# Patient Record
Sex: Female | Born: 1940
Health system: Southern US, Community
[De-identification: ages and names within clinical notes are randomized; demographics above are authoritative.]

## PROBLEM LIST (undated history)

## (undated) ENCOUNTER — Emergency Department (HOSPITAL_COMMUNITY): Admission: EM | Payer: Medicare Other | Source: Home / Self Care

## (undated) DIAGNOSIS — M549 Dorsalgia, unspecified: Secondary | ICD-10-CM

## (undated) DIAGNOSIS — R002 Palpitations: Secondary | ICD-10-CM

## (undated) DIAGNOSIS — R42 Dizziness and giddiness: Secondary | ICD-10-CM

## (undated) DIAGNOSIS — E785 Hyperlipidemia, unspecified: Secondary | ICD-10-CM

## (undated) DIAGNOSIS — D649 Anemia, unspecified: Secondary | ICD-10-CM

## (undated) DIAGNOSIS — E119 Type 2 diabetes mellitus without complications: Secondary | ICD-10-CM

## (undated) DIAGNOSIS — M1990 Unspecified osteoarthritis, unspecified site: Secondary | ICD-10-CM

## (undated) DIAGNOSIS — Z9289 Personal history of other medical treatment: Secondary | ICD-10-CM

## (undated) DIAGNOSIS — I10 Essential (primary) hypertension: Secondary | ICD-10-CM

## (undated) DIAGNOSIS — C9110 Chronic lymphocytic leukemia of B-cell type not having achieved remission: Secondary | ICD-10-CM

## (undated) DIAGNOSIS — Z9889 Other specified postprocedural states: Secondary | ICD-10-CM

## (undated) DIAGNOSIS — Z5189 Encounter for other specified aftercare: Secondary | ICD-10-CM

## (undated) HISTORY — DX: Encounter for other specified aftercare: Z51.89

## (undated) HISTORY — DX: Other specified postprocedural states: Z98.890

## (undated) HISTORY — PX: ABDOMINAL HYSTERECTOMY: SHX81

## (undated) HISTORY — DX: Personal history of other medical treatment: Z92.89

## (undated) HISTORY — DX: Hyperlipidemia, unspecified: E78.5

## (undated) HISTORY — DX: Anemia, unspecified: D64.9

## (undated) HISTORY — DX: Palpitations: R00.2

## (undated) HISTORY — DX: Chronic lymphocytic leukemia of B-cell type not having achieved remission: C91.10

## (undated) HISTORY — PX: COLON SURGERY: SHX602

## (undated) HISTORY — DX: Dizziness and giddiness: R42

---

## 1996-11-11 ENCOUNTER — Encounter (INDEPENDENT_AMBULATORY_CARE_PROVIDER_SITE_OTHER): Payer: Self-pay | Admitting: *Deleted

## 1996-11-11 LAB — CONVERTED CEMR LAB

## 1997-07-21 ENCOUNTER — Encounter: Admission: RE | Admit: 1997-07-21 | Discharge: 1997-07-21 | Payer: Self-pay | Admitting: Sports Medicine

## 1997-08-03 ENCOUNTER — Encounter: Admission: RE | Admit: 1997-08-03 | Discharge: 1997-08-03 | Payer: Self-pay | Admitting: Family Medicine

## 1997-09-30 ENCOUNTER — Encounter: Admission: RE | Admit: 1997-09-30 | Discharge: 1997-09-30 | Payer: Self-pay | Admitting: Family Medicine

## 1997-10-31 ENCOUNTER — Encounter: Admission: RE | Admit: 1997-10-31 | Discharge: 1997-10-31 | Payer: Self-pay | Admitting: Family Medicine

## 1997-11-29 ENCOUNTER — Encounter: Admission: RE | Admit: 1997-11-29 | Discharge: 1997-11-29 | Payer: Self-pay | Admitting: Sports Medicine

## 1997-12-09 ENCOUNTER — Encounter: Admission: RE | Admit: 1997-12-09 | Discharge: 1997-12-09 | Payer: Self-pay | Admitting: Family Medicine

## 1997-12-19 ENCOUNTER — Encounter: Admission: RE | Admit: 1997-12-19 | Discharge: 1997-12-19 | Payer: Self-pay | Admitting: Family Medicine

## 1998-01-12 ENCOUNTER — Encounter: Admission: RE | Admit: 1998-01-12 | Discharge: 1998-01-12 | Payer: Self-pay | Admitting: Family Medicine

## 1998-01-23 ENCOUNTER — Encounter: Admission: RE | Admit: 1998-01-23 | Discharge: 1998-01-23 | Payer: Self-pay | Admitting: Sports Medicine

## 1998-03-21 ENCOUNTER — Encounter: Admission: RE | Admit: 1998-03-21 | Discharge: 1998-03-21 | Payer: Self-pay | Admitting: Sports Medicine

## 1998-03-24 ENCOUNTER — Encounter: Admission: RE | Admit: 1998-03-24 | Discharge: 1998-03-24 | Payer: Self-pay | Admitting: Family Medicine

## 1998-04-11 ENCOUNTER — Ambulatory Visit (HOSPITAL_COMMUNITY): Admission: RE | Admit: 1998-04-11 | Discharge: 1998-04-11 | Payer: Self-pay | Admitting: Sports Medicine

## 1998-04-11 ENCOUNTER — Encounter: Admission: RE | Admit: 1998-04-11 | Discharge: 1998-04-11 | Payer: Self-pay | Admitting: Sports Medicine

## 1998-07-07 ENCOUNTER — Encounter: Admission: RE | Admit: 1998-07-07 | Discharge: 1998-07-07 | Payer: Self-pay | Admitting: Family Medicine

## 1998-07-25 ENCOUNTER — Encounter: Admission: RE | Admit: 1998-07-25 | Discharge: 1998-07-25 | Payer: Self-pay | Admitting: Family Medicine

## 1998-07-27 ENCOUNTER — Encounter: Admission: RE | Admit: 1998-07-27 | Discharge: 1998-07-27 | Payer: Self-pay | Admitting: Family Medicine

## 1998-08-10 ENCOUNTER — Encounter: Admission: RE | Admit: 1998-08-10 | Discharge: 1998-08-10 | Payer: Self-pay | Admitting: Family Medicine

## 1998-11-02 ENCOUNTER — Encounter: Admission: RE | Admit: 1998-11-02 | Discharge: 1998-11-02 | Payer: Self-pay | Admitting: Family Medicine

## 1999-01-09 ENCOUNTER — Encounter: Payer: Self-pay | Admitting: Emergency Medicine

## 1999-01-09 ENCOUNTER — Emergency Department (HOSPITAL_COMMUNITY): Admission: EM | Admit: 1999-01-09 | Discharge: 1999-01-09 | Payer: Self-pay | Admitting: Emergency Medicine

## 1999-01-12 ENCOUNTER — Emergency Department (HOSPITAL_COMMUNITY): Admission: EM | Admit: 1999-01-12 | Discharge: 1999-01-12 | Payer: Self-pay | Admitting: Emergency Medicine

## 1999-01-12 ENCOUNTER — Encounter: Payer: Self-pay | Admitting: Emergency Medicine

## 1999-02-28 ENCOUNTER — Encounter: Payer: Self-pay | Admitting: Emergency Medicine

## 1999-02-28 ENCOUNTER — Emergency Department (HOSPITAL_COMMUNITY): Admission: EM | Admit: 1999-02-28 | Discharge: 1999-02-28 | Payer: Self-pay | Admitting: Emergency Medicine

## 1999-03-02 ENCOUNTER — Encounter: Admission: RE | Admit: 1999-03-02 | Discharge: 1999-03-02 | Payer: Self-pay | Admitting: Family Medicine

## 1999-03-07 ENCOUNTER — Encounter: Admission: RE | Admit: 1999-03-07 | Discharge: 1999-03-07 | Payer: Self-pay | Admitting: Family Medicine

## 1999-03-09 ENCOUNTER — Encounter (INDEPENDENT_AMBULATORY_CARE_PROVIDER_SITE_OTHER): Payer: Self-pay

## 1999-03-09 ENCOUNTER — Ambulatory Visit (HOSPITAL_COMMUNITY): Admission: RE | Admit: 1999-03-09 | Discharge: 1999-03-09 | Payer: Self-pay

## 1999-03-15 ENCOUNTER — Encounter: Admission: RE | Admit: 1999-03-15 | Discharge: 1999-03-15 | Payer: Self-pay | Admitting: Family Medicine

## 1999-04-03 ENCOUNTER — Encounter: Admission: RE | Admit: 1999-04-03 | Discharge: 1999-04-03 | Payer: Self-pay | Admitting: Sports Medicine

## 1999-04-06 ENCOUNTER — Encounter: Admission: RE | Admit: 1999-04-06 | Discharge: 1999-04-06 | Payer: Self-pay | Admitting: Family Medicine

## 1999-04-16 ENCOUNTER — Encounter: Admission: RE | Admit: 1999-04-16 | Discharge: 1999-07-15 | Payer: Self-pay

## 1999-04-23 ENCOUNTER — Encounter: Admission: RE | Admit: 1999-04-23 | Discharge: 1999-04-23 | Payer: Self-pay | Admitting: Family Medicine

## 1999-04-25 ENCOUNTER — Encounter: Admission: RE | Admit: 1999-04-25 | Discharge: 1999-04-25 | Payer: Self-pay | Admitting: Family Medicine

## 1999-04-27 ENCOUNTER — Encounter: Admission: RE | Admit: 1999-04-27 | Discharge: 1999-04-27 | Payer: Self-pay | Admitting: Family Medicine

## 1999-05-08 ENCOUNTER — Encounter: Admission: RE | Admit: 1999-05-08 | Discharge: 1999-05-08 | Payer: Self-pay | Admitting: Sports Medicine

## 1999-05-10 ENCOUNTER — Encounter: Admission: RE | Admit: 1999-05-10 | Discharge: 1999-05-10 | Payer: Self-pay | Admitting: Family Medicine

## 1999-05-29 ENCOUNTER — Encounter: Payer: Self-pay | Admitting: Sports Medicine

## 1999-05-29 ENCOUNTER — Encounter: Admission: RE | Admit: 1999-05-29 | Discharge: 1999-05-29 | Payer: Self-pay

## 1999-06-06 ENCOUNTER — Encounter: Admission: RE | Admit: 1999-06-06 | Discharge: 1999-06-06 | Payer: Self-pay | Admitting: Family Medicine

## 1999-06-07 ENCOUNTER — Encounter: Admission: RE | Admit: 1999-06-07 | Discharge: 1999-06-07 | Payer: Self-pay

## 1999-06-07 ENCOUNTER — Encounter: Payer: Self-pay | Admitting: Sports Medicine

## 1999-06-21 ENCOUNTER — Encounter: Admission: RE | Admit: 1999-06-21 | Discharge: 1999-06-21 | Payer: Self-pay | Admitting: Family Medicine

## 1999-07-11 ENCOUNTER — Ambulatory Visit (HOSPITAL_COMMUNITY): Admission: RE | Admit: 1999-07-11 | Discharge: 1999-07-11 | Payer: Self-pay | Admitting: Family Medicine

## 1999-07-30 ENCOUNTER — Inpatient Hospital Stay (HOSPITAL_COMMUNITY): Admission: EM | Admit: 1999-07-30 | Discharge: 1999-08-01 | Payer: Self-pay | Admitting: Emergency Medicine

## 1999-07-30 ENCOUNTER — Encounter: Payer: Self-pay | Admitting: Emergency Medicine

## 1999-07-31 ENCOUNTER — Encounter: Payer: Self-pay | Admitting: Family Medicine

## 1999-08-10 ENCOUNTER — Encounter: Admission: RE | Admit: 1999-08-10 | Discharge: 1999-08-10 | Payer: Self-pay | Admitting: Family Medicine

## 1999-09-21 ENCOUNTER — Encounter: Admission: RE | Admit: 1999-09-21 | Discharge: 1999-09-21 | Payer: Self-pay | Admitting: Family Medicine

## 1999-10-11 ENCOUNTER — Emergency Department (HOSPITAL_COMMUNITY): Admission: EM | Admit: 1999-10-11 | Discharge: 1999-10-11 | Payer: Self-pay | Admitting: Emergency Medicine

## 1999-10-15 ENCOUNTER — Encounter: Admission: RE | Admit: 1999-10-15 | Discharge: 1999-10-15 | Payer: Self-pay | Admitting: Family Medicine

## 1999-11-08 ENCOUNTER — Encounter: Admission: RE | Admit: 1999-11-08 | Discharge: 1999-11-08 | Payer: Self-pay | Admitting: Family Medicine

## 1999-12-19 ENCOUNTER — Emergency Department (HOSPITAL_COMMUNITY): Admission: EM | Admit: 1999-12-19 | Discharge: 1999-12-19 | Payer: Self-pay | Admitting: Emergency Medicine

## 2000-01-10 ENCOUNTER — Encounter: Admission: RE | Admit: 2000-01-10 | Discharge: 2000-01-10 | Payer: Self-pay | Admitting: Family Medicine

## 2000-02-14 ENCOUNTER — Encounter: Admission: RE | Admit: 2000-02-14 | Discharge: 2000-02-14 | Payer: Self-pay | Admitting: Family Medicine

## 2000-03-01 ENCOUNTER — Emergency Department (HOSPITAL_COMMUNITY): Admission: EM | Admit: 2000-03-01 | Discharge: 2000-03-01 | Payer: Self-pay | Admitting: Emergency Medicine

## 2000-03-01 ENCOUNTER — Encounter: Payer: Self-pay | Admitting: Emergency Medicine

## 2000-03-11 ENCOUNTER — Encounter: Admission: RE | Admit: 2000-03-11 | Discharge: 2000-03-11 | Payer: Self-pay | Admitting: Sports Medicine

## 2000-04-22 ENCOUNTER — Encounter: Admission: RE | Admit: 2000-04-22 | Discharge: 2000-04-22 | Payer: Self-pay | Admitting: Family Medicine

## 2000-04-24 ENCOUNTER — Encounter: Admission: RE | Admit: 2000-04-24 | Discharge: 2000-04-24 | Payer: Self-pay | Admitting: Family Medicine

## 2000-06-26 ENCOUNTER — Encounter: Admission: RE | Admit: 2000-06-26 | Discharge: 2000-06-26 | Payer: Self-pay | Admitting: Family Medicine

## 2000-08-25 ENCOUNTER — Encounter: Admission: RE | Admit: 2000-08-25 | Discharge: 2000-08-25 | Payer: Self-pay | Admitting: Family Medicine

## 2000-09-03 ENCOUNTER — Encounter: Admission: RE | Admit: 2000-09-03 | Discharge: 2000-09-03 | Payer: Self-pay | Admitting: Family Medicine

## 2000-09-08 ENCOUNTER — Encounter: Admission: RE | Admit: 2000-09-08 | Discharge: 2000-09-08 | Payer: Self-pay | Admitting: Family Medicine

## 2000-11-28 ENCOUNTER — Emergency Department (HOSPITAL_COMMUNITY): Admission: EM | Admit: 2000-11-28 | Discharge: 2000-11-29 | Payer: Self-pay | Admitting: Emergency Medicine

## 2000-12-02 ENCOUNTER — Encounter: Admission: RE | Admit: 2000-12-02 | Discharge: 2000-12-02 | Payer: Self-pay | Admitting: Sports Medicine

## 2000-12-26 ENCOUNTER — Encounter: Admission: RE | Admit: 2000-12-26 | Discharge: 2000-12-26 | Payer: Self-pay | Admitting: Family Medicine

## 2001-02-13 ENCOUNTER — Encounter: Admission: RE | Admit: 2001-02-13 | Discharge: 2001-02-13 | Payer: Self-pay | Admitting: Family Medicine

## 2001-04-05 ENCOUNTER — Emergency Department (HOSPITAL_COMMUNITY): Admission: EM | Admit: 2001-04-05 | Discharge: 2001-04-05 | Payer: Self-pay

## 2001-04-21 ENCOUNTER — Encounter: Admission: RE | Admit: 2001-04-21 | Discharge: 2001-04-21 | Payer: Self-pay | Admitting: Family Medicine

## 2001-04-27 ENCOUNTER — Encounter: Admission: RE | Admit: 2001-04-27 | Discharge: 2001-04-27 | Payer: Self-pay | Admitting: Sports Medicine

## 2001-05-31 ENCOUNTER — Emergency Department (HOSPITAL_COMMUNITY): Admission: EM | Admit: 2001-05-31 | Discharge: 2001-06-01 | Payer: Self-pay

## 2001-06-08 ENCOUNTER — Encounter: Admission: RE | Admit: 2001-06-08 | Discharge: 2001-06-08 | Payer: Self-pay | Admitting: Family Medicine

## 2001-06-15 ENCOUNTER — Encounter: Payer: Self-pay | Admitting: Sports Medicine

## 2001-06-15 ENCOUNTER — Encounter: Admission: RE | Admit: 2001-06-15 | Discharge: 2001-06-15 | Payer: Self-pay | Admitting: Sports Medicine

## 2001-08-11 ENCOUNTER — Encounter: Admission: RE | Admit: 2001-08-11 | Discharge: 2001-08-11 | Payer: Self-pay | Admitting: Family Medicine

## 2001-09-18 ENCOUNTER — Encounter: Admission: RE | Admit: 2001-09-18 | Discharge: 2001-09-18 | Payer: Self-pay | Admitting: Family Medicine

## 2001-12-02 ENCOUNTER — Encounter: Admission: RE | Admit: 2001-12-02 | Discharge: 2001-12-02 | Payer: Self-pay | Admitting: Family Medicine

## 2001-12-11 ENCOUNTER — Encounter: Admission: RE | Admit: 2001-12-11 | Discharge: 2001-12-11 | Payer: Self-pay | Admitting: Family Medicine

## 2001-12-25 ENCOUNTER — Encounter: Admission: RE | Admit: 2001-12-25 | Discharge: 2001-12-25 | Payer: Self-pay | Admitting: Family Medicine

## 2002-02-18 ENCOUNTER — Encounter: Admission: RE | Admit: 2002-02-18 | Discharge: 2002-02-18 | Payer: Self-pay | Admitting: Family Medicine

## 2002-03-05 ENCOUNTER — Encounter: Admission: RE | Admit: 2002-03-05 | Discharge: 2002-03-05 | Payer: Self-pay | Admitting: Family Medicine

## 2002-03-10 ENCOUNTER — Encounter: Admission: RE | Admit: 2002-03-10 | Discharge: 2002-03-10 | Payer: Self-pay | Admitting: Family Medicine

## 2002-05-04 ENCOUNTER — Encounter: Admission: RE | Admit: 2002-05-04 | Discharge: 2002-05-04 | Payer: Self-pay | Admitting: Sports Medicine

## 2002-05-21 ENCOUNTER — Emergency Department (HOSPITAL_COMMUNITY): Admission: EM | Admit: 2002-05-21 | Discharge: 2002-05-21 | Payer: Self-pay | Admitting: Emergency Medicine

## 2002-05-21 ENCOUNTER — Encounter: Payer: Self-pay | Admitting: Emergency Medicine

## 2002-06-04 ENCOUNTER — Encounter: Admission: RE | Admit: 2002-06-04 | Discharge: 2002-06-04 | Payer: Self-pay | Admitting: Family Medicine

## 2002-06-06 ENCOUNTER — Emergency Department (HOSPITAL_COMMUNITY): Admission: EM | Admit: 2002-06-06 | Discharge: 2002-06-06 | Payer: Self-pay

## 2002-07-16 ENCOUNTER — Encounter: Admission: RE | Admit: 2002-07-16 | Discharge: 2002-07-16 | Payer: Self-pay | Admitting: Family Medicine

## 2002-08-05 ENCOUNTER — Encounter: Admission: RE | Admit: 2002-08-05 | Discharge: 2002-08-05 | Payer: Self-pay | Admitting: Sports Medicine

## 2002-09-10 ENCOUNTER — Encounter: Payer: Self-pay | Admitting: Family Medicine

## 2002-09-10 ENCOUNTER — Encounter: Admission: RE | Admit: 2002-09-10 | Discharge: 2002-09-10 | Payer: Self-pay | Admitting: Family Medicine

## 2002-09-10 ENCOUNTER — Ambulatory Visit (HOSPITAL_COMMUNITY): Admission: RE | Admit: 2002-09-10 | Discharge: 2002-09-10 | Payer: Self-pay | Admitting: Family Medicine

## 2002-09-30 ENCOUNTER — Encounter: Admission: RE | Admit: 2002-09-30 | Discharge: 2002-09-30 | Payer: Self-pay | Admitting: Family Medicine

## 2002-10-20 ENCOUNTER — Encounter: Admission: RE | Admit: 2002-10-20 | Discharge: 2002-10-20 | Payer: Self-pay | Admitting: Sports Medicine

## 2003-01-19 ENCOUNTER — Encounter: Admission: RE | Admit: 2003-01-19 | Discharge: 2003-01-19 | Payer: Self-pay | Admitting: Family Medicine

## 2003-01-19 ENCOUNTER — Encounter: Admission: RE | Admit: 2003-01-19 | Discharge: 2003-01-19 | Payer: Self-pay | Admitting: Sports Medicine

## 2003-02-02 ENCOUNTER — Encounter: Admission: RE | Admit: 2003-02-02 | Discharge: 2003-02-02 | Payer: Self-pay | Admitting: Family Medicine

## 2003-02-04 ENCOUNTER — Emergency Department (HOSPITAL_COMMUNITY): Admission: EM | Admit: 2003-02-04 | Discharge: 2003-02-04 | Payer: Self-pay | Admitting: Emergency Medicine

## 2003-02-24 ENCOUNTER — Encounter: Admission: RE | Admit: 2003-02-24 | Discharge: 2003-02-24 | Payer: Self-pay | Admitting: Family Medicine

## 2003-02-28 ENCOUNTER — Encounter: Admission: RE | Admit: 2003-02-28 | Discharge: 2003-02-28 | Payer: Self-pay | Admitting: Family Medicine

## 2003-03-09 ENCOUNTER — Encounter: Admission: RE | Admit: 2003-03-09 | Discharge: 2003-03-09 | Payer: Self-pay | Admitting: Sports Medicine

## 2003-03-25 ENCOUNTER — Ambulatory Visit (HOSPITAL_COMMUNITY): Admission: RE | Admit: 2003-03-25 | Discharge: 2003-03-25 | Payer: Self-pay | Admitting: Family Medicine

## 2003-03-25 ENCOUNTER — Encounter: Admission: RE | Admit: 2003-03-25 | Discharge: 2003-03-25 | Payer: Self-pay | Admitting: Family Medicine

## 2003-04-01 ENCOUNTER — Encounter: Admission: RE | Admit: 2003-04-01 | Discharge: 2003-04-01 | Payer: Self-pay | Admitting: Family Medicine

## 2003-05-10 ENCOUNTER — Encounter: Admission: RE | Admit: 2003-05-10 | Discharge: 2003-05-10 | Payer: Self-pay | Admitting: Sports Medicine

## 2003-05-12 ENCOUNTER — Encounter: Admission: RE | Admit: 2003-05-12 | Discharge: 2003-05-12 | Payer: Self-pay | Admitting: Family Medicine

## 2003-05-20 ENCOUNTER — Encounter: Admission: RE | Admit: 2003-05-20 | Discharge: 2003-05-20 | Payer: Self-pay | Admitting: Sports Medicine

## 2003-05-24 ENCOUNTER — Encounter: Admission: RE | Admit: 2003-05-24 | Discharge: 2003-05-24 | Payer: Self-pay | Admitting: Family Medicine

## 2003-06-04 ENCOUNTER — Emergency Department (HOSPITAL_COMMUNITY): Admission: EM | Admit: 2003-06-04 | Discharge: 2003-06-04 | Payer: Self-pay | Admitting: Emergency Medicine

## 2003-07-12 ENCOUNTER — Encounter: Admission: RE | Admit: 2003-07-12 | Discharge: 2003-07-12 | Payer: Self-pay | Admitting: Sports Medicine

## 2003-08-23 ENCOUNTER — Emergency Department (HOSPITAL_COMMUNITY): Admission: EM | Admit: 2003-08-23 | Discharge: 2003-08-23 | Payer: Self-pay | Admitting: Emergency Medicine

## 2003-10-17 ENCOUNTER — Ambulatory Visit: Payer: Self-pay | Admitting: Family Medicine

## 2003-10-19 ENCOUNTER — Encounter: Admission: RE | Admit: 2003-10-19 | Discharge: 2003-10-19 | Payer: Self-pay | Admitting: Sports Medicine

## 2003-11-18 ENCOUNTER — Ambulatory Visit: Payer: Self-pay | Admitting: Family Medicine

## 2003-11-29 ENCOUNTER — Ambulatory Visit: Payer: Self-pay | Admitting: Sports Medicine

## 2003-12-19 ENCOUNTER — Ambulatory Visit: Payer: Self-pay | Admitting: Family Medicine

## 2004-01-03 ENCOUNTER — Emergency Department (HOSPITAL_COMMUNITY): Admission: EM | Admit: 2004-01-03 | Discharge: 2004-01-03 | Payer: Self-pay | Admitting: Emergency Medicine

## 2004-01-06 ENCOUNTER — Ambulatory Visit: Payer: Self-pay | Admitting: Family Medicine

## 2004-01-17 ENCOUNTER — Ambulatory Visit: Payer: Self-pay | Admitting: Family Medicine

## 2004-04-16 ENCOUNTER — Ambulatory Visit: Payer: Self-pay | Admitting: Sports Medicine

## 2004-04-23 ENCOUNTER — Ambulatory Visit: Payer: Self-pay | Admitting: Sports Medicine

## 2004-05-25 ENCOUNTER — Ambulatory Visit: Payer: Self-pay | Admitting: Family Medicine

## 2004-06-29 ENCOUNTER — Ambulatory Visit: Payer: Self-pay | Admitting: Family Medicine

## 2004-08-24 ENCOUNTER — Ambulatory Visit: Payer: Self-pay | Admitting: Family Medicine

## 2004-09-24 ENCOUNTER — Ambulatory Visit: Payer: Self-pay | Admitting: Sports Medicine

## 2004-11-02 ENCOUNTER — Ambulatory Visit: Payer: Self-pay | Admitting: Family Medicine

## 2005-01-18 ENCOUNTER — Encounter: Admission: RE | Admit: 2005-01-18 | Discharge: 2005-01-18 | Payer: Self-pay | Admitting: Sports Medicine

## 2005-02-07 ENCOUNTER — Emergency Department (HOSPITAL_COMMUNITY): Admission: EM | Admit: 2005-02-07 | Discharge: 2005-02-08 | Payer: Self-pay | Admitting: Emergency Medicine

## 2005-02-22 ENCOUNTER — Ambulatory Visit: Payer: Self-pay | Admitting: Family Medicine

## 2005-03-01 ENCOUNTER — Ambulatory Visit: Payer: Self-pay | Admitting: Family Medicine

## 2005-03-08 ENCOUNTER — Ambulatory Visit: Payer: Self-pay | Admitting: Family Medicine

## 2005-04-11 ENCOUNTER — Ambulatory Visit: Payer: Self-pay | Admitting: Family Medicine

## 2005-05-21 ENCOUNTER — Ambulatory Visit: Payer: Self-pay | Admitting: Sports Medicine

## 2005-06-20 ENCOUNTER — Ambulatory Visit: Payer: Self-pay | Admitting: Family Medicine

## 2005-08-23 ENCOUNTER — Ambulatory Visit: Payer: Self-pay | Admitting: Family Medicine

## 2005-10-15 ENCOUNTER — Ambulatory Visit: Payer: Self-pay | Admitting: Family Medicine

## 2005-11-01 ENCOUNTER — Ambulatory Visit: Payer: Self-pay | Admitting: Family Medicine

## 2005-11-04 ENCOUNTER — Emergency Department (HOSPITAL_COMMUNITY): Admission: EM | Admit: 2005-11-04 | Discharge: 2005-11-04 | Payer: Self-pay | Admitting: Emergency Medicine

## 2005-11-18 ENCOUNTER — Ambulatory Visit: Payer: Self-pay | Admitting: Family Medicine

## 2005-12-16 ENCOUNTER — Ambulatory Visit: Payer: Self-pay | Admitting: Sports Medicine

## 2006-02-17 ENCOUNTER — Encounter: Payer: Self-pay | Admitting: Family Medicine

## 2006-02-17 ENCOUNTER — Ambulatory Visit: Payer: Self-pay | Admitting: Family Medicine

## 2006-02-17 LAB — CONVERTED CEMR LAB
ALT: 13 units/L (ref 0–35)
CO2: 28 meq/L (ref 19–32)
Calcium: 9.9 mg/dL (ref 8.4–10.5)
Chloride: 103 meq/L (ref 96–112)
Cholesterol: 157 mg/dL (ref 0–200)
Creatinine, Ser: 0.85 mg/dL (ref 0.40–1.20)
Glucose, Bld: 98 mg/dL (ref 70–99)
Total Bilirubin: 0.5 mg/dL (ref 0.3–1.2)
Total CHOL/HDL Ratio: 3.4
Triglycerides: 146 mg/dL (ref <150)
VLDL: 29 mg/dL (ref 0–40)

## 2006-02-21 ENCOUNTER — Encounter: Admission: RE | Admit: 2006-02-21 | Discharge: 2006-02-21 | Payer: Self-pay | Admitting: Sports Medicine

## 2006-03-14 ENCOUNTER — Encounter: Admission: RE | Admit: 2006-03-14 | Discharge: 2006-03-14 | Payer: Self-pay | Admitting: Sports Medicine

## 2006-03-14 ENCOUNTER — Ambulatory Visit: Payer: Self-pay | Admitting: Family Medicine

## 2006-03-14 ENCOUNTER — Encounter: Payer: Self-pay | Admitting: Family Medicine

## 2006-03-14 LAB — CONVERTED CEMR LAB
Albumin: 4.1 g/dL (ref 3.5–5.2)
Alkaline Phosphatase: 64 units/L (ref 39–117)
BUN: 14 mg/dL (ref 6–23)
Calcium: 10.1 mg/dL (ref 8.4–10.5)
Chloride: 103 meq/L (ref 96–112)
Creatinine, Ser: 0.85 mg/dL (ref 0.40–1.20)
Glucose, Bld: 96 mg/dL (ref 70–99)
Potassium: 3.9 meq/L (ref 3.5–5.3)

## 2006-03-24 ENCOUNTER — Ambulatory Visit: Payer: Self-pay | Admitting: Family Medicine

## 2006-04-03 DIAGNOSIS — R002 Palpitations: Secondary | ICD-10-CM | POA: Insufficient documentation

## 2006-04-03 DIAGNOSIS — I10 Essential (primary) hypertension: Secondary | ICD-10-CM | POA: Insufficient documentation

## 2006-04-03 DIAGNOSIS — E785 Hyperlipidemia, unspecified: Secondary | ICD-10-CM

## 2006-04-03 DIAGNOSIS — E669 Obesity, unspecified: Secondary | ICD-10-CM | POA: Insufficient documentation

## 2006-04-03 DIAGNOSIS — E113299 Type 2 diabetes mellitus with mild nonproliferative diabetic retinopathy without macular edema, unspecified eye: Secondary | ICD-10-CM | POA: Insufficient documentation

## 2006-04-03 DIAGNOSIS — D509 Iron deficiency anemia, unspecified: Secondary | ICD-10-CM

## 2006-04-03 DIAGNOSIS — M1990 Unspecified osteoarthritis, unspecified site: Secondary | ICD-10-CM

## 2006-04-03 DIAGNOSIS — E1169 Type 2 diabetes mellitus with other specified complication: Secondary | ICD-10-CM | POA: Insufficient documentation

## 2006-04-03 DIAGNOSIS — R809 Proteinuria, unspecified: Secondary | ICD-10-CM

## 2006-04-03 DIAGNOSIS — I059 Rheumatic mitral valve disease, unspecified: Secondary | ICD-10-CM | POA: Insufficient documentation

## 2006-04-03 DIAGNOSIS — K5732 Diverticulitis of large intestine without perforation or abscess without bleeding: Secondary | ICD-10-CM

## 2006-04-03 DIAGNOSIS — K279 Peptic ulcer, site unspecified, unspecified as acute or chronic, without hemorrhage or perforation: Secondary | ICD-10-CM | POA: Insufficient documentation

## 2006-04-04 ENCOUNTER — Encounter (INDEPENDENT_AMBULATORY_CARE_PROVIDER_SITE_OTHER): Payer: Self-pay | Admitting: *Deleted

## 2006-04-22 ENCOUNTER — Telehealth: Payer: Self-pay | Admitting: *Deleted

## 2006-05-07 ENCOUNTER — Telehealth: Payer: Self-pay | Admitting: *Deleted

## 2006-05-22 ENCOUNTER — Ambulatory Visit: Payer: Self-pay | Admitting: Family Medicine

## 2006-05-22 LAB — CONVERTED CEMR LAB: Hgb A1c MFr Bld: 6.3 %

## 2006-06-05 ENCOUNTER — Telehealth: Payer: Self-pay | Admitting: *Deleted

## 2006-06-05 ENCOUNTER — Ambulatory Visit (HOSPITAL_COMMUNITY): Admission: RE | Admit: 2006-06-05 | Discharge: 2006-06-05 | Payer: Self-pay | Admitting: Family Medicine

## 2006-06-05 ENCOUNTER — Ambulatory Visit: Payer: Self-pay | Admitting: Sports Medicine

## 2006-06-05 ENCOUNTER — Encounter: Payer: Self-pay | Admitting: Family Medicine

## 2006-06-05 DIAGNOSIS — K219 Gastro-esophageal reflux disease without esophagitis: Secondary | ICD-10-CM | POA: Insufficient documentation

## 2006-06-12 ENCOUNTER — Encounter: Payer: Self-pay | Admitting: Family Medicine

## 2006-08-04 ENCOUNTER — Telehealth (INDEPENDENT_AMBULATORY_CARE_PROVIDER_SITE_OTHER): Payer: Self-pay | Admitting: *Deleted

## 2006-08-05 ENCOUNTER — Encounter: Payer: Self-pay | Admitting: Family Medicine

## 2006-08-05 ENCOUNTER — Ambulatory Visit: Payer: Self-pay | Admitting: Family Medicine

## 2006-08-05 LAB — CONVERTED CEMR LAB
Calcium: 10 mg/dL (ref 8.4–10.5)
Creatinine, Ser: 0.89 mg/dL (ref 0.40–1.20)
Sodium: 142 meq/L (ref 135–145)

## 2006-08-20 ENCOUNTER — Telehealth (INDEPENDENT_AMBULATORY_CARE_PROVIDER_SITE_OTHER): Payer: Self-pay | Admitting: Family Medicine

## 2006-08-24 ENCOUNTER — Emergency Department (HOSPITAL_COMMUNITY): Admission: EM | Admit: 2006-08-24 | Discharge: 2006-08-24 | Payer: Self-pay | Admitting: Emergency Medicine

## 2006-08-25 ENCOUNTER — Telehealth (INDEPENDENT_AMBULATORY_CARE_PROVIDER_SITE_OTHER): Payer: Self-pay | Admitting: *Deleted

## 2006-08-25 ENCOUNTER — Ambulatory Visit: Payer: Self-pay | Admitting: Family Medicine

## 2006-09-15 ENCOUNTER — Telehealth (INDEPENDENT_AMBULATORY_CARE_PROVIDER_SITE_OTHER): Payer: Self-pay | Admitting: Family Medicine

## 2006-09-17 ENCOUNTER — Telehealth: Payer: Self-pay | Admitting: *Deleted

## 2006-09-18 ENCOUNTER — Encounter: Payer: Self-pay | Admitting: Family Medicine

## 2006-09-18 ENCOUNTER — Ambulatory Visit: Payer: Self-pay | Admitting: Family Medicine

## 2006-09-18 LAB — CONVERTED CEMR LAB
ALT: 12 units/L (ref 0–35)
Albumin: 4.1 g/dL (ref 3.5–5.2)
CO2: 25 meq/L (ref 19–32)
Calcium: 9.1 mg/dL (ref 8.4–10.5)
Chloride: 107 meq/L (ref 96–112)
Hemoglobin: 11.1 g/dL — ABNORMAL LOW (ref 12.0–15.0)
Potassium: 4.1 meq/L (ref 3.5–5.3)
RBC: 4.6 M/uL (ref 3.87–5.11)
Sed Rate: 40 mm/hr — ABNORMAL HIGH (ref 0–22)
Sodium: 143 meq/L (ref 135–145)
TSH: 1.955 microintl units/mL (ref 0.350–5.50)
Total Bilirubin: 0.3 mg/dL (ref 0.3–1.2)
Total Protein: 7.2 g/dL (ref 6.0–8.3)
WBC: 5.5 10*3/uL (ref 4.0–10.5)

## 2006-10-27 ENCOUNTER — Ambulatory Visit: Payer: Self-pay | Admitting: Sports Medicine

## 2006-11-13 ENCOUNTER — Ambulatory Visit: Payer: Self-pay | Admitting: Family Medicine

## 2006-11-13 ENCOUNTER — Encounter: Admission: RE | Admit: 2006-11-13 | Discharge: 2006-11-13 | Payer: Self-pay | Admitting: Sports Medicine

## 2006-11-13 ENCOUNTER — Telehealth (INDEPENDENT_AMBULATORY_CARE_PROVIDER_SITE_OTHER): Payer: Self-pay | Admitting: *Deleted

## 2006-12-08 ENCOUNTER — Telehealth: Payer: Self-pay | Admitting: *Deleted

## 2007-01-09 ENCOUNTER — Ambulatory Visit: Payer: Self-pay | Admitting: Family Medicine

## 2007-01-22 ENCOUNTER — Ambulatory Visit: Payer: Self-pay | Admitting: Family Medicine

## 2007-02-20 ENCOUNTER — Telehealth (INDEPENDENT_AMBULATORY_CARE_PROVIDER_SITE_OTHER): Payer: Self-pay | Admitting: Family Medicine

## 2007-03-31 ENCOUNTER — Telehealth: Payer: Self-pay | Admitting: *Deleted

## 2007-04-01 ENCOUNTER — Encounter (INDEPENDENT_AMBULATORY_CARE_PROVIDER_SITE_OTHER): Payer: Self-pay | Admitting: *Deleted

## 2007-04-01 ENCOUNTER — Ambulatory Visit: Payer: Self-pay | Admitting: Family Medicine

## 2007-04-02 ENCOUNTER — Encounter (INDEPENDENT_AMBULATORY_CARE_PROVIDER_SITE_OTHER): Payer: Self-pay | Admitting: *Deleted

## 2007-04-02 LAB — CONVERTED CEMR LAB
ALT: 12 units/L (ref 0–35)
AST: 15 units/L (ref 0–37)
Basophils Absolute: 0 10*3/uL (ref 0.0–0.1)
Basophils Relative: 0 % (ref 0–1)
Calcium: 9.5 mg/dL (ref 8.4–10.5)
Chloride: 104 meq/L (ref 96–112)
Creatinine, Ser: 0.78 mg/dL (ref 0.40–1.20)
Hemoglobin: 10.5 g/dL — ABNORMAL LOW (ref 12.0–15.0)
MCHC: 30.8 g/dL (ref 30.0–36.0)
Monocytes Absolute: 0.6 10*3/uL (ref 0.1–1.0)
Neutro Abs: 2 10*3/uL (ref 1.7–7.7)
Platelets: 347 10*3/uL (ref 150–400)
RDW: 14.7 % (ref 11.5–15.5)
Sodium: 144 meq/L (ref 135–145)
Total Bilirubin: 0.3 mg/dL (ref 0.3–1.2)

## 2007-04-14 ENCOUNTER — Encounter: Admission: RE | Admit: 2007-04-14 | Discharge: 2007-04-14 | Payer: Self-pay | Admitting: *Deleted

## 2007-04-30 ENCOUNTER — Emergency Department (HOSPITAL_COMMUNITY): Admission: EM | Admit: 2007-04-30 | Discharge: 2007-04-30 | Payer: Self-pay | Admitting: Family Medicine

## 2007-05-14 ENCOUNTER — Ambulatory Visit: Payer: Self-pay | Admitting: Family Medicine

## 2007-05-21 ENCOUNTER — Emergency Department (HOSPITAL_COMMUNITY): Admission: EM | Admit: 2007-05-21 | Discharge: 2007-05-21 | Payer: Self-pay | Admitting: Emergency Medicine

## 2007-05-26 ENCOUNTER — Ambulatory Visit: Payer: Self-pay | Admitting: Family Medicine

## 2007-05-26 ENCOUNTER — Encounter (INDEPENDENT_AMBULATORY_CARE_PROVIDER_SITE_OTHER): Payer: Self-pay | Admitting: Family Medicine

## 2007-05-26 DIAGNOSIS — R609 Edema, unspecified: Secondary | ICD-10-CM

## 2007-05-26 LAB — CONVERTED CEMR LAB
Ferritin: 33 ng/mL (ref 10–291)
Folate: 18.4 ng/mL
RBC: 4.65 M/uL (ref 3.87–5.11)
TIBC: 323 ug/dL (ref 250–470)
UIBC: 275 ug/dL
WBC: 5.9 10*3/uL (ref 4.0–10.5)

## 2007-06-08 ENCOUNTER — Encounter (INDEPENDENT_AMBULATORY_CARE_PROVIDER_SITE_OTHER): Payer: Self-pay | Admitting: Family Medicine

## 2007-06-26 ENCOUNTER — Telehealth: Payer: Self-pay | Admitting: *Deleted

## 2007-06-30 ENCOUNTER — Encounter (INDEPENDENT_AMBULATORY_CARE_PROVIDER_SITE_OTHER): Payer: Self-pay | Admitting: Family Medicine

## 2007-06-30 ENCOUNTER — Ambulatory Visit: Payer: Self-pay | Admitting: Family Medicine

## 2007-06-30 DIAGNOSIS — J309 Allergic rhinitis, unspecified: Secondary | ICD-10-CM | POA: Insufficient documentation

## 2007-06-30 LAB — CONVERTED CEMR LAB
Hemoglobin: 10.8 g/dL — ABNORMAL LOW (ref 12.0–15.0)
Lymphocytes Relative: 54 % — ABNORMAL HIGH (ref 12–46)
Monocytes Absolute: 0.5 10*3/uL (ref 0.1–1.0)
Monocytes Relative: 8 % (ref 3–12)
Neutro Abs: 2.1 10*3/uL (ref 1.7–7.7)
RBC: 4.6 M/uL (ref 3.87–5.11)

## 2007-07-16 ENCOUNTER — Ambulatory Visit: Payer: Self-pay | Admitting: Family Medicine

## 2007-07-17 ENCOUNTER — Telehealth (INDEPENDENT_AMBULATORY_CARE_PROVIDER_SITE_OTHER): Payer: Self-pay | Admitting: Family Medicine

## 2007-07-17 ENCOUNTER — Encounter: Admission: RE | Admit: 2007-07-17 | Discharge: 2007-07-17 | Payer: Self-pay | Admitting: Family Medicine

## 2007-07-21 ENCOUNTER — Telehealth (INDEPENDENT_AMBULATORY_CARE_PROVIDER_SITE_OTHER): Payer: Self-pay | Admitting: *Deleted

## 2007-08-07 ENCOUNTER — Emergency Department (HOSPITAL_COMMUNITY): Admission: EM | Admit: 2007-08-07 | Discharge: 2007-08-07 | Payer: Self-pay | Admitting: Emergency Medicine

## 2007-08-17 ENCOUNTER — Telehealth: Payer: Self-pay | Admitting: *Deleted

## 2007-08-18 ENCOUNTER — Encounter: Payer: Self-pay | Admitting: *Deleted

## 2007-09-03 ENCOUNTER — Ambulatory Visit: Payer: Self-pay | Admitting: Family Medicine

## 2007-10-13 ENCOUNTER — Telehealth: Payer: Self-pay | Admitting: *Deleted

## 2007-10-14 ENCOUNTER — Ambulatory Visit: Payer: Self-pay | Admitting: Family Medicine

## 2007-10-14 ENCOUNTER — Encounter: Payer: Self-pay | Admitting: Family Medicine

## 2007-10-14 DIAGNOSIS — R5381 Other malaise: Secondary | ICD-10-CM

## 2007-10-14 DIAGNOSIS — R5383 Other fatigue: Secondary | ICD-10-CM

## 2007-10-14 LAB — CONVERTED CEMR LAB
Albumin: 4.2 g/dL (ref 3.5–5.2)
Alkaline Phosphatase: 64 units/L (ref 39–117)
BUN: 16 mg/dL (ref 6–23)
CO2: 26 meq/L (ref 19–32)
Calcium: 10 mg/dL (ref 8.4–10.5)
Eosinophils Absolute: 0.2 10*3/uL (ref 0.0–0.7)
Glucose, Bld: 123 mg/dL — ABNORMAL HIGH (ref 70–99)
HCT: 36.1 % (ref 36.0–46.0)
Lymphs Abs: 3.4 10*3/uL (ref 0.7–4.0)
MCV: 77.8 fL — ABNORMAL LOW (ref 78.0–100.0)
Monocytes Relative: 9 % (ref 3–12)
Neutrophils Relative %: 33 % — ABNORMAL LOW (ref 43–77)
Potassium: 4 meq/L (ref 3.5–5.3)
RBC: 4.64 M/uL (ref 3.87–5.11)
TSH: 2.2 microintl units/mL (ref 0.350–4.50)
WBC: 6.3 10*3/uL (ref 4.0–10.5)

## 2007-11-16 ENCOUNTER — Telehealth (INDEPENDENT_AMBULATORY_CARE_PROVIDER_SITE_OTHER): Payer: Self-pay | Admitting: *Deleted

## 2007-12-09 ENCOUNTER — Ambulatory Visit: Payer: Self-pay | Admitting: Family Medicine

## 2007-12-25 ENCOUNTER — Ambulatory Visit: Payer: Self-pay | Admitting: Family Medicine

## 2007-12-30 ENCOUNTER — Telehealth: Payer: Self-pay | Admitting: *Deleted

## 2008-01-24 ENCOUNTER — Emergency Department (HOSPITAL_COMMUNITY): Admission: EM | Admit: 2008-01-24 | Discharge: 2008-01-24 | Payer: Self-pay | Admitting: Emergency Medicine

## 2008-01-24 ENCOUNTER — Encounter (INDEPENDENT_AMBULATORY_CARE_PROVIDER_SITE_OTHER): Payer: Self-pay | Admitting: Family Medicine

## 2008-02-11 ENCOUNTER — Ambulatory Visit: Payer: Self-pay | Admitting: Family Medicine

## 2008-02-11 ENCOUNTER — Telehealth: Payer: Self-pay | Admitting: *Deleted

## 2008-03-08 ENCOUNTER — Telehealth: Payer: Self-pay | Admitting: *Deleted

## 2008-05-11 ENCOUNTER — Ambulatory Visit: Payer: Self-pay | Admitting: Family Medicine

## 2008-05-11 ENCOUNTER — Encounter (INDEPENDENT_AMBULATORY_CARE_PROVIDER_SITE_OTHER): Payer: Self-pay | Admitting: Family Medicine

## 2008-05-11 LAB — CONVERTED CEMR LAB
Cholesterol: 149 mg/dL (ref 0–200)
Triglycerides: 116 mg/dL (ref <150)
VLDL: 23 mg/dL (ref 0–40)

## 2008-05-12 ENCOUNTER — Encounter (INDEPENDENT_AMBULATORY_CARE_PROVIDER_SITE_OTHER): Payer: Self-pay | Admitting: Family Medicine

## 2008-05-18 ENCOUNTER — Telehealth (INDEPENDENT_AMBULATORY_CARE_PROVIDER_SITE_OTHER): Payer: Self-pay | Admitting: Family Medicine

## 2008-05-19 ENCOUNTER — Ambulatory Visit: Payer: Self-pay | Admitting: Family Medicine

## 2008-05-19 ENCOUNTER — Encounter (INDEPENDENT_AMBULATORY_CARE_PROVIDER_SITE_OTHER): Payer: Self-pay | Admitting: Family Medicine

## 2008-05-19 LAB — CONVERTED CEMR LAB
AST: 22 units/L (ref 0–37)
Albumin: 3.8 g/dL (ref 3.5–5.2)
Alkaline Phosphatase: 63 units/L (ref 39–117)
BUN: 15 mg/dL (ref 6–23)
Basophils Absolute: 0 10*3/uL (ref 0.0–0.1)
Basophils Relative: 1 % (ref 0–1)
Blood in Urine, dipstick: NEGATIVE
Glucose, Bld: 121 mg/dL — ABNORMAL HIGH (ref 70–99)
Ketones, urine, test strip: NEGATIVE
Lymphocytes Relative: 52 % — ABNORMAL HIGH (ref 12–46)
MCHC: 32.6 g/dL (ref 30.0–36.0)
Neutro Abs: 1.9 10*3/uL (ref 1.7–7.7)
Nitrite: NEGATIVE
Platelets: 318 10*3/uL (ref 150–400)
Potassium: 3.4 meq/L — ABNORMAL LOW (ref 3.5–5.3)
Protein, U semiquant: 30
RDW: 14.3 % (ref 11.5–15.5)
Sodium: 144 meq/L (ref 135–145)
Total Bilirubin: 0.4 mg/dL (ref 0.3–1.2)
Total Protein: 7.4 g/dL (ref 6.0–8.3)
Urobilinogen, UA: 0.2

## 2008-06-20 ENCOUNTER — Telehealth (INDEPENDENT_AMBULATORY_CARE_PROVIDER_SITE_OTHER): Payer: Self-pay | Admitting: Family Medicine

## 2008-06-21 ENCOUNTER — Ambulatory Visit (HOSPITAL_COMMUNITY): Admission: RE | Admit: 2008-06-21 | Discharge: 2008-06-21 | Payer: Self-pay | Admitting: Family Medicine

## 2008-06-21 ENCOUNTER — Telehealth (INDEPENDENT_AMBULATORY_CARE_PROVIDER_SITE_OTHER): Payer: Self-pay | Admitting: Family Medicine

## 2008-06-21 ENCOUNTER — Ambulatory Visit: Payer: Self-pay | Admitting: Family Medicine

## 2008-07-12 ENCOUNTER — Encounter (INDEPENDENT_AMBULATORY_CARE_PROVIDER_SITE_OTHER): Payer: Self-pay | Admitting: Family Medicine

## 2008-08-03 ENCOUNTER — Telehealth: Payer: Self-pay | Admitting: Family Medicine

## 2008-08-04 ENCOUNTER — Ambulatory Visit: Payer: Self-pay | Admitting: Family Medicine

## 2008-08-14 ENCOUNTER — Emergency Department (HOSPITAL_COMMUNITY): Admission: EM | Admit: 2008-08-14 | Discharge: 2008-08-14 | Payer: Self-pay | Admitting: Family Medicine

## 2008-10-21 ENCOUNTER — Ambulatory Visit: Payer: Self-pay | Admitting: Family Medicine

## 2008-11-02 ENCOUNTER — Telehealth: Payer: Self-pay | Admitting: Family Medicine

## 2008-11-10 ENCOUNTER — Encounter: Payer: Self-pay | Admitting: Family Medicine

## 2008-11-29 ENCOUNTER — Ambulatory Visit: Payer: Self-pay | Admitting: Family Medicine

## 2008-11-29 ENCOUNTER — Encounter: Payer: Self-pay | Admitting: Family Medicine

## 2008-11-29 DIAGNOSIS — R1310 Dysphagia, unspecified: Secondary | ICD-10-CM

## 2008-11-29 LAB — CONVERTED CEMR LAB
Hgb A1c MFr Bld: 6.5 %
TSH: 2.843 microintl units/mL (ref 0.350–4.500)

## 2008-11-30 ENCOUNTER — Telehealth: Payer: Self-pay | Admitting: Family Medicine

## 2008-12-08 ENCOUNTER — Ambulatory Visit (HOSPITAL_COMMUNITY): Admission: RE | Admit: 2008-12-08 | Discharge: 2008-12-08 | Payer: Self-pay | Admitting: Family Medicine

## 2009-01-22 ENCOUNTER — Emergency Department (HOSPITAL_COMMUNITY): Admission: EM | Admit: 2009-01-22 | Discharge: 2009-01-22 | Payer: Self-pay | Admitting: Family Medicine

## 2009-02-24 ENCOUNTER — Encounter: Payer: Self-pay | Admitting: Family Medicine

## 2009-02-24 ENCOUNTER — Ambulatory Visit: Payer: Self-pay | Admitting: Family Medicine

## 2009-02-24 DIAGNOSIS — R1011 Right upper quadrant pain: Secondary | ICD-10-CM | POA: Insufficient documentation

## 2009-02-24 LAB — CONVERTED CEMR LAB
AST: 20 units/L (ref 0–37)
Albumin: 4.4 g/dL (ref 3.5–5.2)
BUN: 11 mg/dL (ref 6–23)
CO2: 29 meq/L (ref 19–32)
Calcium: 9.3 mg/dL (ref 8.4–10.5)
Chloride: 102 meq/L (ref 96–112)
Creatinine, Ser: 0.79 mg/dL (ref 0.40–1.20)
Glucose, Bld: 104 mg/dL — ABNORMAL HIGH (ref 70–99)
HCT: 36.2 % (ref 36.0–46.0)
Hemoglobin: 11.3 g/dL — ABNORMAL LOW (ref 12.0–15.0)
Lipase: 34 units/L (ref 0–75)
Potassium: 3.7 meq/L (ref 3.5–5.3)
RBC: 4.58 M/uL (ref 3.87–5.11)
RDW: 15.2 % (ref 11.5–15.5)

## 2009-02-27 ENCOUNTER — Telehealth: Payer: Self-pay | Admitting: Family Medicine

## 2009-03-20 ENCOUNTER — Telehealth: Payer: Self-pay | Admitting: *Deleted

## 2009-04-19 ENCOUNTER — Emergency Department (HOSPITAL_COMMUNITY): Admission: EM | Admit: 2009-04-19 | Discharge: 2009-04-19 | Payer: Self-pay | Admitting: Emergency Medicine

## 2009-05-01 ENCOUNTER — Telehealth: Payer: Self-pay | Admitting: *Deleted

## 2009-05-03 ENCOUNTER — Encounter: Payer: Self-pay | Admitting: Family Medicine

## 2009-05-03 ENCOUNTER — Ambulatory Visit: Payer: Self-pay | Admitting: Family Medicine

## 2009-05-03 DIAGNOSIS — R51 Headache: Secondary | ICD-10-CM | POA: Insufficient documentation

## 2009-05-03 DIAGNOSIS — K130 Diseases of lips: Secondary | ICD-10-CM | POA: Insufficient documentation

## 2009-05-03 DIAGNOSIS — R519 Headache, unspecified: Secondary | ICD-10-CM | POA: Insufficient documentation

## 2009-05-03 LAB — CONVERTED CEMR LAB
Direct LDL: 98 mg/dL
Hgb A1c MFr Bld: 6.5 %

## 2009-05-13 ENCOUNTER — Telehealth: Payer: Self-pay | Admitting: Sports Medicine

## 2009-06-21 ENCOUNTER — Ambulatory Visit: Payer: Self-pay | Admitting: Family Medicine

## 2009-06-21 DIAGNOSIS — N951 Menopausal and female climacteric states: Secondary | ICD-10-CM | POA: Insufficient documentation

## 2009-06-21 DIAGNOSIS — R61 Generalized hyperhidrosis: Secondary | ICD-10-CM | POA: Insufficient documentation

## 2009-06-21 DIAGNOSIS — R05 Cough: Secondary | ICD-10-CM | POA: Insufficient documentation

## 2009-06-21 DIAGNOSIS — R52 Pain, unspecified: Secondary | ICD-10-CM | POA: Insufficient documentation

## 2009-06-21 DIAGNOSIS — R059 Cough, unspecified: Secondary | ICD-10-CM | POA: Insufficient documentation

## 2009-06-22 ENCOUNTER — Telehealth: Payer: Self-pay | Admitting: Family Medicine

## 2009-07-20 ENCOUNTER — Ambulatory Visit: Payer: Self-pay | Admitting: Family Medicine

## 2009-07-20 ENCOUNTER — Ambulatory Visit (HOSPITAL_COMMUNITY): Admission: RE | Admit: 2009-07-20 | Discharge: 2009-07-20 | Payer: Self-pay | Admitting: Family Medicine

## 2009-07-20 DIAGNOSIS — M549 Dorsalgia, unspecified: Secondary | ICD-10-CM | POA: Insufficient documentation

## 2009-07-26 ENCOUNTER — Encounter: Payer: Self-pay | Admitting: Family Medicine

## 2009-08-25 ENCOUNTER — Ambulatory Visit: Payer: Self-pay | Admitting: Family Medicine

## 2009-08-25 LAB — CONVERTED CEMR LAB
Blood in Urine, dipstick: NEGATIVE
Glucose, Urine, Semiquant: NEGATIVE
Nitrite: NEGATIVE
Specific Gravity, Urine: 1.02
WBC Urine, dipstick: NEGATIVE
pH: 6

## 2009-09-29 ENCOUNTER — Telehealth: Payer: Self-pay | Admitting: *Deleted

## 2009-10-20 ENCOUNTER — Ambulatory Visit: Payer: Self-pay | Admitting: Family Medicine

## 2009-11-02 ENCOUNTER — Telehealth: Payer: Self-pay | Admitting: *Deleted

## 2009-11-06 ENCOUNTER — Telehealth: Payer: Self-pay | Admitting: Family Medicine

## 2009-11-21 ENCOUNTER — Ambulatory Visit: Payer: Self-pay | Admitting: Family Medicine

## 2009-11-21 ENCOUNTER — Encounter: Payer: Self-pay | Admitting: Family Medicine

## 2009-11-21 LAB — CONVERTED CEMR LAB
Hemoglobin: 11.5 g/dL — ABNORMAL LOW (ref 12.0–15.0)
MCV: 79.3 fL (ref 78.0–100.0)
RBC: 4.69 M/uL (ref 3.87–5.11)
TSH: 3.486 microintl units/mL (ref 0.350–4.500)
WBC: 9 10*3/uL (ref 4.0–10.5)

## 2009-11-24 ENCOUNTER — Encounter: Payer: Self-pay | Admitting: Family Medicine

## 2009-12-14 ENCOUNTER — Ambulatory Visit: Payer: Self-pay | Admitting: Family Medicine

## 2009-12-25 ENCOUNTER — Encounter: Payer: Self-pay | Admitting: Family Medicine

## 2010-01-01 ENCOUNTER — Ambulatory Visit: Payer: Self-pay | Admitting: Family Medicine

## 2010-01-09 ENCOUNTER — Telehealth: Payer: Self-pay | Admitting: *Deleted

## 2010-01-22 ENCOUNTER — Ambulatory Visit: Payer: Self-pay | Admitting: Family Medicine

## 2010-02-13 ENCOUNTER — Ambulatory Visit
Admission: RE | Admit: 2010-02-13 | Discharge: 2010-02-13 | Payer: Self-pay | Source: Home / Self Care | Attending: Family Medicine | Admitting: Family Medicine

## 2010-02-13 DIAGNOSIS — R42 Dizziness and giddiness: Secondary | ICD-10-CM | POA: Insufficient documentation

## 2010-02-14 ENCOUNTER — Telehealth: Payer: Self-pay | Admitting: Family Medicine

## 2010-02-25 ENCOUNTER — Encounter: Payer: Self-pay | Admitting: Sports Medicine

## 2010-02-25 ENCOUNTER — Encounter: Payer: Self-pay | Admitting: Family Medicine

## 2010-03-06 NOTE — Progress Notes (Signed)
Summary: Emergency Line Call  Phone Note Call from Patient Call back at Home Phone [phone]   Caller: Patient Summary of Call: HA for almost one day, temporal, no trauma, seen in ED recently for this same complaints CT and other workup negative and sent home, seen at [facility] for this complaint, Rx;ed NSAIDS.  Pt has not taken any meds yet for this.  No N/V, no visual changes, no numbness/weakness.  Advised ibuprofen 600mg  q4-6h, hydration, make SDA appt Monday morning as this was not life threatening.  She will do her best to get through the weekend.  Will call [facility] Monday morning for SDA. Initial call taken by: Rodney Langton MD,  May 13, 2009 8:53 PM

## 2010-03-06 NOTE — Miscellaneous (Signed)
Summary: Orders Update  Clinical Lists Changes  Problems: Added new problem of LONG-TERM (CURRENT) USE OF OTHER MEDICATIONS (ICD-V58.69) Orders: Added new Test order of B12-[facility] [phone]) - Signed Added new Test order of CBC-[facility] (14782) - Signed

## 2010-03-06 NOTE — Progress Notes (Signed)
Summary: refill  Phone Note Refill Request Call back at Home Phone [phone] Message from:  Patient  Refills Requested: Medication #1:  GLUCOPHAGE 500 MG TABS Take 1 tablet by mouth twice a day [facility] [address]  pt has been out since last thursday  Initial call taken by: De Nurse,  March 20, 2009 8:55 AM  Follow-up for Phone Call        pt notified that it has been refilled Follow-up by: Golden Circle RN,  March 20, 2009 9:15 AM    Prescriptions: GLUCOPHAGE 500 MG TABS (METFORMIN HCL) Take 1 tablet by mouth twice a day  #62 x 6   Entered by:   Golden Circle RN   Authorized by:   Magnus Ivan MD   Signed by:   Golden Circle RN on 03/20/2009   Method used:   Electronically to        [facility] [phone]* (retail)       [address]       [place], [place]  [address]       Ph: [phone]       Fax: [phone]   RxID:   [id number]

## 2010-03-06 NOTE — Progress Notes (Signed)
Summary: phone call to patient about cpe and x ray.  Phone Note Outgoing Call   Call placed by: Magnus Ivan MD,  Jun 22, 2009 9:25 AM Call placed to: Patient Summary of Call: Called patient to talk to her about getting a complete physical exam (as that was indicated for reason for coming to appt; however, patient had other problems as reported in clinical encounter).  There was no response upon calling and patient did not have an answering machine.  Will call later or will mail letter.  Follow-up for Phone Call        Also attempted to call about chest xray that Md would like for her to have done.  She may come pick up order and go to office of her choice ([place] [facility] or [facility].)  No answer and no machine. Placed order and map to [facility] imaging up front Follow-up by: Jone Baseman CMA,  Jun 22, 2009 11:12 AM  Additional Follow-up for Phone Call Additional follow up Details #1::        Called patient.  She did want a physical exam as noted by the nurses on initial intake information.  During phone call patient confirmed that she did want a complete physical exam.  Will do one at next visit.  Also, informed patient of x ray information at the front desk of the [facility].  She said that she will come by on Monday.  If there is any problem please page me at [phone]. Additional Follow-up by: Magnus Ivan MD,  Jun 23, 2009 3:42 PM

## 2010-03-06 NOTE — Assessment & Plan Note (Signed)
Summary: ?pnuemonia   Vital Signs:  Patient profile:   70 year old female Weight:      199.7 pounds O2 Sat:      98 % on Room air Temp:     97.4 degrees F oral Pulse rate:   59 / minute Pulse rhythm:   regular BP sitting:   119 / 72  (left arm) Cuff size:   large  Vitals Entered By: Loralee Pacas CMA (August 25, 2009 1:46 PM)  O2 Flow:  Room air Comments pt states that she is hurting in her back off and on for a while has had x rays but doesn't understand why she is still hurting like this. no problems breathing   Primary Care Provider:  Magnus Ivan MD   History of Present Illness: Right sided lower chest upper back pain.  Similar pain to being seen one month ago.  Worried about pneumonia.  No fever, minimal cough.  No SOB.  No urinary symptoms. no abd pain.  No systemic symptoms  She moved 2 weeks ago and for the past month has been packing and unpacking boxes.  Current Medications (verified): 1)  Glucophage 500 Mg Tabs (Metformin Hcl) .... Take 1 Tablet By Mouth Twice A Day 2)  Hyzaar 100-25 Mg  Tabs (Losartan Potassium-Hctz) .Marland Kitchen.. 1 Tab By Mouth Daily 3)  Metoprolol Tartrate 50 Mg  Tabs (Metoprolol Tartrate) .Marland Kitchen.. 1 Tab By Mouth Bid 4)  Aspirin Buffered 325 Mg  Tabs (Aspirin Buff(Mgcarb-Alaminoac)) .Marland Kitchen.. 1 Tab By Mouth Daily 5)  Zocor 40 Mg  Tabs (Simvastatin) .Marland Kitchen.. 1 Tab By Mouth At Bedtime 6)  Accucheck Test Strips .... Use To Test Blood Sugar Twice Daily 7)  Nexium 40 Mg Cpdr (Esomeprazole Magnesium) .... Take 1 Tab Each Morning Failed Prilosec.  H/o Pud (533.90) 8)  Citalopram Hydrobromide 20 Mg Tabs (Citalopram Hydrobromide) .... Take 1 By Mouth Daily To Help With Hot Flashes. 9)  Cyclobenzaprine Hcl 5 Mg Tabs (Cyclobenzaprine Hcl) .Marland Kitchen.. 1-2 By Mouth As Needed Up To Three Times A Day For Muscle Pain 10)  Ibuprofen 400 Mg Tabs (Ibuprofen) .... One By Mouth Three Times A Day As Needed Back Pain.  Take With Food.  Allergies (verified): 1)  ! Penicillin 2)  ! Codeine 3)   ! Sulfa 4)  Amoxicillin (Amoxicillin) 5)  Codeine Phosphate (Codeine Phosphate) 6)  Sulfamethoxazole (Sulfamethoxazole)  Past History:  Past medical, surgical, family and social histories (including risk factors) reviewed, and no changes noted (except as noted below).  Past Medical History: Reviewed history from 10/23/2008 and no changes required. allergic rhinitis, htn h/o temporal arteritis at age 77 and pt recently had a fall on 08/14/2008 (updated on 10/21/2008)  Past Surgical History: Reviewed history from 08/04/2008 and no changes required. not reviewed on this appointment  Family History: Reviewed history from 10/21/2008 and no changes required. 2 living brothers 1? with htn 5 living sisters: 2 with diabetes, all with htn F deceased @ 66, MI M deceased @ 69, unknown cancer htn, diabetes children: 2 sons with diabetes and heart problems, 2 sons with htn, and daughter with htn and borderline  3 sisters died with pancreatic cancer (updated 10/21/2008)  Social History: Reviewed history from 08/04/2008 and no changes required. No tob, EtOH, drugs.  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  5 children, lives alone.   Pt exercises by walking every other day.  Physical Exam  General:  Well-developed,well-nourished,in no  acute distress; alert,appropriate and cooperative throughout examination Chest Wall:  Pain to palpation of Rt lower ribs posteriorly.  No skin rash. Lungs:  Normal respiratory effort, chest expands symmetrically. Lungs are clear to auscultation, no crackles or wheezes. Heart:  Normal rate and regular rhythm. S1 and S2 normal without gallop, murmur, click, rub or other extra sounds. Abdomen:  Bowel sounds positive,abdomen soft and non-tender without masses, organomegaly or hernias noted.   Impression & Recommendations:  Problem # 1:  BACK PAIN (ICD-724.5) Musculoskeletal.  Ibuprofen.  UA clear so  not renal Her updated medication list for this problem includes:    Aspirin Buffered 325 Mg Tabs (Aspirin buff(mgcarb-alaminoac)) .Marland Kitchen... 1 tab by mouth daily    Cyclobenzaprine Hcl 5 Mg Tabs (Cyclobenzaprine hcl) .Marland Kitchen... 1-2 by mouth as needed up to three times a day for muscle pain    Ibuprofen 400 Mg Tabs (Ibuprofen) ..... One by mouth three times a day as needed back pain.  take with food.  Orders: Urinalysis-[facility] (00000) [facility]- Est Level  3 (51884)  Complete Medication List: 1)  Glucophage 500 Mg Tabs (Metformin hcl) .... Take 1 tablet by mouth twice a day 2)  Hyzaar 100-25 Mg Tabs (Losartan potassium-hctz) .Marland Kitchen.. 1 tab by mouth daily 3)  Metoprolol Tartrate 50 Mg Tabs (Metoprolol tartrate) .Marland Kitchen.. 1 tab by mouth bid 4)  Aspirin Buffered 325 Mg Tabs (Aspirin buff(mgcarb-alaminoac)) .Marland Kitchen.. 1 tab by mouth daily 5)  Zocor 40 Mg Tabs (Simvastatin) .Marland Kitchen.. 1 tab by mouth at bedtime 6)  Accucheck Test Strips  .... Use to test blood sugar twice daily 7)  Nexium 40 Mg Cpdr (Esomeprazole magnesium) .... Take 1 tab each morning failed prilosec.  h/o pud (533.90) 8)  Citalopram Hydrobromide 20 Mg Tabs (Citalopram hydrobromide) .... Take 1 by mouth daily to help with hot flashes. 9)  Cyclobenzaprine Hcl 5 Mg Tabs (Cyclobenzaprine hcl) .Marland Kitchen.. 1-2 by mouth as needed up to three times a day for muscle pain 10)  Ibuprofen 400 Mg Tabs (Ibuprofen) .... One by mouth three times a day as needed back pain.  take with food. Prescriptions: IBUPROFEN 400 MG TABS (IBUPROFEN) one by mouth three times a day as needed back pain.  Take with food.  #100 x 1   Entered and Authorized by:   Doralee Albino MD   Signed by:   Doralee Albino MD on 08/25/2009   Method used:   Electronically to        [facility] (retail)       [address]       [place], [place]  [id number]       Ph: [phone]       Fax: [phone]   RxID:   [id number]   Laboratory Results   Urine Tests  Date/Time Received: August 25, 2009 2:14 PM  Date/Time Reported: August 25, 2009 2:22 PM   Routine Urinalysis   Color: yellow Appearance: Clear Glucose: negative   (Normal Range: Negative) Bilirubin: negative   (Normal Range: Negative) Ketone: negative   (Normal Range: Negative) Spec. Gravity: 1.020   (Normal Range: 1.003-1.035) Blood: negative   (Normal Range: Negative) pH: 6.0   (Normal Range: 5.0-8.0) Protein: negative   (Normal Range: Negative) Urobilinogen: 0.2   (Normal Range: 0-1) Nitrite: negative   (Normal Range: Negative) Leukocyte Esterace: negative   (Normal Range: Negative)    Comments: ...........test performed by...........Marland KitchenTerese Door, CMA

## 2010-03-06 NOTE — Progress Notes (Signed)
Summary: Triage call  Phone Note Call from Patient   Caller: Patient Call For: [phone] Summary of Call: Patient with cold symptoms. "Just not feeling well".  Wanted something called in but was informed she would have to be seen.  Want nurse to call back with an afternoon appt Initial call taken by: Abundio Miu,  January 09, 2010 9:23 AM  Follow-up for Phone Call        Pt afebrile, just has a head cold and feels bad.  Told her there was not much we could for a cold.  Recommended that she try OTCs, stay hydrated and call us back in a couple of days if she is not feeling any better.  She wanted to know specifically what to take.  I recommended that she talk with the pharmacist, describe symptoms to them and they could give best recommendations.  Pt agreeable. Follow-up by: Dennison Nancy RN,  January 09, 2010 9:51 AM

## 2010-03-06 NOTE — Progress Notes (Signed)
Summary: refill  Phone Note Refill Request Call back at Home Phone [phone] Message from:  Patient  Refills Requested: Medication #1:  ACCUCHECK TEST STRIPS use to test blood sugar twice daily Initial call taken by: De Nurse,  May 01, 2009 9:44 AM  Follow-up for Phone Call        I called them in. 1 month with 11 refills Follow-up by: Golden Circle RN,  May 01, 2009 9:52 AM

## 2010-03-06 NOTE — Assessment & Plan Note (Signed)
Summary: cpe,df   Vital Signs:  Patient profile:   70 year old female Height:      60.5 inches Weight:      201.5 pounds BMI:     38.84 Temp:     98.0 degrees F oral Pulse rate:   71 / minute BP sitting:   112 / 75  (left arm) Cuff size:   large  Vitals Entered By: Garen Grams LPN (Jun 21, 2009 2:33 PM) CC: cough/hot flashes Is Patient Diabetic? Yes Did you bring your meter with you today? No Pain Assessment Patient in pain? no        Primary Care Provider:  Magnus Ivan MD  CC:  cough/hot flashes.  History of Present Illness: cough: patient with 2 week history of productive cough (white & green mucus) that is accompanied by rhinorrhea, chest and back pain after coughing.  pt reports that hot tea and honey somewhat relieve symptoms.  nothing makes symptoms worse.  patient denies ear pain, sore throat, and shortness of breath. low grade fever at home (highest 99 as per patient)  hot flashes: chronic problem for patient.  patient reports a history of hot flashes that were treated unsucessfully with an OTC preparation.  patient had partial historectomy about 10 years ago.  patient also awakes out of sleep with these hot flashes  Habits & Providers  Alcohol-Tobacco-Diet     Tobacco Status: never  Allergies: 1)  ! Penicillin 2)  ! Codeine 3)  ! Sulfa 4)  Amoxicillin (Amoxicillin) 5)  Codeine Phosphate (Codeine Phosphate) 6)  Sulfamethoxazole (Sulfamethoxazole)  Review of Systems       as per hpi.  Physical Exam  General:  NAD, obese vital signs noted and wnl Nose:  nostrils without erythema and edema Mouth:  no erythema, no post nasal drip Lungs:  normal respiratory effort, no crackles, and no wheezes or ronchi Msk:  tenderness to palpation of right back.  no chest tenderness to palpation.    Impression & Recommendations:  Problem # 1:  COUGH (ICD-786.2) Assessment New  Cough is secondary to a viral process.  Likely not bacterial as patient has no  fever on presentation and low grade fever at home is not  extremely high.  Told patient to continue symptomatic treatment her cold.  Also told patient that back and chest pain will likely resolve upon resolution of cough because hpi seems to support that the pain is caused by patient's coughing.  Orders: [facility]- Est  Level 4 (04540)  Problem # 2:  HOT FLASHES (ICD-627.2)  This is a chronic issue for patient as per records and hpi.  Could be attributed to continuing menopause symptoms but as per patient's age, these symptoms should have resolved.  Wanted to rule out that hot flashes are not night sweats indicating a more serious condition.  Will do a chest x ray to ensure that patient does not have tuberculosis or some other type of some other type of cancerous process causing symptom.  Will treat patient for postmenopausal symptoms with a low dose of zoloft.  Will follow up with patient in a month and will follow up on chest x ray.  Orders: [facility]- Est  Level 4 (99214)  Complete Medication List: 1)  Glucophage 500 Mg Tabs (Metformin hcl) .... Take 1 tablet by mouth twice a day 2)  Hyzaar 100-25 Mg Tabs (Losartan potassium-hctz) .Marland Kitchen.. 1 tab by mouth daily 3)  Metoprolol Tartrate 50 Mg Tabs (Metoprolol tartrate) .Marland Kitchen.. 1 tab by  mouth bid 4)  Aspirin Buffered 325 Mg Tabs (Aspirin buff(mgcarb-alaminoac)) .Marland Kitchen.. 1 tab by mouth daily 5)  Zocor 40 Mg Tabs (Simvastatin) .Marland Kitchen.. 1 tab by mouth at bedtime 6)  Accucheck Test Strips  .... Use to test blood sugar twice daily 7)  Nexium 40 Mg Cpdr (Esomeprazole magnesium) .... Take 1 tab each morning failed prilosec.  h/o pud (533.90) 8)  Nasonex 50 Mcg/act Susp (Mometasone furoate) .... 2 sprays per nostrial every day 9)  Loratadine 10 Mg Tabs (Loratadine) .Marland Kitchen.. 1 tab by mouth daily 10)  Tramadol-acetaminophen 37.5-325 Mg Tabs (Tramadol-acetaminophen) .... One tab q 4-6 hours as needed for pain 11)  Sertraline Hcl 25 Mg Tabs (Sertraline hcl) .... Take one tablet once a  day  Other Orders: CXR- 2view (CXR)  Patient Instructions: 1)  Ms. Steyer, I'm sorry that you are having chest pain and cold symptoms and hot flashes. 2)  We are going to start you on some SERTRALINE to try and help those hot flashes. 3)  Also, because of you awaking with hot flashes, we want to make sure that you don't have an infection called TUBERCULOSIS.  We will get an X Ray for that. 4)  I will call you for your results and come to see me in a month to see if your hot flashes have improved. 5)  If you are feeling worse, please call the [facility] at [phone] or report to your local ED. 6)  Thank you and be blessed! Prescriptions: SERTRALINE HCL 25 MG TABS (SERTRALINE HCL) take one tablet once a day  #30 x 0   Entered and Authorized by:   Magnus Ivan MD   Signed by:   Magnus Ivan MD on 06/21/2009   Method used:   Electronically to        [facility] (retail)       [address]       [place], [place]  [id number]       Ph: [phone]       Fax: [phone]   RxID:   [id number]

## 2010-03-06 NOTE — Miscellaneous (Signed)
Summary: orders for 6/20  Clinical Lists Changes  Orders: Added new Test order of [facility]- New Level 3 (04540) - Signed

## 2010-03-06 NOTE — Assessment & Plan Note (Signed)
Summary: F/U  Amber [name]   Vital Signs:  Patient profile:   70 year old female Height:      60.5 inches Weight:      200.7 pounds BMI:     38.69 Temp:     98.4 degrees F oral Pulse rate:   71 / minute BP sitting:   142 / 83  (left arm) Cuff size:   regular  Vitals Entered By: Jimmy Footman, CMA (January 01, 2010 8:55 AM)  Nutrition Counseling: Patient's BMI is greater than 25 and therefore counseled on weight management options.  Primary Provider:  Edd Arbour   History of Present Illness: 1. fatigue f/u for fatigue. Patient states that she only sleeps 5 hours a night and is awake for 19 hours daily with only a 30 minute nap during the day. She has a normal tsh/cbc. Says despite the fatigue she is happy and feels healthy.   2. obesity/nutritional counseling patient has a BMI of 38. she says this is a result of sedentary lifestyle and eating a lot of sweets. she eats cookies/cakes daily. We filled out a questionaire to see how addicted she is to sugar and she scored in the addiction range. she was advised on healthy eating habits, how to restrict sugar in her diet. Motivational dialogue that amounted to her having a strong positive will to reduce her sugar intake.  3. HTN controlled today on current medications. will continue. 143/83 - slightly high for a DM patient, but she does not want to adjust medications at this time.  4. palpitations has history of fluttering of the heart, she says since she has been taking metoprolol this has not been an issue.  5. Discuss labs from previous visit her CBC was normal, we were concerned about a history of iron-def anemia being a cause of fatigue, this is not the cause.   Spent 25 minutes in room with patient for nutrition/obesity counseling including an addiction to sugar questionaire.    Allergies: 1)  ! Penicillin 2)  ! Codeine 3)  ! Sulfa 4)  Amoxicillin (Amoxicillin) 5)  Codeine Phosphate (Codeine Phosphate) 6)  Sulfamethoxazole  (Sulfamethoxazole)  Review of Systems       see HPI  Physical Exam  General:  Well-developed,well-nourished,in no acute distress; alert,appropriate and cooperative throughout examination Neck:  No deformities, masses, or tenderness noted. Lungs:  Normal respiratory effort, chest expands symmetrically. Lungs are clear to auscultation, no crackles or wheezes. Heart:  Normal rate and regular rhythm. S1 and S2 normal without gallop, murmur, click, rub or other extra sounds.   Impression & Recommendations:  Problem # 1:  OBESITY, NOS (ICD-278.00) see HPI  Orders: [facility]- Est  Level 4 (09811)  Problem # 2:  FATIGUE (ICD-780.79) Assessment: Improved see hpi  Problem # 3:  PALPITATIONS (ICD-785.1) Assessment: Improved see hpi  Orders: [facility]- Est  Level 4 (91478)  Problem # 4:  HYPERTENSION, BENIGN SYSTEMIC (ICD-401.1) see hpi  Her updated medication list for this problem includes:    Hyzaar 100-25 Mg Tabs (Losartan potassium-hctz) .Marland Kitchen... 1 tab by mouth daily  Orders: [facility]- Est  Level 4 (99214)  Complete Medication List: 1)  Glucophage 500 Mg Tabs (Metformin hcl) .... Take 1 tablet by mouth twice a day 2)  Hyzaar 100-25 Mg Tabs (Losartan potassium-hctz) .Marland Kitchen.. 1 tab by mouth daily 3)  Metoprolol Tartrate 100 Mg Tabs (metoprolol Tartrate)  .Marland Kitchen.. 1 tab by mouth bid 4)  Aspirin Buffered 325 Mg Tabs (Aspirin buff(mgcarb-alaminoac)) .Marland Kitchen.. 1 tab by  mouth daily 5)  Zocor 40 Mg Tabs (Simvastatin) .Marland Kitchen.. 1 tab by mouth at bedtime 6)  Accucheck Test Strips  .... Use to test blood sugar twice daily 7)  Nexium 40 Mg Cpdr (Esomeprazole magnesium) .... Take 1 tab each morning failed prilosec.  h/o pud (533.90) 8)  Citalopram Hydrobromide 20 Mg Tabs (Citalopram hydrobromide) .... Take 1 by mouth daily to help with hot flashes. 9)  Cyclobenzaprine Hcl 5 Mg Tabs (Cyclobenzaprine hcl) .Marland Kitchen.. 1-2 by mouth as needed up to three times a day for muscle pain 10)  Ibuprofen 400 Mg Tabs (Ibuprofen) .... One by  mouth three times a day as needed back pain.  take with food. 11)  Ferotrinsic Caps (Fe fumarate-b12-vit c-fa-ifc) .... Take one tab by mouth daily with meals.  Patient Instructions: 1)  Please schedule a follow-up appointment as needed. 2)  Limit your Sodium (Salt). 3)  It is important that you exercise regularly at least 20 minutes 5 times a week. If you develop chest pain, have severe difficulty breathing, or feel very tired , stop exercising immediately and seek medical attention. 4)  You need to lose weight. Consider a lower calorie diet and regular exercise.    Orders Added: 1)  [facility]- Est  Level 4 [16109]

## 2010-03-06 NOTE — Assessment & Plan Note (Signed)
Summary: FLU SHOT/KH  Nurse Visit   Vital Signs:  Patient profile:   70 year old female Temp:     98.4 degrees F  Vitals Entered By: Theresia Lo RN (December 14, 2009 12:12 PM)  Allergies: 1)  ! Penicillin 2)  ! Codeine 3)  ! Sulfa 4)  Amoxicillin (Amoxicillin) 5)  Codeine Phosphate (Codeine Phosphate) 6)  Sulfamethoxazole (Sulfamethoxazole)  Immunizations Administered:  Influenza Vaccine # 1:    Vaccine Type: Fluvax MCR    Site: right deltoid    Mfr: [employer]    Dose: 0.5 ml    Route: IM    Given by: Theresia Lo RN    Exp. Date: 08/04/2010    Lot #: [id number]    VIS given: 08/29/09 version given December 14, 2009.  Flu Vaccine Consent Questions:    Do you have a history of severe allergic reactions to this vaccine? no    Any prior history of allergic reactions to egg and/or gelatin? no    Do you have a sensitivity to the preservative Thimersol? no    Do you have a past history of Guillan-Barre Syndrome? no    Do you currently have an acute febrile illness? no    Have you ever had a severe reaction to latex? no    Vaccine information given and explained to patient? yes    Are you currently pregnant? no  Orders Added: 1)  Influenza Vaccine MCR [00025] 2)  Administration Flu vaccine - MCR [G0008]

## 2010-03-06 NOTE — Assessment & Plan Note (Signed)
Summary: back pain,tcb   Vital Signs:  Patient profile:   70 year old female Height:      60.5 inches Weight:      198 pounds BMI:     38.17 Temp:     97.7 degrees F oral Pulse rate:   68 / minute BP sitting:   104 / 61  (right arm) Cuff size:   large  Vitals Entered By: Tessie Fass CMA (February 24, 2009 9:57 AM)  Primary Care Provider:  Magnus Ivan MD   History of Present Illness: patient reports intermittent right side pain that radiates to RUQ for one week.  Pain is described as "soreness" with no aggravating factors and improves with tylenol.  Denies n/v/d.  No known injury, currently unemployeed, exercise with walking and cleaning house.  Denies having previous hx of back pain, stomach problems or urinary symptoms.  Brought up her thyroid gland and seems to beleive that it is too large and something should be done about it.  Her food sticks, recent swallowing study (no report from Speech [facility]) but reportedly normal by patient.  Current Medications (verified): 1)  Glucophage 500 Mg Tabs (Metformin Hcl) .... Take 1 Tablet By Mouth Twice A Day 2)  Hyzaar 100-25 Mg  Tabs (Losartan Potassium-Hctz) .Marland Kitchen.. 1 Tab By Mouth Daily 3)  Metoprolol Tartrate 50 Mg  Tabs (Metoprolol Tartrate) .Marland Kitchen.. 1 Tab By Mouth Bid 4)  Aspirin Buffered 325 Mg  Tabs (Aspirin Buff(Mgcarb-Alaminoac)) .Marland Kitchen.. 1 Tab By Mouth Daily 5)  Zocor 40 Mg  Tabs (Simvastatin) .Marland Kitchen.. 1 Tab By Mouth At Bedtime 6)  Accucheck Test Strips .... Use To Test Blood Sugar Twice Daily 7)  Nexium 40 Mg Cpdr (Esomeprazole Magnesium) .... Take 1 Tab Each Morning Failed Prilosec.  H/o Pud (533.90) 8)  Nasonex 50 Mcg/act Susp (Mometasone Furoate) .... 2 Sprays Per Nostrial Every Day 9)  Loratadine 10 Mg Tabs (Loratadine) .Marland Kitchen.. 1 Tab By Mouth Daily 10)  Tramadol-Acetaminophen 37.5-325 Mg Tabs (Tramadol-Acetaminophen) .... One Tab Q 4-6 Hours As Needed For Pain  Allergies (verified): 1)  ! Penicillin 2)  ! Codeine 3)  ! Sulfa 4)   Amoxicillin (Amoxicillin) 5)  Codeine Phosphate (Codeine Phosphate) 6)  Sulfamethoxazole (Sulfamethoxazole)  Review of Systems General:  Denies chills, fatigue, fever, malaise, and sweats. CV:  Denies fatigue and shortness of breath with exertion. Resp:  Denies chest pain with inspiration and shortness of breath. GI:  Denies change in bowel habits, constipation, diarrhea, nausea, and vomiting. GU:  Denies dysuria and urinary hesitancy. MS:  Denies joint pain, loss of strength, and low back pain.  Physical Exam  General:  Well-developed,well-nourished,in no acute distress; alert,appropriate and cooperative throughout examination Neck:  palpalble tyroid glad, did not appreciate any nodules or hypertrophy. Chest Wall:  No deformities, masses, or tenderness noted. Lungs:  Normal respiratory effort, chest expands symmetrically. Lungs are clear to auscultation, no crackles or wheezes. Heart:  Normal rate and regular rhythm. S1 and S2 normal without gallop, murmur, click, rub or other extra sounds. Abdomen:  Bowel sounds positive,abdomen soft without masses, organomegaly or hernias noted.  Tenderness noted in ruq with deep palpation Msk:  FROM of spine, ambulates without difficulty, heel to toe intact, patellar reflexes symmetrical, negative straight leg raises.   Skin:  Intact without suspicious lesions or rashes    Impression & Recommendations:  Problem # 1:  RUQ PAIN (ICD-789.01)  Pt. tender with deep palpation of RUQ, reviewed previous abdomen US done 4/09 which showed small hepatic cyst,  will obtain lab studies to evaluate liver and pacreatic enzymes. Pt. prescribed ultram for pain relief and was instructed to return to clinic for increased pain and worsening symptoms.  Consider repeat of RUQ [name], otherwise will follow up with Dr. Mayford Knife in two weeks.  Orders: Comp Met-[facility] [phone]) Lipase-[facility] [phone]) CBC-[facility] (52841) [facility]- Est Level  3 (32440)  Problem # 2:  DYSPHAGIA  UNSPECIFIED (ICD-787.20) Normal swallowing study, normal thyroid function, reasurance to patient.  Complete Medication List: 1)  Glucophage 500 Mg Tabs (Metformin hcl) .... Take 1 tablet by mouth twice a day 2)  Hyzaar 100-25 Mg Tabs (Losartan potassium-hctz) .Marland Kitchen.. 1 tab by mouth daily 3)  Metoprolol Tartrate 50 Mg Tabs (Metoprolol tartrate) .Marland Kitchen.. 1 tab by mouth bid 4)  Aspirin Buffered 325 Mg Tabs (Aspirin buff(mgcarb-alaminoac)) .Marland Kitchen.. 1 tab by mouth daily 5)  Zocor 40 Mg Tabs (Simvastatin) .Marland Kitchen.. 1 tab by mouth at bedtime 6)  Accucheck Test Strips  .... Use to test blood sugar twice daily 7)  Nexium 40 Mg Cpdr (Esomeprazole magnesium) .... Take 1 tab each morning failed prilosec.  h/o pud (533.90) 8)  Nasonex 50 Mcg/act Susp (Mometasone furoate) .... 2 sprays per nostrial every day 9)  Loratadine 10 Mg Tabs (Loratadine) .Marland Kitchen.. 1 tab by mouth daily 10)  Tramadol-acetaminophen 37.5-325 Mg Tabs (Tramadol-acetaminophen) .... One tab q 4-6 hours as needed for pain  Patient Instructions: 1)  If pain increases or interferes with sleep call and we will schedule US of the abdomen 2)  Use pain medcine as needed 3)  Return in 2 weeks with Dr. Mayford Knife to follow up

## 2010-03-06 NOTE — Progress Notes (Signed)
Summary: lab results  Phone Note Call from Patient Call back at Home Phone [phone]   Caller: Patient Summary of Call: wants to know lab results Initial call taken by: De Nurse,  February 27, 2009 4:41 PM  Follow-up for Phone Call        will forward to Luretha [name]. Follow-up by: Theresia Lo RN,  February 27, 2009 5:18 PM  Additional Follow-up for Phone Call Additional follow up Details #1::        called, no ans. labs are normal, will try again, tried 2 more times, no answer Additional Follow-up by: Luretha [name] NP,  February 28, 2009 9:18 AM

## 2010-03-06 NOTE — Progress Notes (Signed)
Summary: refill prob  Phone Note Refill Request Call back at Home Phone [phone] Message from:  Patient  Refills Requested: Medication #1:  ZOCOR 40 MG  TABS 1 tab by mouth at bedtime  Medication #2:  HYZAAR 100-25 MG  TABS 1 tab by mouth daily pt is now out   Initial call taken by: De Nurse,  September 29, 2009 2:18 PM  Follow-up for Phone Call        Called patient and informed her of the rx's electronically faxed to pharmacy Follow-up by: Jimmy Footman, CMA,  September 29, 2009 4:34 PM    Prescriptions: ZOCOR 40 MG  TABS (SIMVASTATIN) 1 tab by mouth at bedtime  #31 Tablet x 4   Entered and Authorized by:   Denny Levy MD   Signed by:   Denny Levy MD on 09/29/2009   Method used:   Electronically to        [facility] (retail)       [address]       [place], [place]  [id number]       Ph: [phone]       Fax: [phone]   RxID:   [id number] HYZAAR 100-25 MG  TABS (LOSARTAN POTASSIUM-HCTZ) 1 tab by mouth daily  #30 x 3   Entered and Authorized by:   Denny Levy MD   Signed by:   Denny Levy MD on 09/29/2009   Method used:   Electronically to        [facility] (retail)       [address]       [place], [place]  [id number]       Ph: [phone]       Fax: [phone]   RxID:   [id number]   DEAR WHITE TEAM I [facility] to [facility]--I assume that is where she wanted them Thanks!  Denny Levy MD  September 29, 2009 4:25 PM

## 2010-03-06 NOTE — Progress Notes (Signed)
Summary: triage  Phone Note Call from Patient Call back at Home Phone [phone]   Caller: Patient Summary of Call: pt states that she feels very weak and likes she going to pass out. Initial call taken by: De Nurse,  November 06, 2009 4:19 PM  Follow-up for Phone Call         thinks she may be coming down with something. states her legs ache. R ear aches. feels "cold & hot". says she does not eat breakfast. had soup for lunch. told her she may feel bad because she needs to eat more. she agreed to suck on a peppermint while she heats up more soup. will drink more tea as well. dtr will be there soon & go get her ibu. told her to take with food. told her this may greatly reduce her aches from her arthritis.  she declined an appt for her ear. states she jhas an appt on the 18th. asked her to call back & we can get her seen sooner.  Follow-up by: Golden Circle RN,  November 06, 2009 4:26 PM    Prescriptions: IBUPROFEN 400 MG TABS (IBUPROFEN) one by mouth three times a day as needed back pain.  Take with food.  #100 x 1   Entered by:   Golden Circle RN   Authorized by:   Sarah Swaziland MD   Signed by:   Golden Circle RN on 11/06/2009   Method used:   Electronically to        [facility] (retail)       [address]       [place], [place]  [id number]       Ph: [phone]       Fax: [phone]   RxID:   [id number]

## 2010-03-06 NOTE — Assessment & Plan Note (Signed)
Summary: f/u eo   Vital Signs:  Patient profile:   70 year old female Height:      60.5 inches Weight:      199.6 pounds BMI:     38.48 Temp:     98.7 degrees F oral Pulse rate:   64 / minute BP sitting:   142 / 79  (left arm) Cuff size:   regular  Vitals Entered By: Garen Grams LPN (November 21, 2009 1:51 PM) CC: f/u bp Is Patient Diabetic? No Pain Assessment Patient in pain? no        Primary Provider:  Edd Arbour  CC:  f/u bp.  History of Present Illness: Patient Here with c/o fatigue and nasal congestion.  She says for the past three weeks she has not had the energy to do anything. She has a history of Iron def. anemia and has not been taken Iron supplements. She does not have any symptoms or signs to suggest a prolonged viral infection. her stuffiness in her nose is minimal and she has no other stigmata to suggest a viral syndrome. She does not meet any criteria for depression other than occasional poor sleeping. She eats well and is happy, has interest in movies and doing things.   Preventive Screening-Counseling & Management  Alcohol-Tobacco     Smoking Status: never     Year Quit: 1981     Passive Smoke Exposure: yes  Problems Prior to Update: 1)  Back Pain  (ICD-724.5) 2)  Hot Flashes  (ICD-627.2) 3)  Cough  (ICD-786.2) 4)  Night Sweats  (ICD-780.8) 5)  Generalized Pain  (ICD-780.96) 6)  Diseases of Lips  (ICD-528.5) 7)  Headache  (ICD-784.0) 8)  Ruq Pain  (ICD-789.01) 9)  Problems With Swallowing and Mastication  (ICD-V41.6) 10)  Allergic Rhinitis  (ICD-477.9) 11)  Edema  (ICD-782.3) 12)  Gerd  (ICD-530.81) 13)  Proteinuria  (ICD-791.0) 14)  Peptic Ulcer Dis., Unspec. w/o Obstruction  (ICD-533.90) 15)  Palpitations  (ICD-785.1) 16)  Osteoarthritis, Multi Sites  (ICD-715.98) 17)  Obesity, Nos  (ICD-278.00) 18)  Mitral Valve Prolapse  (ICD-424.0) 19)  Hypertension, Benign Systemic  (ICD-401.1) 20)  Hyperlipidemia  (ICD-272.4) 21)  Dysphagia   (ICD-787.2) 22)  Diverticulitis of Colon, Nos  (ICD-562.11) 23)  Diabetes Mellitus II, Uncomplicated  (ICD-250.00) 24)  Anemia, Iron Deficiency, Unspec.  (ICD-280.9)  Current Problems (verified): 1)  Fatigue  (ICD-780.79) 2)  Back Pain  (ICD-724.5) 3)  Hot Flashes  (ICD-627.2) 4)  Cough  (ICD-786.2) 5)  Night Sweats  (ICD-780.8) 6)  Generalized Pain  (ICD-780.96) 7)  Diseases of Lips  (ICD-528.5) 8)  Headache  (ICD-784.0) 9)  Ruq Pain  (ICD-789.01) 10)  Problems With Swallowing and Mastication  (ICD-V41.6) 11)  Allergic Rhinitis  (ICD-477.9) 12)  Edema  (ICD-782.3) 13)  Gerd  (ICD-530.81) 14)  Proteinuria  (ICD-791.0) 15)  Peptic Ulcer Dis., Unspec. w/o Obstruction  (ICD-533.90) 16)  Palpitations  (ICD-785.1) 17)  Osteoarthritis, Multi Sites  (ICD-715.98) 18)  Obesity, Nos  (ICD-278.00) 19)  Mitral Valve Prolapse  (ICD-424.0) 20)  Hypertension, Benign Systemic  (ICD-401.1) 21)  Hyperlipidemia  (ICD-272.4) 22)  Dysphagia  (ICD-787.2) 23)  Diverticulitis of Colon, Nos  (ICD-562.11) 24)  Diabetes Mellitus II, Uncomplicated  (ICD-250.00) 25)  Anemia, Iron Deficiency, Unspec.  (ICD-280.9)  Medications Prior to Update: 1)  Glucophage 500 Mg Tabs (Metformin Hcl) .... Take 1 Tablet By Mouth Twice A Day 2)  Hyzaar 100-25 Mg  Tabs (Losartan Potassium-Hctz) .Marland Kitchen.. 1 Tab By  Mouth Daily 3)  Metoprolol Tartrate 100 Mg  Tabs (Metoprolol Tartrate) .Marland Kitchen.. 1 Tab By Mouth Bid 4)  Aspirin Buffered 325 Mg  Tabs (Aspirin Buff(Mgcarb-Alaminoac)) .Marland Kitchen.. 1 Tab By Mouth Daily 5)  Zocor 40 Mg  Tabs (Simvastatin) .Marland Kitchen.. 1 Tab By Mouth At Bedtime 6)  Accucheck Test Strips .... Use To Test Blood Sugar Twice Daily 7)  Nexium 40 Mg Cpdr (Esomeprazole Magnesium) .... Take 1 Tab Each Morning Failed Prilosec.  H/o Pud (533.90) 8)  Citalopram Hydrobromide 20 Mg Tabs (Citalopram Hydrobromide) .... Take 1 By Mouth Daily To Help With Hot Flashes. 9)  Cyclobenzaprine Hcl 5 Mg Tabs (Cyclobenzaprine Hcl) .Marland Kitchen.. 1-2 By Mouth As  Needed Up To Three Times A Day For Muscle Pain 10)  Ibuprofen 400 Mg Tabs (Ibuprofen) .... One By Mouth Three Times A Day As Needed Back Pain.  Take With Food.  Current Medications (verified): 1)  Glucophage 500 Mg Tabs (Metformin Hcl) .... Take 1 Tablet By Mouth Twice A Day 2)  Hyzaar 100-25 Mg  Tabs (Losartan Potassium-Hctz) .Marland Kitchen.. 1 Tab By Mouth Daily 3)  Metoprolol Tartrate 100 Mg  Tabs (Metoprolol Tartrate) .Marland Kitchen.. 1 Tab By Mouth Bid 4)  Aspirin Buffered 325 Mg  Tabs (Aspirin Buff(Mgcarb-Alaminoac)) .Marland Kitchen.. 1 Tab By Mouth Daily 5)  Zocor 40 Mg  Tabs (Simvastatin) .Marland Kitchen.. 1 Tab By Mouth At Bedtime 6)  Accucheck Test Strips .... Use To Test Blood Sugar Twice Daily 7)  Nexium 40 Mg Cpdr (Esomeprazole Magnesium) .... Take 1 Tab Each Morning Failed Prilosec.  H/o Pud (533.90) 8)  Citalopram Hydrobromide 20 Mg Tabs (Citalopram Hydrobromide) .... Take 1 By Mouth Daily To Help With Hot Flashes. 9)  Cyclobenzaprine Hcl 5 Mg Tabs (Cyclobenzaprine Hcl) .Marland Kitchen.. 1-2 By Mouth As Needed Up To Three Times A Day For Muscle Pain 10)  Ibuprofen 400 Mg Tabs (Ibuprofen) .... One By Mouth Three Times A Day As Needed Back Pain.  Take With Food. 11)  Ferotrinsic  Caps (Fe Fumarate-B12-Vit C-Fa-Ifc) .... Take One Tab By Mouth Daily With Meals.  Allergies (verified): 1)  ! Penicillin 2)  ! Codeine 3)  ! Sulfa 4)  Amoxicillin (Amoxicillin) 5)  Codeine Phosphate (Codeine Phosphate) 6)  Sulfamethoxazole (Sulfamethoxazole)  Family History: Reviewed history from 10/21/2008 and no changes required. 2 living brothers 1? with htn 5 living sisters: 2 with diabetes, all with htn F deceased @ 62, MI M deceased @ 47, unknown cancer htn, diabetes children: 2 sons with diabetes and heart problems, 2 sons with htn, and daughter with htn and borderline  3 sisters died with pancreatic cancer (updated 10/21/2008)  Social History: Reviewed history from 08/04/2008 and no changes required. No tob, EtOH, drugs.  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  5 children, lives alone.   Pt exercises by walking every other day.  Review of Systems  The patient denies fever, weight gain, chest pain, syncope, prolonged cough, headaches, abdominal pain, melena, hematuria, muscle weakness, difficulty walking, depression, and abnormal bleeding.         c/o fatigue and nasal congestion.  Physical Exam  General:  alert, well-developed, and overweight-appearing.   Head:  normocephalic and atraumatic.   Eyes:  vision grossly intact.   Ears:  no external deformities.   Nose:  no external deformity.   Mouth:  good dentition.   Neck:  supple.   Chest Wall:  no deformities.   Lungs:  normal respiratory effort and  normal breath sounds.   Heart:  normal rate and regular rhythm.   Abdomen:  soft and non-tender.   Extremities:  No clubbing, cyanosis, edema, or deformity noted with normal full range of motion of all joints.   Neurologic:  alert & oriented X3, cranial nerves II-XII intact, strength normal in all extremities, and gait normal.     Impression & Recommendations:  Problem # 1:  FATIGUE (ICD-780.79) Assessment Deteriorated Patient has a history of Iron def. anemia with a low normal MCV of 79.  I perscribed her a Iron/Vitamin C combo tab to be taken with meals. we will follow up her labs for thyroid/anemia.  Orders: CBC-[facility] (16109) TSH-[facility] ([id number]) [facility]- Est Level  3 (91478)  Problem # 2:  ALLERGIC RHINITIS (ICD-477.9) Assessment: Deteriorated patient advised about simple over the counter strategies to deal with nasal congestion.  This is not viral, it appears allergirc.  Problem # 3:  ANEMIA, IRON DEFICIENCY, UNSPEC. (ICD-280.9) Assessment: Comment Only  Her updated medication list for this problem includes:    Ferotrinsic Caps (Fe fumarate-b12-vit c-fa-ifc) .Marland Kitchen... Take one tab by mouth daily with meals.  Orders: CBC-[facility] (29562) TSH-[facility]  ([id number]) [facility]- Est Level  3 (96295)  Problem # 4:  HYPERTENSION, BENIGN SYSTEMIC (ICD-401.1) Assessment: Improved  Her updated medication list for this problem includes:    Hyzaar 100-25 Mg Tabs (Losartan potassium-hctz) .Marland Kitchen... 1 tab by mouth daily  Orders: [facility]- Est Level  3 (28413)  Complete Medication List: 1)  Glucophage 500 Mg Tabs (Metformin hcl) .... Take 1 tablet by mouth twice a day 2)  Hyzaar 100-25 Mg Tabs (Losartan potassium-hctz) .Marland Kitchen.. 1 tab by mouth daily 3)  Metoprolol Tartrate 100 Mg Tabs (metoprolol Tartrate)  .Marland Kitchen.. 1 tab by mouth bid 4)  Aspirin Buffered 325 Mg Tabs (Aspirin buff(mgcarb-alaminoac)) .Marland Kitchen.. 1 tab by mouth daily 5)  Zocor 40 Mg Tabs (Simvastatin) .Marland Kitchen.. 1 tab by mouth at bedtime 6)  Accucheck Test Strips  .... Use to test blood sugar twice daily 7)  Nexium 40 Mg Cpdr (Esomeprazole magnesium) .... Take 1 tab each morning failed prilosec.  h/o pud (533.90) 8)  Citalopram Hydrobromide 20 Mg Tabs (Citalopram hydrobromide) .... Take 1 by mouth daily to help with hot flashes. 9)  Cyclobenzaprine Hcl 5 Mg Tabs (Cyclobenzaprine hcl) .Marland Kitchen.. 1-2 by mouth as needed up to three times a day for muscle pain 10)  Ibuprofen 400 Mg Tabs (Ibuprofen) .... One by mouth three times a day as needed back pain.  take with food. 11)  Ferotrinsic Caps (Fe fumarate-b12-vit c-fa-ifc) .... Take one tab by mouth daily with meals.  Patient Instructions: 1)  It was great seeing you. 2)  I hope you feel better soon. 3)  I will send you a letter with the results of your blood tests. 4)  Start taking the Iron and vitamin C now to help with your anemia. 5)  Come back and see me in one month. Prescriptions: FEROTRINSIC  CAPS (FE FUMARATE-B12-VIT C-FA-IFC) take one tab by mouth daily with meals.  #60 x 3   Entered and Authorized by:   Edd Arbour   Signed by:   Edd Arbour on 11/21/2009   Method used:   Faxed to ...       [facility] (retail)       [address]       [place], [place]  [id number]       Ph: [phone]  Fax: [phone]   RxID:   [id number] CITALOPRAM HYDROBROMIDE 20 MG TABS (CITALOPRAM HYDROBROMIDE) Take 1 by mouth daily to help with hot flashes.  #90 x 3   Entered and Authorized by:   Edd Arbour   Signed by:   Edd Arbour on 11/21/2009   Method used:   Faxed to ...       [facility] (retail)       [address]       [place], [place]  [id number]       Ph: [phone]       Fax: [phone]   RxID:   [id number] NEXIUM 40 MG CPDR (ESOMEPRAZOLE MAGNESIUM) Take 1 tab each morning failed prilosec.  h/o PUD (533.90)  #30 x 3   Entered and Authorized by:   Edd Arbour   Signed by:   Edd Arbour on 11/21/2009   Method used:   Faxed to ...       [facility] (retail)       [address]       [place], [place]  [id number]       Ph: [phone]       Fax: [phone]   RxID:   [id number] ACCUCHECK TEST STRIPS use to test blood sugar twice daily  #60 x 3   Entered and Authorized by:   Edd Arbour   Signed by:   Edd Arbour on 11/21/2009   Method used:   Faxed to ...       [facility] (retail)       [address]       [place], [place]  [id number]       Ph: [phone]       Fax: [phone]   RxID:   [id number] ZOCOR 40 MG  TABS (SIMVASTATIN) 1 tab by mouth at bedtime  #31 Tablet x 3   Entered and Authorized by:   Edd Arbour   Signed by:   Edd Arbour on 11/21/2009   Method used:   Faxed to ...       [facility] (retail)       [address]       [place], [place]  [address]       Ph: [phone]       Fax: [phone]   RxID:   [id number] ASPIRIN BUFFERED 325 MG  TABS (ASPIRIN BUFF(MGCARB-ALAMINOAC)) 1 tab by mouth daily  #30 x 3   Entered and Authorized by:   Edd Arbour   Signed by:   Edd Arbour on 11/21/2009   Method used:   Faxed to ...       [facility] ([facility]ail)       [address]       [place], [place]  [id number]       Ph: [phone]       Fax: [phone]   RxID:   [id number] METOPROLOL TARTRATE 100 MG  TABS (METOPROLOL TARTRATE) 1 tab by mouth bid  #90 x 3   Entered and Authorized by:   Edd Arbour   Signed by:   Edd Arbour on 11/21/2009   Method used:   Faxed to ...       [facility] (retail)       [address]       [place], [place]  [address]       Ph: [phone]  Fax: [phone]   RxID:   [id number] HYZAAR 100-25 MG  TABS (LOSARTAN POTASSIUM-HCTZ) 1 tab by mouth daily  #30 x 3   Entered and Authorized by:   Edd Arbour   Signed by:   Edd Arbour on 11/21/2009   Method used:   Faxed to ...       [facility] (retail)       [address]       [place], [place]  [id number]       Ph: [phone]       Fax: [phone]   RxID:   [id number] GLUCOPHAGE 500 MG TABS (METFORMIN HCL) Take 1 tablet by mouth twice a day  #180 x 3   Entered and Authorized by:   Edd Arbour   Signed by:   Edd Arbour on 11/21/2009   Method used:   Faxed to ...       [facility] (retail)       [address]       [place], [place]  [id number]       Ph: [phone]       Fax: [phone]   RxID:   [id number]    Orders Added: 1)  CBC-[facility] [85027] 2)  TSH-[facility] [[id number]] 3)  [facility]- Est Level  3 [61607]

## 2010-03-06 NOTE — Progress Notes (Signed)
Summary: Rx Ques  Phone Note Call from Patient Call back at [facility] Phone [phone]   Caller: Patient Summary of Call: Pt taking Losartan HCTZ 125 and just recently when taking it, it has caused her to be dizzy and nauseated. Initial call taken by: Clydell Hakim,  November 02, 2009 3:23 PM  Follow-up for Phone Call        fowarded to PCP Follow-up by: Jimmy Footman, CMA,  November 02, 2009 4:10 PM  Additional Follow-up for Phone Call Additional follow up Details #1::        that medication shouldnt have that side effect. dizziness maybe, but not nausea. Please have her see me in the office to adjust her meds. Additional Follow-up by: Edd Arbour,  November 02, 2009 4:17 PM    Additional Follow-up for Phone Call Additional follow up Details #2::    Attempted to call patient. Phone rang no answer and no machine to LM on Follow-up by: Jimmy Footman, CMA,  November 03, 2009 12:21 PM  Additional Follow-up for Phone Call Additional follow up Details #3:: Details for Additional Follow-up Action Taken: spoke with pt and she has appt on 10.18. she will discuss meds with pcp then Additional Follow-up by: Loralee Pacas CMA,  November 06, 2009 10:55 AM

## 2010-03-06 NOTE — Assessment & Plan Note (Signed)
Summary: back pain,tcb   Vital Signs:  Patient profile:   70 year old female Height:      60.5 inches Weight:      200.0 pounds BMI:     38.56 Temp:     98.0 degrees F oral Pulse rate:   75 / minute BP sitting:   144 / 77  (left arm) Cuff size:   large  Vitals Entered By: Gladstone Pih (July 20, 2009 9:41 AM) CC: C/O middle back pain X 2 weeks Is Patient Diabetic? Yes Did you bring your meter with you today? No Pain Assessment Patient in pain? yes     Location: back Intensity: 6 Type: aching Onset of pain  X 2 weeks Comments stated she started with a cold and cough 2 weeks ago and that is about the time the middle back pain started   Primary Care Provider:  Magnus Ivan MD  CC:  C/O middle back pain X 2 weeks.  History of Present Illness: 70 yo female with pain in back for 2 weeks.  Worse when lifting shoulder.  Coughing does not make it worse.  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  Taking Tylenol without relief.  Takes q 4 hours 2 tabs.  Had stomach ulcers in past, so avoided Aleve.  Cough is better.  No chest pain.  No trouble breathing. NO problems taking BP meds.   Still with problems with  sweats.  Uses air conditioner.  Has them 3-4 times a day and also through the night.  Never got the chest X-ray.  Feels like sweats have gotten worse.  Had hysterectomy for heavy bleeding, but still with ovaries.  sertraline did not help.  No weight loss.    Habits & Providers  Alcohol-Tobacco-Diet     Tobacco Status: never  Current Medications (verified): 1)  Glucophage 500 Mg Tabs (Metformin Hcl) .... Take 1 Tablet By Mouth Twice A Day 2)  Hyzaar 100-25 Mg  Tabs (Losartan Potassium-Hctz) .Marland Kitchen.. 1 Tab By Mouth Daily 3)  Metoprolol Tartrate 50 Mg  Tabs (Metoprolol Tartrate) .Marland Kitchen.. 1 Tab By Mouth Bid 4)  Aspirin Buffered 325 Mg  Tabs (Aspirin Buff(Mgcarb-Alaminoac)) .Marland Kitchen.. 1 Tab By Mouth Daily 5)  Zocor 40 Mg  Tabs (Simvastatin) .Marland Kitchen.. 1 Tab By Mouth At Bedtime 6)  Accucheck  Test Strips .... Use To Test Blood Sugar Twice Daily 7)  Nexium 40 Mg Cpdr (Esomeprazole Magnesium) .... Take 1 Tab Each Morning Failed Prilosec.  H/o Pud (533.90) 8)  Sertraline Hcl 25 Mg Tabs (Sertraline Hcl) .... Take One Tablet Once A Day  Allergies: 1)  ! Penicillin 2)  ! Codeine 3)  ! Sulfa 4)  Amoxicillin (Amoxicillin) 5)  Codeine Phosphate (Codeine Phosphate) 6)  Sulfamethoxazole (Sulfamethoxazole)   Impression & Recommendations:  Problem # 1:  BACK PAIN (ICD-724.5) Given pt's age, will check plain films.  Likely MS strain, especially given tight paraspinous muscles on exam.  Trial of cyclobenzaprine with precautions.  If no improvment, pt can call us and I will be happy to refer her to PT. The following medications were removed from the medication list:    Tramadol-acetaminophen 37.5-325 Mg Tabs (Tramadol-acetaminophen) ..... One tab q 4-6 hours as needed for pain Her updated medication list for this problem includes:    Aspirin Buffered 325 Mg Tabs (Aspirin buff(mgcarb-alaminoac)) .Marland Kitchen... 1 tab by mouth daily    Cyclobenzaprine Hcl 5 Mg Tabs (Cyclobenzaprine hcl) .Marland Kitchen... 1-2 by mouth as needed up to three times a day for muscle  pain  Orders: Radiology other (Radiology Other)  Problem # 2:  HOT FLASHES (ICD-627.2) Pt with increasing hot flushes.  Trial of paroxetine with d/c of sertraline.  F/u as needed  Complete Medication List: 1)  Glucophage 500 Mg Tabs (Metformin hcl) .... Take 1 tablet by mouth twice a day 2)  Hyzaar 100-25 Mg Tabs (Losartan potassium-hctz) .Marland Kitchen.. 1 tab by mouth daily 3)  Metoprolol Tartrate 50 Mg Tabs (Metoprolol tartrate) .Marland Kitchen.. 1 tab by mouth bid 4)  Aspirin Buffered 325 Mg Tabs (Aspirin buff(mgcarb-alaminoac)) .Marland Kitchen.. 1 tab by mouth daily 5)  Zocor 40 Mg Tabs (Simvastatin) .Marland Kitchen.. 1 tab by mouth at bedtime 6)  Accucheck Test Strips  .... Use to test blood sugar twice daily 7)  Nexium 40 Mg Cpdr (Esomeprazole magnesium) .... Take 1 tab each morning failed  prilosec.  h/o pud (533.90) 8)  Citalopram Hydrobromide 20 Mg Tabs (Citalopram hydrobromide) .... Take 1 by mouth daily to help with hot flashes. 9)  Cyclobenzaprine Hcl 5 Mg Tabs (Cyclobenzaprine hcl) .Marland Kitchen.. 1-2 by mouth as needed up to three times a day for muscle pain  Other Orders: CXR- 2view (CXR)  Patient Instructions: 1)  We will start the cyclobenzaprine to help with the tight muscles in your back.  If you don't feel better in 1-2 weeks, call our clinic and ask [name] to schedule a physical therapy referral. 2)  Stop the sertraline (Zoloft) and start the citalopram.  It may take several weeks before you feel better. 3)  Please go by the hospital today and get the x-rays. Prescriptions: CYCLOBENZAPRINE HCL 5 MG TABS (CYCLOBENZAPRINE HCL) 1-2 by mouth as needed up to three times a day for muscle pain  #90 x 0   Entered and Authorized by:   Sarah Swaziland MD   Signed by:   Sarah Swaziland MD on 07/20/2009   Method used:   Electronically to        [facility] (retail)       [address]       [place], [place]  [id number]       Ph: [phone]       Fax: [phone]   RxID:   [id number] CITALOPRAM HYDROBROMIDE 20 MG TABS (CITALOPRAM HYDROBROMIDE) Take 1 by mouth daily to help with hot flashes.  #90 x 0   Entered and Authorized by:   Sarah Swaziland MD   Signed by:   Sarah Swaziland MD on 07/20/2009   Method used:   Electronically to        [facility] (retail)       [address]       [place], [place]  [id number]       Ph: [phone]       Fax: [phone]   RxID:   [id number]    Prevention & Chronic Care Immunizations   Influenza vaccine: Fluvax 3+  (11/29/2008)   Influenza vaccine due: 01/09/2008    Tetanus booster: 04/04/2001: Done.   Tetanus booster due: 04/05/2011    Pneumococcal vaccine: Pneumovax (Medicare)  (01/22/2007)   Pneumococcal vaccine due: None    H. zoster vaccine: Not documented  Colorectal  Screening   Hemoccult: Done.  (08/04/2001)   Hemoccult due: Not Indicated    Colonoscopy: Not documented   Colonoscopy due: 12/24/2012  Other Screening   Pap smear: Done.  (11/11/1996)   Pap smear due: Not Indicated    Mammogram: Done.  (03/14/2006)   Mammogram due: 03/15/2007  DXA bone density scan: Done.  (02/11/2006)   DXA scan due: None    Smoking status: never  (07/20/2009)  Diabetes Mellitus   HgbA1C: 6.5  (05/03/2009)   Hemoglobin A1C due: 08/25/2007    Eye exam: Not documented    Foot exam: Not documented   High risk foot: Not documented   Foot care education: Not documented    Urine microalbumin/creatinine ratio: Not documented  Lipids   Total Cholesterol: 149  (05/11/2008)   LDL: 77  (05/11/2008)   LDL Direct: 98  (05/03/2009)   HDL: 49  (05/11/2008)   Triglycerides: 116  (05/11/2008)    SGOT (AST): 20  (02/24/2009)   SGPT (ALT): 14  (02/24/2009)   Alkaline phosphatase: 65  (02/24/2009)   Total bilirubin: 0.4  (02/24/2009)  Hypertension   Last Blood Pressure: 144 / 77  (07/20/2009)   Serum creatinine: 0.79  (02/24/2009)   Serum potassium 3.7  (02/24/2009)  Self-Management Support :   Personal Goals (by the next clinic visit) :     Personal A1C goal: 7  (05/03/2009)     Personal blood pressure goal: 140/90  (05/03/2009)     Personal LDL goal: 100  (05/03/2009)    Diabetes self-management support: Not documented    Diabetes self-management support not done because: Good outcomes  (05/03/2009)    Hypertension self-management support: Not documented    Hypertension self-management support not done because: Good outcomes  (05/03/2009)    Lipid self-management support: Not documented     Lipid self-management support not done because: Good outcomes  (05/03/2009)  Appended Document: back pain,tcb     Allergies: 1)  ! Penicillin 2)  ! Codeine 3)  ! Sulfa 4)  Amoxicillin (Amoxicillin) 5)  Codeine Phosphate (Codeine Phosphate) 6)   Sulfamethoxazole (Sulfamethoxazole)  Physical Exam  General:  Obese,in no acute distress; alert,appropriate and cooperative throughout examination Lungs:  Normal respiratory effort, chest expands symmetrically. Lungs are clear to auscultation, no crackles or wheezes. Heart:  Normal rate and regular rhythm. S1 and S2 normal without gallop, murmur, click, rub or other extra sounds. Msk:  No spinal TTP or erythema or warmth.  +paraspinous spasms, R>L. Able to ambulate without diffuculty.   Complete Medication List: 1)  Glucophage 500 Mg Tabs (Metformin hcl) .... Take 1 tablet by mouth twice a day 2)  Hyzaar 100-25 Mg Tabs (Losartan potassium-hctz) .Marland Kitchen.. 1 tab by mouth daily 3)  Metoprolol Tartrate 50 Mg Tabs (Metoprolol tartrate) .Marland Kitchen.. 1 tab by mouth bid 4)  Aspirin Buffered 325 Mg Tabs (Aspirin buff(mgcarb-alaminoac)) .Marland Kitchen.. 1 tab by mouth daily 5)  Zocor 40 Mg Tabs (Simvastatin) .Marland Kitchen.. 1 tab by mouth at bedtime 6)  Accucheck Test Strips  .... Use to test blood sugar twice daily 7)  Nexium 40 Mg Cpdr (Esomeprazole magnesium) .... Take 1 tab each morning failed prilosec.  h/o pud (533.90) 8)  Citalopram Hydrobromide 20 Mg Tabs (Citalopram hydrobromide) .... Take 1 by mouth daily to help with hot flashes. 9)  Cyclobenzaprine Hcl 5 Mg Tabs (Cyclobenzaprine hcl) .Marland Kitchen.. 1-2 by mouth as needed up to three times a day for muscle pain

## 2010-03-06 NOTE — Assessment & Plan Note (Signed)
Summary: f/up,tcb   Vital Signs:  Patient profile:   70 year old female Height:      60.5 inches Weight:      198.3 pounds BMI:     38.23 Temp:     97.8 degrees F oral Pulse rate:   76 / minute BP sitting:   145 / 85  (left arm) Cuff size:   regular  Vitals Entered By: Garen Grams LPN (October 20, 2009 2:31 PM) CC: f/u bp, dm Is Patient Diabetic? Yes Did you bring your meter with you today? No Pain Assessment Patient in pain? no        Primary Provider:  Edd Arbour  CC:  f/u bp and dm.  History of Present Illness: Pt. is a 70 y/o aaf with DM2, obesity, htn. She comes in to discuss her blood pressure, and get a HgA1c. Her A1c is 6.5.  Her Blood pressure taken again was 145/80. She has no other complaints.  Preventive Screening-Counseling & Management  Alcohol-Tobacco     Smoking Status: never     Year Quit: 1981     Passive Smoke Exposure: yes  Problems Prior to Update: 1)  Back Pain  (ICD-724.5) 2)  Hot Flashes  (ICD-627.2) 3)  Cough  (ICD-786.2) 4)  Night Sweats  (ICD-780.8) 5)  Generalized Pain  (ICD-780.96) 6)  Diseases of Lips  (ICD-528.5) 7)  Headache  (ICD-784.0) 8)  Dysphagia Unspecified  (ICD-787.20) 9)  Ruq Pain  (ICD-789.01) 10)  Problems With Swallowing and Mastication  (ICD-V41.6) 11)  Allergic Rhinitis  (ICD-477.9) 12)  Edema  (ICD-782.3) 13)  Gerd  (ICD-530.81) 14)  Proteinuria  (ICD-791.0) 15)  Peptic Ulcer Dis., Unspec. w/o Obstruction  (ICD-533.90) 16)  Palpitations  (ICD-785.1) 17)  Osteoarthritis, Multi Sites  (ICD-715.98) 18)  Obesity, Nos  (ICD-278.00) 19)  Mitral Valve Prolapse  (ICD-424.0) 20)  Hypertension, Benign Systemic  (ICD-401.1) 21)  Hyperlipidemia  (ICD-272.4) 22)  Dysphagia  (ICD-787.2) 23)  Diverticulitis of Colon, Nos  (ICD-562.11) 24)  Diabetes Mellitus II, Uncomplicated  (ICD-250.00) 25)  Anemia, Iron Deficiency, Unspec.  (ICD-280.9)  Medications Prior to Update: 1)  Glucophage 500 Mg Tabs (Metformin Hcl)  .... Take 1 Tablet By Mouth Twice A Day 2)  Hyzaar 100-25 Mg  Tabs (Losartan Potassium-Hctz) .Marland Kitchen.. 1 Tab By Mouth Daily 3)  Metoprolol Tartrate 50 Mg  Tabs (Metoprolol Tartrate) .Marland Kitchen.. 1 Tab By Mouth Bid 4)  Aspirin Buffered 325 Mg  Tabs (Aspirin Buff(Mgcarb-Alaminoac)) .Marland Kitchen.. 1 Tab By Mouth Daily 5)  Zocor 40 Mg  Tabs (Simvastatin) .Marland Kitchen.. 1 Tab By Mouth At Bedtime 6)  Accucheck Test Strips .... Use To Test Blood Sugar Twice Daily 7)  Nexium 40 Mg Cpdr (Esomeprazole Magnesium) .... Take 1 Tab Each Morning Failed Prilosec.  H/o Pud (533.90) 8)  Citalopram Hydrobromide 20 Mg Tabs (Citalopram Hydrobromide) .... Take 1 By Mouth Daily To Help With Hot Flashes. 9)  Cyclobenzaprine Hcl 5 Mg Tabs (Cyclobenzaprine Hcl) .Marland Kitchen.. 1-2 By Mouth As Needed Up To Three Times A Day For Muscle Pain 10)  Ibuprofen 400 Mg Tabs (Ibuprofen) .... One By Mouth Three Times A Day As Needed Back Pain.  Take With Food.  Allergies: 1)  ! Penicillin 2)  ! Codeine 3)  ! Sulfa 4)  Amoxicillin (Amoxicillin) 5)  Codeine Phosphate (Codeine Phosphate) 6)  Sulfamethoxazole (Sulfamethoxazole)  Past History:  Past Medical History: Last updated: 10/23/2008 allergic rhinitis, htn h/o temporal arteritis at age 64 and pt recently had a fall on 08/14/2008 (updated  on 10/21/2008)  Past Surgical History: Last updated: 08/04/2008 not reviewed on this appointment  Family History: Last updated: 10/21/2008 2 living brothers 1? with htn 5 living sisters: 2 with diabetes, all with htn F deceased @ 8, MI M deceased @ 76, unknown cancer htn, diabetes children: 2 sons with diabetes and heart problems, 2 sons with htn, and daughter with htn and borderline  3 sisters died with pancreatic cancer (updated 10/21/2008)  Social History: Last updated: 08/04/2008 No tob, EtOH, drugs.  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  5 children, lives alone.   Pt exercises by walking every  other day.  Risk Factors: Caffeine Use: 1 (05/14/2007) Exercise: yes (05/14/2007)  Risk Factors: Smoking Status: never (10/20/2009) Passive Smoke Exposure: yes (10/20/2009)  Physical Exam  General:  Well-developed,well-nourished,in no acute distress; alert,appropriate and cooperative throughout examination Head:  Normocephalic and atraumatic without obvious abnormalities. No apparent alopecia or balding. Eyes:  No corneal or conjunctival inflammation noted. EOMI. Perrla. Funduscopic exam benign, without hemorrhages, exudates or papilledema. Vision grossly normal. Lungs:  Normal respiratory effort, chest expands symmetrically. Lungs are clear to auscultation, no crackles or wheezes. Heart:  Normal rate and regular rhythm. S1 and S2 normal without gallop, murmur, click, rub or other extra sounds. Abdomen:  Bowel sounds positive,abdomen soft and non-tender without masses, organomegaly or hernias noted. Msk:  No deformity or scoliosis noted of thoracic or lumbar spine.   Pulses:  R and L carotid,radial,femoral,dorsalis pedis and posterior tibial pulses are full and equal bilaterally Extremities:  No clubbing, cyanosis, edema, or deformity noted with normal full range of motion of all joints.   Neurologic:  No cranial nerve deficits noted. Station and gait are normal. Plantar reflexes are down-going bilaterally. DTRs are symmetrical throughout. Sensory, motor and coordinative functions appear intact.   Impression & Recommendations:  Problem # 1:  DIABETES MELLITUS II, UNCOMPLICATED (ICD-250.00) Assessment Unchanged Her HgA1c is 6.5. we will continue her current medications. she was advised to lose weight in order to improve her type 2 DM   Her updated medication list for this problem includes:    Glucophage 500 Mg Tabs (Metformin hcl) .Marland Kitchen... Take 1 tablet by mouth twice a day    Hyzaar 100-25 Mg Tabs (Losartan potassium-hctz) .Marland Kitchen... 1 tab by mouth daily    Aspirin Buffered 325 Mg Tabs  (Aspirin buff(mgcarb-alaminoac)) .Marland Kitchen... 1 tab by mouth daily  Orders: A1C-[facility] (25956) [facility]- Est Level  3 (38756)  Problem # 2:  HYPERTENSION, BENIGN SYSTEMIC (ICD-401.1) Assessment: Deteriorated her blood pressure was 145/80 which is too high for a patient with DM. I increased her Metoprolol dose from 50 to 100. I advised her about the possible side effects of increasing Beta Blocker dosage.  Her updated medication list for this problem includes:    Hyzaar 100-25 Mg Tabs (Losartan potassium-hctz) .Marland Kitchen... 1 tab by mouth daily Metoprolol 100mg  two times a day  Orders: [facility]- Est Level  3 (43329)  Her updated medication list for this problem includes:    Hyzaar 100-25 Mg Tabs (Losartan potassium-hctz) .Marland Kitchen... 1 tab by mouth daily  Problem # 3:  OBESITY, NOS (ICD-278.00) Assessment: Unchanged Advised to eat less, and exercise more. Orders: [facility]- Est Level  3 (51884)  Complete Medication List: 1)  Glucophage 500 Mg Tabs (Metformin hcl) .... Take 1 tablet by mouth twice a day 2)  Hyzaar 100-25 Mg Tabs (Losartan potassium-hctz) .Marland Kitchen.. 1 tab by mouth daily 3)  Metoprolol Tartrate 100 Mg Tabs (metoprolol Tartrate)  .Marland Kitchen.. 1 tab by mouth bid 4)  Aspirin Buffered 325 Mg Tabs (Aspirin buff(mgcarb-alaminoac)) .Marland Kitchen.. 1 tab by mouth daily 5)  Zocor 40 Mg Tabs (Simvastatin) .Marland Kitchen.. 1 tab by mouth at bedtime 6)  Accucheck Test Strips  .... Use to test blood sugar twice daily 7)  Nexium 40 Mg Cpdr (Esomeprazole magnesium) .... Take 1 tab each morning failed prilosec.  h/o pud (533.90) 8)  Citalopram Hydrobromide 20 Mg Tabs (Citalopram hydrobromide) .... Take 1 by mouth daily to help with hot flashes. 9)  Cyclobenzaprine Hcl 5 Mg Tabs (Cyclobenzaprine hcl) .Marland Kitchen.. 1-2 by mouth as needed up to three times a day for muscle pain 10)  Ibuprofen 400 Mg Tabs (Ibuprofen) .... One by mouth three times a day as needed back pain.  take with food.  Patient Instructions: 1)  Thank you for coming. It was really great meeting you  for the first time. 2)  Please refill your medications. 3)  I increased your Metoprolol to 100 mg from 50 mg because your blood pressure was still high. 4)  Please schedule a follow-up appointment in 1 month. 5)  It is important that you exercise regularly at least 20 minutes 5 times a week. If you develop chest pain, have severe difficulty breathing, or feel very tired , stop exercising immediately and seek medical attention. 6)  You need to lose weight. Consider a lower calorie diet and regular exercise.  Prescriptions: IBUPROFEN 400 MG TABS (IBUPROFEN) one by mouth three times a day as needed back pain.  Take with food.  #100 x 1   Entered and Authorized by:   Edd Arbour   Signed by:   Edd Arbour on 10/20/2009   Method used:   Faxed to ...       [facility] (retail)       [address]       [place], [place]  [id number]       Ph: [phone]       Fax: [phone]   RxID:   [id number] CYCLOBENZAPRINE HCL 5 MG TABS (CYCLOBENZAPRINE HCL) 1-2 by mouth as needed up to three times a day for muscle pain  #90 x 0   Entered and Authorized by:   Edd Arbour   Signed by:   Edd Arbour on 10/20/2009   Method used:   Faxed to ...       [facility] (retail)       [address]       [place], [place]  [id number]       Ph: [phone]       Fax: [phone]   RxID:   [id number] NEXIUM 40 MG CPDR (ESOMEPRAZOLE MAGNESIUM) Take 1 tab each morning failed prilosec.  h/o PUD (533.90)  #30 x 11   Entered and Authorized by:   Edd Arbour   Signed by:   Edd Arbour on 10/20/2009   Method used:   Faxed to ...       [facility] (retail)       [address]       [place], [place]  [address]       Ph: [phone]       Fax: [phone]   RxID:   [id number] CITALOPRAM HYDROBROMIDE 20 MG TABS (CITALOPRAM HYDROBROMIDE) Take 1 by mouth daily to help with hot flashes.  #90 x 0   Entered and  Authorized  by:   Edd Arbour   Signed by:   Edd Arbour on 10/20/2009   Method used:   Faxed to ...       [facility] (retail)       [address]       [place], [place]  [id number]       Ph: [phone]       Fax: [phone]   RxID:   [id number] ACCUCHECK TEST STRIPS use to test blood sugar twice daily  #60 x 11   Entered and Authorized by:   Edd Arbour   Signed by:   Edd Arbour on 10/20/2009   Method used:   Faxed to ...       [facility] (retail)       [address]       [place], [place]  [id number]       Ph: [phone]       Fax: [phone]   RxID:   [id number] ZOCOR 40 MG  TABS (SIMVASTATIN) 1 tab by mouth at bedtime  #31 Tablet x 4   Entered and Authorized by:   Edd Arbour   Signed by:   Edd Arbour on 10/20/2009   Method used:   Faxed to ...       [facility] (retail)       [address]       [place], [place]  [id number]       Ph: [phone]       Fax: [phone]   RxID:   [id number] ASPIRIN BUFFERED 325 MG  TABS (ASPIRIN BUFF(MGCARB-ALAMINOAC)) 1 tab by mouth daily  #0 x 0   Entered and Authorized by:   Edd Arbour   Signed by:   Edd Arbour on 10/20/2009   Method used:   Faxed to ...       [facility] (retail)       [address]       [place], [place]  [address]       Ph: [phone]       Fax: [phone]   RxID:   [id number] GLUCOPHAGE 500 MG TABS (METFORMIN HCL) Take 1 tablet by mouth twice a day  #180 x 0   Entered and Authorized by:   Edd Arbour   Signed by:   Edd Arbour on 10/20/2009   Method used:   Faxed to ...       [facility] (retail)       [address]       [place], [place]  [address]       Ph: [phone]       Fax: [phone]   RxID:   [id number] HYZAAR 100-25 MG  TABS (LOSARTAN POTASSIUM-HCTZ) 1 tab by mouth daily  #30 x 3   Entered and  Authorized by:   Edd Arbour   Signed by:   Edd Arbour on 10/20/2009   Method used:   Faxed to ...       [facility] (retail)       [address]       [place], [place]  [address]       Ph: [phone]       Fax: [phone]   RxID:   [id number] METOPROLOL TARTRATE 100 MG  TABS (METOPROLOL TARTRATE) 1 tab by mouth bid  #90 x 0   Entered and Authorized by:  Edd Arbour   Signed by:   Edd Arbour on 10/20/2009   Method used:   Faxed to ...       [facility] (retail)       [address]       [place], [place]  [id number]       Ph: [phone]       Fax: [phone]   RxID:   [id number]   Laboratory Results   Blood Tests   Date/Time Received: October 20, 2009 2:28 PM  Date/Time Reported: October 20, 2009 3:06 PM   HGBA1C: 6.5%   (Normal Range: Non-Diabetic - 3-6%   Control Diabetic - 6-8%)  Comments: ...............test performed by......Marland KitchenBonnie A. Swaziland, MLS (ASCP)cm

## 2010-03-06 NOTE — Letter (Signed)
Summary: Generic Letter  [facility]  [address]   [place], [place] [address]   Phone: [phone]  Fax: [phone]    11/24/2009 MRN: [id number]  [address] [place], [place]  [address]  Dear Ms. Orland Jarred,  I reviewed your labs that we did last wednesday. Your thyroid is functioning perfectly and that is not the cause of your fatigue.  We also checked your blood count to see if anemia was the problem. Your blood count if on the low end of normal, but not enough to be the cause of your fatigue.  If you are still concerned about your fatigue, we can meet again in my office and try and figure out what else may be the cause.  Sincerely,   Edd Arbour M.D. [facility]

## 2010-03-06 NOTE — Assessment & Plan Note (Signed)
Summary: fever blisters,df   Vital Signs:  Patient profile:   70 year old female Weight:      199.1 pounds Temp:     97.8 degrees F oral Pulse rate:   67 / minute Pulse rhythm:   regular BP sitting:   138 / 83  (left arm) Cuff size:   large  Vitals Entered By: Loralee Pacas CMA (May 03, 2009 2:01 PM)  Primary Care Provider:  Magnus Ivan MD  CC:  headacye and fever blister.  History of Present Illness: 70 y/o female here c/o  headache- R temporal. no associated diplopia, jaw claudication. h/o similar headache on L side. underwent temporal artery bx which was normal. has not tried any medications. no slurred speech, blurred vision, numbness/tingling or other neuro symptoms. no myalgias or arthralgias.   blister- upper lip. 3-4 days. tried vaseline and alcohol. no drainage, pain. no difficulty eating.   DM- due for A1C. no symptoms of hypo or hyperglycemia. on glucophage.   Current Medications (verified): 1)  Glucophage 500 Mg Tabs (Metformin Hcl) .... Take 1 Tablet By Mouth Twice A Day 2)  Hyzaar 100-25 Mg  Tabs (Losartan Potassium-Hctz) .Marland Kitchen.. 1 Tab By Mouth Daily 3)  Metoprolol Tartrate 50 Mg  Tabs (Metoprolol Tartrate) .Marland Kitchen.. 1 Tab By Mouth Bid 4)  Aspirin Buffered 325 Mg  Tabs (Aspirin Buff(Mgcarb-Alaminoac)) .Marland Kitchen.. 1 Tab By Mouth Daily 5)  Zocor 40 Mg  Tabs (Simvastatin) .Marland Kitchen.. 1 Tab By Mouth At Bedtime 6)  Accucheck Test Strips .... Use To Test Blood Sugar Twice Daily 7)  Nexium 40 Mg Cpdr (Esomeprazole Magnesium) .... Take 1 Tab Each Morning Failed Prilosec.  H/o Pud (533.90) 8)  Nasonex 50 Mcg/act Susp (Mometasone Furoate) .... 2 Sprays Per Nostrial Every Day 9)  Loratadine 10 Mg Tabs (Loratadine) .Marland Kitchen.. 1 Tab By Mouth Daily 10)  Tramadol-Acetaminophen 37.5-325 Mg Tabs (Tramadol-Acetaminophen) .... One Tab Q 4-6 Hours As Needed For Pain  Allergies (verified): 1)  ! Penicillin 2)  ! Codeine 3)  ! Sulfa 4)  Amoxicillin (Amoxicillin) 5)  Codeine Phosphate (Codeine  Phosphate) 6)  Sulfamethoxazole (Sulfamethoxazole)  Past History:  Past medical history reviewed for relevance to current acute and chronic problems. Past surgical history reviewed for relevance to current acute and chronic problems.  Past Medical History: Reviewed history from 10/23/2008 and no changes required. allergic rhinitis, htn h/o temporal arteritis at age 33 and pt recently had a fall on 08/14/2008 (updated on 10/21/2008)  Past Surgical History: Reviewed history from 08/04/2008 and no changes required. not reviewed on this appointment  Physical Exam  General:  elderly female, NAD. vitals reviewed.  Head:  +TTP over R temple. no throbbing of artery noted. no pain over TMJ area with opening and closing of jaw  Mouth:  MMM; small area of ulcer in midline of upper lip.  Lungs:  Normal respiratory effort, chest expands symmetrically. Lungs are clear to auscultation, no crackles or wheezes. Heart:  Normal rate and regular rhythm. S1 and S2 normal without gallop, murmur, click, rub or other extra sounds. Abdomen:  Bowel sounds positive,abdomen soft and non-tender without masses, organomegaly or hernias noted. Extremities:  no edema of BLE   Impression & Recommendations:  Problem # 1:  HEADACHE (ICD-784.0) Assessment New normal sed rate. less concerning for temporal arteritis given normal sed rate, no diplopia or jaw claudication in [place] American female. recommend NSAIDs/tylenol. f/u if persists/worsens.   Her updated medication list for this problem includes:    Metoprolol Tartrate 50 Mg  Tabs (Metoprolol tartrate) .Marland Kitchen... 1 tab by mouth bid    Aspirin Buffered 325 Mg Tabs (Aspirin buff(mgcarb-alaminoac)) .Marland Kitchen... 1 tab by mouth daily    Tramadol-acetaminophen 37.5-325 Mg Tabs (Tramadol-acetaminophen) ..... 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 no treatment.  Orders: [facility]- Est  Level 4 (87564)  Problem # 3:  DIABETES MELLITUS II, UNCOMPLICATED (ICD-250.00) Assessment: Unchanged A1C at goal. no changes.   Her updated medication list for this problem includes:    Glucophage 500 Mg Tabs (Metformin hcl) .Marland Kitchen... Take 1 tablet by mouth twice a day    Hyzaar 100-25 Mg Tabs (Losartan potassium-hctz) .Marland Kitchen... 1 tab by mouth daily    Aspirin Buffered 325 Mg Tabs (Aspirin buff(mgcarb-alaminoac)) .Marland Kitchen... 1 tab by mouth daily  Orders: A1C-[facility] (33295) [facility]- Est  Level 4 (18841)  Other Orders: Direct LDL-[facility] (66063-01601) Prescriptions: METOPROLOL TARTRATE 50 MG  TABS (METOPROLOL TARTRATE) 1 tab by mouth bid  #60 Tablet x 2   Entered and Authorized by:   Lequita Asal  MD   Signed by:   Lequita Asal  MD on 05/03/2009   Method used:   Electronically to        [facility] (retail)       [address]       [place], [place]  [id number]       Ph: [phone]       Fax: [phone]   RxID:   [id number] HYZAAR 100-25 MG  TABS (LOSARTAN POTASSIUM-HCTZ) 1 tab by mouth daily  #30 x 3   Entered and Authorized by:   Lequita Asal  MD   Signed by:   Lequita Asal  MD on 05/03/2009   Method used:   Electronically to        [facility] (retail)       [address]       [place], [place]  [id number]       Ph: [phone]       Fax: [phone]   RxID:   [id number]   Laboratory Results   Blood Tests   Date/Time Received: May 03, 2009 2:47 PM  Date/Time Reported: May 03, 2009 3:58 PM    HGBA1C: 6.5%   (Normal Range: Non-Diabetic - 3-6%   Control Diabetic - 6-8%) SED rate: 42 mm/hr  Comments: ...............test performed by......Marland KitchenBonnie A. [place], MLS (ASCP)cm       Prevention & Chronic Care Immunizations   Influenza vaccine: Fluvax 3+  (11/29/2008)   Influenza vaccine due: 01/09/2008    Tetanus booster: 04/04/2001: Done.    Tetanus booster due: 04/05/2011    Pneumococcal vaccine: Pneumovax (Medicare)  (01/22/2007)   Pneumococcal vaccine due: None    H. zoster vaccine: Not documented  Colorectal Screening   Hemoccult: Done.  (08/04/2001)   Hemoccult due: Not Indicated    Colonoscopy: Not documented   Colonoscopy due: 12/24/2012  Other Screening   Pap smear: Done.  (11/11/1996)   Pap smear due: Not Indicated    Mammogram: Done.  (03/14/2006)   Mammogram due: 03/15/2007    DXA bone density scan: Done.  (02/11/2006)   DXA scan due: None    Smoking status: never  (11/29/2008)  Diabetes Mellitus   HgbA1C: 6.5  (05/03/2009)   Hemoglobin A1C due: 08/25/2007    Eye exam: Not documented  Foot exam: Not documented   High risk foot: Not documented   Foot care education: Not documented    Urine microalbumin/creatinine ratio: Not documented    Diabetes flowsheet reviewed?: Yes   Progress toward A1C goal: At goal  Lipids   Total Cholesterol: 149  (05/11/2008)   LDL: 77  (05/11/2008)   LDL Direct: Not documented   HDL: 49  (05/11/2008)   Triglycerides: 116  (05/11/2008)    SGOT (AST): 20  (02/24/2009)   SGPT (ALT): 14  (02/24/2009)   Alkaline phosphatase: 65  (02/24/2009)   Total bilirubin: 0.4  (02/24/2009)    Lipid flowsheet reviewed?: Yes   Progress toward LDL goal: At goal  Hypertension   Last Blood Pressure: 138 / 83  (05/03/2009)   Serum creatinine: 0.79  (02/24/2009)   Serum potassium 3.7  (02/24/2009)    Hypertension flowsheet reviewed?: Yes   Progress toward BP goal: At goal  Self-Management Support :   Personal Goals (by the next clinic visit) :     Personal A1C goal: 7  (05/03/2009)     Personal blood pressure goal: 140/90  (05/03/2009)     Personal LDL goal: 100  (05/03/2009)    Diabetes self-management support: Not documented    Diabetes self-management support not done because: Good outcomes  (05/03/2009)    Hypertension self-management support: Not  documented    Hypertension self-management support not done because: Good outcomes  (05/03/2009)    Lipid self-management support: Not documented     Lipid self-management support not done because: Good outcomes  (05/03/2009)

## 2010-03-08 NOTE — Progress Notes (Signed)
  Phone Note Call from Patient   Summary of Call: Check blood sugar and was 135, check it again several hours later and it is 145.  Concerned it is going too high.  Reassured her this is not dangerous.  Only takes metformin, no insulin.  Advised her to call office in AM to make appointment with her PCP to discuss concerns further. Initial call taken by: Delbert Harness MD,  February 14, 2010 8:59 PM

## 2010-03-08 NOTE — Assessment & Plan Note (Signed)
Summary: cold symptoms x 1 month/eo   Vital Signs:  Patient profile:   70 year old female Height:      60.5 inches Weight:      200.8 pounds BMI:     38.71 Temp:     98.4 degrees F oral Pulse rate:   92 / minute Pulse (ortho):   62 / minute BP sitting:   150 / 85  (left arm) BP standing:   144 / 84 Cuff size:   regular  Vitals Entered By: Garen Grams LPN (February 13, 2010 10:21 AM)  Serial Vital Signs/Assessments:  Time      Position  BP       Pulse  Resp  Temp     By 11:09 AM  Lying LA  149/89   62                    Jessica Fleeger CMA 11:09 AM  Sitting   158/85   58                    Jessica Fleeger CMA 11:09 AM  Standing  144/84   62                    Jessica Fleeger CMA  CC: cough/congestion/dizziness x 1 month Is Patient Diabetic? Yes Did you bring your meter with you today? No Pain Assessment Patient in pain? no        Primary Provider:  Edd Arbour  CC:  cough/congestion/dizziness x 1 month.  History of Present Illness: pt presents with "cold" symptoms for one month that she describes as congestion along with non productive cough, rhinorrhea, and postnasal drip.  she has a history of allergic rhinitis for which she tried nasonex with good results.  she currently denies trying any allergy medication, however, she has tried mucinex and tylenol. she denies any fever, chills, nausea, vomiting.    additionally pt states for the past few days she has been having intermittent lightheadedness with position changes.  she denies any headache or vision changes.  she is currently on metoprolol and losartan/hctz.    Preventive Screening-Counseling & Management  Alcohol-Tobacco     Smoking Status: never     Year Quit: 1981     Passive Smoke Exposure: yes  Allergies: 1)  ! Penicillin 2)  ! Codeine 3)  ! Sulfa 4)  Amoxicillin (Amoxicillin) 5)  Codeine Phosphate (Codeine Phosphate) 6)  Sulfamethoxazole (Sulfamethoxazole)  Past History:  Past medical, surgical,  family and social histories (including risk factors) reviewed, and no changes noted (except as noted below).  Past Medical History: Reviewed history from 10/23/2008 and no changes required. allergic rhinitis, htn h/o temporal arteritis at age 51 and pt recently had a fall on 08/14/2008 (updated on 10/21/2008)  Past Surgical History: Reviewed history from 08/04/2008 and no changes required. not reviewed on this appointment  Family History: Reviewed history from 11/21/2009 and no changes required. 2 living brothers 1? with htn 5 living sisters: 2 with diabetes, all with htn F deceased @ 70, MI M deceased @ 80, unknown cancer htn, diabetes children: 2 sons with diabetes and heart problems, 2 sons with htn, and daughter with htn and borderline  3 sisters died with pancreatic cancer (updated 10/21/2008)  Social History: Reviewed history from 11/21/2009 and no changes required. No tob, EtOH, drugs.  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  5 children, lives alone.   Pt exercises by walking every other day.  Physical Exam  General:  Well-developed,well-nourished,in no acute distress; alert,appropriate and cooperative throughout examination Ears:  External ear exam shows no significant lesions or deformities.  Otoscopic examination reveals clear canals, tympanic membranes are intact bilaterally without bulging, retraction, inflammation or discharge. Hearing is grossly normal bilaterally. Nose:  no nasal discharge, no nasal polyps, no nasal mucosal lesions, no sinus percussion tenderness, no septum abnormalities, mucosal erythema, and mucosal edema.   Mouth:  Oral mucosa and oropharynx without lesions or exudates.  Teeth in good repair. Lungs:  Normal respiratory effort, chest expands symmetrically. Lungs are clear to auscultation, no crackles or wheezes. Heart:  Normal rate and regular rhythm. S1 and S2 normal without gallop,  murmur, click, rub or other extra sounds.   Impression & Recommendations:  Problem # 1:  ALLERGIC RHINITIS (ICD-477.9) Assessment Unchanged  Her updated medication list for this problem includes:    Nasonex 50 Mcg/act Susp (Mometasone furoate) .Marland Kitchen... 2 sprays per nostril daily for allergic rhinitis    Zyrtec Allergy 10 Mg Tabs (Cetirizine hcl) .Marland Kitchen... 1/2 tab by mouth daily for allergies  continue supportive care.   Orders: [facility]- Est Level  3 (04540)  Problem # 2:  POSTURAL LIGHTHEADEDNESS (ICD-780.4) Assessment: Unchanged  pt currently not orthostatic.  and vital signs are stable.  pt given warnings with position changes.  if symptoms continue may recommend a CBC to assess anemia as well as B12, TSH.    Orders: [facility]- Est Level  3 (98119)  Complete Medication List: 1)  Glucophage 500 Mg Tabs (Metformin hcl) .... Take 1 tablet by mouth twice a day 2)  Hyzaar 100-25 Mg Tabs (Losartan potassium-hctz) .Marland Kitchen.. 1 tab by mouth daily 3)  Metoprolol Tartrate 100 Mg Tabs (metoprolol Tartrate)  .Marland Kitchen.. 1 tab by mouth bid 4)  Aspirin Buffered 325 Mg Tabs (Aspirin buff(mgcarb-alaminoac)) .Marland Kitchen.. 1 tab by mouth daily 5)  Zocor 40 Mg Tabs (Simvastatin) .Marland Kitchen.. 1 tab by mouth at bedtime 6)  Accucheck Test Strips  .... Use to test blood sugar twice daily 7)  Nexium 40 Mg Cpdr (Esomeprazole magnesium) .... Take 1 tab each morning failed prilosec.  h/o pud (533.90) 8)  Citalopram Hydrobromide 20 Mg Tabs (Citalopram hydrobromide) .... Take 1 by mouth daily to help with hot flashes. 9)  Ibuprofen 400 Mg Tabs (Ibuprofen) .... One by mouth three times a day as needed back pain.  take with food. 10)  Ferotrinsic Caps (Fe fumarate-b12-vit c-fa-ifc) .... Take one tab by mouth daily with meals. 11)  Tramadol-acetaminophen 37.5-325 Mg Tabs (Tramadol-acetaminophen) .... One at bedtime as needed for back pain 12)  Nasonex 50 Mcg/act Susp (Mometasone furoate) .... 2 sprays per nostril daily for allergic rhinitis 13)  Zyrtec  Allergy 10 Mg Tabs (Cetirizine hcl) .... 1/2 tab by mouth daily for allergies  Other Orders: EMR Misc Charge Code [facility])  Patient Instructions: 1)  it was a pleasure to care for you today.  2)  please make a follow up appointment in 2-3 months. 3)  started an allergy medication and nasal spray.  you may initially use the nasal spray only and then use the allergy pill as needed.  the allergy pill may make you sleepy to start taking it at night.  4)  please be careful with quick position changes.   5)  continue your blood pressure medication as instructed.  Prescriptions: ZYRTEC ALLERGY 10 MG TABS (CETIRIZINE HCL) 1/2 tab by mouth daily  for allergies  #30 x 0   Entered and Authorized by:   Maryelizabeth Kaufmann MD   Signed by:   Maryelizabeth Kaufmann MD on 02/13/2010   Method used:   Electronically to        [facility] (retail)       [address]       [place], [place]  [address]       Ph: [phone]       Fax: [phone]   RxID:   [id number] NASONEX 50 MCG/ACT SUSP (MOMETASONE FUROATE) 2 sprays per nostril daily for allergic rhinitis  #1 bottle x 0   Entered and Authorized by:   Maryelizabeth Kaufmann MD   Signed by:   Maryelizabeth Kaufmann MD on 02/13/2010   Method used:   Electronically to        [facility] (retail)       [address]       [place], [place]  [id number]       Ph: [phone]       Fax: [phone]   RxID:   [id number]    Orders Added: 1)  EMR Misc Charge Code [EMRMisc] 2)  [facility]- Est Level  3 [30160]

## 2010-03-08 NOTE — Assessment & Plan Note (Signed)
Summary: body aches/orton/bmc   Vital Signs:  Patient profile:   70 year old female Height:      60.5 inches Weight:      199 pounds BMI:     38.36 Temp:     98.1 degrees F oral Pulse rate:   63 / minute BP sitting:   131 / 75  (right arm) Cuff size:   regular  Vitals Entered By: Tessie Fass CMA (January 22, 2010 1:47 PM) CC: lower back pain x 1 week   Primary Care Mozell Haber:  Edd Arbour  CC:  lower back pain x 1 week.  History of Present Illness: The right side of her back has been popping, for weeks.  Back hurts worse at night and sometimes she cannot sleep.  She walks daily to the grocery store, to the drug store.  She worked all of her life and would like to work a job now.  Cyclobenzaprine does not help, neither does the ibuprofen.    Current Medications (verified): 1)  Glucophage 500 Mg Tabs (Metformin Hcl) .... Take 1 Tablet By Mouth Twice A Day 2)  Hyzaar 100-25 Mg  Tabs (Losartan Potassium-Hctz) .Marland Kitchen.. 1 Tab By Mouth Daily 3)  Metoprolol Tartrate 100 Mg  Tabs (Metoprolol Tartrate) .Marland Kitchen.. 1 Tab By Mouth Bid 4)  Aspirin Buffered 325 Mg  Tabs (Aspirin Buff(Mgcarb-Alaminoac)) .Marland Kitchen.. 1 Tab By Mouth Daily 5)  Zocor 40 Mg  Tabs (Simvastatin) .Marland Kitchen.. 1 Tab By Mouth At Bedtime 6)  Accucheck Test Strips .... Use To Test Blood Sugar Twice Daily 7)  Nexium 40 Mg Cpdr (Esomeprazole Magnesium) .... Take 1 Tab Each Morning Failed Prilosec.  H/o Pud (533.90) 8)  Citalopram Hydrobromide 20 Mg Tabs (Citalopram Hydrobromide) .... Take 1 By Mouth Daily To Help With Hot Flashes. 9)  Ibuprofen 400 Mg Tabs (Ibuprofen) .... One By Mouth Three Times A Day As Needed Back Pain.  Take With Food. 10)  Ferotrinsic  Caps (Fe Fumarate-B12-Vit C-Fa-Ifc) .... Take One Tab By Mouth Daily With Meals. 11)  Tramadol-Acetaminophen 37.5-325 Mg Tabs (Tramadol-Acetaminophen) .... One At Bedtime As Needed For Back Pain  Allergies (verified): 1)  ! Penicillin 2)  ! Codeine 3)  ! Sulfa 4)  Amoxicillin  (Amoxicillin) 5)  Codeine Phosphate (Codeine Phosphate) 6)  Sulfamethoxazole (Sulfamethoxazole)  Review of Systems      See HPI General:  Denies chills and fever. MS:  Complains of low back pain; denies loss of strength, muscle weakness, and stiffness.  Physical Exam  General:  Elderly obese AA female Msk:  Good ROM of lower back.  Did have point tenderness in the right SI notch.  She was able to go through multiple maneuvers that included stretching back and LE stretches without pain.  5/5 LE muscle strenght   Impression & Recommendations:  Problem # 1:  BACK PAIN (ICD-724.5)  Chronic problem, gave exercises to begin and offered physical therapy.  She will call if she choses to go to therapy.  One tramadol.acetaminophen combo per day at bedtime.  Follow up with Dr. Rivka Safer as needed. The following medications were removed from the medication list:    Cyclobenzaprine Hcl 5 Mg Tabs (Cyclobenzaprine hcl) .Marland Kitchen... 1-2 by mouth as needed up to three times a day for muscle pain Her updated medication list for this problem includes:    Aspirin Buffered 325 Mg Tabs (Aspirin buff(mgcarb-alaminoac)) .Marland Kitchen... 1 tab by mouth daily    Ibuprofen 400 Mg Tabs (Ibuprofen) ..... One by mouth three times a day as  needed back pain.  take with food.    Tramadol-acetaminophen 37.5-325 Mg Tabs (Tramadol-acetaminophen) ..... One at bedtime as needed for back pain  Orders: [facility]- Est Level  3 (95621)  Complete Medication List: 1)  Glucophage 500 Mg Tabs (Metformin hcl) .... Take 1 tablet by mouth twice a day 2)  Hyzaar 100-25 Mg Tabs (Losartan potassium-hctz) .Marland Kitchen.. 1 tab by mouth daily 3)  Metoprolol Tartrate 100 Mg Tabs (metoprolol Tartrate)  .Marland Kitchen.. 1 tab by mouth bid 4)  Aspirin Buffered 325 Mg Tabs (Aspirin buff(mgcarb-alaminoac)) .Marland Kitchen.. 1 tab by mouth daily 5)  Zocor 40 Mg Tabs (Simvastatin) .Marland Kitchen.. 1 tab by mouth at bedtime 6)  Accucheck Test Strips  .... Use to test blood sugar twice daily 7)  Nexium 40 Mg Cpdr  (Esomeprazole magnesium) .... Take 1 tab each morning failed prilosec.  h/o pud (533.90) 8)  Citalopram Hydrobromide 20 Mg Tabs (Citalopram hydrobromide) .... Take 1 by mouth daily to help with hot flashes. 9)  Ibuprofen 400 Mg Tabs (Ibuprofen) .... One by mouth three times a day as needed back pain.  take with food. 10)  Ferotrinsic Caps (Fe fumarate-b12-vit c-fa-ifc) .... Take one tab by mouth daily with meals. 11)  Tramadol-acetaminophen 37.5-325 Mg Tabs (Tramadol-acetaminophen) .... One at bedtime as needed for back pain  Patient Instructions: 1)  Do the stretching exercises every day 2)  Take the tramadol/acetaminophen pill at bedtime as needed 3)  Try using ice to the area 4)  If you want to start physical therapy, just call and we will order it. Prescriptions: TRAMADOL-ACETAMINOPHEN 37.5-325 MG TABS (TRAMADOL-ACETAMINOPHEN) one at bedtime as needed for back pain  #31 x 3   Entered and Authorized by:   Luretha [name] NP   Signed by:   Luretha [name] NP on 01/22/2010   Method used:   Electronically to        [facility] (retail)       [address]       [place], [place]  [id number]       Ph: [phone]       Fax: [phone]   RxID:   [id number]    Orders Added: 1)  [facility]- Est Level  3 [87564]

## 2010-03-12 ENCOUNTER — Inpatient Hospital Stay (HOSPITAL_COMMUNITY)
Admission: RE | Admit: 2010-03-12 | Discharge: 2010-03-12 | Disposition: A | Discharge status: Home / Self Care | Payer: BC Managed Care – PPO | Source: Ambulatory Visit | Attending: Emergency Medicine | Admitting: Emergency Medicine

## 2010-03-21 ENCOUNTER — Emergency Department (HOSPITAL_COMMUNITY): Payer: MEDICARE

## 2010-03-21 ENCOUNTER — Ambulatory Visit (HOSPITAL_COMMUNITY)
Admission: EM | Admit: 2010-03-21 | Discharge: 2010-03-22 | Disposition: A | Discharge status: Home / Self Care | Payer: MEDICARE | Source: Ambulatory Visit | Attending: Interventional Cardiology | Admitting: Interventional Cardiology

## 2010-03-21 DIAGNOSIS — Z01810 Encounter for preprocedural cardiovascular examination: Secondary | ICD-10-CM | POA: Insufficient documentation

## 2010-03-21 DIAGNOSIS — E119 Type 2 diabetes mellitus without complications: Secondary | ICD-10-CM | POA: Insufficient documentation

## 2010-03-21 DIAGNOSIS — I10 Essential (primary) hypertension: Secondary | ICD-10-CM | POA: Insufficient documentation

## 2010-03-21 DIAGNOSIS — Z01818 Encounter for other preprocedural examination: Secondary | ICD-10-CM | POA: Insufficient documentation

## 2010-03-21 DIAGNOSIS — R0789 Other chest pain: Secondary | ICD-10-CM | POA: Insufficient documentation

## 2010-03-21 DIAGNOSIS — Z01812 Encounter for preprocedural laboratory examination: Secondary | ICD-10-CM | POA: Insufficient documentation

## 2010-03-21 LAB — HEPATIC FUNCTION PANEL
ALT: 19 U/L (ref 0–35)
Bilirubin, Direct: <0.1 mg/dL (ref 0.0–0.3)
Total Bilirubin: 0.4 mg/dL (ref 0.3–1.2)

## 2010-03-21 LAB — POCT CARDIAC MARKERS: Troponin i, poc: 0.13 ng/mL — ABNORMAL HIGH (ref 0.00–0.09)

## 2010-03-21 LAB — POCT I-STAT, CHEM 8
BUN: 10 mg/dL (ref 6–23)
Calcium, Ion: 1.06 mmol/L — ABNORMAL LOW (ref 1.12–1.32)
Chloride: 106 mEq/L (ref 96–112)
Creatinine, Ser: 0.9 mg/dL (ref 0.4–1.2)

## 2010-03-21 LAB — DIFFERENTIAL
Lymphs Abs: 4.9 10*3/uL — ABNORMAL HIGH (ref 0.7–4.0)
Monocytes Relative: 8 % (ref 3–12)
Neutro Abs: 2.3 10*3/uL (ref 1.7–7.7)
Neutrophils Relative %: 28 % — ABNORMAL LOW (ref 43–77)

## 2010-03-21 LAB — URINALYSIS, ROUTINE W REFLEX MICROSCOPIC
Bilirubin Urine: NEGATIVE
Hgb urine dipstick: NEGATIVE
Ketones, ur: NEGATIVE mg/dL
Protein, ur: NEGATIVE mg/dL
Urobilinogen, UA: 0.2 mg/dL (ref 0.0–1.0)

## 2010-03-21 LAB — CBC
Hemoglobin: 10.9 g/dL — ABNORMAL LOW (ref 12.0–15.0)
MCH: 25 pg — ABNORMAL LOW (ref 26.0–34.0)
MCV: 79.4 fL (ref 78.0–100.0)
RBC: 4.36 MIL/uL (ref 3.87–5.11)

## 2010-03-22 LAB — BASIC METABOLIC PANEL
BUN: 10 mg/dL (ref 6–23)
CO2: 27 mEq/L (ref 19–32)
Calcium: 8.6 mg/dL (ref 8.4–10.5)
GFR calc non Af Amer: 58 mL/min — ABNORMAL LOW (ref >60)
Glucose, Bld: 114 mg/dL — ABNORMAL HIGH (ref 70–99)
Potassium: 3.5 mEq/L (ref 3.5–5.1)

## 2010-03-22 LAB — CBC
MCH: 24.7 pg — ABNORMAL LOW (ref 26.0–34.0)
MCHC: 31.3 g/dL (ref 30.0–36.0)
RDW: 14.8 % (ref 11.5–15.5)

## 2010-03-22 LAB — CK TOTAL AND CKMB (NOT AT ARMC)
CK, MB: 0.9 ng/mL (ref 0.3–4.0)
Relative Index: INVALID (ref 0.0–2.5)
Total CK: 86 U/L (ref 7–177)

## 2010-03-22 LAB — DIFFERENTIAL
Basophils Absolute: 0 10*3/uL (ref 0.0–0.1)
Basophils Relative: 0 % (ref 0–1)
Eosinophils Relative: 4 % (ref 0–5)
Monocytes Absolute: 0.6 10*3/uL (ref 0.1–1.0)
Monocytes Relative: 7 % (ref 3–12)

## 2010-03-22 LAB — TROPONIN I: Troponin I: 0.01 ng/mL (ref 0.00–0.06)

## 2010-03-22 LAB — CARDIAC PANEL(CRET KIN+CKTOT+MB+TROPI)
CK, MB: 1 ng/mL (ref 0.3–4.0)
Relative Index: 1 (ref 0.0–2.5)

## 2010-03-22 LAB — HEPARIN LEVEL (UNFRACTIONATED)
Heparin Unfractionated: 0.11 IU/mL — ABNORMAL LOW (ref 0.30–0.70)
Heparin Unfractionated: 0.25 IU/mL — ABNORMAL LOW (ref 0.30–0.70)

## 2010-03-22 LAB — D-DIMER, QUANTITATIVE: D-Dimer, Quant: 0.23 ug/mL-FEU (ref 0.00–0.48)

## 2010-03-22 LAB — GLUCOSE, CAPILLARY: Glucose-Capillary: 129 mg/dL — ABNORMAL HIGH (ref 70–99)

## 2010-03-22 LAB — PROTIME-INR: Prothrombin Time: 13.7 seconds (ref 11.6–15.2)

## 2010-03-29 ENCOUNTER — Other Ambulatory Visit: Payer: Self-pay | Admitting: Family Medicine

## 2010-03-29 DIAGNOSIS — I10 Essential (primary) hypertension: Secondary | ICD-10-CM

## 2010-03-29 NOTE — Telephone Encounter (Signed)
Refill request

## 2010-04-02 ENCOUNTER — Telehealth: Payer: Self-pay | Admitting: Family Medicine

## 2010-04-02 NOTE — Telephone Encounter (Signed)
Patient notified that RX is at pharmacy ready to pick up.

## 2010-04-02 NOTE — Telephone Encounter (Signed)
Pt asking to speak with RN re: her medication losartanactz, is out & called into pharmacy last week.

## 2010-04-13 NOTE — Discharge Summary (Signed)
  NAMEALLAHNA, HUSBAND NO.:  [id number]  MEDICAL RECORD NO.:  [id number]           PATIENT TYPE:  O  LOCATION:  [facility]                         FACILITY:  [facility]  PHYSICIAN:  Corky Crafts, MDDATE OF BIRTH:  May 03, 1940  DATE OF ADMISSION:  03/21/2010 DATE OF DISCHARGE:  03/22/2010                              DISCHARGE SUMMARY   PRIMARY CARE PHYSICIAN:  [facility].  FINAL DIAGNOSES: 1. Noncardiac chest pain. 2. Diabetes. 3. Hypertension. 4. Unstable angina.  PROCEDURES:  Cardiac catheterization showing no significant coronary artery disease, normal left ventricular function, and no abdominal aortic aneurysm.  HOSPITAL COURSE:  The patient was admitted with symptoms worrisome for unstable angina.  She had initial point-of-care troponin which was abnormal in the emergency room.  She was started on heparin and nitroglycerin.  Her symptoms improved.  She underwent cardiaccatheterization on February 16 from the right radial approach.  She had no significant coronary artery disease.  No intervention was performed. She had normal left ventricular function and no abdominal aortic aneurysm.  There was a slight drop in her hemoglobin, this was thought to be dilutional from receiving IV fluids.  There were no overt signs of bleeding.  Her troponins in the hospital were negative.  Her D-dimer was 0.23.  Creatinine was 0.95.  After catheterization, she was deemed ready for discharge.  Her symptoms resolved at that point.  DISCHARGE MEDICATIONS: 1. Aspirin 81 mg daily. 2. Metoprolol, decreased dose to 50 mg b.i.d. 3. Citalopram 20 mg daily. 4. Flexeril p.r.n. 5. Hyzaar 100/25 one tablet p.o. daily. 6. Ibuprofen p.r.n. 7. Metformin 500 mg b.i.d. 8. Nexium 40 mg daily. 9. Zocor 40 mg daily.  FOLLOWUP APPOINTMENTS:  With Dr. Eldridge Dace on April 06, 2010.  She will also follow up with her primary care physician.  DIET:  Low-sodium, heart-healthy  diet, also diabetic diet.  ACTIVITY:  Increase activity slowly.  She will follow the post radial cath instructions.  SPECIAL INSTRUCTIONS:  We will check a CBC when she comes into the office, to make sure that her hemoglobin has not dropped further.     Corky Crafts, MD     JSV/MEDQ  D:  03/22/2010  T:  03/23/2010  Job:  [id number]  Electronically Signed by Lance Muss MD on 04/12/2010 10:00:31 AM

## 2010-04-13 NOTE — Procedures (Signed)
Amber [name], COSTE NO.:  [id number]  MEDICAL RECORD NO.:  [id number]           PATIENT TYPE:  LOCATION:                                 FACILITY:  PHYSICIAN:  Corky Crafts, MDDATE OF BIRTH:  1940-02-13  DATE OF PROCEDURE: DATE OF DISCHARGE:                           CARDIAC CATHETERIZATION   PROCEDURES PERFORMED: 1. Left heart catheterization. 2. Left ventriculogram. 3. Coronary angiogram. 4. Abdominal aortogram.  OPERATOR:  Corky Crafts, MD  INDICATION:  Unstable angina.  PROCEDURE:  After the risks and benefits of cardiac catheterization were explained to the patient, informed consent was obtained.  She was brought to the cath lab, she was prepped and draped in the usual sterile fashion.  Her right wrist was infiltrated with 1% lidocaine, a 5-French glide sheath was placed into the right radial artery using modified Seldinger technique.  Right coronary artery angiography was performed using a JR-4.0 catheter.  The catheter was advanced to the vessel ostium under fluoroscopic guidance.  Digital angiography was performed in multiple projections using hand injection of contrast.  Left coronary artery angiography was performed in a similar fashion using a JL-3.5 catheter.  A pigtail catheter was advanced to the ascending aorta and across the aortic valve under fluoroscopic guidance.  Power injection of contrast was performed in the RAO projection and imaged the left ventricle.  The catheter was pulled back under continuous hemodynamic pressure monitoring.  Catheter was then advanced to the abdominal aorta, and a power injection of contrast was performed in the AP projection. The sheath was removed using a TR band for hemostasis.  FINDINGS:  There was spasm in the right radial artery.  There is likely a high bifurcation of the ulnar artery and radial and angiogram was performed through the JR-4 catheter to visualize this.  This area  was treated with intra-arterial nitroglycerin to relieve the spasm. The right coronary artery is a nondominant vessel but widely patent. The left main is a large vessel which appeared angiographically normal. The left circumflex is a large dominant vessel.  There is a large OM-1 which had a focal area of mild atherosclerosis.  The remainder of the circumflex which goes into the PDA appeared angiographically normal. Left anterior descending is a large vessel which wraps around the apex. There are mild luminal irregularities but no significant coronary artery disease.  There are two medium-sized diagonals, both of which appear widely patent. Left ventriculogram shows normal left ventricular function with an ejection fraction of 60%.  The ascending aorta appears normal. The abdominal aortogram shows no abdominal aortic aneurysm.  There are bilateral single renal arteries, both of which are widely patent.  The aortoiliac bifurcation appears widely patent as well.  HEMODYNAMIC RESULTS:  Left ventricular pressure 124/10 with an LVEDP of 18 mmHg.  Aortic pressure 118/64 with a mean aortic pressure of 87 mmHg.  IMPRESSION: 1. No significant coronary artery disease. 2. Normal left ventricular function. 3. No abdominal aortic aneurysm or renal artery stenosis.  RECOMMENDATIONS:  Continue preventive therapy and treatment for risk factors including high blood pressure and diabetes.     Corky Crafts, MD  JSV/MEDQ  D:  03/22/2010  T:  03/22/2010  Job:  [id number]  cc:   [facility]  Electronically Signed by Lance Muss MD on 04/12/2010 10:00:42 AM

## 2010-04-24 ENCOUNTER — Ambulatory Visit: Payer: BC Managed Care – PPO | Admitting: Family Medicine

## 2010-04-25 ENCOUNTER — Other Ambulatory Visit: Payer: Self-pay | Admitting: Family Medicine

## 2010-04-25 DIAGNOSIS — I10 Essential (primary) hypertension: Secondary | ICD-10-CM

## 2010-04-27 ENCOUNTER — Ambulatory Visit (INDEPENDENT_AMBULATORY_CARE_PROVIDER_SITE_OTHER): Payer: MEDICARE | Admitting: Family Medicine

## 2010-04-27 ENCOUNTER — Encounter: Payer: Self-pay | Admitting: Family Medicine

## 2010-04-27 VITALS — BP 137/76 | HR 64 | Temp 98.7°F | Wt 204.0 lb

## 2010-04-27 DIAGNOSIS — J309 Allergic rhinitis, unspecified: Secondary | ICD-10-CM

## 2010-04-27 NOTE — Patient Instructions (Signed)
It was great to see you today!  I'm sorry you aren't feeling all that well. I want you to continue using your Nasonex.  Make sure you use it every day.  I know you don't think it is working super well, but I think that if you stop it you will quickly find out that it was doing something. I also want you to start taking either Allegra or Claritin every day.  You can also try taking benadryl at night if you are having problems sleeping. If you aren't doing better in a week or two, you can call up and we can try some eye drops.

## 2010-04-29 NOTE — Assessment & Plan Note (Signed)
The patient's symptoms are most consistent with allergic rhinitis. As the patient has not been taking any antihistamines, will recommend that she start some over the counter antihistamines, and if her symptoms are not resolved, or significantly improved, within the next week to week and a half, the patient will call back and we will consider a trial of antihistamine eyedrops as she feels that her ocular symptoms are the most bothersome.

## 2010-04-29 NOTE — Progress Notes (Signed)
Subjective: The patient is a 70 year old female with multiple medical problems who presents today with congestion, coughing, sneezing, and watery eyes that had been present for the past three months. The symptoms have been intermittent, occurring 3 to 5 days per week, and have not been accompanied by any systemic symptoms. The patient has previously been diagnosed with allergies, and was started on Zyrtec, but does not recall ever taking this medicine. She has been taking Nasonex daily, but reports that this is not completely controlling her symptoms. Has tried Mucinex, Robitussin, Motrin, and aspirin, none of which have been effective at relieving her symptoms. He does not report having ever tried any over-the-counter antihistamines for her symptoms.  Review of systems: per HPI with the following additions, no nausea, vomiting, shortness of breath, chest pain, or changes in bowel or bladder habits. Otherwise 12 point review of systems was performed and was unremarkable.  Objective:  Filed Vitals:   04/27/10 1330  BP: 137/76  Pulse: 64  Temp: 98.7 F (37.1 C)   Gen: NAD, alert, cooperative with exam, obese HEENT: MMM, EOMI, PERRL.  TM clear, some nasal drainage.  Turbinates are mildly erythematous and throat shows some coblestoning.  Pharynx is nonerythematous, no tonsilar exudates.  Neck is supple without any adenopathy. CV: RRR, no MRG Resp: CTABL, no wheezes noted

## 2010-04-30 LAB — URINE MICROSCOPIC-ADD ON

## 2010-04-30 LAB — DIFFERENTIAL
Eosinophils Absolute: 0.1 10*3/uL (ref 0.0–0.7)
Lymphocytes Relative: 35 % (ref 12–46)
Lymphs Abs: 2.3 10*3/uL (ref 0.7–4.0)
Monocytes Relative: 11 % (ref 3–12)
Neutro Abs: 3.3 10*3/uL (ref 1.7–7.7)
Neutrophils Relative %: 51 % (ref 43–77)

## 2010-04-30 LAB — COMPREHENSIVE METABOLIC PANEL
ALT: 18 U/L (ref 0–35)
BUN: 18 mg/dL (ref 6–23)
CO2: 28 mEq/L (ref 19–32)
Calcium: 9.4 mg/dL (ref 8.4–10.5)
Creatinine, Ser: 1.04 mg/dL (ref 0.4–1.2)
GFR calc non Af Amer: 53 mL/min — ABNORMAL LOW (ref >60)
Glucose, Bld: 162 mg/dL — ABNORMAL HIGH (ref 70–99)
Sodium: 140 mEq/L (ref 135–145)
Total Protein: 7.6 g/dL (ref 6.0–8.3)

## 2010-04-30 LAB — URINALYSIS, ROUTINE W REFLEX MICROSCOPIC
Glucose, UA: NEGATIVE mg/dL
Hgb urine dipstick: NEGATIVE
Protein, ur: NEGATIVE mg/dL
Specific Gravity, Urine: 1.016 (ref 1.005–1.030)
pH: 5.5 (ref 5.0–8.0)

## 2010-04-30 LAB — CBC
Hemoglobin: 11.1 g/dL — ABNORMAL LOW (ref 12.0–15.0)
MCHC: 33.1 g/dL (ref 30.0–36.0)
MCV: 78.4 fL (ref 78.0–100.0)
RBC: 4.27 MIL/uL (ref 3.87–5.11)
RDW: 14.6 % (ref 11.5–15.5)

## 2010-06-12 ENCOUNTER — Encounter: Payer: Self-pay | Admitting: Family Medicine

## 2010-06-12 ENCOUNTER — Ambulatory Visit (INDEPENDENT_AMBULATORY_CARE_PROVIDER_SITE_OTHER): Payer: MEDICARE | Admitting: Family Medicine

## 2010-06-12 VITALS — BP 123/69 | HR 59 | Temp 98.0°F | Ht 60.0 in | Wt 199.4 lb

## 2010-06-12 DIAGNOSIS — N898 Other specified noninflammatory disorders of vagina: Secondary | ICD-10-CM

## 2010-06-12 DIAGNOSIS — N939 Abnormal uterine and vaginal bleeding, unspecified: Secondary | ICD-10-CM

## 2010-06-12 LAB — POCT URINALYSIS DIPSTICK
Glucose, UA: NEGATIVE
Nitrite, UA: NEGATIVE
Urobilinogen, UA: 0.2

## 2010-06-12 MED ORDER — ESTROGENS, CONJUGATED 0.625 MG/GM VA CREA: 0.5000 g | TOPICAL_CREAM | Freq: Every day | VAGINAL | Status: AC

## 2010-06-12 MED ORDER — ASPIRIN EC 81 MG PO TBEC: 81.0000 mg | DELAYED_RELEASE_TABLET | Freq: Every day | ORAL | Status: AC

## 2010-06-12 NOTE — Progress Notes (Signed)
  Subjective:    Patient ID: Amber [name], female    DOB: 1940/07/24, 70 y.o.   MRN: [id number]  Vaginal Bleeding The patient's primary symptoms include vaginal bleeding. The patient's pertinent negatives include no genital itching, genital lesions, genital odor, genital rash, missed menses, pelvic pain or vaginal discharge. This is a new problem. The current episode started in the past 7 days. The problem occurs intermittently. The problem has been gradually improving. The pain is mild. She is not pregnant. Pertinent negatives include no abdominal pain, anorexia, back pain, chills, constipation, diarrhea, discolored urine, dysuria, fever, flank pain, frequency, headaches, hematuria, joint pain, joint swelling, nausea, painful intercourse, rash, sore throat, urgency or vomiting. The vaginal bleeding is spotting. She has been passing clots. She has not been passing tissue. The symptoms are aggravated by nothing. She has tried nothing for the symptoms. She is not sexually active. She uses hysterectomy for contraception. She is postmenopausal. Her past medical history is significant for an abdominal surgery.      Review of Systems  Constitutional: Negative for fever and chills.  HENT: Negative for sore throat.   Gastrointestinal: Negative for nausea, vomiting, abdominal pain, diarrhea, constipation and anorexia.  Genitourinary: Positive for vaginal bleeding. Negative for dysuria, urgency, frequency, hematuria, flank pain, vaginal discharge, pelvic pain and missed menses.  Musculoskeletal: Negative for back pain and joint pain.  Skin: Negative for rash.  Neurological: Negative for headaches.  All other systems reviewed and are negative.       Objective:   Physical Exam  Nursing note and vitals reviewed. Constitutional: She appears well-developed and well-nourished. No distress.  Abdominal: Soft. She exhibits no distension. There is no tenderness. There is no rebound.  Genitourinary: There  is bleeding around the vagina. No tenderness around the vagina. There are signs of injury around the vagina. No vaginal discharge found.         Vaginal wall bleeding  Skin: She is not diaphoretic.        Assessment & Plan:  Pt. Is a 70 y/o aaf with history of anemia and new postmenopausal bleeding and hx of hysterectomy. 1. CBC/Iron studies 2. Speculum exam showed vaginal wall bleeding - only on ASA 81 mg 3. Referral to [facility] clinic with Dr. Jennette Kettle 4. Transvaginal/pelvic U/S

## 2010-06-13 LAB — FERRITIN: Ferritin: 34 ng/mL (ref 10–291)

## 2010-06-13 LAB — IRON AND TIBC
%SAT: 16 % — ABNORMAL LOW (ref 20–55)
Iron: 55 ug/dL (ref 42–145)
TIBC: 336 ug/dL (ref 250–470)
UIBC: 281 ug/dL

## 2010-06-13 LAB — CBC
HCT: 37.2 % (ref 36.0–46.0)
Hemoglobin: 11.5 g/dL — ABNORMAL LOW (ref 12.0–15.0)
MCH: 24.5 pg — ABNORMAL LOW (ref 26.0–34.0)
MCHC: 30.9 g/dL (ref 30.0–36.0)
MCV: 79.1 fL (ref 78.0–100.0)
Platelets: 362 10*3/uL (ref 150–400)
RBC: 4.7 MIL/uL (ref 3.87–5.11)
RDW: 15 % (ref 11.5–15.5)
WBC: 8.9 10*3/uL (ref 4.0–10.5)

## 2010-06-14 ENCOUNTER — Ambulatory Visit: Payer: MEDICARE | Admitting: Family Medicine

## 2010-06-21 ENCOUNTER — Ambulatory Visit (INDEPENDENT_AMBULATORY_CARE_PROVIDER_SITE_OTHER): Payer: MEDICARE | Admitting: Family Medicine

## 2010-06-21 ENCOUNTER — Encounter: Payer: Self-pay | Admitting: Family Medicine

## 2010-06-21 VITALS — BP 131/78 | HR 70 | Temp 98.0°F | Ht 60.5 in | Wt 201.7 lb

## 2010-06-21 DIAGNOSIS — N950 Postmenopausal bleeding: Secondary | ICD-10-CM | POA: Insufficient documentation

## 2010-06-21 NOTE — Progress Notes (Signed)
  Subjective:    Patient ID: Amber [name], female    DOB: Jul 22, 1940, 70 y.o.   MRN: [id number]  HPI  F/u vaginal bleeding. Hysterectomy ( unclear whether she had cervix removed, unclear re ovaries) Since she saw Dr Rivka Safer, she has had no more vaginal bleeding She has not yet picked up rx for vaginal cream  Review of Systems Pertinent review of systems: negative for fever or unusual weight change. No dysuria, no pelvic or abdominal pain.     Objective:   Physical Exam     GEN WD female NAD GU externally atrophic  Bimanual wexam mild diffuse ttp entire vahina, no cervix, no adnexa or adnexal masses noted  PROCEDURE. Patient given informed consent, signed copy in the chart. Placed in lithotomy position. Speculum used with colposcope to view entire vaginal vault,. Well healed hysterectomy cuff was seen and no abnormalities noted on gross inspection, none with application of acetic acid or green filter. Some slight speckling of bleeding anterior to the cuff that looks consistent with speculum trauma. The entire vault quite atrophic in appearance. Were there any complications?none COMMENTS: Patient was given post procedure instructions.      Assessment & Plan:  Vaginal bleeding, hysterectomy cuff intact with no signs of abnormality on vaginal vault exam with colposcope. I would agree with vaginal estrogen cream at least once a week. Should she have any further vaginal bleeding she will contact me. I think this was all from atrophic tissue, but if it recurs would re-examine with colposcope.

## 2010-06-22 NOTE — Cardiovascular Report (Signed)
[place]. [facility]  Patient:    Amber [name], Amber [name]                    MRN: [id number] Proc. Date: 07/31/99 Adm. Date:  [id number] Attending:  Willow Ora CC:         Asencion Partridge, M.D.             Darci Needle, M.D.                        Cardiac Catheterization  CINE NUMBER:  [id number]  INDICATIONS:  Ischemic quality chest pain with positive troponins but normal other cardiac markers and ECG.  The test is being done to exclude coronary artery disease.  PROCEDURE PERFORMED: 1. Left heart catheterization. 2. Selective coronary angiography. 3. Left ventriculography.  DESCRIPTION OF PROCEDURE:  After informed consent, a 6 French sheath was placed in the right femoral artery using a modified Seldinger technique.  A 6 French A2 multipurpose catheter was used for hemodynamic recordings, left ventriculography and selective right and left coronary angiography.  The patient tolerated the procedure without complications.  RESULTS:  I:  HEMODYNAMIC DATA:     a. The aortic pressure 166/83 mmHg.     b. Left ventricular pressure 169/23 mmHg.  II:  LEFT VENTRICULOGRAPHY:  The left ventricle reveals mild left ventricular cavity enlargement.  Overall normal systolic function.  EF greater than 50%. No mitral regurgitation.  III:  SELECTIVE CORONARY ANGIOGRAPHY:     a. Left main:  The left main coronary artery appears somewhat ectatic        and demonstrates normal contractility.     b. Left anterior descending coronary artery:  This is a large vessel        that wraps around the left ventricular apex and is entirely normal.     c. Circumflex artery:  The circumflex artery is a large vessel giving        origin to a trifurcating first obtuse marginal and several smaller        more distal obtuse marginal branches.  The circumflex system is normal.     d. Right coronary artery:  The right coronary artery is nondominant and is        free of any significant  obstruction.  CONCLUSIONS: 1. Normal coronary arteries. 2. Normal left ventricular function. 3. Elevated troponins secondary to a false-positive lab error.  RECOMMENDATIONS:  No further cardiac evaluation.  Consider GI origin for the patients presumed symptoms. DD:  07/31/99 TD:  08/01/99 Job: [id number]

## 2010-06-22 NOTE — Consult Note (Signed)
[place]. [facility]  Patient:    Amber [name], Amber [name]                    MRN: [id number] Proc. Date: 07/30/99 Adm. Date:  [id number] Attending:  Willow Ora CC:         Asencion Partridge, M.D., referring             Carey Bullocks, M.D., referring                          Consultation Report  REFERRING PHYSICIANS:  Asencion Partridge, M.D./Michael Berlin Hun, M.D.  CONCLUSIONS: 1. Substernal chest pressure awakening the patient from sleep with mildly    positive troponin I 0.3.  Rule out acute coronary syndrome.  Rule out    noncardiac chest pain.    a. The patient has a history of normal stress Cardiolite in April of 1997. 2. History of systemic arterial hypertension. 3. Temporal arteritis. 4. Diabetes mellitus diagnosed February of 2001. 5. History of depression. 6. History of anemia.  RECOMMENDATIONS: 1. Follow serial troponin I and CK-MB determinations.  If subsequent tests    are elevated, the patient will need coronary angiography.  If tests    normalized, then perhaps the one troponin I that was elevated was a    superius lab result and I would favor an adenosine Cardiolite. 2. Agree with IV heparin. 3. Aspirin one per day. 4. Continue other antihypertensive and diabetes mellitus medications with    the exception of glucophage. 5. Serial EKGs also to help exclude evidence of ischemic heart disease.  COMMENTS:  The patient is 70 years old and works in [employer] at [employer].  I saw the patient in 1994 for palpitations and dyspnea.  At that time an evaluation including a Holter, echo and stress Cardiolite were negative for evidence of structural/organic heart disease.  This morning the patient was awakened at around 3 a.m. with substernal pressure.  It felt as though her chest was caving in.  There was no radiation of the discomfort into the neck or arms.  No diaphoresis was noted.  The discomfort lasted at least an hour and perhaps longer.  The  discomfort has significantly improved now, although there is still some mild awareness of discomfort in the substernal region.  MEDICATIONS: 1. Glucophage 1000 mg b.i.d. 2. Lotensin 40 mg per day. 3. Prednisone 5 mg per day. 4. Premarin 0.9 mg per day. 5. Aspirin one per day.  HABITS:  She does not smoke.  She does not drink.  FAMILY HISTORY:  Father died of sudden heart attack at age 86.  Mother had diabetes, hypertension, and died of cancer at age 62.  She has three brothers, one of whom has hypertension.  She has seven sisters, three with hypertension and one with breast cancer at age 62.  She has one son with a "history of diabetes, hypertension and CAD, and had a heart attack at age 17."  Daughter had an MI possibly at age 73.  ALLERGIES:  PENICILLIN, SULFA, CODEINE.  PAST SURGICAL HISTORY:  Total abdominal hysterectomy and tonsillectomy.  PHYSICAL EXAMINATION:  GENERAL APPEARANCE:  The patient is in no distress.  VITAL SIGNS:  Her blood pressure is 160/90, heart rate 85, respiratory rate 20 and nonlabored, O2 saturations 97%.  SKIN:  Clear.  HEENT:  Unremarkable.  NECK:  No JVD, no bruits, no adenopathy.  Thyroid is not palpable.  LUNGS:  Clear.  CARDIOVASCULAR:  There is an S4, otherwise unremarkable.  No diastolic murmurs and no rub.  ABDOMEN:  Soft.  The liver and spleen are not palpable.  The bowel sounds are normal.  EXTREMITIES:  There is no edema.  The pulses were 2+ and symmetric in the upper and lower extremities.  NEUROLOGICAL:  Normal.  MUSCULOSKELETAL:  There are no obvious joint deformities.  There is normal range of motion in the knees and elbows.  Chest x-ray reveals mild cardiomegaly.  CK-MB normal at 65/0.3.  Troponin mildly elevated at 0.3.  BMET is normal.  EKG reveals right axis deviation, otherwise unremarkable DD:  07/30/99 TD:  07/30/99 Job: [id number]

## 2010-06-22 NOTE — Discharge Summary (Signed)
[place]. [facility]  Patient:    AKARI, DEFELICE                      MRN: [id number] Dictator:   Jacobo Forest, M.D. CC:         Carey Bullocks, M.D.             Darci Needle, M.D.                           Discharge Summary  DISCHARGE DIAGNOSES: 1. Noncardiac chest pain.    a. Cardiac catheterization negative, normal left ventricular function, no       significant vessel disease.    b. False positive troponin Is. 2. History of type 2 diabetes. 3. History of hypertension.  DISCHARGE MEDICATIONS:  Same as previous: 1. Claritin 10 mg q.d. 2. Glucophage 1000 mg b.i.d. 3. K-Dur 20 mEq q.d. 4. Lotensin 40 mg q.d. 5. Prednisone 2.5 mg q.d. 6. Premarin 0.9 mg q.d. 7. Tylenol 1000 mg b.i.d. 8. Aspirin 325 mg q.d.  PROCEDURES: 1. Cardiac catheterization by Dr. Mendel Ryder, showing no significant vessel    disease, normal LV function. 2. Lower extremity Dopplers, negative, 2-D echo pending.  Follow-up 2-D echo,    would expect normal LV function after normal cardiac    catheterization.  HISTORY OF PRESENT ILLNESS:  Please see dictation.  HOSPITAL COURSE:  Ms. Saal is a 70 year old black female with past medical history of type 2 diabetes and hypertension, who presented to the emergency room with acute onset of chest pain.  The patient was admitted for chest pain to rule out MI.  Troponin Is were positive 0.3, 0.11, 0.21.  CKs were negative.  The patient remained pain-free on heparin and nitroglycerin drip. Cardiology was consulted.  Dr. Katrinka Blazing performed a cardiac catheterization, revealing normal LV function, no significant vessel disease.  The patient apparently had false positive troponin Is.  No change in medications.  She was discharged home in good condition on August 01, 1999.  FOLLOW-UP:  To follow up with her primary M.D. at [facility].DD:  08/01/99 TD:  08/01/99 Job: [id number]

## 2010-06-28 ENCOUNTER — Other Ambulatory Visit: Payer: Self-pay | Admitting: Family Medicine

## 2010-06-28 NOTE — Telephone Encounter (Signed)
Refill request

## 2010-07-18 ENCOUNTER — Ambulatory Visit: Payer: Medicare Other

## 2010-07-20 ENCOUNTER — Encounter: Payer: Self-pay | Admitting: Family Medicine

## 2010-07-20 ENCOUNTER — Ambulatory Visit (INDEPENDENT_AMBULATORY_CARE_PROVIDER_SITE_OTHER): Payer: Medicare Other | Admitting: Family Medicine

## 2010-07-20 VITALS — BP 127/82 | HR 69 | Temp 97.8°F | Ht 60.0 in | Wt 200.0 lb

## 2010-07-20 DIAGNOSIS — M549 Dorsalgia, unspecified: Secondary | ICD-10-CM

## 2010-07-20 MED ORDER — TRAMADOL-ACETAMINOPHEN 37.5-325 MG PO TABS: 1.0000 | ORAL_TABLET | Freq: Every day | ORAL | Status: DC

## 2010-07-20 NOTE — Patient Instructions (Addendum)
For you back pain: 1. Do the stretching exercises at least once daily, but if you want to do them more, that's good.  2. Try a topical cream (like icy-hot, aspercream, salonpas, or arnica cream) and have someone help you rub it in to where the pain is. 3. You can use heat to the area as needed.  Try stretching after you use the heat, it may help loosen the muscle. 4. Take one tablet of the tramadol-acetaminophen (ULTRACET) before bedtime for one week (for pain).   Back Exercises   These exercises may help you when beginning to rehabilitate your injury. Your symptoms may resolve with or without further involvement from your physician, physical therapist or athletic trainer. While completing these exercises, remember:   Restoring tissue flexibility helps normal motion to return to the joints. This allows healthier, less painful movement and activity.  An effective stretch should be held for at least 30 seconds.  A stretch should never be painful. You should only feel a gentle lengthening or release in the stretched tissue.   STRETCH - Extension, Prone on Elbows   Lie on your stomach on the floor, a bed will be too soft. Place your palms about shoulder width apart and at the height of your head.  Place your elbows under your shoulders. If this is too painful, stack pillows under your chest.  Allow your body to relax so that your hips drop lower and make contact more completely with the floor.  Hold this position for ____3______ seconds.  Slowly return to lying flat on the floor. Repeat ___5_______ times. Complete this exercise ______2____ times per day.      RANGE OF MOTION - Extension, Prone Press Ups   Lie on your stomach on the floor, a bed will be too soft. Place your palms about shoulder width apart and at the height of your head.  Keeping your back as relaxed as possible, slowly straighten your elbows while keeping your hips on the floor. You may adjust the placement of your  hands to maximize your comfort. As you gain motion, your hands will come more underneath your shoulders.  Hold this position _____3_____ seconds.  Slowly return to lying flat on the floor. Repeat ___5_______ times. Complete this exercise ______2____ times per day.        RANGE OF MOTION- Quadruped, Neutral Spine   Assume a hands and knees position on a firm surface. Keep your hands under your shoulders and your knees under your hips. You may place padding under your knees for comfort.    Drop your head and point your tail bone toward the ground below you.  This will round out your low back like an angry cat. Hold this position for _____3_____ seconds.   Slowly lift your head and release your tail bone so that your back sags into a large arch, like an old horse.  Hold this position for ______3____ seconds.   Repeat this until you feel limber in your low back.  Now, find your "sweet spot." This will be the most comfortable position somewhere between the two previous positions. This is your neutral spine. Once you have found this position, tense your stomach muscles to support your low back.  Hold this position for ______3____ seconds. Repeat ____5______ times.  Complete this exercise ______2____ times per day.      STRETCH - Flexion, Single Knee to Chest   Lie on a firm bed or floor with both legs extended in front of you.  Keeping one leg in contact with the floor, bring your opposite knee to your chest. Hold your leg in place by either grabbing behind your thigh or at your knee.  Pull until you feel a gentle stretch in your low back. Hold ___3_______ seconds.  Slowly release your grasp and repeat the exercise with the opposite side. Repeat ____3______ times. Complete this exercise _______2___ times per day.

## 2010-07-20 NOTE — Progress Notes (Signed)
  Subjective:    Patient ID: Amber [name], female    DOB: 1940-08-12, 70 y.o.   MRN: [id number]  HPI 70 year old woman presents with one week of subscapular back pain and one week of on-and-off cough, productive of pale yellow/white sputum.  Denies fever/chills/faitgue/malaise/unintentional weight changes; has a history of night sweats- these persist unchagned.  Recent sick contact: grandson had a cough, suspected viral URI, last week.  Bilateral subscapular back pain is currently 8/10 on pain scale, lasts all day, for a few days at a time. She has had a few pain free days.  Dull aching pain.  Aggravated by supine position, improved with upright position, and slightly improved with ASA.  She has a history of chronic back pain, but this is a new pain.  Denies cp/heaviness/pressure, palpitations, diaphoresis.  Had some AM nausea a few days ago, but has resolved.     Review of Systems  Constitutional: Negative.  Negative for fever, chills, diaphoresis, activity change, appetite change, fatigue and unexpected weight change.  HENT: Positive for rhinorrhea and postnasal drip. Negative for sore throat and sinus pressure.        Clear nasal drainage  Eyes: Negative for visual disturbance.  Respiratory: Positive for cough. Negative for chest tightness and shortness of breath.        Cough productive of white/pale yellow sputum  Cardiovascular: Negative for chest pain, palpitations and leg swelling.  Gastrointestinal: Negative for nausea.  Musculoskeletal: Positive for myalgias and back pain.       Subscapular muscles feel very tight & are achey  Skin: Negative for rash.  Neurological: Negative for dizziness, weakness, light-headedness, numbness and headaches.  Hematological: Negative for adenopathy.       Objective:   Physical Exam  Constitutional: She is oriented to person, place, and time. She appears well-developed and well-nourished. No distress.  HENT:  Head: Normocephalic and atraumatic.   Right Ear: External ear normal.  Left Ear: External ear normal.  Nose: Nose normal.  Mouth/Throat: Oropharynx is clear and moist. No oropharyngeal exudate.  Eyes: Conjunctivae are normal. Pupils are equal, round, and reactive to light.  Neck: Neck supple.  Cardiovascular: Normal rate, regular rhythm, normal heart sounds and intact distal pulses.  Exam reveals no gallop and no friction rub.   No murmur heard. Pulmonary/Chest: Effort normal and breath sounds normal. She exhibits no tenderness.  Abdominal: Soft. Bowel sounds are normal. She exhibits no mass. There is no tenderness.  Musculoskeletal: Normal range of motion. She exhibits tenderness.       Right shoulder: She exhibits tenderness.       Arms: Lymphadenopathy:    She has no cervical adenopathy.  Neurological: She is alert and oriented to person, place, and time.  Skin: Skin is warm and dry. No rash noted. She is not diaphoretic.  Psychiatric: She has a normal mood and affect.          Assessment & Plan:

## 2010-07-20 NOTE — Assessment & Plan Note (Addendum)
1. New upper back, bilateral subscapular back pain: acute problem, suspect muscle strain/tension.  A. Prescribed tramadol-acetaminophen (ultracet) 37.5-325mg  per tablet: take one tablet once daily at bedtime X 1 week for back pain.  B. Pt provided with stretching exercises for upper back, with instructions and pictures (suggested performing these stretches after a warm shower- may loosen muscles).  C. Suggested that pt could try topical cream (e.g. Icyhot, aspercream, salonpas, or arnica)- massage this into sore muscles may decrease pain.  D. Instructed pt she may use intermittent heat to the area, as needed.  Plan agreed upon by patient and medical team

## 2010-07-20 NOTE — Progress Notes (Addendum)
  Subjective:    Patient ID: Amber [name], female    DOB: [birth year], 70 y.o.   MRN: [id number]  HPI One week follow up for removal of Unna boot for bilateral foot ulcers.  [facility].  She does not feel the bottom or her feet, they are painful especially at night.  She dangles her feet during the night to get relief.  She has known PVD and venous insuff.   Review of Systems  Constitutional: Negative for fever and chills.  Skin:       Ulcers worry her       Objective:   Physical Exam  Constitutional:       Elderly, debilitated AA female.  Ambulatory and using continuous O2.  Skin:       Bilateral ulcerations, linear and about 2-3 cm long and 1/2 cm wide.  Bother on the lateral aspect of her feet.  Concern for arterial ulcerations based on location and symmetry.        The above was entered in the wrong patient medical record. S Amber [name]    Assessment & Plan:

## 2010-08-28 ENCOUNTER — Encounter: Payer: Self-pay | Admitting: Family Medicine

## 2010-08-28 ENCOUNTER — Ambulatory Visit: Payer: Medicare Other

## 2010-08-28 ENCOUNTER — Ambulatory Visit (INDEPENDENT_AMBULATORY_CARE_PROVIDER_SITE_OTHER): Payer: Medicare Other | Admitting: Family Medicine

## 2010-08-28 VITALS — BP 153/83 | HR 65 | Temp 98.2°F | Ht 60.5 in | Wt 199.0 lb

## 2010-08-28 DIAGNOSIS — M79609 Pain in unspecified limb: Secondary | ICD-10-CM

## 2010-08-28 DIAGNOSIS — M79605 Pain in left leg: Secondary | ICD-10-CM | POA: Insufficient documentation

## 2010-08-28 DIAGNOSIS — E119 Type 2 diabetes mellitus without complications: Secondary | ICD-10-CM

## 2010-08-28 DIAGNOSIS — M79604 Pain in right leg: Secondary | ICD-10-CM | POA: Insufficient documentation

## 2010-08-28 LAB — COMPREHENSIVE METABOLIC PANEL
Albumin: 4.3 g/dL (ref 3.5–5.2)
Alkaline Phosphatase: 61 U/L (ref 39–117)
BUN: 16 mg/dL (ref 6–23)
Calcium: 9.8 mg/dL (ref 8.4–10.5)
Chloride: 103 mEq/L (ref 96–112)
Glucose, Bld: 113 mg/dL — ABNORMAL HIGH (ref 70–99)
Potassium: 3.8 mEq/L (ref 3.5–5.3)
Sodium: 142 mEq/L (ref 135–145)
Total Protein: 7.6 g/dL (ref 6.0–8.3)

## 2010-08-28 NOTE — Assessment & Plan Note (Signed)
Isolated hyperglycemia without any red flags. Will follow. If worsening will see back in clinic. Red flags reviewed.

## 2010-08-28 NOTE — Progress Notes (Signed)
Ms Hayter presents to the clinic today with two issues:  1) High Glucose. Notes a glucose of 180 last night. She was prompted to check her sugar when she felt nauseated and weak. She denies any chest pain, palpitation or SOB during this episode. It lasted a few minutes and she now feels better. She does not remember any thing that she may have eaten to increase her blood sugar.   2) Calf pain: Notes painful bilateral lower legs now for several weeks. They can occur at rest. She denies any claudication like pain. She denies any rash or leg swelling. No pain elsewhere. Is taking meds as listed above. No other medications. Takes simvastatin daily for around a year now. No numbness or tingling.   PMH reviewed.  ROS as above otherwise neg  Exam:  Vs BP 153/83  Pulse 65  Temp(Src) 98.2 F (36.8 C) (Oral)  Ht 5' 0.5" (1.537 m)  Wt 199 lb (90.266 kg)  BMI 38.22 kg/m2  Gen: Well NAD HEENT: EOMI, PERRL, MMM Lungs: CTABL Nl WOB Heart: RRR no MRG Abd: NABS, NT, ND Exts: Non edematous BL  LE. Normal appearing. Same sized calves BL. Mildly tender to squeeze BL. Normal pulses and sensation in feet.  Is able to ambulate in Exam room normally.

## 2010-08-28 NOTE — Patient Instructions (Signed)
Thank you for coming in today. 1) Your high sugar. Lets keep a log of you your sugar is and how you feel. If this keeps happening we need to change your medicines.  2) Your leg pain: I will start getting some labs today. I would like you to STOP your simvastatin. I will let you know if the labs are positive.  3) I would like you to be seen by your doctor in a few weeks.  Come back sooner if you have chest pains, heart racing or trouble breathing or one of your legs gets swollen and painful.

## 2010-08-28 NOTE — Assessment & Plan Note (Signed)
I am concerned that this etiology may be myositis due to statins. DVT unlikely as she does not have swelling and both legs are symptomatic.  She describes a cramping feeling however her history is not very consistent for this.  Plan: Will obtain BMP and Mag as well as CK. If CK elevated will continue to avoid statins and will work up thyroid.  For now will hold statin and f/u with pcp in 4 weeks or so.  Red flags reviewed.

## 2010-08-28 NOTE — Assessment & Plan Note (Signed)
One isolated episode of hyperglycemia is not concerning. Her symptoms are also not worrysem if they are that short lived and do not recur.  Plan to follow. If this continues or she has signs or symptoms consistent with cardiac etiology will continue work up. At this point I reassured Amber [name].  She will f/u with the PCP.

## 2010-08-29 ENCOUNTER — Encounter: Payer: Self-pay | Admitting: Family Medicine

## 2010-08-29 ENCOUNTER — Other Ambulatory Visit: Payer: Self-pay | Admitting: Family Medicine

## 2010-08-29 DIAGNOSIS — I10 Essential (primary) hypertension: Secondary | ICD-10-CM

## 2010-09-19 ENCOUNTER — Ambulatory Visit: Payer: Medicare Other | Admitting: Family Medicine

## 2010-09-24 ENCOUNTER — Encounter: Payer: Self-pay | Admitting: Family Medicine

## 2010-09-24 ENCOUNTER — Ambulatory Visit (INDEPENDENT_AMBULATORY_CARE_PROVIDER_SITE_OTHER): Payer: Medicare Other | Admitting: Family Medicine

## 2010-09-24 VITALS — BP 134/84 | HR 83 | Ht 60.0 in | Wt 199.0 lb

## 2010-09-24 DIAGNOSIS — E119 Type 2 diabetes mellitus without complications: Secondary | ICD-10-CM

## 2010-09-24 LAB — LIPID PANEL
Cholesterol: 218 mg/dL — ABNORMAL HIGH (ref 0–200)
Total CHOL/HDL Ratio: 5 Ratio
VLDL: 34 mg/dL (ref 0–40)

## 2010-09-24 LAB — POCT GLYCOSYLATED HEMOGLOBIN (HGB A1C): Hemoglobin A1C: 6.4

## 2010-09-24 NOTE — Patient Instructions (Signed)
Please try some Mucinex for your congestion. This is likely allergies or a simple viral infection. Drink plenty of water and hot tea is great.  I will call you when your LIPID PANEL comes back. Hold off on the Simvastatin for now.

## 2010-09-24 NOTE — Progress Notes (Signed)
  Subjective:    Patient ID: Amber [name], female    DOB: [birth year], 70 y.o.   MRN: [id number]  HPI 1. Muscle cramps  Patient has been having lower ext. Muscle cramps, CK level 209. Has been on Simvastatin 40mg  for 3 years, two months of muscle cramps. She stopped taking the statin and has complete resolution of symptoms. She has no fever, no new arthralgias, no muscle pains or cramps at this time.   Review of Systems  Constitutional: Negative for fever and fatigue.  Cardiovascular: Negative for chest pain and leg swelling.  Musculoskeletal: Positive for myalgias. Negative for back pain, joint swelling, arthralgias and gait problem.       Objective:   Physical Exam  Musculoskeletal:       Right lower leg: She exhibits no tenderness, no bony tenderness, no swelling, no edema, no deformity and no laceration.       Left lower leg: She exhibits no tenderness, no bony tenderness, no swelling, no edema, no deformity and no laceration.      Assessment & Plan:  1. Muscle cramps - will stop simvastatin - checking Lipid Panel today (fasting since yesterday) - may start pravastatin depending on her lipid panel results.

## 2010-09-25 ENCOUNTER — Telehealth: Payer: Self-pay | Admitting: Family Medicine

## 2010-09-25 ENCOUNTER — Encounter: Payer: Self-pay | Admitting: Family Medicine

## 2010-09-25 MED ORDER — PRAVASTATIN SODIUM 40 MG PO TABS: 40.0000 mg | ORAL_TABLET | Freq: Every evening | ORAL | Status: DC

## 2010-09-25 NOTE — Telephone Encounter (Signed)
Changed simvastatin to Pravachol. Patient had muscle cramps. Her cholesterol panel was elevated after stopping Zocor.

## 2010-10-23 ENCOUNTER — Other Ambulatory Visit: Payer: Self-pay | Admitting: Family Medicine

## 2010-10-23 NOTE — Telephone Encounter (Signed)
Refill request

## 2010-10-30 LAB — POCT I-STAT, CHEM 8
Creatinine, Ser: 1
Hemoglobin: 12.2
Sodium: 142
TCO2: 29

## 2010-10-30 LAB — URINALYSIS, ROUTINE W REFLEX MICROSCOPIC
Glucose, UA: NEGATIVE
Ketones, ur: NEGATIVE
Specific Gravity, Urine: 1.012
pH: 6

## 2010-10-30 LAB — CBC
HCT: 33.7 — ABNORMAL LOW
MCV: 76.2 — ABNORMAL LOW
Platelets: 337
RDW: 14.6
WBC: 6.9

## 2010-10-30 LAB — URINE MICROSCOPIC-ADD ON

## 2010-10-30 LAB — DIFFERENTIAL
Eosinophils Absolute: 0.3
Eosinophils Relative: 5
Lymphs Abs: 3.2

## 2010-10-30 LAB — URINE CULTURE

## 2010-11-09 LAB — POCT CARDIAC MARKERS

## 2010-11-27 ENCOUNTER — Ambulatory Visit: Payer: Medicare Other | Admitting: Family Medicine

## 2010-12-03 ENCOUNTER — Other Ambulatory Visit: Payer: Self-pay | Admitting: Family Medicine

## 2010-12-03 NOTE — Telephone Encounter (Signed)
Refill request

## 2010-12-13 ENCOUNTER — Other Ambulatory Visit: Payer: Self-pay | Admitting: Family Medicine

## 2010-12-13 MED ORDER — METOPROLOL TARTRATE 100 MG PO TABS: 100.0000 mg | ORAL_TABLET | Freq: Two times a day (BID) | ORAL | Status: DC

## 2010-12-13 MED ORDER — GLUCOSE BLOOD VI STRP: ORAL_STRIP | Status: DC

## 2010-12-13 NOTE — Telephone Encounter (Signed)
Sent in 1 month supply of both and informed patient that she will have to come in for further refills. She agreed and is fine with this

## 2010-12-13 NOTE — Telephone Encounter (Signed)
Pt is out of her test strips and also needs her metoprolol - pharmacy states they have sent request. [facility] [place]

## 2010-12-17 ENCOUNTER — Ambulatory Visit: Payer: Medicare Other

## 2010-12-21 ENCOUNTER — Ambulatory Visit (INDEPENDENT_AMBULATORY_CARE_PROVIDER_SITE_OTHER): Payer: Medicare Other | Admitting: *Deleted

## 2010-12-21 DIAGNOSIS — Z23 Encounter for immunization: Secondary | ICD-10-CM

## 2010-12-24 ENCOUNTER — Other Ambulatory Visit: Payer: Self-pay | Admitting: Family Medicine

## 2010-12-24 NOTE — Telephone Encounter (Signed)
Refill request

## 2011-01-02 ENCOUNTER — Encounter: Payer: Self-pay | Admitting: Home Health Services

## 2011-01-03 ENCOUNTER — Encounter: Payer: Self-pay | Admitting: Family Medicine

## 2011-01-03 ENCOUNTER — Ambulatory Visit (INDEPENDENT_AMBULATORY_CARE_PROVIDER_SITE_OTHER): Payer: Medicare Other | Admitting: Family Medicine

## 2011-01-03 VITALS — BP 149/72 | HR 57 | Temp 98.4°F | Ht 60.0 in | Wt 201.0 lb

## 2011-01-03 DIAGNOSIS — E119 Type 2 diabetes mellitus without complications: Secondary | ICD-10-CM

## 2011-01-03 DIAGNOSIS — J019 Acute sinusitis, unspecified: Secondary | ICD-10-CM

## 2011-01-03 MED ORDER — FEXOFENADINE-PSEUDOEPHED ER 60-120 MG PO TB12: 1.0000 | ORAL_TABLET | Freq: Two times a day (BID) | ORAL | Status: DC

## 2011-01-03 MED ORDER — BUDESONIDE 32 MCG/ACT NA SUSP: 2.0000 | Freq: Every day | NASAL | Status: DC

## 2011-01-03 MED ORDER — IBUPROFEN 800 MG PO TABS: 800.0000 mg | ORAL_TABLET | Freq: Three times a day (TID) | ORAL | Status: DC | PRN

## 2011-01-03 NOTE — Progress Notes (Signed)
  Subjective:     Amber [name] is a 70 y.o. female who presents for evaluation of sinus pain. Symptoms include: congestion, facial pain, headaches, nasal congestion, post nasal drip and tooth pain. Onset of symptoms was 3 days ago. Symptoms have been unchanged since that time. Past history is significant for no history of pneumonia or bronchitis. Patient is a non-smoker. No sick contacts. Feels worse in the morning.  Review of Systems No fevers. Positive chills, and sweats. No weight loss.  Objective:   Filed Vitals:   01/03/11 1057  BP: 149/72  Pulse: 57  Temp: 98.4 F (36.9 C)  TempSrc: Oral  Height: 5' (1.524 m)  Weight: 201 lb (91.173 kg)  Lungs:  Normal respiratory effort, chest expands symmetrically. Lungs are clear to auscultation, no crackles or wheezes. Heart - Regular rate and rhythm.  No murmurs, gallops or rubs.    Throat: normal mucosa, no exudate, uvula midline, no redness Neck:  No deformities, thyromegaly, masses, or tenderness noted.   Supple with full range of motion without pain. Sinus tenderness maxillary sinuses.  Assessment:    Acute viral sinusitis.    Plan:    Nasal saline sprays. Antihistamines per medication orders.

## 2011-01-03 NOTE — Patient Instructions (Signed)
It was great to see you today!  Schedule an appointment to see me if you do not improve in a few days.  . Sinusitis Sinuses are air pockets within the bones of your face. The growth of bacteria within a sinus leads to infection. The infection prevents the sinuses from draining. This infection is called sinusitis. SYMPTOMS   There will be different areas of pain depending on which sinuses have become infected.  The maxillary sinuses often produce pain beneath the eyes.     Frontal sinusitis may cause pain in the middle of the forehead and above the eyes.  Other problems (symptoms) include:  Toothaches.     Colored, pus-like (purulent) drainage from the nose.     Swelling, warmth, and tenderness over the sinus areas may be signs of infection.  TREATMENT   Sinusitis is most often determined by an exam.X-rays may be taken. If x-rays have been taken, make sure you obtain your results or find out how you are to obtain them. Your caregiver may give you medications (antibiotics). These are medications that will help kill the bacteria causing the infection. You may also be given a medication (decongestant) that helps to reduce sinus swelling.   HOME CARE INSTRUCTIONS    Only take over-the-counter or prescription medicines for pain, discomfort, or fever as directed by your caregiver.     Drink extra fluids. Fluids help thin the mucus so your sinuses can drain more easily.     Applying either moist heat or ice packs to the sinus areas may help relieve discomfort.     Use saline nasal sprays to help moisten your sinuses. The sprays can be found at your local drugstore.  SEEK IMMEDIATE MEDICAL CARE IF:  You have a fever.     You have increasing pain, severe headaches, or toothache.     You have nausea, vomiting, or drowsiness.     You develop unusual swelling around the face or trouble seeing.  MAKE SURE YOU:    Understand these instructions.     Will watch your condition.     Will  get help right away if you are not doing well or get worse.  Document Released: 01/21/2005 Document Revised: 10/03/2010 Document Reviewed: 08/20/2006 [facility] Patient Information 2012 [place], [place].

## 2011-01-09 ENCOUNTER — Ambulatory Visit: Payer: Medicare Other | Admitting: Family Medicine

## 2011-01-15 ENCOUNTER — Other Ambulatory Visit: Payer: Self-pay | Admitting: Family Medicine

## 2011-01-15 NOTE — Telephone Encounter (Signed)
Refill request

## 2011-01-15 NOTE — Telephone Encounter (Signed)
Pt called checking status of this refill, says she called it in last week, is completely out.

## 2011-01-24 ENCOUNTER — Other Ambulatory Visit: Payer: Self-pay | Admitting: Family Medicine

## 2011-01-24 NOTE — Telephone Encounter (Signed)
Refill request

## 2011-02-01 ENCOUNTER — Ambulatory Visit (INDEPENDENT_AMBULATORY_CARE_PROVIDER_SITE_OTHER): Payer: Medicare Other | Admitting: Family Medicine

## 2011-02-01 ENCOUNTER — Ambulatory Visit (INDEPENDENT_AMBULATORY_CARE_PROVIDER_SITE_OTHER): Payer: Medicare Other | Admitting: *Deleted

## 2011-02-01 ENCOUNTER — Encounter: Payer: Self-pay | Admitting: Family Medicine

## 2011-02-01 DIAGNOSIS — J329 Chronic sinusitis, unspecified: Secondary | ICD-10-CM | POA: Insufficient documentation

## 2011-02-01 DIAGNOSIS — I10 Essential (primary) hypertension: Secondary | ICD-10-CM

## 2011-02-01 MED ORDER — DOXYCYCLINE HYCLATE 100 MG PO TABS: 100.0000 mg | ORAL_TABLET | Freq: Two times a day (BID) | ORAL | Status: AC

## 2011-02-01 MED ORDER — MOMETASONE FUROATE 50 MCG/ACT NA SUSP: 2.0000 | Freq: Every day | NASAL | Status: DC

## 2011-02-01 NOTE — Progress Notes (Signed)
Patient in for BP check because she has been feeling dizzy for 3 days. Orthostatic BP checked. She feels face is swollen at cheekbone bilaterally. Does appear to be swollen. Also states she can't smell.  Appointment scheduled with Dr. Earnest Bailey now.

## 2011-02-01 NOTE — Patient Instructions (Signed)
See info on nasal saline rinse  Sinusitis Sinusitis an infection of the air pockets (sinuses) in your face. This can cause puffiness (swelling). It can also cause drainage from your sinuses.   HOME CARE    Only take medicine as told by your doctor.     Drink enough fluids to keep your pee (urine) clear or pale yellow.     Apply moist heat or ice packs for pain relief.     Use salt (saline) nose sprays. The spray will wet the thick fluid in the nose. This can help the sinuses drain.  GET HELP RIGHT AWAY IF:    You have a fever.     Your baby is older than 3 months with a rectal temperature of 102 F (38.9 C) or higher.     Your baby is 65 months old or younger with a rectal temperature of 100.4 F (38 C) or higher.     The pain gets worse.     You get a very bad headache.     You keep throwing up (vomiting).     Your face gets puffy.  MAKE SURE YOU:    Understand these instructions.     Will watch your condition.     Will get help right away if you are not doing well or get worse.  Document Released: 07/10/2007 Document Revised: 10/03/2010 Document Reviewed: 07/10/2007 [facility] Patient Information 2012 [place], [place].

## 2011-02-01 NOTE — Assessment & Plan Note (Signed)
symptoms likely represent left maxillary sinusitis.  Will rx doxycycline (amox allergy) and discussed nasal saline irrigation.  I also refilled her nasonex.  Advised increased fluid intake, given red flags for return.

## 2011-02-01 NOTE — Progress Notes (Signed)
  Subjective:    Patient ID: Amber [name], female    DOB: 07-20-1940, 70 y.o.   MRN: [id number]  HPI 3 days of nasal congestion and drainage.  Left cheek hurts and is swollen, unable to smell.  Some cough.  No fever.  Not currently taking nasal steroid spray. No improvement with Claritin D.  Feels some vertigo (room spinning), but not postural.  No syncope or presyncope.  No tooth or jaw pain.  Review of Systems GEN: Alert & Oriented, No acute distress CV:  Regular Rate & Rhythm, no murmur Respiratory:  Normal work of breathing, CTAB Abd:  + BS, soft, no tenderness to palpation Ext: no pre-tibial edema     Objective:   Physical Exam GEN: Alert & Oriented, No acute distress HEENT: [place]/AT. EOMI, PERRLA, no conjunctival injection or scleral icterus.  Bilateral tympanic membranes intact without erythema or effusion.  .Very boggy nasal turbinates.  Oropharynx is without erythema or exudates.  No anterior or posterior cervical lymphadenopathy.  Left maxillary sinus tender to palpation, right wnl Oropharynx is without erythema or exudates.  No anterior or posterior cervical lymphadenopathy. CV:  Regular Rate & Rhythm, no murmur Respiratory:  Normal work of breathing, CTAB Abd:  + BS, soft, no tenderness to palpation Ext: no pre-tibial edema Neuro: normal gait.  Non focal.      Assessment & Plan:

## 2011-02-23 ENCOUNTER — Other Ambulatory Visit: Payer: Self-pay | Admitting: Family Medicine

## 2011-02-24 NOTE — Telephone Encounter (Signed)
Refill request

## 2011-03-04 DIAGNOSIS — H2510 Age-related nuclear cataract, unspecified eye: Secondary | ICD-10-CM | POA: Diagnosis not present

## 2011-03-04 DIAGNOSIS — H10409 Unspecified chronic conjunctivitis, unspecified eye: Secondary | ICD-10-CM | POA: Diagnosis not present

## 2011-03-16 ENCOUNTER — Other Ambulatory Visit: Payer: Self-pay | Admitting: Family Medicine

## 2011-03-16 NOTE — Telephone Encounter (Signed)
Refill request

## 2011-04-05 ENCOUNTER — Emergency Department (HOSPITAL_COMMUNITY)
Admission: EM | Admit: 2011-04-05 | Discharge: 2011-04-05 | Disposition: A | Discharge status: Home / Self Care | Payer: Medicare Other | Attending: Emergency Medicine | Admitting: Emergency Medicine

## 2011-04-05 ENCOUNTER — Emergency Department (HOSPITAL_COMMUNITY): Payer: Medicare Other

## 2011-04-05 ENCOUNTER — Encounter (HOSPITAL_COMMUNITY): Payer: Self-pay | Admitting: *Deleted

## 2011-04-05 DIAGNOSIS — I10 Essential (primary) hypertension: Secondary | ICD-10-CM | POA: Diagnosis not present

## 2011-04-05 DIAGNOSIS — R10819 Abdominal tenderness, unspecified site: Secondary | ICD-10-CM | POA: Diagnosis not present

## 2011-04-05 DIAGNOSIS — R109 Unspecified abdominal pain: Secondary | ICD-10-CM | POA: Diagnosis not present

## 2011-04-05 DIAGNOSIS — E119 Type 2 diabetes mellitus without complications: Secondary | ICD-10-CM | POA: Insufficient documentation

## 2011-04-05 DIAGNOSIS — R197 Diarrhea, unspecified: Secondary | ICD-10-CM | POA: Diagnosis not present

## 2011-04-05 DIAGNOSIS — R110 Nausea: Secondary | ICD-10-CM | POA: Insufficient documentation

## 2011-04-05 HISTORY — DX: Essential (primary) hypertension: I10

## 2011-04-05 HISTORY — DX: Unspecified osteoarthritis, unspecified site: M19.90

## 2011-04-05 LAB — URINALYSIS, ROUTINE W REFLEX MICROSCOPIC
Glucose, UA: NEGATIVE mg/dL
Nitrite: NEGATIVE
Protein, ur: NEGATIVE mg/dL

## 2011-04-05 LAB — COMPREHENSIVE METABOLIC PANEL
Albumin: 3.6 g/dL (ref 3.5–5.2)
BUN: 17 mg/dL (ref 6–23)
Chloride: 104 mEq/L (ref 96–112)
Creatinine, Ser: 0.86 mg/dL (ref 0.50–1.10)
GFR calc Af Amer: 78 mL/min — ABNORMAL LOW (ref >90)
Glucose, Bld: 125 mg/dL — ABNORMAL HIGH (ref 70–99)
Total Bilirubin: 0.3 mg/dL (ref 0.3–1.2)
Total Protein: 7.6 g/dL (ref 6.0–8.3)

## 2011-04-05 LAB — DIFFERENTIAL
Basophils Relative: 0 % (ref 0–1)
Eosinophils Absolute: 0.2 10*3/uL (ref 0.0–0.7)
Lymphs Abs: 1.3 10*3/uL (ref 0.7–4.0)
Monocytes Absolute: 0.5 10*3/uL (ref 0.1–1.0)
Monocytes Relative: 5 % (ref 3–12)

## 2011-04-05 LAB — CBC
HCT: 36.3 % (ref 36.0–46.0)
Hemoglobin: 11.5 g/dL — ABNORMAL LOW (ref 12.0–15.0)
MCH: 24.8 pg — ABNORMAL LOW (ref 26.0–34.0)
MCHC: 31.7 g/dL (ref 30.0–36.0)

## 2011-04-05 LAB — LIPASE, BLOOD: Lipase: 51 U/L (ref 11–59)

## 2011-04-05 MED ORDER — FENTANYL CITRATE 0.05 MG/ML IJ SOLN
50.0000 ug | Freq: Once | INTRAMUSCULAR | Status: AC
  Administered 2011-04-05: 50 ug via INTRAVENOUS
  Filled 2011-04-05: qty 2

## 2011-04-05 MED ORDER — OXYCODONE-ACETAMINOPHEN 5-325 MG PO TABS: 1.0000 | ORAL_TABLET | ORAL | Status: AC | PRN

## 2011-04-05 MED ORDER — SODIUM CHLORIDE 0.9 % IV BOLUS (SEPSIS)
500.0000 mL | Freq: Once | INTRAVENOUS | Status: AC
  Administered 2011-04-05: 500 mL via INTRAVENOUS

## 2011-04-05 MED ORDER — IOHEXOL 300 MG/ML  SOLN
100.0000 mL | Freq: Once | INTRAMUSCULAR | Status: AC | PRN
  Administered 2011-04-05: 100 mL via INTRAVENOUS

## 2011-04-05 MED ORDER — SODIUM CHLORIDE 0.9 % IV SOLN: INTRAVENOUS | Status: DC

## 2011-04-05 MED ORDER — ONDANSETRON 8 MG PO TBDP: 8.0000 mg | ORAL_TABLET | Freq: Three times a day (TID) | ORAL | Status: AC | PRN

## 2011-04-05 MED ORDER — ONDANSETRON HCL 4 MG/2ML IJ SOLN
4.0000 mg | Freq: Once | INTRAMUSCULAR | Status: AC
  Administered 2011-04-05: 4 mg via INTRAVENOUS
  Filled 2011-04-05: qty 2

## 2011-04-05 NOTE — ED Provider Notes (Signed)
History     CSN: [id number]  Arrival date & time 04/05/11  1503   First MD Initiated Contact with Patient 04/05/11 1532      Chief Complaint  Patient presents with  . Abdominal Pain    (Consider location/radiation/quality/duration/timing/severity/associated sxs/prior treatment) Patient is a 71 y.o. female presenting with abdominal pain. The history is provided by the patient.  Abdominal Pain The primary symptoms of the illness include abdominal pain, nausea and diarrhea. The primary symptoms of the illness do not include shortness of breath or vomiting.  Symptoms associated with the illness do not include back pain.   patient states she developed abdominal pain around 10 AM today. She points to her periumbilical area. States it is constant. She states it feels like she is having a baby. She's had nausea some vomiting. She's had minimal diarrhea. She states she did not have a bowel movement today. No dysuria. No fevers. No flank pain. No lightheadedness or dizziness.   Past Medical History  Diagnosis Date  . Diabetes mellitus   . Hypertension   . Arthritis     Past Surgical History  Procedure Date  . Colon surgery     Family History  Problem Relation Age of Onset  . Pancreatic cancer Sister     3 sisters died of it  . Heart attack Father 107    died  . Diabetes Mother   . Hypertension Son   . Hypertension Daughter     History  Substance Use Topics  . Smoking status: Never Smoker   . Smokeless tobacco: Former Neurosurgeon    Quit date: 02/05/1983  . Alcohol Use: No    OB History    Grav Para Term Preterm Abortions TAB SAB Ect Mult Living                  Review of Systems  Constitutional: Negative for activity change and appetite change.  HENT: Negative for neck stiffness.   Eyes: Negative for pain.  Respiratory: Negative for chest tightness and shortness of breath.   Cardiovascular: Negative for chest pain and leg swelling.  Gastrointestinal: Positive for nausea,  abdominal pain and diarrhea. Negative for vomiting.  Genitourinary: Negative for flank pain.  Musculoskeletal: Negative for back pain.  Skin: Negative for rash.  Neurological: Negative for weakness, numbness and headaches.  Psychiatric/Behavioral: Negative for behavioral problems.    Allergies  Amoxicillin; Codeine; Codeine phosphate; Penicillins; Sulfamethoxazole; and Sulfonamide derivatives  Home Medications   Current Outpatient Rx  Name Route Sig Dispense Refill  . ASPIRIN EC 81 MG PO TBEC Oral Take 1 tablet (81 mg total) by mouth daily. 150 tablet 2  . BUDESONIDE 32 MCG/ACT NA SUSP Nasal Place 2 sprays into the nose daily. 1 Bottle 2  . CITALOPRAM HYDROBROMIDE 20 MG PO TABS Oral Take 20 mg by mouth daily. To help with hot flashes     . ESTROGENS, CONJUGATED 0.625 MG/GM VA CREA Vaginal Place 0.5 Applicatorfuls vaginally daily. 42.5 g 1  . FE FUMARATE-B12-VIT C-FA-IFC PO CAPS Oral Take 1 capsule by mouth every morning before breakfast.      . FEXOFENADINE-PSEUDOEPHED ER 60-120 MG PO TB12 Oral Take 1 tablet by mouth 2 (two) times daily. 60 tablet 2  . GLUCOSE BLOOD VI STRP  Use as instructed. Test blood sugar twice daily 100 each 0  . IBUPROFEN 400 MG PO TABS Oral Take 400 mg by mouth 3 (three) times daily as needed. For back pain, take this medication with food.     Marland Kitchen  LOSARTAN POTASSIUM-HCTZ 100-25 MG PO TABS  take 1 tablet by mouth once daily 30 tablet 1  . METFORMIN HCL 500 MG PO TABS  TAKE 1 TABLET BY MOUTH TWICE A DAY 180 tablet 3  . METOPROLOL TARTRATE 100 MG PO TABS  take 1 tablet by mouth twice a day 60 tablet 0  . MOMETASONE FUROATE 50 MCG/ACT NA SUSP Nasal Place 2 sprays into the nose daily. For allergic rhinitis 17 g 5  . NEXIUM 40 MG PO CPDR  take 1 capsule by mouth every morning 30 capsule 3  . PRAVASTATIN SODIUM 40 MG PO TABS Oral Take 1 tablet (40 mg total) by mouth every evening. 30 tablet 11  . TRAMADOL-ACETAMINOPHEN 37.5-325 MG PO TABS Oral Take 1 tablet by mouth at  bedtime. For back pain 30 tablet 1  . IBUPROFEN 800 MG PO TABS Oral Take 1 tablet (800 mg total) by mouth every 8 (eight) hours as needed for pain. 30 tablet 0  . ONDANSETRON 8 MG PO TBDP Oral Take 1 tablet (8 mg total) by mouth every 8 (eight) hours as needed for nausea. 20 tablet 0  . OXYCODONE-ACETAMINOPHEN 5-325 MG PO TABS Oral Take 1 tablet by mouth every 4 (four) hours as needed for pain. 15 tablet 0    BP 114/61  Pulse 87  Temp(Src) 101.5 F (38.6 C) (Oral)  Resp 26  SpO2 99%  Physical Exam  Nursing note and vitals reviewed. Constitutional: She is oriented to person, place, and time. She appears well-developed and well-nourished.  HENT:  Head: Normocephalic and atraumatic.  Eyes: EOM are normal. Pupils are equal, round, and reactive to light.  Neck: Normal range of motion. Neck supple.  Cardiovascular: Normal rate, regular rhythm and normal heart sounds.   No murmur heard. Pulmonary/Chest: Effort normal and breath sounds normal. No respiratory distress. She has no wheezes. She has no rales.  Abdominal: Soft. Bowel sounds are normal. She exhibits no distension. There is tenderness. There is no rebound and no guarding.       Lower abdominal tenderness without rebound or guarding. No hernias palpated.  Musculoskeletal: Normal range of motion.  Neurological: She is alert and oriented to person, place, and time. No cranial nerve deficit.  Skin: Skin is warm and dry.  Psychiatric: She has a normal mood and affect. Her speech is normal.    ED Course  Procedures (including critical care time)  Labs Reviewed  CBC - Abnormal; Notable for the following:    Hemoglobin 11.5 (*)    MCH 24.8 (*)    All other components within normal limits  DIFFERENTIAL - Abnormal; Notable for the following:    Neutrophils Relative 79 (*)    All other components within normal limits  COMPREHENSIVE METABOLIC PANEL - Abnormal; Notable for the following:    Glucose, Bld 125 (*)    GFR calc non Af  Amer 67 (*)    GFR calc Af Amer 78 (*)    All other components within normal limits  URINALYSIS, ROUTINE W REFLEX MICROSCOPIC - Abnormal; Notable for the following:    Leukocytes, UA MODERATE (*)    All other components within normal limits  LIPASE, BLOOD  URINE MICROSCOPIC-ADD ON   Ct Abdomen Pelvis W Contrast  04/05/2011  *RADIOLOGY REPORT*  Clinical Data: Abdominal pain  CT ABDOMEN AND PELVIS WITH CONTRAST  Technique:  Multidetector CT imaging of the abdomen and pelvis was performed following the standard protocol during bolus administration of intravenous contrast.  Contrast:  OMNIPAQUE IOHEXOL 300 MG/ML IJ SOLN  Comparison: 06/04/2003  Findings:  Dependent changes noted within the medial right lung base.  Several areas of low density are noted within the liver.  These measure up to 7 mm.  Too small to characterize but likely small cysts.  No biliary ductal dilatation.  The gallbladder is normal.  The pancreas appears normal.  The spleen is normal.  The adrenal glands are both normal.  Normal appearance of the right kidney.  The left kidney is also normal.  There are no enlarged upper abdominal lymph nodes.  No pelvic or inguinal adenopathy noted.  The urinary bladder is normal.  The stomach and the small bowel loops are normal.  A few scattered colonic diverticula noted.  No acute inflammation.  There is an umbilical hernia which contains fat only.  There is mild spondylosis within the lumbar spine.  IMPRESSION:  1.  No acute findings identified within the abdomen or pelvis.  Original Report Authenticated By: Rosealee Albee, M.D.     1. Abdominal pain       MDM  Patient has epigastric to lower abdominal pain. Lab work reassuring. CT was done and was negative. Patient tolerated orals here. He does continue to have some pain. She be discharged home with pain meds and nausea meds. She'll followup with her primary care as needed        Juliet Rude. Rubin Payor, MD 04/05/11 [id number]

## 2011-04-05 NOTE — ED Notes (Signed)
Warm blankets given.

## 2011-04-05 NOTE — ED Notes (Signed)
Pt ambulatory to BR

## 2011-04-05 NOTE — ED Notes (Signed)
Onset 1000 today with epigastric pain, nausea, diarrhea x2

## 2011-04-05 NOTE — Discharge Instructions (Signed)

## 2011-04-05 NOTE — ED Notes (Signed)
ZOX:[id number]<AV> Expected date:04/05/11<BR> Expected time:<BR> Means of arrival:<BR> Comments:<BR> EMS 41 GC - n/d

## 2011-04-17 ENCOUNTER — Ambulatory Visit (INDEPENDENT_AMBULATORY_CARE_PROVIDER_SITE_OTHER): Payer: Medicare Other | Admitting: Family Medicine

## 2011-04-17 ENCOUNTER — Encounter: Payer: Self-pay | Admitting: Family Medicine

## 2011-04-17 VITALS — BP 137/73 | HR 61 | Temp 97.9°F | Ht 60.75 in | Wt 200.5 lb

## 2011-04-17 DIAGNOSIS — E119 Type 2 diabetes mellitus without complications: Secondary | ICD-10-CM | POA: Diagnosis not present

## 2011-04-17 DIAGNOSIS — I10 Essential (primary) hypertension: Secondary | ICD-10-CM

## 2011-04-17 DIAGNOSIS — M549 Dorsalgia, unspecified: Secondary | ICD-10-CM | POA: Diagnosis not present

## 2011-04-17 LAB — POCT UA - MICROALBUMIN
Albumin/Creatinine Ratio, Urine, POC: <30
Creatinine, POC: 100 mg/dL

## 2011-04-17 LAB — POCT URINALYSIS DIPSTICK
Glucose, UA: NEGATIVE
Leukocytes, UA: NEGATIVE
Nitrite, UA: NEGATIVE
Protein, UA: NEGATIVE
Urobilinogen, UA: 0.2

## 2011-04-17 MED ORDER — MELOXICAM 15 MG PO TABS: 15.0000 mg | ORAL_TABLET | Freq: Every day | ORAL | Status: DC

## 2011-04-17 MED ORDER — METOPROLOL TARTRATE 100 MG PO TABS: 100.0000 mg | ORAL_TABLET | Freq: Two times a day (BID) | ORAL | Status: DC

## 2011-04-17 MED ORDER — ACETAMINOPHEN ER 650 MG PO TBCR: 650.0000 mg | EXTENDED_RELEASE_TABLET | Freq: Three times a day (TID) | ORAL | Status: DC

## 2011-04-17 MED ORDER — LOSARTAN POTASSIUM-HCTZ 100-25 MG PO TABS: 1.0000 | ORAL_TABLET | Freq: Every day | ORAL | Status: DC

## 2011-04-17 NOTE — Assessment & Plan Note (Signed)
BP has been well controlled. Slightly elevated today. Will monitor on follow up, no medication change.

## 2011-04-17 NOTE — Assessment & Plan Note (Signed)
Was taking 800 mg of motrin BID - with hx of peptic ulcer disease.  Will change her over to mobic and tylenol

## 2011-04-18 NOTE — Patient Instructions (Addendum)
It was great to see you today!  Schedule an appointment to see me as needed.  Your HgA1c was great today.

## 2011-04-18 NOTE — Progress Notes (Signed)
  Subjective:    Patient ID: Amber [name], female    DOB: Dec 15, 1940, 71 y.o.   MRN: [id number]  HPI 1. Epigastric abdominal pain (ER follow up) Patient went to the ER 2 weeks ago for epigastric abdominal pain. Her CT abd was normal. Her labs were wnl. She suffers from gerd and is taking nexium. She has chronic arthritis in her back and leg pain for which she takes 800 mg of ibuprofen at least BID. She has resolution of her pain since. She has no sour brash at night or nightime awakens. No dysphagia. No emesis, n/v/d. No ruq pain. No urinary/stool symptoms. No rash. No recent viral illness.   Review of Systems Pertinent items are noted in HPI. No fever, chills, night sweats, weight loss.     Objective:   Physical Exam Filed Vitals:   04/17/11 1525  BP: 137/73  Pulse: 61  Temp: 97.9 F (36.6 C)  TempSrc: Oral  Height: 5' 0.75" (1.543 m)  Weight: 200 lb 8 oz (90.946 kg)  Heart - Regular rate and rhythm.  No murmurs, gallops or rubs.    Lungs:  Normal respiratory effort, chest expands symmetrically. Lungs are clear to auscultation, no crackles or wheezes. General: AAF, Obese, nad. Abdomen: soft and non-tender without masses, organomegaly or hernias noted.  No guarding or rebound Extremities:  No cyanosis, edema, or deformity noted  Skin:  Intact without suspicious lesions or rashes    Assessment & Plan:

## 2011-06-05 ENCOUNTER — Encounter: Payer: Self-pay | Admitting: Family Medicine

## 2011-06-05 ENCOUNTER — Ambulatory Visit (INDEPENDENT_AMBULATORY_CARE_PROVIDER_SITE_OTHER): Payer: Medicare Other | Admitting: Family Medicine

## 2011-06-05 VITALS — BP 138/74 | HR 60 | Temp 98.7°F | Ht 60.75 in | Wt 199.9 lb

## 2011-06-05 DIAGNOSIS — M546 Pain in thoracic spine: Secondary | ICD-10-CM

## 2011-06-05 DIAGNOSIS — M549 Dorsalgia, unspecified: Secondary | ICD-10-CM | POA: Insufficient documentation

## 2011-06-05 DIAGNOSIS — L259 Unspecified contact dermatitis, unspecified cause: Secondary | ICD-10-CM

## 2011-06-05 DIAGNOSIS — Z23 Encounter for immunization: Secondary | ICD-10-CM | POA: Diagnosis not present

## 2011-06-05 MED ORDER — TRAMADOL HCL 50 MG PO TABS: 50.0000 mg | ORAL_TABLET | Freq: Three times a day (TID) | ORAL | Status: AC | PRN

## 2011-06-05 MED ORDER — TRIAMCINOLONE ACETONIDE 0.1 % EX CREA: TOPICAL_CREAM | Freq: Two times a day (BID) | CUTANEOUS | Status: DC

## 2011-06-05 NOTE — Assessment & Plan Note (Signed)
Unknown contact trigger. Small area amenable to cream.

## 2011-06-05 NOTE — Patient Instructions (Signed)
It was great to see you today!  Schedule an appointment to see me as needed.  I hope you feel better soon.

## 2011-06-05 NOTE — Progress Notes (Signed)
  Subjective:    Patient ID: Amber [name], female    DOB: 01-15-1941, 71 y.o.   MRN: [id number]  HPI 1. Upper back pain Location: T4 area Started 3 days ago when rolling over in bed. Acute pain Pain is a soreness. Alleviating: none - tried tylenol and mobic Aggravating - there all the time, palpation made worse. Associated factors: none No fever, chills, no fall. She has no history of malignancy or easily broken bones.   2. Chest rash Contact with new soap (dove body wash). Not sure what could have caused it. Location: chest Type: non-pruritic, nontender, associated factors: none. Alleviating: none Aggravating: none  Tobacco use: Patient is a non smoker.  Review of Systems Pertinent items are noted in HPI.    Objective:   Physical Exam Filed Vitals:   06/05/11 0942  BP: 138/74  Pulse: 60  Temp: 98.7 F (37.1 C)  TempSrc: Oral  Height: 5' 0.75" (1.543 m)  Weight: 199 lb 14.4 oz (90.674 kg)  General: AAF, obese, friendly, in no distress  Back - Normal skin, Spine with normal alignment and no deformity.  Tenderness to vertebral process on palpation of T4 area.  Paraspinous muscles are not tender and without spasm.   Range of motion is full at neck and lumbar sacral regions Extremities:   Non-tender, No cyanosis, edema, or deformity noted. Chest: discrete, papular erythematous rash on chest. Nontender. No swelling. No drainage.  Neurologic exam : Cn 2-7 intact Strength equal & normal in upper & lower extremities Balance normal Gait normal    Assessment & Plan:

## 2011-06-05 NOTE — Assessment & Plan Note (Signed)
Central spinal tenderness. No neurological deficits. Will treat with pain medication for the next three days. If the pain is not getting better patient is to return to clinic.

## 2011-06-13 ENCOUNTER — Telehealth: Payer: Self-pay | Admitting: Family Medicine

## 2011-06-13 NOTE — Telephone Encounter (Signed)
Spoke with patient and she states this AM her BS was 137 before breakfast. At 1:00 she  Was feeling weak and checked BS and reading was 86.  She ate lunch and then about 3:30 today BS was 98.   Explained that BS readings are good and in a normal range. She is on metformin . States she just feels tired.  Offered appointment tomorrow but she seems relieved to know that BS are in normal range . She will call if she feels she needs to come in. Dr. Rivka Safer notified.

## 2011-06-13 NOTE — Telephone Encounter (Signed)
Pt has been taking her BS and readings are "all over the place" Wants to speak with nurse

## 2011-07-24 ENCOUNTER — Telehealth: Payer: Self-pay | Admitting: *Deleted

## 2011-07-24 NOTE — Telephone Encounter (Signed)
Patient calls asking how a spider bite usually looks and what might indicate that she has been bitten by a spider.  She has an area on ankle that has 6 little dots that are smaller than a pinhead.  The whole area involved is less than 1/2 in  diameter. She denies any itching, swelling , redness soreness. She feels well.  Gave her red flags to watch for and advised would need to see doctor if developes.

## 2011-07-30 ENCOUNTER — Emergency Department (HOSPITAL_COMMUNITY)
Admission: EM | Admit: 2011-07-30 | Discharge: 2011-07-30 | Disposition: A | Discharge status: Home / Self Care | Payer: Medicare Other | Attending: Emergency Medicine | Admitting: Emergency Medicine

## 2011-07-30 ENCOUNTER — Encounter (HOSPITAL_COMMUNITY): Payer: Self-pay | Admitting: Emergency Medicine

## 2011-07-30 DIAGNOSIS — E119 Type 2 diabetes mellitus without complications: Secondary | ICD-10-CM | POA: Diagnosis not present

## 2011-07-30 DIAGNOSIS — Z800 Family history of malignant neoplasm of digestive organs: Secondary | ICD-10-CM | POA: Insufficient documentation

## 2011-07-30 DIAGNOSIS — Z7982 Long term (current) use of aspirin: Secondary | ICD-10-CM | POA: Insufficient documentation

## 2011-07-30 DIAGNOSIS — Z888 Allergy status to other drugs, medicaments and biological substances status: Secondary | ICD-10-CM | POA: Insufficient documentation

## 2011-07-30 DIAGNOSIS — Z880 Allergy status to penicillin: Secondary | ICD-10-CM | POA: Insufficient documentation

## 2011-07-30 DIAGNOSIS — Z833 Family history of diabetes mellitus: Secondary | ICD-10-CM | POA: Insufficient documentation

## 2011-07-30 DIAGNOSIS — R42 Dizziness and giddiness: Secondary | ICD-10-CM | POA: Insufficient documentation

## 2011-07-30 DIAGNOSIS — M1990 Unspecified osteoarthritis, unspecified site: Secondary | ICD-10-CM | POA: Insufficient documentation

## 2011-07-30 DIAGNOSIS — Z8249 Family history of ischemic heart disease and other diseases of the circulatory system: Secondary | ICD-10-CM | POA: Diagnosis not present

## 2011-07-30 DIAGNOSIS — R21 Rash and other nonspecific skin eruption: Secondary | ICD-10-CM | POA: Insufficient documentation

## 2011-07-30 DIAGNOSIS — Z885 Allergy status to narcotic agent status: Secondary | ICD-10-CM | POA: Diagnosis not present

## 2011-07-30 DIAGNOSIS — Z881 Allergy status to other antibiotic agents status: Secondary | ICD-10-CM | POA: Insufficient documentation

## 2011-07-30 DIAGNOSIS — Z8241 Family history of sudden cardiac death: Secondary | ICD-10-CM | POA: Insufficient documentation

## 2011-07-30 DIAGNOSIS — I10 Essential (primary) hypertension: Secondary | ICD-10-CM | POA: Diagnosis not present

## 2011-07-30 DIAGNOSIS — Z882 Allergy status to sulfonamides status: Secondary | ICD-10-CM | POA: Insufficient documentation

## 2011-07-30 HISTORY — DX: Dorsalgia, unspecified: M54.9

## 2011-07-30 LAB — URINALYSIS, ROUTINE W REFLEX MICROSCOPIC
Glucose, UA: NEGATIVE mg/dL
Ketones, ur: NEGATIVE mg/dL
Protein, ur: NEGATIVE mg/dL

## 2011-07-30 LAB — GLUCOSE, CAPILLARY: Glucose-Capillary: 112 mg/dL — ABNORMAL HIGH (ref 70–99)

## 2011-07-30 LAB — CBC
MCH: 24.7 pg — ABNORMAL LOW (ref 26.0–34.0)
MCHC: 31.5 g/dL (ref 30.0–36.0)
MCV: 78.2 fL (ref 78.0–100.0)
Platelets: 307 10*3/uL (ref 150–400)
RBC: 4.54 MIL/uL (ref 3.87–5.11)
RDW: 14.3 % (ref 11.5–15.5)

## 2011-07-30 LAB — POCT I-STAT, CHEM 8
BUN: 19 mg/dL (ref 6–23)
Calcium, Ion: 1.24 mmol/L (ref 1.12–1.32)
Hemoglobin: 12.6 g/dL (ref 12.0–15.0)
TCO2: 27 mmol/L (ref 0–100)

## 2011-07-30 LAB — URINE MICROSCOPIC-ADD ON

## 2011-07-30 NOTE — ED Provider Notes (Signed)
Amber [name] is a 71 y.o. female with lightheadedness for several days associated with body position, both standing and lying down. She's able to walk without dyspnea, or dizziness. She denies abdominal pain, headache, back pain. She's been eating well, and taking her medicines as normal. She denies urinary symptoms. Screening evaluation performed.  Patient is alert, cooperative. Heart, regular rhythm. No murmur. Lungs clear to ascultation. Romberg is negative. There is no ataxia.   Medical screening examination/treatment/procedure(s) were conducted as a shared visit with non-physician practitioner(s) and myself.  I personally evaluated the patient during the encounter  Flint Melter, MD 07/30/11 1726

## 2011-07-30 NOTE — ED Notes (Signed)
Pt has c/o "dizzy spells" when she bends over or stands too fast. States she has had same s/s before and they resolved. Pt also has c/o rash on left medial ankle consisting of 4 small raised red dots.

## 2011-07-30 NOTE — ED Notes (Signed)
Pt ambulated to bathroom without difficulties or c/o dizziness

## 2011-07-30 NOTE — Discharge Instructions (Signed)
Please follow-up with your doctor regarding today's ER visit and to discuss any further testing he may feel is necessary. You can try an over-the-counter cortisone cream on the rash; see your doctor if it does not improve.      Dizziness Dizziness is a common problem. It is a feeling of unsteadiness or lightheadedness. You may feel like you are about to faint. Dizziness can lead to injury if you stumble or fall. A person of any age group can suffer from dizziness, but dizziness is more common in older adults. CAUSES  Dizziness can be caused by many different things, including:  Middle ear problems.   Standing for too long.   Infections.   An allergic reaction.   Aging.   An emotional response to something, such as the sight of blood.   Side effects of medicines.   Fatigue.   Problems with circulation or blood pressure.   Excess use of alcohol, medicines, or illegal drug use.   Breathing too fast (hyperventilation).   An arrhythmia or problems with your heart rhythm.   Low red blood cell count (anemia).   Pregnancy.   Vomiting, diarrhea, fever, or other illnesses that cause dehydration.   Diseases or conditions such as Parkinson's disease, high blood pressure (hypertension), diabetes, and thyroid problems.   Exposure to extreme heat.  DIAGNOSIS  To find the cause of your dizziness, your caregiver may do a physical exam, lab tests, radiologic imaging scans, or an electrocardiography test (ECG).  TREATMENT  Treatment of dizziness depends on the cause of your symptoms and can vary greatly. HOME CARE INSTRUCTIONS   Drink enough fluids to keep your urine clear or pale yellow. This is especially important in very hot weather. In the elderly, it is also important in cold weather.   If your dizziness is caused by medicines, take them exactly as directed. When taking blood pressure medicines, it is especially important to get up slowly.   Rise slowly from chairs and steady  yourself until you feel okay.   In the morning, first sit up on the side of the bed. When this seems okay, stand slowly while holding onto something until you know your balance is fine.   If you need to stand in one place for a long time, be sure to move your legs often. Tighten and relax the muscles in your legs while standing.   If dizziness continues to be a problem, have someone stay with you for a day or two. Do this until you feel you are well enough to stay alone. Have the person call your caregiver if he or she notices changes in you that are concerning.   Do not drive or use heavy machinery if you feel dizzy.  SEEK IMMEDIATE MEDICAL CARE IF:   Your dizziness or lightheadedness gets worse.   You feel nauseous or vomit.   You develop problems with talking, walking, weakness, or using your arms, hands, or legs.   You are not thinking clearly or you have difficulty forming sentences. It may take a friend or family member to determine if your thinking is normal.   You develop chest pain, abdominal pain, shortness of breath, or sweating.   Your vision changes.   You notice any bleeding.   You have side effects from medicine that seems to be getting worse rather than better.  MAKE SURE YOU:   Understand these instructions.   Will watch your condition.   Will get help right away if you  are not doing well or get worse.  Document Released: 07/17/2000 Document Revised: 01/10/2011 Document Reviewed: 08/10/2010 [facility] Patient Information 2012 [place], [place].       Rash A rash is a change in the color or texture of your skin. There are many different types of rashes. You may have other problems that accompany your rash. CAUSES   Infections.   Allergic reactions. This can include allergies to pets or foods.   Certain medicines.   Exposure to certain chemicals, soaps, or cosmetics.   Heat.   Exposure to poisonous plants.   Tumors, both cancerous and noncancerous.    SYMPTOMS   Redness.   Scaly skin.   Itchy skin.   Dry or cracked skin.   Bumps.   Blisters.   Pain.  DIAGNOSIS  Your caregiver may do a physical exam to determine what type of rash you have. A skin sample (biopsy) may be taken and examined under a microscope. TREATMENT  Treatment depends on the type of rash you have. Your caregiver may prescribe certain medicines. For serious conditions, you may need to see a skin doctor (dermatologist). HOME CARE INSTRUCTIONS   Avoid the substance that caused your rash.   Do not scratch your rash. This can cause infection.   You may take cool baths to help stop itching.   Only take over-the-counter or prescription medicines as directed by your caregiver.   Keep all follow-up appointments as directed by your caregiver.  SEEK IMMEDIATE MEDICAL CARE IF:  You have increasing pain, swelling, or redness.   You have a fever.   You have new or severe symptoms.   You have body aches, diarrhea, or vomiting.   Your rash is not better after 3 days.  MAKE SURE YOU:  Understand these instructions.   Will watch your condition.   Will get help right away if you are not doing well or get worse.  Document Released: 01/11/2002 Document Revised: 01/10/2011 Document Reviewed: 11/05/2010 [facility] Patient Information 2012 [place], [place].

## 2011-07-30 NOTE — ED Notes (Signed)
Patient wanted me to check cbg it was 112 notified RN Brooke of blood sugar

## 2011-07-30 NOTE — ED Notes (Signed)
NAD noted at time of d/c home. Pt verbalized understanding of d/c inst. 

## 2011-07-30 NOTE — ED Provider Notes (Signed)
History     CSN: [id number]  Arrival date & time 07/30/11  0757   First MD Initiated Contact with Patient 07/30/11 [phone]      Chief Complaint  Patient presents with  . Dizziness    (Consider location/radiation/quality/duration/timing/severity/associated sxs/prior treatment) The history is provided by the patient.  Pt is a 71 y/o F with PMH DM, HTN on multiple medications who presents to the ED with complaints of both dizzy spells and rash. Dizzy spells for the last 4 days, described as lightheadedness. Occurs with bending over or standing too quickly, resolve spontaneously within a few minutes. There is no associated visual change, tinnitus, sensation of room spinning, headache. No CP, palpitations, SOB. Denies nausea, trouble ambulating, weakness or numbness to extremities. Has had similar symptoms in the past that typically resolve within a day or two. No prior evals for same. Worse with position changes, no alleviating factors Regarding rash- pt notes rash to left ankle for the last 1 week. Described as several red bumps, it is not painful or pruritic. There has been no spreading redness, wound drainage. The rash has not progressed since its onset. No known insect bite or environmental exposure. No treatment has been attempted.   Past Medical History  Diagnosis Date  . Diabetes mellitus   . Hypertension   . Arthritis   . Back pain     Past Surgical History  Procedure Date  . Colon surgery   . Abdominal hysterectomy     Family History  Problem Relation Age of Onset  . Pancreatic cancer Sister     3 sisters died of it  . Heart attack Father 43    died  . Diabetes Mother   . Hypertension Son   . Hypertension Daughter     History  Substance Use Topics  . Smoking status: Never Smoker   . Smokeless tobacco: Former Neurosurgeon    Quit date: 02/05/1983  . Alcohol Use: No     Review of Systems 10 systems reviewed and are negative for acute change except as noted in the  HPI.  Allergies  Amoxicillin; Codeine; Codeine phosphate; Penicillins; Sulfamethoxazole; and Sulfonamide derivatives  Home Medications   Current Outpatient Rx  Name Route Sig Dispense Refill  . ACETAMINOPHEN 650 MG PO TABS Oral Take 1 tablet by mouth every 8 (eight) hours as needed. For pain    . ASPIRIN EC 81 MG PO TBEC Oral Take 81 mg by mouth daily.    . BUDESONIDE 32 MCG/ACT NA SUSP Nasal Place 2 sprays into the nose daily.    Marland Kitchen CITALOPRAM HYDROBROMIDE 20 MG PO TABS Oral Take 20 mg by mouth daily. To help with hot flashes     . ESOMEPRAZOLE MAGNESIUM 40 MG PO CPDR Oral Take 40 mg by mouth daily before breakfast.    . FEXOFENADINE-PSEUDOEPHED ER 60-120 MG PO TB12 Oral Take 1 tablet by mouth 2 (two) times daily.    Marland Kitchen LOSARTAN POTASSIUM-HCTZ 100-25 MG PO TABS Oral Take 1 tablet by mouth daily.    . MELOXICAM 15 MG PO TABS Oral Take 15 mg by mouth daily.    Marland Kitchen METFORMIN HCL 500 MG PO TABS Oral Take 500 mg by mouth 2 (two) times daily with a meal.    . METOPROLOL TARTRATE 100 MG PO TABS Oral Take 100 mg by mouth 2 (two) times daily.    . MOMETASONE FUROATE 50 MCG/ACT NA SUSP Nasal Place 2 sprays into the nose daily.    . WOMENS  ONE DAILY PO TABS Oral Take 1 tablet by mouth daily.    Marland Kitchen OVER THE COUNTER MEDICATION Ophthalmic Apply 1 drop to eye as needed. As per md instructions    . PRAVASTATIN SODIUM 40 MG PO TABS Oral Take 40 mg by mouth every evening.    Marland Kitchen TRAMADOL HCL 50 MG PO TABS Oral Take 50 mg by mouth every 8 (eight) hours as needed. For pain    . TRIAMCINOLONE ACETONIDE 0.1 % EX CREA Topical Apply 1 application topically 2 (two) times daily.    Marland Kitchen GLUCOSE BLOOD VI STRP  Use as instructed. Test blood sugar twice daily 100 each 0    BP 103/65  Pulse 69  Temp 98.2 F (36.8 C) (Oral)  Resp 20  Ht 5\' 2"  (1.575 m)  Wt 199 lb (90.266 kg)  BMI 36.40 kg/m2  SpO2 98%  Physical Exam  Constitutional: She is oriented to person, place, and time. She appears well-developed and  well-nourished. No distress.       Vital signs are reviewed and are normal.   HENT:  Head: Normocephalic and atraumatic.  Right Ear: External ear normal.  Left Ear: External ear normal.       Bilateral TM normal. MMM  Eyes: Conjunctivae and EOM are normal. Pupils are equal, round, and reactive to light.       No nystagmus. Visual fields full.   Neck: Normal range of motion. Neck supple.  Cardiovascular: Normal rate, regular rhythm and normal heart sounds.        Bilateral radial and DP pulses are 2+   Pulmonary/Chest: Effort normal and breath sounds normal. No respiratory distress. She has no wheezes. She has no rales.  Abdominal: Soft. Bowel sounds are normal. She exhibits no distension. There is no tenderness.  Musculoskeletal: She exhibits no edema.       Calves supple and non-tender  Neurological: She is alert and oriented to person, place, and time. She has normal strength. No cranial nerve deficit (3-12 intact) or sensory deficit (intact to light touch in all extremities distally). She displays a negative Romberg sign. Coordination (F-N, H-S intact bilaterally) and gait normal. GCS eye subscore is 4. GCS verbal subscore is 5. GCS motor subscore is 6.  Skin: Skin is warm and dry. Rash noted. Rash is maculopapular.       Rash to left medial ankle, consisting of 5 maculopapular spots each approx 2mm in diameter. No pustule, no vesicles. Non-tender to palpation. No area of skin breakdown seen. No surrounding induration or excess warmth.  Psychiatric: She has a normal mood and affect.    ED Course  Procedures (including critical care time)  Labs Reviewed  CBC - Abnormal; Notable for the following:    Hemoglobin 11.2 (*)     HCT 35.5 (*)     MCH 24.7 (*)     All other components within normal limits  POCT I-STAT, CHEM 8 - Abnormal; Notable for the following:    Potassium 3.4 (*)     Glucose, Bld 137 (*)     All other components within normal limits  URINALYSIS, ROUTINE W REFLEX  MICROSCOPIC - Abnormal; Notable for the following:    Leukocytes, UA TRACE (*)     All other components within normal limits  GLUCOSE, CAPILLARY - Abnormal; Notable for the following:    Glucose-Capillary 112 (*)     All other components within normal limits  URINE MICROSCOPIC-ADD ON  URINE CULTURE   No results found.  Date: 07/30/2011  Rate: 64  Rhythm: sinus  QRS Axis: left  Intervals: normal  ST/T Wave abnormalities: nonspecific T wave changes  Conduction Disutrbances:none  Narrative Interpretation: Slightly flattened t-waves in leads V3, V4 compared to prior, non-specific  Old EKG Reviewed: changes noted    1. Positional lightheadedness   2. Rash       MDM  Postural lightheadedness without tachycardia, SOB in pt in multiple antihypertensives. EKG with nonspecific changes, non-ischemic. No orthostasis. Labs reviewed without leukocytosis. Stable anemia. Urine with leukocytes, non-specific for infection. Culture sent, will not tx in ED.  Rash is non-specific, isolated. Poss due to insect bites or irritant. Discussed OTC cream use, advised PCP f/u if no resolution.        Shaaron Adler, [place] 07/30/11 1611

## 2011-07-31 LAB — URINE CULTURE
Colony Count: NO GROWTH
Culture: NO GROWTH

## 2011-08-15 ENCOUNTER — Other Ambulatory Visit: Payer: Self-pay | Admitting: Family Medicine

## 2011-09-02 ENCOUNTER — Ambulatory Visit (INDEPENDENT_AMBULATORY_CARE_PROVIDER_SITE_OTHER): Payer: Medicare Other | Admitting: Family Medicine

## 2011-09-02 ENCOUNTER — Encounter: Payer: Self-pay | Admitting: Family Medicine

## 2011-09-02 VITALS — BP 136/76 | HR 59 | Temp 97.7°F | Ht 62.0 in | Wt 201.0 lb

## 2011-09-02 DIAGNOSIS — J019 Acute sinusitis, unspecified: Secondary | ICD-10-CM

## 2011-09-02 DIAGNOSIS — J329 Chronic sinusitis, unspecified: Secondary | ICD-10-CM | POA: Insufficient documentation

## 2011-09-02 MED ORDER — DOXYCYCLINE HYCLATE 100 MG PO TABS: 100.0000 mg | ORAL_TABLET | Freq: Two times a day (BID) | ORAL | Status: AC

## 2011-09-02 MED ORDER — DESLORATADINE 5 MG PO TBDP: 5.0000 mg | ORAL_TABLET | Freq: Every day | ORAL | Status: DC

## 2011-09-02 NOTE — Assessment & Plan Note (Signed)
Will tx with doxy (Amoxicillin allergy).  Change Allegra to Clarinex.  Continue Nasonex and may start Mucinex.  Follow up in 2 weeks if symptoms do not improve.  Encouraged plenty of rest and fluids.

## 2011-09-02 NOTE — Progress Notes (Signed)
  Subjective:     Amber [name] is a 71 y.o. female who presents for evaluation of sinus pain. Symptoms include: clear rhinorrhea, facial pain, headaches, itchy eyes, nasal congestion and and chills at night. Onset of symptoms was 1 week ago. Symptoms have been unchanged since that time. Past history is significant for no significant respiratory problems.  Patient also complains of a "popping" sensation in her ears.  She is eating and sleeping well.    The following portions of the patient's history were reviewed and updated as appropriate: allergies, current medications and problem list.  Review of Systems Pertinent items are noted in HPI.   Objective:    BP 136/76  Pulse 59  Temp 97.7 F (36.5 C) (Oral)  Ht 5\' 2"  (1.575 m)  Wt 201 lb (91.173 kg)  BMI 36.76 kg/m2 General appearance: alert, cooperative and no distress Head: Normocephalic, without obvious abnormality, atraumatic; tenderness on palpation of sinuses Eyes: conjunctivae/corneas clear. PERRL, EOM's intact. Fundi benign. Ears: normal TM's and external ear canals both ears Nose: Nares normal. Septum midline. Mucosa normal. No drainage or sinus tenderness. Throat: lips, mucosa, and tongue normal; teeth and gums normal Lungs: clear to auscultation bilaterally Heart: regular rate and rhythm, S1, S2 normal, no murmur, click, rub or gallop    Assessment:    Acute bacterial sinusitis.    Plan:     Nasal steroids per medication orders. Antihistamines per medication orders. Doxycycline per medication orders. Follow up in 2 weeks for CPE or as needed.

## 2011-09-02 NOTE — Patient Instructions (Addendum)
It was good to see you today, I am sorry you are not feeling well. It appears you have acute sinusitis. Please stop Allegra and start taking Clarinex at bedtime. Continue Nasonex.  You can purchase over the counter Mucinex for congestion as well. Please pick up Doxycycline (antibiotics) and take for 7 days. Please get plenty of rest and drink plenty of fluids. If you develop worsening symptoms, persistent fevers > 5 days, or worsening facial pain, please return to clinic. Schedule annual physical with me at your earliest convenience.  Sinusitis Sinuses are air pockets within the bones of your face. The growth of bacteria within a sinus leads to infection. The infection prevents the sinuses from draining. This infection is called sinusitis. SYMPTOMS  There will be different areas of pain depending on which sinuses have become infected.  The maxillary sinuses often produce pain beneath the eyes.   Frontal sinusitis may cause pain in the middle of the forehead and above the eyes.  Other problems (symptoms) include:  Toothaches.   Colored, pus-like (purulent) drainage from the nose.   Swelling, warmth, and tenderness over the sinus areas may be signs of infection.  TREATMENT  Sinusitis is most often determined by an exam.X-rays may be taken. If x-rays have been taken, make sure you obtain your results or find out how you are to obtain them. Your caregiver may give you medications (antibiotics). These are medications that will help kill the bacteria causing the infection. You may also be given a medication (decongestant) that helps to reduce sinus swelling.  HOME CARE INSTRUCTIONS   Only take over-the-counter or prescription medicines for pain, discomfort, or fever as directed by your caregiver.   Drink extra fluids. Fluids help thin the mucus so your sinuses can drain more easily.   Applying either moist heat or ice packs to the sinus areas may help relieve discomfort.   Use saline  nasal sprays to help moisten your sinuses. The sprays can be found at your local drugstore.  SEEK IMMEDIATE MEDICAL CARE IF:  You have a fever.   You have increasing pain, severe headaches, or toothache.   You have nausea, vomiting, or drowsiness.   You develop unusual swelling around the face or trouble seeing.  MAKE SURE YOU:   Understand these instructions.   Will watch your condition.   Will get help right away if you are not doing well or get worse.  Document Released: 01/21/2005 Document Revised: 01/10/2011 Document Reviewed: 08/20/2006 [facility] Patient Information 2012 [place], [place].

## 2011-09-12 ENCOUNTER — Encounter: Payer: Self-pay | Admitting: Family Medicine

## 2011-09-12 ENCOUNTER — Ambulatory Visit (INDEPENDENT_AMBULATORY_CARE_PROVIDER_SITE_OTHER): Payer: Medicare Other | Admitting: Family Medicine

## 2011-09-12 VITALS — BP 139/81 | HR 68 | Ht 62.0 in | Wt 203.0 lb

## 2011-09-12 DIAGNOSIS — E119 Type 2 diabetes mellitus without complications: Secondary | ICD-10-CM

## 2011-09-12 DIAGNOSIS — E669 Obesity, unspecified: Secondary | ICD-10-CM | POA: Diagnosis not present

## 2011-09-12 NOTE — Assessment & Plan Note (Signed)
Patient seems motivated to increase activity and adhere to low carb diet. Patient is also interested in meeting with Dr. Gerilyn Pilgrim. Information given.

## 2011-09-12 NOTE — Progress Notes (Signed)
  Subjective:     Amber [name] is a 71 y.o. female who presents for follow up of diabetes.  Current symptoms include: none. Patient denies foot ulcerations, hyperglycemia, hypoglycemia , nausea and paresthesia of the feet. Evaluation to date has been: hemoglobin A1C.   Patient check sugars twice a day. Home sugars: BGs consistently in an acceptable range.  Patient tolerates metformin, denies any side effects.  Patient says that she has been walking 2-3 times per day.  She drinks sugary sodas, but she has tried to decrease fried foods and desserts.  Patient says she a lot of [place], lunchmeat, whole-wheat bread.  Last dilated eye exam 2012.  Review of Systems Pertinent items are noted in HPI.    Objective:    BP 139/81  Pulse 68  Ht 5\' 2"  (1.575 m)  Wt 203 lb (92.08 kg)  BMI 37.13 kg/m2 General appearance: alert, cooperative and no distress Lungs: clear to auscultation bilaterally Heart: regular rate and rhythm, S1, S2 normal, no murmur, click, rub or gallop Extremities: extremities normal, atraumatic, no cyanosis or edema Skin: No open ulcers, normal pinprick sensation    Laboratory: No components found with this basename: A1C      Assessment:    Diabetes mellitus Type II, under excellent control.    Plan:    See problem list

## 2011-09-12 NOTE — Patient Instructions (Addendum)
It was great seeing you today. Your A1c is stable at 6.6.  Keep up the good work. Continue taking Metformin twice daily. Try to increase physical activity to 30 minutes EVERY DAY! Schedule annual physical and diabetes follow up with Dr. Tye Savoy in 3 months.  Basic Carbohydrate Counting Basic carbohydrate counting is a way to plan meals. It is done by counting the amount of carbohydrate in foods. Eating carbohydrates increases blood glucose (sugar) levels. People with diabetes use carbohydrate counting to help keep their blood glucose at a normal level.  Foods that have carbohydrates are starches (grains, beans, starchy vegetables) and sweets.  COUNTING CARBOHYDRATES IN FOODS The first step in counting carbohydrates is to learn how many carbohydrate servings you should have in every meal. A dietitian can plan this for you. After learning the amount of carbohydrates to include in your meal plan, you can start to choose the carbohydrate-containing foods you want to eat.  There are 2 ways to identify the amount of carbohydrates in the foods you eat.  Read the Nutrition Facts panel on food labels. All you need are 2 pieces of information from the Nutrition Facts panel to count carbohydrates this way:   Serving size.   Total carbohydrate (in grams).  Decide how many servings you will be eating. If it is 1 serving, you will be eating the amount of carbohydrate listed on the panel. If you will be eating 2 servings, you will be eating double the amount of carbohydrate listed on the panel.   Learn serving sizes. A serving size of most carbohydrate-containing foods is about 15 g. Listed below are serving sizes of common carbohydrate-containing foods:   1 slice bread.    cup unsweetened, dry cereal.    cup hot cereal.   ? cup rice.    cup mashed potatoes.   ? cup pasta.   1 cup fresh fruit.    cup canned fruit.   1 cup milk (whole, 2%, or skim).    cup starchy vegetables (peas,  corn, or potatoes).  Counting carbohydrates this way is similar to looking on the Nutrition Facts panel. Decide how many servings you will eat first. Multiply the number of servings you eat by 15 g. For example, if you have 2 cups of strawberries, you had 2 servings. That means you had 30 g of carbohydrate (2 servings x 15 g = 30 g). CALCULATING CARBOHYDRATES IN A MEAL Sample dinner  3 oz chicken breast.   ? cup brown rice.    cup corn.   1 cup fat-free milk.   1 cup strawberries with sugar-free whipped topping.  Carbohydrate calculation First, identify the foods that contain carbohydrate:  Rice.   Corn.   Milk.   Strawberries.  Calculate the number of servings eaten:  2 servings rice.   1 serving corn.   1 serving milk.   1 serving strawberries.  Multiply the number of servings by 15 g:  2 servings rice x 15 g = 30 g.   1 serving corn x 15 g = 15 g.   1 serving milk x 15 g = 15 g.   1 serving strawberries x 15 g = 15 g.  Add the amounts to find the total carbohydrates eaten: 30 g + 15 g + 15 g + 15 g = 75 g carbohydrate eaten at dinner. Document Released: 01/21/2005 Document Revised: 01/10/2011 Document Reviewed: 12/07/2010 [facility] Patient Information 2012 [place], [place].

## 2011-09-12 NOTE — Assessment & Plan Note (Signed)
A1c stable at 6.6.  Encouraged patient to continue to lose weight and increase physical activity. Continue metformin 500 twice a day. Patient interested in meeting with Dr. Gerilyn Pilgrim. Referral ordered. Followup in 3 months for physical and diabetes.

## 2011-10-17 ENCOUNTER — Other Ambulatory Visit: Payer: Self-pay | Admitting: Family Medicine

## 2011-11-05 ENCOUNTER — Telehealth: Payer: Self-pay | Admitting: Family Medicine

## 2011-11-05 NOTE — Telephone Encounter (Signed)
Patient has 2 knots in her leg that are very sore and she is concerned that they may be bloodclots.

## 2011-11-05 NOTE — Telephone Encounter (Signed)
Spoke with patient . She states  first noticed knots  2-3 days ago. Size of marble.  No bruising noted  Areas are sore . No redness or warmth noted.  Appointment scheduled tomorrow .

## 2011-11-06 ENCOUNTER — Ambulatory Visit: Payer: Medicare Other | Admitting: Family Medicine

## 2011-12-04 ENCOUNTER — Ambulatory Visit: Payer: Medicare Other | Admitting: Family Medicine

## 2011-12-11 ENCOUNTER — Encounter: Payer: Self-pay | Admitting: Family Medicine

## 2011-12-11 ENCOUNTER — Ambulatory Visit (INDEPENDENT_AMBULATORY_CARE_PROVIDER_SITE_OTHER): Payer: Medicare Other | Admitting: Family Medicine

## 2011-12-11 VITALS — BP 129/49 | HR 70 | Temp 97.7°F | Ht 62.0 in | Wt 205.9 lb

## 2011-12-11 DIAGNOSIS — R079 Chest pain, unspecified: Secondary | ICD-10-CM | POA: Diagnosis not present

## 2011-12-11 DIAGNOSIS — Z23 Encounter for immunization: Secondary | ICD-10-CM | POA: Diagnosis not present

## 2011-12-11 DIAGNOSIS — R0781 Pleurodynia: Secondary | ICD-10-CM

## 2011-12-11 MED ORDER — CYCLOBENZAPRINE HCL 5 MG PO TABS: 5.0000 mg | ORAL_TABLET | Freq: Three times a day (TID) | ORAL | Status: DC | PRN

## 2011-12-11 MED ORDER — IBUPROFEN 600 MG PO TABS: 600.0000 mg | ORAL_TABLET | Freq: Three times a day (TID) | ORAL | Status: DC | PRN

## 2011-12-11 NOTE — Patient Instructions (Addendum)
It was great to see you today, Amber [name]. Please change your bra to a sports bra WITHOUT wires.  It should feel comfortable.  Wear a sports bra x 10 days. For pain, start using Flexeril as needed at bedtime and Motrin every 6-8 hours as needed for pain.  Do this for 2 weeks. If pain does not improve in 2 weeks, please return to clinic. If symptoms worsen or if you develop numbness/tingling of extremities or you cannot walk or you are wetting yourself or having involuntary BM, please return to clinic or go to ER.  Muscle Strain A muscle strain (pulled muscle) happens when a muscle is over-stretched. Recovery usually takes 5 to 6 weeks.  HOME CARE   Put ice on the injured area.  Put ice in a plastic bag.  Place a towel between your skin and the bag.  Leave the ice on for 15 to 20 minutes at a time, every hour for the first 2 days.  Do not use the muscle for several days or until your doctor says you can. Do not use the muscle if you have pain.  Wrap the injured area with an elastic bandage for comfort. Do not put it on too tightly.  Only take medicine as told by your doctor.  Warm up before exercise. This helps prevent muscle strains. GET HELP RIGHT AWAY IF:  There is increased pain or puffiness (swelling) in the affected area. MAKE SURE YOU:   Understand these instructions.  Will watch your condition.  Will get help right away if you are not doing well or get worse. Document Released: 10/31/2007 Document Revised: 04/15/2011 Document Reviewed: 10/31/2007 [facility] Patient Information 2013 [place], [place].

## 2011-12-12 ENCOUNTER — Encounter: Payer: Self-pay | Admitting: Family Medicine

## 2011-12-12 DIAGNOSIS — R0781 Pleurodynia: Secondary | ICD-10-CM | POA: Insufficient documentation

## 2011-12-12 NOTE — Progress Notes (Signed)
  Subjective:    Patient ID: Amber [name], female    DOB: 21-Sep-1940, 71 y.o.   MRN: [id number]  HPI  Amber [name] is a pleasant female who presents to clinic with RT rib pain.  It started about 4-5 days ago.  She denies any trauma or injury, but she was moving/lifting bags over the weekend.  Pain starts at her RT side (thoracic spine) and radiates down to low back (RT side).  Pain is intermittent, only when she rotates her back from side to side or when she reaches down to pick up something. She specifically points to the area where her bra touches her skin on the back.  She denies any hx of kidney stones.  Denies any hematuria or dysuria.  She takes Tylenol with little relief.    Denies any numbness/tingling of extremities.  Denies any bowel or urinary incontinence.  No abnormal gait.  Review of Systems  Per HPI    Objective:   Physical Exam  Constitutional: No distress.  Cardiovascular: Normal rate, regular rhythm and normal heart sounds.   Pulmonary/Chest: Effort normal and breath sounds normal. She has no wheezes. She has no rales.  Musculoskeletal:       Arms:         Assessment & Plan:

## 2011-12-12 NOTE — Assessment & Plan Note (Addendum)
Rib pain likely MSK in etiology from wired bra vs. Strain.  No hx of kidney stones and no hx of dysuria or hematuria. - Will treat with Flexeril and Motrin x 14 days - If no improvement after 2 weeks, patient to return to clinic - Red flags reviewed, warning signs to return to clinic or ER

## 2012-01-14 ENCOUNTER — Other Ambulatory Visit: Payer: Self-pay | Admitting: Family Medicine

## 2012-02-12 ENCOUNTER — Ambulatory Visit (INDEPENDENT_AMBULATORY_CARE_PROVIDER_SITE_OTHER): Payer: Medicare Other | Admitting: Family Medicine

## 2012-02-12 ENCOUNTER — Encounter: Payer: Self-pay | Admitting: Family Medicine

## 2012-02-12 VITALS — BP 152/66 | HR 61 | Temp 98.8°F | Wt 204.0 lb

## 2012-02-12 DIAGNOSIS — E119 Type 2 diabetes mellitus without complications: Secondary | ICD-10-CM | POA: Diagnosis not present

## 2012-02-12 DIAGNOSIS — M549 Dorsalgia, unspecified: Secondary | ICD-10-CM

## 2012-02-12 DIAGNOSIS — M546 Pain in thoracic spine: Secondary | ICD-10-CM | POA: Diagnosis not present

## 2012-02-12 MED ORDER — MELOXICAM 15 MG PO TABS: 15.0000 mg | ORAL_TABLET | Freq: Every day | ORAL | Status: DC

## 2012-02-12 MED ORDER — CYCLOBENZAPRINE HCL 5 MG PO TABS: 5.0000 mg | ORAL_TABLET | Freq: Three times a day (TID) | ORAL | Status: DC | PRN

## 2012-02-12 NOTE — Assessment & Plan Note (Signed)
Patient has missed DM maintenance visits, but DM well controlled on Metformin 500 BID.  Hgb A1c today 6.6. - Will need 3 month check up - Will need to check CMET and CBC at next annual physical or DM follow up

## 2012-02-12 NOTE — Progress Notes (Signed)
  Subjective:    Patient ID: Amber [name], female    DOB: [birth year], 72 y.o.   MRN: [id number]  HPI  Patient here for same day appointment to discuss back pain that started last week.  Has been taking Tylenol as needed for pain with some relief, but pain comes back.  Located mid-thoracic area and does not radiate.  Pain is intermittent, pain is worse at night.  Described as throbbing.  Pain does not wake up her up at night.  Pain scale 8/10 at its worst, but she is not in any pain currently.  Does not call recall any injury or trauma to back, but does complain of pain when lifting trash.  Patient is concerned back pain is due to pneumonia.  Denies any associated fever, cough, or chills.  Denies any chest pain.  Denies any paresthesias in upper extremities.  Denies any urinary symptoms or bowel incontinence.  DM Type 2: Patient due for Hbg A1c today.  Last drawn 09/2011.  DM has been well controlled on Metformin 500 BID.  Patient does not check CBG at home.  Denies any nausea/vomiting, HA, abdominal pain, fatigue, or foot ulcerations.    Review of Systems  Per HPI    Objective:   Physical Exam  Constitutional: She appears well-nourished. No distress.  Neck: Normal range of motion. Neck supple.  Pulmonary/Chest: Effort normal and breath sounds normal.  Abdominal: Soft. She exhibits no distension.  Musculoskeletal:       Back: thoracic spine - no skin or tissue texture changes; tenderness on palpation of T5-T7 and paraspinal muscles; limited rotation secondary to pain, but full flexion and extension; normal strength in both UE and LE.  Sensation intact in B/L LE.  Skin:       Dry, scaly skin B/L LE with +1 pitting edema around ankles       Assessment & Plan:

## 2012-02-12 NOTE — Patient Instructions (Signed)
It was great to see you today, Amber [name]. Please pick up Flexeril and MOBIC at pharmacy and use as directed. Do the exercises I showed you today in clinic.  If no improvement in back pain, please return to clinic in 2 weeks. Schedule follow up appointment with me in 3 months!

## 2012-02-12 NOTE — Assessment & Plan Note (Signed)
Patient is back with acute on chronic thoracic back pain.  Likely from overuse injury.  No neuro deficits or red flags on history or exam. - Flexeril and Mobic sent to pharmacy for 2 week course - Red flags reviewed with patient and per handout - ROM exercises reviewed in clinic  - Due to age, patient may need PT referral if pain does not improve  - Follow up as needed

## 2012-04-03 DIAGNOSIS — H5200 Hypermetropia, unspecified eye: Secondary | ICD-10-CM | POA: Diagnosis not present

## 2012-04-03 DIAGNOSIS — H04129 Dry eye syndrome of unspecified lacrimal gland: Secondary | ICD-10-CM | POA: Diagnosis not present

## 2012-04-03 DIAGNOSIS — H2510 Age-related nuclear cataract, unspecified eye: Secondary | ICD-10-CM | POA: Diagnosis not present

## 2012-04-03 DIAGNOSIS — E119 Type 2 diabetes mellitus without complications: Secondary | ICD-10-CM | POA: Diagnosis not present

## 2012-04-07 ENCOUNTER — Other Ambulatory Visit: Payer: Self-pay | Admitting: Family Medicine

## 2012-04-17 ENCOUNTER — Other Ambulatory Visit: Payer: Self-pay | Admitting: Family Medicine

## 2012-04-29 ENCOUNTER — Ambulatory Visit: Payer: Medicare Other | Admitting: Family Medicine

## 2012-05-01 ENCOUNTER — Ambulatory Visit (INDEPENDENT_AMBULATORY_CARE_PROVIDER_SITE_OTHER): Payer: Medicare Other | Admitting: Family Medicine

## 2012-05-01 ENCOUNTER — Encounter: Payer: Self-pay | Admitting: Family Medicine

## 2012-05-01 VITALS — BP 136/83 | HR 56 | Temp 98.1°F | Ht 62.0 in | Wt 207.0 lb

## 2012-05-01 DIAGNOSIS — R5383 Other fatigue: Secondary | ICD-10-CM

## 2012-05-01 DIAGNOSIS — R5381 Other malaise: Secondary | ICD-10-CM

## 2012-05-01 DIAGNOSIS — R42 Dizziness and giddiness: Secondary | ICD-10-CM

## 2012-05-01 DIAGNOSIS — R002 Palpitations: Secondary | ICD-10-CM | POA: Insufficient documentation

## 2012-05-01 DIAGNOSIS — E669 Obesity, unspecified: Secondary | ICD-10-CM

## 2012-05-01 LAB — BASIC METABOLIC PANEL
Calcium: 9.6 mg/dL (ref 8.4–10.5)
Sodium: 141 mEq/L (ref 135–145)

## 2012-05-01 MED ORDER — LOSARTAN POTASSIUM-HCTZ 100-12.5 MG PO TABS: 1.0000 | ORAL_TABLET | Freq: Every day | ORAL | Status: DC

## 2012-05-01 MED ORDER — METOPROLOL TARTRATE 50 MG PO TABS: 50.0000 mg | ORAL_TABLET | Freq: Two times a day (BID) | ORAL | Status: DC

## 2012-05-01 NOTE — Patient Instructions (Addendum)
I will call or send letter with lab results.   Dizziness may be due to blood pressure medications. Two new prescriptions are at your pharmacy: 1. Metoprolol 50 mg one tablet twice per day 2. [place]-HCTZ 100-12.5 one tablet once per day  Schedule follow up appointment with me in 2 WEEKS or sooner as needed. If you develop worsening dizziness, falls, or loss of consciousness, please report to ER.

## 2012-05-01 NOTE — Assessment & Plan Note (Signed)
NO giddiness.  Dizziness may be due to low HR from overmedication for HTN.  Orthostatic VS normal.  Plan to decreases Metoprolol to 50 mg BID and HCTZ to 12.5 mg.  Recheck BP in 2 weeks.  Sent CBC, BMET, and TSH to rule out any electrolyte abnormalities or anemia.  Follow up in 2 weeks or sooner PRN.  Red flags reviewed.

## 2012-05-01 NOTE — Progress Notes (Signed)
  Subjective:    Patient ID: Barton Fanny, female    DOB: 29-May-1940, 72 y.o.   MRN: [id number]  HPI  Patient complains of dizziness. Symptoms started 2 weeks ago, come and go. She says it is associated with change of position. Laying down and sitting is fine, but she becomes dizzy with standing too quickly. She has to stand up slowly or wait a few minutes, then symptoms go away. Denies vertigo or ringing in ears.  Does have a hx of hypertension and DM well controlled. She takes Metoprolol BID and [place]-HCTZ 100 daily (for several years). Denies any signs of dehydrated, fever or infection. Denise any LOC or syncopal episodes.  Orthostatic vitals signs normal.  Review of Systems Per HPI    Objective:   Physical Exam  Constitutional: She appears well-nourished. No distress.  HENT:  Head: Normocephalic and atraumatic.  Cardiovascular: Normal heart sounds.  Bradycardia present.   Pulmonary/Chest: Effort normal and breath sounds normal. She has no wheezes. She has no rales.  Neurological: She is alert. She has normal strength. No cranial nerve deficit or sensory deficit. Coordination and gait normal.  Negative Rhomberg.     Assessment & Plan:

## 2012-05-02 LAB — CBC
HCT: 34.2 % — ABNORMAL LOW (ref 36.0–46.0)
Hemoglobin: 11.4 g/dL — ABNORMAL LOW (ref 12.0–15.0)
RDW: 15 % (ref 11.5–15.5)
WBC: 8.8 10*3/uL (ref 4.0–10.5)

## 2012-05-13 ENCOUNTER — Ambulatory Visit (INDEPENDENT_AMBULATORY_CARE_PROVIDER_SITE_OTHER): Payer: Medicare Other | Admitting: Family Medicine

## 2012-05-13 ENCOUNTER — Encounter: Payer: Self-pay | Admitting: Family Medicine

## 2012-05-13 VITALS — BP 162/62 | HR 68 | Temp 98.8°F | Ht 62.0 in | Wt 209.7 lb

## 2012-05-13 DIAGNOSIS — E119 Type 2 diabetes mellitus without complications: Secondary | ICD-10-CM | POA: Diagnosis not present

## 2012-05-13 DIAGNOSIS — I10 Essential (primary) hypertension: Secondary | ICD-10-CM

## 2012-05-13 LAB — POCT GLYCOSYLATED HEMOGLOBIN (HGB A1C): Hemoglobin A1C: 6.5

## 2012-05-13 NOTE — Assessment & Plan Note (Signed)
Patient here for BP recheck after I decreased medications about 2 weeks ago.  HR now 68 and BP 162/62.  Patient denies any symptoms of dizziness or lightheadedness.  Plan to continue current regimen.  I do not want to overmedicate and put patient at risk for symptomatic bradycardia or hypotension.  Encouraged weight loss and DASH diet.  Recheck BP in 4-6 weeks.  Red flags reviewed per AVS.

## 2012-05-13 NOTE — Progress Notes (Signed)
  Subjective:    Patient ID: Amber [name], female    DOB: 1940/11/19, 72 y.o.   MRN: [id number]  Diabetes    Patient here for follow up dizziness 2 weeks ago.  At that visit, HR was 52 but orthostatics were normal.  I decreased Metorprolol from 100 BID to 50 BID and decreased HCTZ to 12.5 mg daily.  Since then, patient denies any dizziness.  Overall, she says she feels better, but does complain of feeling tired all the time.  She is aware that she is anemic but does not want to take Iron due to constipation.  Last Hgb was 11.2 which is her baseline.  BP is elevated today 162/62 and HR 68.  She denies HA, nausea/vomiting, chest pain or abdominal pain.  Does endorse fatigue.  Denies any LOC or syncopal episodes.  Review of Systems Per HPI    Objective:   Physical Exam  Constitutional: She appears well-nourished. No distress.  HENT:  Head: Normocephalic.  Mouth/Throat: Oropharynx is clear and moist.  Cardiovascular: Normal rate and normal heart sounds.   No murmur heard. Pulmonary/Chest: Effort normal and breath sounds normal.  Neurological:  Grossly normal.          Assessment & Plan:

## 2012-05-13 NOTE — Assessment & Plan Note (Signed)
Hemoglobin A1c is 6.5.  Continue Metformin 500 BID.  Follow up in 3 months.

## 2012-05-13 NOTE — Patient Instructions (Addendum)
It was good to see you today, Amber [name]. Your hemoglobin A1c was 6.5 which is perfect.  Keep up the good work. Continue all your blood pressure medications. If you develop dizziness, or you fall or lose consciousness, please go to ER. Schedule follow up appointment with me in 4-6 weeks.

## 2012-05-21 ENCOUNTER — Other Ambulatory Visit: Payer: Self-pay | Admitting: Family Medicine

## 2012-06-06 ENCOUNTER — Encounter (HOSPITAL_COMMUNITY): Payer: Self-pay | Admitting: Emergency Medicine

## 2012-06-06 ENCOUNTER — Emergency Department (HOSPITAL_COMMUNITY)
Admission: EM | Admit: 2012-06-06 | Discharge: 2012-06-06 | Disposition: A | Discharge status: Home / Self Care | Payer: Medicare Other | Attending: Emergency Medicine | Admitting: Emergency Medicine

## 2012-06-06 ENCOUNTER — Emergency Department (HOSPITAL_COMMUNITY): Payer: Medicare Other

## 2012-06-06 ENCOUNTER — Emergency Department (INDEPENDENT_AMBULATORY_CARE_PROVIDER_SITE_OTHER)
Admission: EM | Admit: 2012-06-06 | Discharge: 2012-06-06 | Disposition: A | Discharge status: Nursing Home (Includes ICF) | Payer: Medicare Other | Source: Home / Self Care | Attending: Family Medicine | Admitting: Family Medicine

## 2012-06-06 DIAGNOSIS — I10 Essential (primary) hypertension: Secondary | ICD-10-CM | POA: Diagnosis not present

## 2012-06-06 DIAGNOSIS — R110 Nausea: Secondary | ICD-10-CM | POA: Insufficient documentation

## 2012-06-06 DIAGNOSIS — Z79899 Other long term (current) drug therapy: Secondary | ICD-10-CM | POA: Diagnosis not present

## 2012-06-06 DIAGNOSIS — R1031 Right lower quadrant pain: Secondary | ICD-10-CM | POA: Insufficient documentation

## 2012-06-06 DIAGNOSIS — E119 Type 2 diabetes mellitus without complications: Secondary | ICD-10-CM | POA: Insufficient documentation

## 2012-06-06 DIAGNOSIS — K7689 Other specified diseases of liver: Secondary | ICD-10-CM | POA: Diagnosis not present

## 2012-06-06 DIAGNOSIS — Z8739 Personal history of other diseases of the musculoskeletal system and connective tissue: Secondary | ICD-10-CM | POA: Diagnosis not present

## 2012-06-06 DIAGNOSIS — Z7982 Long term (current) use of aspirin: Secondary | ICD-10-CM | POA: Diagnosis not present

## 2012-06-06 DIAGNOSIS — I700 Atherosclerosis of aorta: Secondary | ICD-10-CM | POA: Diagnosis not present

## 2012-06-06 DIAGNOSIS — G8929 Other chronic pain: Secondary | ICD-10-CM | POA: Diagnosis not present

## 2012-06-06 DIAGNOSIS — R109 Unspecified abdominal pain: Secondary | ICD-10-CM

## 2012-06-06 LAB — COMPREHENSIVE METABOLIC PANEL
AST: 16 U/L (ref 0–37)
BUN: 12 mg/dL (ref 6–23)
CO2: 30 mEq/L (ref 19–32)
Calcium: 9.7 mg/dL (ref 8.4–10.5)
Creatinine, Ser: 0.89 mg/dL (ref 0.50–1.10)
GFR calc Af Amer: 74 mL/min — ABNORMAL LOW (ref >90)
GFR calc non Af Amer: 64 mL/min — ABNORMAL LOW (ref >90)
Glucose, Bld: 94 mg/dL (ref 70–99)

## 2012-06-06 LAB — CBC WITH DIFFERENTIAL/PLATELET
Basophils Absolute: 0 10*3/uL (ref 0.0–0.1)
Eosinophils Relative: 5 % (ref 0–5)
HCT: 33.9 % — ABNORMAL LOW (ref 36.0–46.0)
Lymphocytes Relative: 59 % — ABNORMAL HIGH (ref 12–46)
MCHC: 32.7 g/dL (ref 30.0–36.0)
MCV: 76.2 fL — ABNORMAL LOW (ref 78.0–100.0)
Monocytes Absolute: 0.7 10*3/uL (ref 0.1–1.0)
Monocytes Relative: 8 % (ref 3–12)
RDW: 14.6 % (ref 11.5–15.5)
WBC: 9 10*3/uL (ref 4.0–10.5)

## 2012-06-06 LAB — POCT URINALYSIS DIP (DEVICE)
Glucose, UA: NEGATIVE mg/dL
Ketones, ur: NEGATIVE mg/dL
Specific Gravity, Urine: <=1.005 (ref 1.005–1.030)
Urobilinogen, UA: 0.2 mg/dL (ref 0.0–1.0)

## 2012-06-06 LAB — URINALYSIS, ROUTINE W REFLEX MICROSCOPIC
Ketones, ur: NEGATIVE mg/dL
Leukocytes, UA: NEGATIVE
Nitrite: NEGATIVE
Protein, ur: NEGATIVE mg/dL
Urobilinogen, UA: 0.2 mg/dL (ref 0.0–1.0)

## 2012-06-06 LAB — LIPASE, BLOOD: Lipase: 37 U/L (ref 11–59)

## 2012-06-06 MED ORDER — ONDANSETRON HCL 4 MG/2ML IJ SOLN
4.0000 mg | Freq: Once | INTRAMUSCULAR | Status: AC
  Administered 2012-06-06: 4 mg via INTRAVENOUS
  Filled 2012-06-06: qty 2

## 2012-06-06 MED ORDER — IOHEXOL 300 MG/ML  SOLN
100.0000 mL | Freq: Once | INTRAMUSCULAR | Status: AC | PRN
  Administered 2012-06-06: 100 mL via INTRAVENOUS

## 2012-06-06 MED ORDER — MORPHINE SULFATE 4 MG/ML IJ SOLN
4.0000 mg | Freq: Once | INTRAMUSCULAR | Status: AC
  Administered 2012-06-06: 4 mg via INTRAVENOUS
  Filled 2012-06-06: qty 1

## 2012-06-06 MED ORDER — IOHEXOL 300 MG/ML  SOLN
50.0000 mL | Freq: Once | INTRAMUSCULAR | Status: AC | PRN
  Administered 2012-06-06: 50 mL via ORAL

## 2012-06-06 MED ORDER — HYDROCODONE-ACETAMINOPHEN 5-325 MG PO TABS: 2.0000 | ORAL_TABLET | ORAL | Status: DC | PRN

## 2012-06-06 NOTE — ED Provider Notes (Signed)
History     CSN: [id number]  Arrival date & time 06/06/12  1658   First MD Initiated Contact with Patient 06/06/12 1700      Chief Complaint  Patient presents with  . Abdominal Pain    (Consider location/radiation/quality/duration/timing/severity/associated sxs/prior treatment) HPI Comments: Pt state that the has had urinary frequency  Patient is a 72 y.o. female presenting with abdominal pain. The history is provided by the patient. No language interpreter was used.  Abdominal Pain Pain location:  RLQ Pain quality: aching   Pain radiates to:  Does not radiate Pain severity:  Moderate Onset quality:  Sudden Duration:  4 days Timing:  Constant Progression:  Worsening Chronicity:  New Context: not alcohol use, not laxative use, not recent travel and not trauma   Relieved by:  Nothing Worsened by:  Nothing tried Associated symptoms: nausea   Associated symptoms: no diarrhea, no fever and no vomiting     Past Medical History  Diagnosis Date  . Diabetes mellitus   . Hypertension   . Arthritis   . Back pain     Past Surgical History  Procedure Laterality Date  . Colon surgery    . Abdominal hysterectomy      Family History  Problem Relation Age of Onset  . Pancreatic cancer Sister     3 sisters died of it  . Heart attack Father 36    died  . Diabetes Mother   . Hypertension Son   . Hypertension Daughter     History  Substance Use Topics  . Smoking status: Never Smoker   . Smokeless tobacco: Former Neurosurgeon    Quit date: 02/05/1983  . Alcohol Use: No    OB History   Grav Para Term Preterm Abortions TAB SAB Ect Mult Living                  Review of Systems  Constitutional: Negative for fever.  Respiratory: Negative.   Cardiovascular: Negative.   Gastrointestinal: Positive for nausea and abdominal pain. Negative for vomiting and diarrhea.    Allergies  Amoxicillin; Codeine; Codeine phosphate; Penicillins; Sulfamethoxazole; and Sulfonamide  derivatives  Home Medications   Current Outpatient Rx  Name  Route  Sig  Dispense  Refill  . Acetaminophen (ACETAMINOPHEN 8 HOUR) 650 MG TABS   Oral   Take 1 tablet by mouth every 8 (eight) hours as needed. For pain         . aspirin EC 81 MG tablet   Oral   Take 81 mg by mouth daily.         . budesonide (RHINOCORT AQUA) 32 MCG/ACT nasal spray   Nasal   Place 2 sprays into the nose daily.         . citalopram (CELEXA) 20 MG tablet   Oral   Take 20 mg by mouth daily. To help with hot flashes          . cyclobenzaprine (FLEXERIL) 5 MG tablet   Oral   Take 1 tablet (5 mg total) by mouth 3 (three) times daily as needed for muscle spasms.   30 tablet   1   . desloratadine (CLARINEX REDITAB) 5 MG disintegrating tablet   Oral   Take 1 tablet (5 mg total) by mouth at bedtime.   30 tablet   5   . glucose blood (ACCU-CHEK INSTANT GLUCOSE TEST) test strip      Use as instructed. Test blood sugar twice daily   100 each  0   . ibuprofen (ADVIL,MOTRIN) 600 MG tablet   Oral   Take 1 tablet (600 mg total) by mouth every 8 (eight) hours as needed for pain.   30 tablet   0   . losartan-hydrochlorothiazide (HYZAAR) 100-12.5 MG per tablet   Oral   Take 1 tablet by mouth daily.   90 tablet   3   . meloxicam (MOBIC) 15 MG tablet   Oral   Take 1 tablet (15 mg total) by mouth daily.   30 tablet   0   . metFORMIN (GLUCOPHAGE) 500 MG tablet   Oral   Take 500 mg by mouth 2 (two) times daily with a meal.         . metFORMIN (GLUCOPHAGE) 500 MG tablet      take 1 tablet by mouth twice a day   60 tablet   3   . metoprolol (LOPRESSOR) 50 MG tablet   Oral   Take 1 tablet (50 mg total) by mouth 2 (two) times daily.   180 tablet   3   . mometasone (NASONEX) 50 MCG/ACT nasal spray   Nasal   Place 2 sprays into the nose daily.         . Multiple Vitamins-Minerals (WOMENS ONE DAILY) TABS   Oral   Take 1 tablet by mouth daily.         Marland Kitchen OVER THE COUNTER  MEDICATION   Ophthalmic   Apply 1 drop to eye as needed. As per md instructions         . pravastatin (PRAVACHOL) 40 MG tablet      take 1 tablet by mouth every evening   30 tablet   11   . traMADol (ULTRAM) 50 MG tablet   Oral   Take 50 mg by mouth every 8 (eight) hours as needed. For pain         . triamcinolone cream (KENALOG) 0.1 %   Topical   Apply 1 application topically 2 (two) times daily.           BP 161/78  Pulse 60  Temp(Src) 97.4 F (36.3 C) (Oral)  Ht 5\' 2"  (1.575 m)  Wt 208 lb (94.348 kg)  BMI 38.03 kg/m2  Physical Exam  Nursing note and vitals reviewed. Constitutional: She is oriented to person, place, and time. She appears well-developed and well-nourished.  HENT:  Head: Normocephalic and atraumatic.  Eyes: Conjunctivae and EOM are normal. Pupils are equal, round, and reactive to light.  Neck: Normal range of motion. Neck supple.  Cardiovascular: Normal rate and regular rhythm.   Pulmonary/Chest: Effort normal and breath sounds normal.  Abdominal: Soft. Bowel sounds are normal. There is tenderness in the right lower quadrant.  Musculoskeletal: Normal range of motion.  Neurological: She is alert and oriented to person, place, and time.  Skin: Skin is warm and dry.  Psychiatric: She has a normal mood and affect.    ED Course  Procedures (including critical care time)  Labs Reviewed  CBC WITH DIFFERENTIAL - Abnormal; Notable for the following:    Hemoglobin 11.1 (*)    HCT 33.9 (*)    MCV 76.2 (*)    MCH 24.9 (*)    Neutrophils Relative 27 (*)    Lymphocytes Relative 59 (*)    Lymphs Abs 5.3 (*)    All other components within normal limits  COMPREHENSIVE METABOLIC PANEL - Abnormal; Notable for the following:    Total Bilirubin 0.2 (*)  GFR calc non Af Amer 64 (*)    GFR calc Af Amer 74 (*)    All other components within normal limits  LIPASE, BLOOD  URINALYSIS, ROUTINE W REFLEX MICROSCOPIC   Ct Abdomen Pelvis W  Contrast  06/06/2012  *RADIOLOGY REPORT*  Clinical Data: Right lower quadrant pain.  Prior hysterectomy. Diabetic hypertensive patient.  CT ABDOMEN AND PELVIS WITH CONTRAST  Technique:  Multidetector CT imaging of the abdomen and pelvis was performed following the standard protocol during bolus administration of intravenous contrast.  Contrast: OMNIPAQUE IOHEXOL 300 MG/ML  SOLN  Comparison: 04/05/2011.  Findings: Tiny amount of free fluid in the pelvis.  Source is not identified as no inflammation is noted surrounding portions of bowel.  Specifically, no inflammation surrounds the appendix.  Mild fatty infiltration of the liver.  Two sub centimeter lesions stable.  No calcified gallstone.  No focal splenic, adrenal, renal or pancreatic lesion.  Mild atherosclerotic type changes of the aorta branch vessels without aneurysmal dilation.  No bony destructive lesion.  Post hysterectomy.  Noncontrast filled views urinary bladder unremarkable.  No adenopathy.  Lung bases clear.  IMPRESSION: Tiny amount of free fluid the pelvis.  Source not identified as a discrete bowel inflammatory process is not identified. Specifically, no inflammation surrounds the appendix.  Please see above.   Original Report Authenticated By: Lacy Duverney, M.D.      1. Abdominal pain       MDM  No acute process note:pt to go home with something for pain        Teressa Lower, NP 06/06/12 1952

## 2012-06-06 NOTE — ED Notes (Addendum)
Pain in right side of abdomen, onset Tuesday.  Pain has been constant.  Denies urinary symptoms.  Last bm was this am and normal.  No diarrhea, no vomiting.  Does report minimal nausea.

## 2012-06-06 NOTE — ED Notes (Signed)
Pt presents to ED today from urgent care with RLQ pain for 4 days chills, decreased appetite, nausea but no vomiting or diarrhea. Pt reports discomfort with urination and frequency. NAD.

## 2012-06-06 NOTE — ED Notes (Signed)
NP at bedside.

## 2012-06-06 NOTE — ED Provider Notes (Signed)
History     CSN: [id number]  Arrival date & time 06/06/12  1343   First MD Initiated Contact with Patient 06/06/12 1534      Chief Complaint  Patient presents with  . Abdominal Pain    (Consider location/radiation/quality/duration/timing/severity/associated sxs/prior treatment) HPI Comments: 72 y/o female with h/o diabetes he andre c/o RLQ pain for 4 days. Symptoms associated with chills, decreased appetite and nausea. No vomitting or diarrhea. Also report discomfort on urination and urinary frequency. No hematuria. No melena. Las BM today normal. Denies dizziness or headache. Denies diaphoresis or shortness of breath. Denies history of kidney stones. Denies similar symptoms in the past. Patient report her blood sugar before breakfast it's been around 120s daily. Reports compliance with her chronic medications. Has not taken any medications for her current symptoms. Denies dizziness or headache.   Past Medical History  Diagnosis Date  . Diabetes mellitus   . Hypertension   . Arthritis   . Back pain     Past Surgical History  Procedure Laterality Date  . Colon surgery    . Abdominal hysterectomy      Family History  Problem Relation Age of Onset  . Pancreatic cancer Sister     3 sisters died of it  . Heart attack Father 70    died  . Diabetes Mother   . Hypertension Son   . Hypertension Daughter     History  Substance Use Topics  . Smoking status: Never Smoker   . Smokeless tobacco: Former Neurosurgeon    Quit date: 02/05/1983  . Alcohol Use: No    OB History   Grav Para Term Preterm Abortions TAB SAB Ect Mult Living                  Review of Systems  Constitutional: Positive for chills and appetite change. Negative for fever, diaphoresis, fatigue and unexpected weight change.  Respiratory: Negative for cough and shortness of breath.   Cardiovascular: Negative for chest pain and leg swelling.  Gastrointestinal: Positive for nausea and abdominal pain. Negative for  vomiting, diarrhea, constipation, blood in stool and abdominal distention.  Genitourinary: Positive for dysuria and frequency. Negative for urgency, hematuria, flank pain and vaginal bleeding.  Skin: Negative for rash.  Neurological: Negative for dizziness and headaches.  All other systems reviewed and are negative.    Allergies  Amoxicillin; Codeine; Codeine phosphate; Penicillins; Sulfamethoxazole; and Sulfonamide derivatives  Home Medications   Current Outpatient Rx  Name  Route  Sig  Dispense  Refill  . Acetaminophen (ACETAMINOPHEN 8 HOUR) 650 MG TABS   Oral   Take 1 tablet by mouth every 8 (eight) hours as needed. For pain         . aspirin EC 81 MG tablet   Oral   Take 81 mg by mouth daily.         . budesonide (RHINOCORT AQUA) 32 MCG/ACT nasal spray   Nasal   Place 2 sprays into the nose daily.         . citalopram (CELEXA) 20 MG tablet   Oral   Take 20 mg by mouth daily. To help with hot flashes          . cyclobenzaprine (FLEXERIL) 5 MG tablet   Oral   Take 1 tablet (5 mg total) by mouth 3 (three) times daily as needed for muscle spasms.   30 tablet   1   . desloratadine (CLARINEX REDITAB) 5 MG disintegrating tablet  Oral   Take 1 tablet (5 mg total) by mouth at bedtime.   30 tablet   5   . glucose blood (ACCU-CHEK INSTANT GLUCOSE TEST) test strip      Use as instructed. Test blood sugar twice daily   100 each   0   . ibuprofen (ADVIL,MOTRIN) 600 MG tablet   Oral   Take 1 tablet (600 mg total) by mouth every 8 (eight) hours as needed for pain.   30 tablet   0   . losartan-hydrochlorothiazide (HYZAAR) 100-12.5 MG per tablet   Oral   Take 1 tablet by mouth daily.   90 tablet   3   . meloxicam (MOBIC) 15 MG tablet   Oral   Take 1 tablet (15 mg total) by mouth daily.   30 tablet   0   . metFORMIN (GLUCOPHAGE) 500 MG tablet   Oral   Take 500 mg by mouth 2 (two) times daily with a meal.         . metFORMIN (GLUCOPHAGE) 500 MG  tablet      take 1 tablet by mouth twice a day   60 tablet   3   . metoprolol (LOPRESSOR) 50 MG tablet   Oral   Take 1 tablet (50 mg total) by mouth 2 (two) times daily.   180 tablet   3   . mometasone (NASONEX) 50 MCG/ACT nasal spray   Nasal   Place 2 sprays into the nose daily.         . Multiple Vitamins-Minerals (WOMENS ONE DAILY) TABS   Oral   Take 1 tablet by mouth daily.         Marland Kitchen OVER THE COUNTER MEDICATION   Ophthalmic   Apply 1 drop to eye as needed. As per md instructions         . pravastatin (PRAVACHOL) 40 MG tablet      take 1 tablet by mouth every evening   30 tablet   11   . traMADol (ULTRAM) 50 MG tablet   Oral   Take 50 mg by mouth every 8 (eight) hours as needed. For pain         . triamcinolone cream (KENALOG) 0.1 %   Topical   Apply 1 application topically 2 (two) times daily.           BP 167/78  Pulse 61  Temp(Src) 98.1 F (36.7 C) (Oral)  Resp 18  SpO2 96%  Physical Exam  Nursing note and vitals reviewed. Constitutional: She is oriented to person, place, and time. She appears well-developed and well-nourished. No distress.  HENT:  Head: Normocephalic and atraumatic.  Mouth/Throat: Oropharynx is clear and moist. No oropharyngeal exudate.  Eyes: Conjunctivae are normal. No scleral icterus.  Neck: No thyromegaly present.  Cardiovascular: Normal heart sounds.   Pulmonary/Chest: Breath sounds normal.  Abdominal: Soft. Bowel sounds are normal. She exhibits no distension.  Obese. No costovertebral tenderness. Significant tenderness to palpation in right lower quadrant with voluntary guarding. Patient jumping in grimacing with right lower quadrant palpation. No rebound tenderness. No masses.  Neurological: She is alert and oriented to person, place, and time.  Skin: No rash noted. She is not diaphoretic.    ED Course  Procedures (including critical care time)  Labs Reviewed  POCT URINALYSIS DIP (DEVICE) - Abnormal; Notable  for the following:    Leukocytes, UA TRACE (*)    All other components within normal limits   No results found.  1. Chronic RLQ pain, right       MDM   72 y/o female with h/o diabetes here c/o RLQ pain for 4 days. Symptoms associated with chills, decreased appetite and nausea. No vomitting or diarrhea. Also report discomfort on urination and urinary frequency. No hematuria. No melena. Las BM today normal. On exam: vitals signs stable. Afebrile. Abdomen: guarding and tender to palpation on RLQ No rebound. POC urinalysis with trace LE otherwise normal. Patient allergic to several abx. Decided to transfer to the emergency department for further evaluation and management.          Sharin Grave, MD 06/06/12 1650

## 2012-06-07 NOTE — ED Provider Notes (Signed)
Medical screening examination/treatment/procedure(s) were performed by non-physician practitioner and as supervising physician I was immediately available for consultation/collaboration.  Marizol Borror R. Jeaninne Lodico, MD 06/07/12 1103 

## 2012-06-08 ENCOUNTER — Telehealth: Payer: Self-pay | Admitting: Family Medicine

## 2012-06-08 NOTE — Telephone Encounter (Signed)
Patient would like to speak to the nurse about her feet being very swollen over the last week.  She is keeping them elevated, but that isn't helping.  She is scheduled to see Dr. Michelene Gardener on Friday.  She went to the ER about some really bad side pain, but she didn't mention her feet.

## 2012-06-08 NOTE — Telephone Encounter (Signed)
After speaking with patient she was instructed to schedule an earlier than this Friday appt with Dr Domenick Bookbinder.

## 2012-06-08 NOTE — Telephone Encounter (Signed)
Patient called back and was scheduled to come in tomorrow.

## 2012-06-09 ENCOUNTER — Encounter: Payer: Self-pay | Admitting: Family Medicine

## 2012-06-09 ENCOUNTER — Ambulatory Visit (INDEPENDENT_AMBULATORY_CARE_PROVIDER_SITE_OTHER): Payer: Medicare Other | Admitting: Family Medicine

## 2012-06-09 VITALS — BP 163/62 | HR 60 | Temp 98.1°F | Ht 62.0 in | Wt 212.0 lb

## 2012-06-09 DIAGNOSIS — R609 Edema, unspecified: Secondary | ICD-10-CM

## 2012-06-09 DIAGNOSIS — J309 Allergic rhinitis, unspecified: Secondary | ICD-10-CM

## 2012-06-09 MED ORDER — FUROSEMIDE 40 MG PO TABS: 40.0000 mg | ORAL_TABLET | Freq: Every day | ORAL | Status: DC

## 2012-06-09 MED ORDER — CETIRIZINE HCL 10 MG PO TABS: 10.0000 mg | ORAL_TABLET | Freq: Every day | ORAL | Status: DC

## 2012-06-09 MED ORDER — POTASSIUM CHLORIDE CRYS ER 10 MEQ PO TBCR: 10.0000 meq | EXTENDED_RELEASE_TABLET | Freq: Every day | ORAL | Status: DC

## 2012-06-09 NOTE — Progress Notes (Signed)
  Subjective:    Patient ID: Amber [name], female    DOB: 04/06/1940, 72 y.o.   MRN: [id number]  HPI  Sore throat:  Describes as hoarseness and itching of throat.  She says symptoms started last night and started coughing up white phlegm, rhinorrhea, nasal congestion this morning.  Denies any fevers at home, nausea/vomiting, change in appetite.  Does have a hx of seasonal allergies, she used to take Clarinex for allergies, but has not taken any yet this season and needs refills.  Never smoker.  Feet swelling: This started over one week ago, but this has been an ongoing, chronic issue.  She has tried elevating legs on two pillows.  She has compression hose at home but has not been wearing them.  Denies any chest pain or SOB.  She admits to SOB at times only with exertion.  No Echo has been done done yet.  Kidney function normal.  Review of Systems Per HPI    Objective:   Physical Exam  Constitutional: No distress.  HENT:  Head: Atraumatic.  Nose: Rhinorrhea present.  Mouth/Throat: Oropharynx is clear and moist. No oropharyngeal exudate.  MM dry  Neck: Normal range of motion.  Cardiovascular: Normal rate and normal heart sounds.   Pulmonary/Chest: Effort normal.  Musculoskeletal:  Trace pitting around ankles bilaterally; socks made an imprint; but no skin changes or weeping       Assessment & Plan:

## 2012-06-09 NOTE — Patient Instructions (Addendum)
Your blood pressure is high again today, but your heart rate is back to normal. Start taking Lasix 40 mg one tablet once a day. Take your Potassium tablet once a day also. Remember to wear compression stockings during the day and elevate your feet at night. For seasonal allergies, take Zyrtec one tablet daily until June. Schedule follow up appointment with me in 4 weeks. If feet swelling worsens or you develop shortness of breath or chest pain, please return to clinic sooner.

## 2012-06-10 ENCOUNTER — Encounter: Payer: Self-pay | Admitting: Family Medicine

## 2012-06-10 NOTE — Assessment & Plan Note (Signed)
Trace edema of bilateral ankles today.  BP still elevated today, but she has had symptoms of dizziness and borderline bradycardia in the past.  Will hold off on increasing current BP medications.  Will add Lasix 40 daily x one month and re-assess in 4 weeks.  Add K supplement.  Also encouraged compression hose and leg elevation at bedtime.

## 2012-06-10 NOTE — Assessment & Plan Note (Signed)
Patient has run out of old anti-histamine. Will send new Rx for Zyrtec daily until summertime.  Continue Nasonex PRN for sinusitis and eye drops.  Follow up as needed if symptoms do not improve on this regimen.

## 2012-06-12 ENCOUNTER — Ambulatory Visit: Payer: Medicare Other | Admitting: Family Medicine

## 2012-06-16 ENCOUNTER — Encounter: Payer: Self-pay | Admitting: Family Medicine

## 2012-07-08 ENCOUNTER — Ambulatory Visit: Payer: Medicare Other | Admitting: Family Medicine

## 2012-07-13 ENCOUNTER — Other Ambulatory Visit: Payer: Self-pay | Admitting: *Deleted

## 2012-07-13 MED ORDER — FUROSEMIDE 40 MG PO TABS: 40.0000 mg | ORAL_TABLET | Freq: Every day | ORAL | Status: DC

## 2012-07-13 NOTE — Telephone Encounter (Signed)
Requested Prescriptions   Pending Prescriptions Disp Refills  . furosemide (LASIX) 40 MG tablet 30 tablet 0    Sig: Take 1 tablet (40 mg total) by mouth daily.   Wyatt Haste, RN-BSN

## 2012-07-16 ENCOUNTER — Encounter: Payer: Self-pay | Admitting: Family Medicine

## 2012-07-16 ENCOUNTER — Ambulatory Visit (INDEPENDENT_AMBULATORY_CARE_PROVIDER_SITE_OTHER): Payer: Medicare Other | Admitting: Family Medicine

## 2012-07-16 VITALS — BP 138/56 | HR 61 | Temp 98.9°F | Ht 62.0 in | Wt 205.0 lb

## 2012-07-16 DIAGNOSIS — E119 Type 2 diabetes mellitus without complications: Secondary | ICD-10-CM

## 2012-07-16 MED ORDER — POTASSIUM CHLORIDE CRYS ER 10 MEQ PO TBCR: 10.0000 meq | EXTENDED_RELEASE_TABLET | Freq: Every day | ORAL | Status: DC

## 2012-07-16 NOTE — Patient Instructions (Addendum)
Your feet look much better today, Amber [name].  Keep up the good work. Your blood pressure is also at goal.   Continue to avoid eating foods high salt. For leg swelling, you can take Lasix one tablet only as needed!  You do not need to take it everyday. Continue to elevate both legs over heart at bedtime and wear support hose.  Schedule follow up diabetes appointment in ONE month.  It was my pleasure taking care of you, Amber [name].  Thank you.

## 2012-07-16 NOTE — Progress Notes (Signed)
  Subjective:    Patient ID: Amber [name], female    DOB: Sep 17, 1940, 73 y.o.   MRN: [id number]  HPI  Feet swelling:  This has been an ongoing, chronic issue that worsened in the last 2 months.  She has been good about elevating legs at night, wearing support hose.  She also has took Lasix 40 mg and K supplement daily x 4 weeks.  She says swelling has improved and she feels so much better.  Denies any chest pain or SOB.   Hypertension: BP at goal today, much improved from last visit.  Patient says she has lost weight, some of it fluid from Lasix, but she has also been eating health, low sodium foods.  She is compliant with BP medications.  Denies any HA, CP, nausea/vomiting, abdominal pain, SOB or changes in vision.  Review of Systems Per HPI    Objective:   Physical Exam  Constitutional: She appears well-nourished. No distress.  Cardiovascular: Normal rate.   Murmur heard. Pulmonary/Chest: Effort normal and breath sounds normal.  Abdominal: Soft.  Musculoskeletal: Normal range of motion. She exhibits no edema.  Skin: No erythema.     Assessment & Plan:

## 2012-08-13 ENCOUNTER — Other Ambulatory Visit: Payer: Self-pay

## 2012-08-31 DIAGNOSIS — H01009 Unspecified blepharitis unspecified eye, unspecified eyelid: Secondary | ICD-10-CM | POA: Diagnosis not present

## 2012-08-31 DIAGNOSIS — H1089 Other conjunctivitis: Secondary | ICD-10-CM | POA: Diagnosis not present

## 2012-09-23 ENCOUNTER — Ambulatory Visit: Payer: Medicare Other | Admitting: Sports Medicine

## 2012-09-30 ENCOUNTER — Encounter: Payer: Self-pay | Admitting: Sports Medicine

## 2012-09-30 ENCOUNTER — Other Ambulatory Visit: Payer: Self-pay | Admitting: Sports Medicine

## 2012-09-30 ENCOUNTER — Ambulatory Visit (INDEPENDENT_AMBULATORY_CARE_PROVIDER_SITE_OTHER): Payer: Medicare Other | Admitting: Sports Medicine

## 2012-09-30 VITALS — BP 136/60 | HR 67 | Temp 98.1°F | Ht 62.0 in | Wt 205.3 lb

## 2012-09-30 DIAGNOSIS — R079 Chest pain, unspecified: Secondary | ICD-10-CM | POA: Diagnosis not present

## 2012-09-30 DIAGNOSIS — J019 Acute sinusitis, unspecified: Secondary | ICD-10-CM

## 2012-09-30 DIAGNOSIS — E119 Type 2 diabetes mellitus without complications: Secondary | ICD-10-CM | POA: Diagnosis not present

## 2012-09-30 DIAGNOSIS — R0781 Pleurodynia: Secondary | ICD-10-CM

## 2012-09-30 DIAGNOSIS — Z1211 Encounter for screening for malignant neoplasm of colon: Secondary | ICD-10-CM

## 2012-09-30 DIAGNOSIS — I10 Essential (primary) hypertension: Secondary | ICD-10-CM

## 2012-09-30 LAB — POCT GLYCOSYLATED HEMOGLOBIN (HGB A1C): Hemoglobin A1C: 6.4

## 2012-09-30 MED ORDER — METFORMIN HCL 500 MG PO TABS: 500.0000 mg | ORAL_TABLET | Freq: Two times a day (BID) | ORAL | Status: DC

## 2012-09-30 MED ORDER — MELOXICAM 15 MG PO TABS: 15.0000 mg | ORAL_TABLET | Freq: Every day | ORAL | Status: DC

## 2012-09-30 MED ORDER — MOMETASONE FUROATE 50 MCG/ACT NA SUSP: 2.0000 | Freq: Every day | NASAL | Status: DC | PRN

## 2012-09-30 NOTE — Telephone Encounter (Signed)
Pt is calling for a refill of metformin to be called in since her prescription expired. She has an appointment today but wasn't sure if she can make it. Amber [name]

## 2012-09-30 NOTE — Progress Notes (Signed)
  [name]  Patient name: Amber [name] MRN [id number]  Date of birth: 1941/01/19  CC & HPI:  ISATU MACINNES is a 72 y.o. female presenting to clinic.  Patient is here to establish care with me as a new position.  Patient reports that her blood pressure has been well controlled.  She does not check her blood pressure on a regular basis at home.Pt denies chest pain, dyspnea at rest or exertion, PND, lower extremity edema.  Patient denies any facial asymmetry, unilateral weakness, or dysarthria.  Patient denies any orthostasis  Chronic health maintenance: Patient is due for a mammogram as well as colonoscopy.  She denies any melena, hematochezia.  Change in her stools.  Patient reports left-sided flank/chest wall pain.  She reports constant burning pain in the left lower rib cage at the lateral flank.  She reports ending or twisting seems to exacerbate this.  She is not taking any medications for this.  Has been present for approximately one week.  She reports no concerning cardiac symptoms as above.  Patient is on metformin for diabetes.  She denies any polyuria, polydipsia.  She is followed by an eye doctor on a regular basis.  She does have a history of seasonal allergies and was previously on Nasonex that seemed to work well for her.  ROS:  PER HPI  Pertinent History Reviewed:  Medical & Surgical Hx:  Reviewed: Significant for proteinuria on ARB,  Medications: Reviewed & Updated - see associated section Social History: Reviewed -  reports that she has never smoked. She quit smokeless tobacco use about 29 years ago.  Objective Findings:  Vitals: BP 136/60  Pulse 67  Temp(Src) 98.1 F (36.7 C) (Oral)  Ht 5\' 2"  (1.575 m)  Wt 205 lb 4.8 oz (93.123 kg)  BMI 37.54 kg/m2 PE: GENERAL:  adult [place] female. In no discomfort; no respiratory distress  PSYCH:  alert and appropriate, moderate insight   HNEENT:    CARDIO:  RRR, S1/S2 heard, no murmur   LUNGS:  CTA B, no wheezes, no crackles  ABDOMEN:    EXTREM:   GU:   SKIN:   NEUROMSK:  patient has significant tenderness palpation over ribs 8 through 10 on the left.  She is posterior ribs to 8 through 10 on the left.  Tender to palpation with intercostal palpation at this level.      Assessment & Plan:   1. DIABETES MELLITUS II, UNCOMPLICATED   2. Acute sinusitis   3. Rib pain on left side   4. HYPERTENSION, BENIGN SYSTEMIC   5. Special screening for malignant neoplasms, colon    See problem associated charting

## 2012-09-30 NOTE — Assessment & Plan Note (Signed)
Very well controlled.  Patient can stop checking her sugars at home.

## 2012-09-30 NOTE — Assessment & Plan Note (Signed)
Trial of blocks can times one week. Low suspicion for cardiac etiology.  Red flags reviewed. If persistent consider cardiac evaluation.

## 2012-09-30 NOTE — Assessment & Plan Note (Signed)
Stable/Well Controlled - no changes at this time. 

## 2012-09-30 NOTE — Patient Instructions (Addendum)
It was nice to meet you today.  I would like for you to go see a gastroenterologist for a screening colonoscopy.  I will put in this referral for you.  I would also like for you to get an screening mammogram to screen for breast cancer.  I am sending a prescription for meloxicam to be taken daily for the next week to help with your left-sided pain.  If you continue to have symptoms please come back and see me.    It is okay to stop checking your sugars at home your diabetes is well-controlled.

## 2012-09-30 NOTE — Assessment & Plan Note (Addendum)
Prescription for Nasonex refill.

## 2012-10-02 ENCOUNTER — Other Ambulatory Visit: Payer: Self-pay

## 2012-10-02 DIAGNOSIS — Z1231 Encounter for screening mammogram for malignant neoplasm of breast: Secondary | ICD-10-CM

## 2012-10-08 ENCOUNTER — Encounter: Payer: Self-pay | Admitting: Internal Medicine

## 2012-10-26 ENCOUNTER — Ambulatory Visit: Payer: Medicare Other

## 2012-11-05 ENCOUNTER — Ambulatory Visit (INDEPENDENT_AMBULATORY_CARE_PROVIDER_SITE_OTHER): Payer: Medicare Other | Admitting: Family Medicine

## 2012-11-05 VITALS — BP 146/88 | HR 77 | Temp 98.2°F | Wt 203.0 lb

## 2012-11-05 DIAGNOSIS — M62838 Other muscle spasm: Secondary | ICD-10-CM | POA: Diagnosis not present

## 2012-11-05 MED ORDER — CYCLOBENZAPRINE HCL 5 MG PO TABS: 5.0000 mg | ORAL_TABLET | Freq: Three times a day (TID) | ORAL | Status: DC | PRN

## 2012-11-05 MED ORDER — NAPROXEN 375 MG PO TBEC: 375.0000 mg | DELAYED_RELEASE_TABLET | Freq: Two times a day (BID) | ORAL | Status: DC | PRN

## 2012-11-05 NOTE — Patient Instructions (Signed)
Nice to meet you. I believe you have a muscle spasm in your neck. Please try the muscle relaxer and anti-inflammatory I have prescribed.  You should take the anti-inflammatory medication with a meal. If it upsets your stomach please stop taking it. The muscle relaxer can cause some sleepiness. Please try taking this when you are not getting ready to go out of the house.  Cyclobenzaprine tablets What is this medicine? CYCLOBENZAPRINE (sye kloe BEN za preen) is a muscle relaxer. It is used to treat muscle pain, spasms, and stiffness. This medicine may be used for other purposes; ask your health care provider or pharmacist if you have questions. What should I tell my health care provider before I take this medicine? They need to know if you have any of these conditions: -heart disease, irregular heartbeat, or previous heart attack -liver disease -thyroid problem -an unusual or allergic reaction to cyclobenzaprine, tricyclic antidepressants, lactose, other medicines, foods, dyes, or preservatives -pregnant or trying to get pregnant -breast-feeding How should I use this medicine? Take this medicine by mouth with a glass of water. Follow the directions on the prescription label. If this medicine upsets your stomach, take it with food or milk. Take your medicine at regular intervals. Do not take it more often than directed. Talk to your pediatrician regarding the use of this medicine in children. Special care may be needed. Overdosage: If you think you have taken too much of this medicine contact a poison control center or emergency room at once. NOTE: This medicine is only for you. Do not share this medicine with others. What if I miss a dose? If you miss a dose, take it as soon as you can. If it is almost time for your next dose, take only that dose. Do not take double or extra doses. What may interact with this medicine? Do not take this medicine with any of the following  medications: -cisapride -droperidol -flecainide -grepafloxacin -halofantrine -levomethadyl -MAOIs like Carbex, Eldepryl, Marplan, Nardil, and Parnate -nilotinib -pimozide -probucol -sertindole This medicine may also interact with the following medications: -abarelix -alcohol -contrast dyes -dolasetron -guanethidine -medicines for cancer -medicines for depression, anxiety, or psychotic disturbances -medicines to treat an irregular heartbeat -medicines used for sleep or numbness during surgery or procedure -methadone -octreotide -ondansetron -palonosetron -phenothiazines like chlorpromazine, mesoridazine, prochlorperazine, thioridazine -some medicines for infection like alfuzosin, chloroquine, clarithromycin, levofloxacin, mefloquine, pentamidine, troleandomycin -tramadol -vardenafil This list may not describe all possible interactions. Give your health care provider a list of all the medicines, herbs, non-prescription drugs, or dietary supplements you use. Also tell them if you smoke, drink alcohol, or use illegal drugs. Some items may interact with your medicine. What should I watch for while using this medicine? Check with your doctor or health care professional if your condition does not improve within 1 to 3 weeks. You may get drowsy or dizzy when you first start taking the medicine or change doses. Do not drive, use machinery, or do anything that may be dangerous until you know how the medicine affects you. Stand or sit up slowly. Your mouth may get dry. Drinking water, chewing sugarless gum, or sucking on hard candy may help. What side effects may I notice from receiving this medicine? Side effects that you should report to your doctor or health care professional as soon as possible: -allergic reactions like skin rash, itching or hives, swelling of the face, lips, or tongue -chest pain -fast heartbeat -hallucinations -seizures -vomiting Side effects that usually do not  require medical attention (report to your doctor or health care professional if they continue or are bothersome): -headache This list may not describe all possible side effects. Call your doctor for medical advice about side effects. You may report side effects to FDA at 1-800-FDA-1088. Where should I keep my medicine? Keep out of the reach of children. Store at room temperature between 15 and 30 degrees C (59 and 86 degrees F). Keep container tightly closed. Throw away any unused medicine after the expiration date. NOTE: This sheet is a summary. It may not cover all possible information. If you have questions about this medicine, talk to your doctor, pharmacist, or health care provider.  2013, Elsevier/Gold Standard. (05/04/2007 10:26:21 PM)  Naproxen and naproxen sodium oral immediate-release tablets What is this medicine? NAPROXEN (na PROX en) is a non-steroidal anti-inflammatory drug (NSAID). It is used to reduce swelling and to treat pain. This medicine may be used for dental pain, headache, or painful monthly periods. It is also used for painful joint and muscular problems such as arthritis, tendinitis, bursitis, and gout. This medicine may be used for other purposes; ask your health care provider or pharmacist if you have questions. What should I tell my health care provider before I take this medicine? They need to know if you have any of these conditions: -asthma -cigarette smoker -drink more than 3 alcohol containing drinks a day -heart disease or circulation problems such as heart failure or leg edema (fluid retention) -high blood pressure -kidney disease -liver disease -stomach bleeding or ulcers -an unusual or allergic reaction to naproxen, aspirin, other NSAIDs, other medicines, foods, dyes, or preservatives -pregnant or trying to get pregnant -breast-feeding How should I use this medicine? Take this medicine by mouth with a glass of water. Follow the directions on the  prescription label. Take it with food if your stomach gets upset. Try to not lie down for at least 10 minutes after you take it. Take your medicine at regular intervals. Do not take your medicine more often than directed. Long-term, continuous use may increase the risk of heart attack or stroke. A special MedGuide will be given to you by the pharmacist with each prescription and refill. Be sure to read this information carefully each time. Talk to your pediatrician regarding the use of this medicine in children. Special care may be needed. Overdosage: If you think you have taken too much of this medicine contact a poison control center or emergency room at once. NOTE: This medicine is only for you. Do not share this medicine with others. What if I miss a dose? If you miss a dose, take it as soon as you can. If it is almost time for your next dose, take only that dose. Do not take double or extra doses. What may interact with this medicine? -alcohol -aspirin -cidofovir -diuretics -lithium -methotrexate -other drugs for inflammation like ketorolac or prednisone -pemetrexed -probenecid -warfarin This list may not describe all possible interactions. Give your health care provider a list of all the medicines, herbs, non-prescription drugs, or dietary supplements you use. Also tell them if you smoke, drink alcohol, or use illegal drugs. Some items may interact with your medicine. What should I watch for while using this medicine? Tell your doctor or health care professional if your pain does not get better. Talk to your doctor before taking another medicine for pain. Do not treat yourself. This medicine does not prevent heart attack or stroke. In fact, this medicine may increase the chance  of a heart attack or stroke. The chance may increase with longer use of this medicine and in people who have heart disease. If you take aspirin to prevent heart attack or stroke, talk with your doctor or health care  professional. Do not take other medicines that contain aspirin, ibuprofen, or naproxen with this medicine. Side effects such as stomach upset, nausea, or ulcers may be more likely to occur. Many medicines available without a prescription should not be taken with this medicine. This medicine can cause ulcers and bleeding in the stomach and intestines at any time during treatment. Do not smoke cigarettes or drink alcohol. These increase irritation to your stomach and can make it more susceptible to damage from this medicine. Ulcers and bleeding can happen without warning symptoms and can cause death. You may get drowsy or dizzy. Do not drive, use machinery, or do anything that needs mental alertness until you know how this medicine affects you. Do not stand or sit up quickly, especially if you are an older patient. This reduces the risk of dizzy or fainting spells. This medicine can cause you to bleed more easily. Try to avoid damage to your teeth and gums when you brush or floss your teeth. What side effects may I notice from receiving this medicine? Side effects that you should report to your doctor or health care professional as soon as possible: -black or bloody stools, blood in the urine or vomit -blurred vision -chest pain -difficulty breathing or wheezing -nausea or vomiting -severe stomach pain -skin rash, skin redness, blistering or peeling skin, hives, or itching -slurred speech or weakness on one side of the body -swelling of eyelids, throat, lips -unexplained weight gain or swelling -unusually weak or tired -yellowing of eyes or skin Side effects that usually do not require medical attention (report to your doctor or health care professional if they continue or are bothersome): -constipation -headache -heartburn This list may not describe all possible side effects. Call your doctor for medical advice about side effects. You may report side effects to FDA at 1-800-FDA-1088. Where  should I keep my medicine? Keep out of the reach of children. Store at room temperature between 15 and 30 degrees C (59 and 86 degrees F). Keep container tightly closed. Throw away any unused medicine after the expiration date. NOTE: This sheet is a summary. It may not cover all possible information. If you have questions about this medicine, talk to your doctor, pharmacist, or health care provider.  2012, Elsevier/Gold Standard. (01/23/2009 8:10:16 PM)

## 2012-11-05 NOTE — Progress Notes (Signed)
Patient ID: Amber [name], female   DOB: 1940-07-01, 72 y.o.   MRN: [id number] Amber [name] is a 72 y.o. female who presents today for neck pain and spasm.  Started one week ago following turning her head quickly to look at someone behind her. Noticed right neck to middle of back with pain and tenderness. Now states she can't hardly turn her head to the right. Denies numbness and tingling. No headache.  Has tried tylenol with minimal benefit.   Past Medical History  Diagnosis Date  . Diabetes mellitus   . Hypertension   . Arthritis   . Back pain     History  Smoking status  . Never Smoker   Smokeless tobacco  . Former Neurosurgeon  . Quit date: 02/05/1983    Family History  Problem Relation Age of Onset  . Pancreatic cancer Sister     3 sisters died of it  . Heart attack Father 70    died  . Diabetes Mother   . Hypertension Son   . Hypertension Daughter     Current Outpatient Prescriptions on File Prior to Visit  Medication Sig Dispense Refill  . aspirin EC 81 MG tablet Take 81 mg by mouth daily.      Marland Kitchen glucose blood (ACCU-CHEK INSTANT GLUCOSE TEST) test strip Use as instructed. Test blood sugar twice daily  100 each  0  . losartan-hydrochlorothiazide (HYZAAR) 100-12.5 MG per tablet Take 1 tablet by mouth daily.  90 tablet  3  . meloxicam (MOBIC) 15 MG tablet Take 1 tablet (15 mg total) by mouth daily.  30 tablet  0  . metFORMIN (GLUCOPHAGE) 500 MG tablet Take 1 tablet (500 mg total) by mouth 2 (two) times daily with a meal.  60 tablet  3  . metoprolol (LOPRESSOR) 50 MG tablet Take 1 tablet (50 mg total) by mouth 2 (two) times daily.  180 tablet  3  . mometasone (NASONEX) 50 MCG/ACT nasal spray Place 2 sprays into the nose daily as needed. For allergy symptoms  17 g  5  . Multiple Vitamins-Minerals (WOMENS ONE DAILY) TABS Take 1 tablet by mouth daily.      Bertram Gala Glycol-Propyl Glycol (SYSTANE OP) Apply 2 drops to eye 2 (two) times daily.      . pravastatin (PRAVACHOL)  40 MG tablet take 1 tablet by mouth every evening  30 tablet  11   No current facility-administered medications on file prior to visit.    ROS: Per HPI   Physical Exam Filed Vitals:   11/05/12 0901  BP: 146/88  Pulse: 77  Temp: 98.2 F (36.8 C)    Physical Examination: General appearance - alert, well appearing, and in no distress Neck - supple, no significant adenopathy Back exam - right upper back in area of trapezius with tightness and spasm Neurological - bilateral UE with 5/5 strength, normal sensation to light touch Musculoskeletal - neck with full ROM, TTP and spasm through the right side in to the right upper back   Assessment/Plan: Please see individual problem list.

## 2012-11-05 NOTE — Assessment & Plan Note (Signed)
Patient with acute onset spasm and pain in neck and upper back following identifiable event. Will treat with flexeril 5 mg TID prn and naproxen EC 375 mg BID prn. Advised to take naproxen with food and that if it were to upset her stomach she should stop them. Also to be carefull taking flexeril as this can make her drowsy. Noted that her most recent Cr was 0.89.

## 2012-11-06 ENCOUNTER — Other Ambulatory Visit: Payer: Self-pay | Admitting: Family Medicine

## 2012-11-30 ENCOUNTER — Emergency Department (HOSPITAL_COMMUNITY)
Admission: EM | Admit: 2012-11-30 | Discharge: 2012-11-30 | Disposition: A | Discharge status: Home / Self Care | Payer: Medicare Other | Attending: Emergency Medicine | Admitting: Emergency Medicine

## 2012-11-30 ENCOUNTER — Encounter (HOSPITAL_COMMUNITY): Payer: Self-pay | Admitting: Emergency Medicine

## 2012-11-30 ENCOUNTER — Emergency Department (HOSPITAL_COMMUNITY): Payer: Medicare Other

## 2012-11-30 DIAGNOSIS — R262 Difficulty in walking, not elsewhere classified: Secondary | ICD-10-CM | POA: Insufficient documentation

## 2012-11-30 DIAGNOSIS — Z880 Allergy status to penicillin: Secondary | ICD-10-CM | POA: Insufficient documentation

## 2012-11-30 DIAGNOSIS — Z881 Allergy status to other antibiotic agents status: Secondary | ICD-10-CM | POA: Diagnosis not present

## 2012-11-30 DIAGNOSIS — Z79899 Other long term (current) drug therapy: Secondary | ICD-10-CM | POA: Diagnosis not present

## 2012-11-30 DIAGNOSIS — E119 Type 2 diabetes mellitus without complications: Secondary | ICD-10-CM | POA: Diagnosis not present

## 2012-11-30 DIAGNOSIS — M25529 Pain in unspecified elbow: Secondary | ICD-10-CM | POA: Insufficient documentation

## 2012-11-30 DIAGNOSIS — M129 Arthropathy, unspecified: Secondary | ICD-10-CM | POA: Diagnosis not present

## 2012-11-30 DIAGNOSIS — M79609 Pain in unspecified limb: Secondary | ICD-10-CM | POA: Diagnosis not present

## 2012-11-30 DIAGNOSIS — Z7982 Long term (current) use of aspirin: Secondary | ICD-10-CM | POA: Diagnosis not present

## 2012-11-30 DIAGNOSIS — M25579 Pain in unspecified ankle and joints of unspecified foot: Secondary | ICD-10-CM | POA: Diagnosis not present

## 2012-11-30 DIAGNOSIS — Z882 Allergy status to sulfonamides status: Secondary | ICD-10-CM | POA: Diagnosis not present

## 2012-11-30 DIAGNOSIS — I10 Essential (primary) hypertension: Secondary | ICD-10-CM | POA: Diagnosis not present

## 2012-11-30 DIAGNOSIS — W010XXA Fall on same level from slipping, tripping and stumbling without subsequent striking against object, initial encounter: Secondary | ICD-10-CM | POA: Insufficient documentation

## 2012-11-30 DIAGNOSIS — Z885 Allergy status to narcotic agent status: Secondary | ICD-10-CM | POA: Diagnosis not present

## 2012-11-30 DIAGNOSIS — Y9301 Activity, walking, marching and hiking: Secondary | ICD-10-CM | POA: Insufficient documentation

## 2012-11-30 DIAGNOSIS — S59909A Unspecified injury of unspecified elbow, initial encounter: Secondary | ICD-10-CM | POA: Diagnosis not present

## 2012-11-30 DIAGNOSIS — Y92009 Unspecified place in unspecified non-institutional (private) residence as the place of occurrence of the external cause: Secondary | ICD-10-CM | POA: Insufficient documentation

## 2012-11-30 DIAGNOSIS — S8990XA Unspecified injury of unspecified lower leg, initial encounter: Secondary | ICD-10-CM | POA: Diagnosis not present

## 2012-11-30 DIAGNOSIS — M7989 Other specified soft tissue disorders: Secondary | ICD-10-CM | POA: Diagnosis not present

## 2012-11-30 DIAGNOSIS — W2203XA Walked into furniture, initial encounter: Secondary | ICD-10-CM | POA: Insufficient documentation

## 2012-11-30 DIAGNOSIS — S8010XA Contusion of unspecified lower leg, initial encounter: Secondary | ICD-10-CM | POA: Diagnosis not present

## 2012-11-30 MED ORDER — HYDROCODONE-ACETAMINOPHEN 5-325 MG PO TABS
1.0000 | ORAL_TABLET | Freq: Once | ORAL | Status: AC
  Administered 2012-11-30: 1 via ORAL
  Filled 2012-11-30: qty 1

## 2012-11-30 MED ORDER — HYDROCODONE-ACETAMINOPHEN 5-325 MG PO TABS: 1.0000 | ORAL_TABLET | Freq: Three times a day (TID) | ORAL | Status: DC | PRN

## 2012-11-30 NOTE — ED Provider Notes (Signed)
CSN: [id number]     Arrival date & time 11/30/12  7253 History   First MD Initiated Contact with Patient 11/30/12 [phone]     Chief Complaint  Patient presents with  . Leg Injury   (Consider location/radiation/quality/duration/timing/severity/associated sxs/prior Treatment) HPI Comments: Patient is a 72 year old female past medical history significant for DM, hypertension, arthritis, back pain presented to the emergency department for right lower leg pain that occurred after patient tripped and hit her right shin on piece of furniture. The patient states that she was awoken by telephone, did not turn the light on, caught her foot causing her to fall into a piece of furniture. Patient states she did not fall down, she did not hit her head or have loss of consciousness or vomiting. Patient is endorsing 10 of 10 sharp right lower leg pain with radiation into ankle causing difficulty ambulating. Patient is also complaining of mild right elbow pain without radiation, stating a sore in nature. She states she caught herself with her right arm along with her right leg. Patient denies having any numbness or weakness, chest pain, shortness of breath, headache. Patient takes one 81 mg aspirin daily, but is not on any other blood thinners.  The history is provided by the patient.    Past Medical History  Diagnosis Date  . Diabetes mellitus   . Hypertension   . Arthritis   . Back pain    Past Surgical History  Procedure Laterality Date  . Colon surgery    . Abdominal hysterectomy     Family History  Problem Relation Age of Onset  . Pancreatic cancer Sister     3 sisters died of it  . Heart attack Father 35    died  . Diabetes Mother   . Hypertension Son   . Hypertension Daughter    History  Substance Use Topics  . Smoking status: Never Smoker   . Smokeless tobacco: Former Neurosurgeon    Quit date: 02/05/1983  . Alcohol Use: No   OB History   Grav Para Term Preterm Abortions TAB SAB Ect Mult  Living                 Review of Systems  Constitutional: Negative for fever and chills.  HENT: Negative.   Eyes: Negative.   Respiratory: Negative for shortness of breath.   Cardiovascular: Negative for chest pain.  Gastrointestinal: Negative for nausea, vomiting and abdominal pain.  Genitourinary: Negative.   Musculoskeletal: Positive for arthralgias and myalgias. Negative for neck pain and neck stiffness.  Skin: Positive for color change (bruising). Negative for pallor, rash and wound.  Neurological: Negative for syncope, weakness, light-headedness, numbness and headaches.    Allergies  Amoxicillin; Codeine; Penicillins; Sulfamethoxazole; and Sulfonamide derivatives  Home Medications   Current Outpatient Rx  Name  Route  Sig  Dispense  Refill  . aspirin EC 81 MG tablet   Oral   Take 81 mg by mouth daily.         . cyclobenzaprine (FLEXERIL) 5 MG tablet   Oral   Take 1 tablet (5 mg total) by mouth 3 (three) times daily as needed for muscle spasms.   30 tablet   0   . losartan-hydrochlorothiazide (HYZAAR) 100-12.5 MG per tablet   Oral   Take 1 tablet by mouth daily.   90 tablet   3   . meloxicam (MOBIC) 15 MG tablet   Oral   Take 1 tablet (15 mg total) by mouth daily.  30 tablet   0   . metFORMIN (GLUCOPHAGE) 500 MG tablet   Oral   Take 1 tablet (500 mg total) by mouth 2 (two) times daily with a meal.   60 tablet   3   . metoprolol (LOPRESSOR) 50 MG tablet   Oral   Take 1 tablet (50 mg total) by mouth 2 (two) times daily.   180 tablet   3   . mometasone (NASONEX) 50 MCG/ACT nasal spray   Nasal   Place 2 sprays into the nose daily as needed. For allergy symptoms   17 g   5   . Multiple Vitamins-Minerals (WOMENS ONE DAILY) TABS   Oral   Take 1 tablet by mouth daily.         . Naproxen 375 MG TBEC   Oral   Take 1 tablet (375 mg total) by mouth 2 (two) times daily between meals as needed.   30 each   0   . Polyethyl Glycol-Propyl Glycol  (SYSTANE OP)   Both Eyes   Place 2 drops into both eyes 2 (two) times daily.          . pravastatin (PRAVACHOL) 40 MG tablet      take 1 tablet by mouth every evening   30 tablet   0   . glucose blood (ACCU-CHEK INSTANT GLUCOSE TEST) test strip      Use as instructed. Test blood sugar twice daily   100 each   0   . HYDROcodone-acetaminophen (NORCO/VICODIN) 5-325 MG per tablet   Oral   Take 1 tablet by mouth every 8 (eight) hours as needed for pain.   6 tablet   0    BP 125/56  Pulse 58  Temp(Src) 97.9 F (36.6 C) (Oral)  Resp 18  SpO2 97% Physical Exam  Constitutional: She is oriented to person, place, and time. She appears well-developed and well-nourished. No distress.  HENT:  Head: Normocephalic and atraumatic.  Right Ear: External ear normal.  Left Ear: External ear normal.  Nose: Nose normal.  Eyes: Conjunctivae and EOM are normal. Pupils are equal, round, and reactive to light.  Neck: Normal range of motion. Neck supple.  Cardiovascular: Normal rate, regular rhythm, normal heart sounds and intact distal pulses.   Pulmonary/Chest: Effort normal and breath sounds normal. No respiratory distress.  Abdominal: Soft. Bowel sounds are normal. There is no tenderness.  Musculoskeletal: She exhibits no edema.       Right hip: Normal.       Right knee: Normal.       Left knee: Normal.       Right ankle: She exhibits decreased range of motion. She exhibits no swelling, no ecchymosis, no deformity, no laceration and normal pulse. Tenderness. Medial malleolus tenderness found. Achilles tendon normal.       Left ankle: Normal.       Right lower leg: She exhibits tenderness. She exhibits no bony tenderness, no swelling, no edema, no deformity and no laceration.       Left lower leg: Normal.       Legs:      Right foot: She exhibits decreased range of motion and tenderness. She exhibits no bony tenderness, no swelling, normal capillary refill, no crepitus, no deformity and no  laceration.       Left foot: Normal.  Decreased ROM of R LE d/t pain.   Neurological: She is alert and oriented to person, place, and time. She has normal strength. No  cranial nerve deficit or sensory deficit. GCS eye subscore is 4. GCS verbal subscore is 5. GCS motor subscore is 6.  No pronator drift.   Skin: Skin is warm and dry. She is not diaphoretic.  Psychiatric: She has a normal mood and affect.    ED Course  Procedures (including critical care time) Medications  HYDROcodone-acetaminophen (NORCO/VICODIN) 5-325 MG per tablet 1 tablet (1 tablet Oral Given 11/30/12 0631)    Labs Review Labs Reviewed - No data to display Imaging Review Dg Elbow Complete Right  11/30/2012   CLINICAL DATA:  Larey Seat.  Right elbow pain.  EXAM: RIGHT ELBOW - COMPLETE 3+ VIEW  COMPARISON:  None.  FINDINGS: The joint spaces are maintained. No acute fractures identified. No joint effusion. Olecranon spurring is noted.  IMPRESSION: No acute fracture.   Electronically Signed   By: Loralie Champagne M.D.   On: 11/30/2012 07:14   Dg Tibia/fibula Right  11/30/2012   CLINICAL DATA:  Larey Seat.  Pain and swelling.  EXAM: RIGHT TIBIA AND FIBULA - 2 VIEW  COMPARISON:  None.  FINDINGS: The knee and ankle joints are maintained. No acute fracture of the tibia or fibula. Focal area of mild cortical thickening involving the lateral aspect of the mid tibia is most likely related to remote trauma.  IMPRESSION: No acute bony findings.   Electronically Signed   By: Loralie Champagne M.D.   On: 11/30/2012 07:12   Dg Ankle Complete Right  11/30/2012   CLINICAL DATA:  Larey Seat. Right ankle pain.  EXAM: RIGHT ANKLE - COMPLETE 3+ VIEW  COMPARISON:  None.  FINDINGS: The ankle mortise is maintained. No acute ankle fracture or osteochondral abnormality. The visualized mid and hindfoot bony structures are intact. Calcaneal spurring changes are noted.  IMPRESSION: No acute bony findings.   Electronically Signed   By: Loralie Champagne M.D.   On:  11/30/2012 07:14    EKG Interpretation   None       MDM   1. Lower leg pain, right     Afebrile, NAD, non-toxic appearing, AAOx4. Neurovascularly intact. No sensory deficit. No neurofocal deficits. Patient X-Ray negative for obvious fracture or dislocation. Pain managed in ED. Pt advised to follow up with PCP if symptoms persist for possibility of missed fracture diagnosis. Conservative therapy recommended and discussed. Patient will be dc home & is agreeable with above plan. Patient d/w with Dr. Clarene Duke, agrees with plan.        Jeannetta Ellis, PA-C 11/30/12 [phone]

## 2012-11-30 NOTE — ED Notes (Addendum)
Pt reports she was walking in living room with no light on and tripped over something in her living room and fell and injured right shin. Hematoma noted to right shin area, notable swelling, tender to touch. Pedal pulse strong. Pt also reports mild right elbow pain. Pt denies hitting her head and verbalizing no other complaints at this moment. A&Ox4.

## 2012-12-03 NOTE — ED Provider Notes (Signed)
Medical screening examination/treatment/procedure(s) were performed by non-physician practitioner and as supervising physician I was immediately available for consultation/collaboration.  EKG Interpretation   None         Judithe Keetch M Nyheem Binette, DO 12/03/12 2037 

## 2012-12-07 ENCOUNTER — Ambulatory Visit (INDEPENDENT_AMBULATORY_CARE_PROVIDER_SITE_OTHER): Payer: Medicare Other | Admitting: Family Medicine

## 2012-12-07 ENCOUNTER — Encounter: Payer: Self-pay | Admitting: Family Medicine

## 2012-12-07 VITALS — BP 163/81 | HR 61 | Ht 62.0 in | Wt 205.0 lb

## 2012-12-07 DIAGNOSIS — M7989 Other specified soft tissue disorders: Secondary | ICD-10-CM

## 2012-12-07 MED ORDER — HYDROCODONE-ACETAMINOPHEN 5-325 MG PO TABS: 1.0000 | ORAL_TABLET | Freq: Three times a day (TID) | ORAL | Status: DC | PRN

## 2012-12-07 NOTE — Patient Instructions (Signed)
Thank you for coming in, today!  We think you might have a collection of bleeding in the front of your leg This is basically a big bruise, we think We don't think you have a clot in your vein We want you to get a CT scan of your leg to help [name] figure out what this is.  We will try to get this scheduled for you today or tomorrow. In the meantime, you can keep taking your pain medicine as before, which I will refill today. Keep doing the same supportive care you have been (ice, rest, elevation, and so on).  We will call you with the result of the scan and tell you what, if anything you should do. We will let you know at that point when you should come back to see Dr. Berline Chough. Please feel free to call with any questions or concerns at any time, at [phone]. --Dr. Casper Harrison

## 2012-12-07 NOTE — Assessment & Plan Note (Addendum)
Discussed/examined with Dr. Jennette Kettle. Right anterior/medial tibial region with tender soft-tissue swelling in setting of recent fall. Right calf diameter > left, but no palpable cords or posterior tenderness/swellling so doubt venous clot. Distal pulses intact and pain not out of proportion to exam, feet warm/well-perfused, so strongly doubt compartment syndrome.  Suspect hematoma or bone pathology not obvious on recent plain films. Plan for CT of right lower leg to better characterize (completed peer-to-peer with insurance MD for prior auth); if hematoma, will continue with supportive care. Will f/u CT result with pt and advise further. Refilled Norco in the meantime and continue RICE measures. Note also copied to PCP Dr. Berline Chough.

## 2012-12-07 NOTE — Progress Notes (Addendum)
  Subjective:    Patient ID: Amber [name], female    DOB: 12/11/1940, 72 y.o.   MRN: [id number]  HPI: Pt presents to clinic for SDA for fall last week on 10/27. She was walking in her bedroom and her leg "went out from under her." She had xrays then that didn't show any fractures. She has had continued swelling and pain. Pain has been some better, but has been intermittently "about the same, it'll come back all at once." The leg is also swollen; she states her legs both swell, but she has not injured the other leg. She is uncertain if she has frank heart failure, but she does take BP medication and a "fluid pill" (HCTZ). She has been keeping her leg elevated and wrapped, as well as support hose (reports 3-4 hours daily, removes them because they cause her legs to hurt). She has been taking Norco for pain, with good relief.  Review of Systems: As above. Denies fevers/chills, SOB, coughing or fast heartbeat or palpitations. Denies hx of blood clots to her knowledge.     Objective:   Physical Exam BP 163/81  Pulse 61  Ht 5\' 2"  (1.575 m)  Wt 205 lb (92.987 kg)  BMI 37.49 kg/m2 Gen: elderly female in NAD Cardio: RRR, no murmur Lung: CTAB, no wheezes Lower ext: bilateral pitting edema, R > L; right calf 41.25 cm in diameter 10 cm inf to knee, left 40 cm  Right shin with shiny, tense skin, and increased erythema around ankle  Medial-anterior tibial region with approx 2 cm area of swelling and tenderness but no induration  No posterior cords or tenderness  Distal pulse intact, feet warm and nontender     Assessment & Plan:  Discussed with and examined with Dr. Jennette Kettle.  Not likely venous clot given location of soft tissue swelling. Concern for hematoma.  Unlikely compartment syndrome (pain not out of proportion, distally neurovascularly intact). Ordered for CT scan of LE (discussed peer-to-peer with insurance MD for prior auth). See problem list note.

## 2012-12-08 ENCOUNTER — Encounter: Payer: Medicare Other | Admitting: Internal Medicine

## 2012-12-08 ENCOUNTER — Telehealth: Payer: Self-pay | Admitting: Family Medicine

## 2012-12-08 ENCOUNTER — Ambulatory Visit (HOSPITAL_COMMUNITY)
Admission: RE | Admit: 2012-12-08 | Discharge: 2012-12-08 | Disposition: A | Discharge status: Home / Self Care | Payer: Medicare Other | Source: Ambulatory Visit | Attending: Family Medicine | Admitting: Family Medicine

## 2012-12-08 DIAGNOSIS — M79609 Pain in unspecified limb: Secondary | ICD-10-CM | POA: Diagnosis not present

## 2012-12-08 DIAGNOSIS — M7730 Calcaneal spur, unspecified foot: Secondary | ICD-10-CM | POA: Diagnosis not present

## 2012-12-08 DIAGNOSIS — IMO0002 Reserved for concepts with insufficient information to code with codable children: Secondary | ICD-10-CM | POA: Insufficient documentation

## 2012-12-08 DIAGNOSIS — S9000XA Contusion of unspecified ankle, initial encounter: Secondary | ICD-10-CM | POA: Diagnosis not present

## 2012-12-08 DIAGNOSIS — M7989 Other specified soft tissue disorders: Secondary | ICD-10-CM

## 2012-12-08 DIAGNOSIS — S8010XA Contusion of unspecified lower leg, initial encounter: Secondary | ICD-10-CM | POA: Insufficient documentation

## 2012-12-08 DIAGNOSIS — M778 Other enthesopathies, not elsewhere classified: Secondary | ICD-10-CM | POA: Insufficient documentation

## 2012-12-08 NOTE — Telephone Encounter (Signed)
Called pt to discuss CT result. Most likely hematoma. Per pt, pain has improved "a little bit" and her swelling is not any worse. Reiterated supportive care (RICE measures, pain medication, etc) and instructed her to follow up in 1-2 weeks with PCP Dr. Berline Chough. Also discussed red flags such as worsening pain, numbness of feet, worse swelling/redness of the leg, increased pain in her calf or other areas where there was none previously, etc. Pt voiced understanding and expressed appreciation for call. Forwarded to Dr. Berline Chough and have discussed with him, as well. --CMS

## 2012-12-10 ENCOUNTER — Telehealth: Payer: Self-pay | Admitting: Sports Medicine

## 2012-12-10 MED ORDER — PRAVASTATIN SODIUM 40 MG PO TABS: 40.0000 mg | ORAL_TABLET | Freq: Every day | ORAL | Status: DC

## 2012-12-10 NOTE — Telephone Encounter (Signed)
Patient is needing a refill of her cholesterol medicine called in to [facility] on [address].

## 2012-12-10 NOTE — Telephone Encounter (Signed)
Rx sent in

## 2012-12-15 ENCOUNTER — Encounter: Payer: Self-pay | Admitting: Sports Medicine

## 2012-12-15 ENCOUNTER — Ambulatory Visit (INDEPENDENT_AMBULATORY_CARE_PROVIDER_SITE_OTHER): Payer: Medicare Other | Admitting: Sports Medicine

## 2012-12-15 VITALS — BP 168/75 | HR 68 | Temp 98.1°F | Ht 62.0 in | Wt 203.5 lb

## 2012-12-15 DIAGNOSIS — I10 Essential (primary) hypertension: Secondary | ICD-10-CM | POA: Diagnosis not present

## 2012-12-15 DIAGNOSIS — M7989 Other specified soft tissue disorders: Secondary | ICD-10-CM

## 2012-12-15 DIAGNOSIS — E119 Type 2 diabetes mellitus without complications: Secondary | ICD-10-CM | POA: Diagnosis not present

## 2012-12-15 DIAGNOSIS — E785 Hyperlipidemia, unspecified: Secondary | ICD-10-CM | POA: Diagnosis not present

## 2012-12-15 NOTE — Progress Notes (Signed)
[facility] Amber [name] - 72 y.o. female MRN [id number]  Date of birth: 1940/03/04  CC, HPI, Interval History & ROS  Amber [name] is here today to followup on an acute issue with her right leg.  She was seen in the emergency department on October 27 2 to a fall.  She was seen in follow up by Dr. Casper Harrison on 12/07/2012 do to worsening pain of her right lower leg and a CT scan of the leg was obtained at that time that showed a hematoma on the anterior medial aspect of the right distal tibial diaphysis.  She reports since that time her pain has been fairly stable, slightly improving.  She has been attempting to elevate her leg and is occasionally requiring pain medications to help her sleep at night. Pt denies any fevers, chills, or rigors.  She denies any significant skin changes in this area but does think that it may be slightly more red.    Additionally she has tried some ibuprofen without significant relief.   She has compression socks from prior venous insufficiency but has not been wearing them recently.   her chronic medical conditions including:  Diabetes, hyperlipidemia, hypertension  Pt denies chest pain, dyspnea at rest or exertion, PND, lower extremity edema.  Patient denies any facial asymmetry, unilateral weakness, or dysarthria.  Pt denies hypoglycemic symptoms/episodes.  No reported polyuria/polydipsia. Pt is compliant with foot exams and denies any new foot lesions except as above.     Pertinent History & Care Coordination  No Patient Care Coordination Note on file.  History  Smoking status  . Never Smoker   Smokeless tobacco  . Former Neurosurgeon  . Quit date: 02/05/1983   Health Maintenance Due  Topic  . Foot Exam   . Colonoscopy   . Zostavax   . Urine Microalbumin   . Influenza Vaccine     Recent Labs  02/12/12 1651 05/01/12 1156 05/13/12 1447 09/30/12 1635  HGBA1C 6.6  --  6.5 6.4  TSH  --  4.681*  --   --      Otherwise past Medical,  Surgical, Social, and Family History Reviewed per EMR Medications and Allergies reviewed and all updated if necessary. Objective Findings  VITALS: HR: 68 bpm  BP: 168/75 mmHg  TEMP: 98.1 F (36.7 C) (Oral)  RESP:    HT: 5\' 2"  (157.5 cm)  WT: 203 lb 8 oz (92.307 kg)  BMI: 37.3   BP Readings from Last 3 Encounters:  12/15/12 168/75  12/07/12 163/81  11/30/12 125/56   Wt Readings from Last 3 Encounters:  12/15/12 203 lb 8 oz (92.307 kg)  12/07/12 205 lb (92.987 kg)  11/05/12 203 lb (92.08 kg)     PHYSICAL EXAM: GENERAL:  adult [place] female. In no discomfort; no respiratory distress  PSYCH: alert and appropriate, good insight   HNEENT:  no JVD   CARDIO: RRR, S1/S2 heard, no murmur  LUNGS: CTA B, no wheezes, no crackles  ABDOMEN:   EXTREM:  Warm, well perfused.  Moves all 4 extremities spontaneously; no lateralization.  She has a 5 x 8 cm area on the anterior medial aspect of her shin that is swollen, nonfluctuant.  There is minimal surrounding erythema without increased heat or significant breakdown of the skin.  No noted foot lesions.  Distal pulses 2+/4, capillary refill less than 3 seconds.  1+ pretibial edema generally in addition to the focal area of swelling.    GU:  SKIN:     Assessment & Plan   Problems addressed today: General Plan & Pt Instructions:  1. Right leg swelling       Use your compression socks daily.  Use ice and or heat as often as needed.  Avoid taking ibuprofen, naproxen, Aleve.  It is okay to take Tylenol.  If you need more pain medication let me know  Watch out for any worsening redness, feeling sick, fevers or chills.     For further discussion of A/P and for follow up issues see problem based charting.

## 2012-12-15 NOTE — Patient Instructions (Signed)
   Use your compression socks daily.  Use ice and or heat as often as needed.  Avoid taking ibuprofen, naproxen, Aleve.  It is okay to take Tylenol.  If you need more pain medication let me know  Watch out for any worsening redness, feeling sick, fevers or chills.   If you need anything prior to your next visit please call the clinic. Please Bring all medications or accurate medication list with you to each appointment; an accurate medication list is essential in providing you the best care possible.

## 2012-12-16 NOTE — Assessment & Plan Note (Addendum)
Symptoms and physical exam consistent with hematoma and lesion seems to be stable.  There is no evidence of infection today.  However the area is still swollen and tender to palpation.  I've encouraged her to use heat and ice alternating, compression socks during the day, elevation above her heart, and Tylenol for pain.  Avoiding NSAIDs to help decrease risk of continued bleeding.  She is only on a baby aspirin but should continue this at this time.  I reviewed red flags for worsening symptoms such as an infected hematoma but suspect that this will resolve on its own over the coming months.  I discussed the expected duration of symptoms and she voices understanding.  She does still have pain with palpation but reports overall her pain is improved.  Willing to provide additional refills of pain medication but hopeful she will no longer need these.

## 2012-12-16 NOTE — Assessment & Plan Note (Signed)
Stable/Well Controlled - no changes at this time. 

## 2012-12-16 NOTE — Assessment & Plan Note (Signed)
Slightly elevated today.  Recommend avoid NSAIDs as this may be driving up her pressure as well.  Given she is still in pain I am also hesitant to increase her blood pressure medicine given we are unclear about the etiology of her fall. > Consider increasing diuretic if continues to have worsening blood pressures and positive volume status at next visit

## 2013-01-11 ENCOUNTER — Other Ambulatory Visit: Payer: Self-pay | Admitting: Sports Medicine

## 2013-01-11 ENCOUNTER — Telehealth: Payer: Self-pay | Admitting: Sports Medicine

## 2013-01-11 MED ORDER — PRAVASTATIN SODIUM 40 MG PO TABS: 40.0000 mg | ORAL_TABLET | Freq: Every day | ORAL | Status: DC

## 2013-01-11 NOTE — Telephone Encounter (Signed)
Patient having arm pain. Would like to speak to nurse.

## 2013-01-22 NOTE — Telephone Encounter (Signed)
Tried calling, no answer, no vm, will try again later.

## 2013-02-01 ENCOUNTER — Other Ambulatory Visit: Payer: Self-pay | Admitting: Sports Medicine

## 2013-02-01 MED ORDER — METFORMIN HCL 500 MG PO TABS: 500.0000 mg | ORAL_TABLET | Freq: Two times a day (BID) | ORAL | Status: DC

## 2013-03-11 ENCOUNTER — Ambulatory Visit: Payer: Medicare Other

## 2013-03-12 ENCOUNTER — Encounter: Payer: Self-pay | Admitting: Family Medicine

## 2013-03-12 ENCOUNTER — Ambulatory Visit (INDEPENDENT_AMBULATORY_CARE_PROVIDER_SITE_OTHER): Payer: Medicare Other | Admitting: Family Medicine

## 2013-03-12 VITALS — BP 169/73 | HR 65 | Temp 97.8°F

## 2013-03-12 DIAGNOSIS — M7520 Bicipital tendinitis, unspecified shoulder: Secondary | ICD-10-CM | POA: Diagnosis not present

## 2013-03-12 DIAGNOSIS — M7521 Bicipital tendinitis, right shoulder: Secondary | ICD-10-CM

## 2013-03-12 DIAGNOSIS — I10 Essential (primary) hypertension: Secondary | ICD-10-CM

## 2013-03-12 DIAGNOSIS — M79601 Pain in right arm: Secondary | ICD-10-CM | POA: Insufficient documentation

## 2013-03-12 MED ORDER — NAPROXEN 375 MG PO TBEC: 375.0000 mg | DELAYED_RELEASE_TABLET | Freq: Two times a day (BID) | ORAL | Status: DC | PRN

## 2013-03-12 NOTE — Assessment & Plan Note (Signed)
Clinical suspicion of bicipital tendinitis on R, from overuse.  Plan for rest, short course NSAIDs, return for follow up.  Her renal function last Spring was unremarkable.  Discussed using the naproxen ONLY for 5 to 7 days and not to continue as scheduled medication indefinitely.

## 2013-03-12 NOTE — Progress Notes (Signed)
   Subjective:    Patient ID: Amber [name], female    DOB: [birth year], 73 y.o.   MRN: [id number]  HPI Patient seen today for SDA for complaint of pain along the R arm; specifically she points to anterior aspect of R elbow up along the biceps muscle distribution.  May have had mild pain over the past couple of months (I reminded her of her telephone call in early December about R arm pain), she says it has been much worse in the past 3 weeks. She is R hand dominant, is used to doing a lot of sweeping, mopping and other repetitive motions with her arms.  No falls, no trauma to arm.  She did have a fall on 27 October and suffered from R leg pain at that time, but did not have any arm or shoulder pain then.   She has tried Tylenol, heating pads and alcohol rubs without improvement.    Review of Systems     Objective:   Physical Exam Well appearing, no apparent distress HEENT Neck supple.  MSK: Able to abduct her R arm above her head actively when shoulder joint is isolated.  No visible distortions of anatomy around R shoulder.  There is mild increase in R biceps girth compared to L, without erythema or ecchymosis.  Able to flex/extend the R elbow, no 'popeye' appearance to indicate biceps tendon rupture. Tenderness to palpate along the biceps muscle R side.  Painful to counter resistance at R elbow with flexion, also with internal rotation.  Handgrip full and symmetric, sensation in distal fingers symmetric and full bilaterally. Brisk cap refill in fingers bilat.  Palpable radial pulses bilaterally.        Assessment & Plan:

## 2013-03-12 NOTE — Patient Instructions (Addendum)
It was a pleasure to see you today.  I believe the pain you have in your right arm/shoulder is related to inflammation of the biceps tendon ("bicipital tendinitis").   I recommend a 5-day course of an anti-inflammatory medication (Naproxen 375mg  twice daily) and rest of the right arm.   I would like you to be seen by your primary physician here in the coming 2 weeks to see how you are doing.  If your arm pain continues, I recommend a follow up appointment with the [facility] for further evaluation (to consider ultrasound evaluation to confirm diagnosis).   Bicipital Tendonitis Bicipital tendonitis refers to redness, soreness, and swelling (inflammation) or irritation of the bicep tendon. The biceps muscle is located between the elbow and shoulder of the inner arm. The tendon heads, similar to pieces of rope, connect the bicep muscle to the shoulder socket. They are called short head and long head tendons. When tendonitis occurs, the long head tendon is inflamed and swollen, and may be thickened or partially torn.  Bicipital tendonitis can occur with other problems as well, such as arthritis in the shoulder or acromioclavicular joints, tears in the tendons, or other rotator cuff problems.  CAUSES  Overuse of of the arms for overhead activities is the major cause of tendonitis. Many athletes, such as swimmers, baseball players, and tennis players are prone to bicipital tendonitis. Jobs that require manual labor or routine chores, especially chores involving overhead activities can result in overuse and tendonitis. SYMPTOMS Symptoms may include:  Pain in and around the front of the shoulder. Pain may be worse with overhead motion.  Pain or aching that radiates down the arm.  Clicking or shifting sensations in the shoulder. DIAGNOSIS Your caregiver may perform the following:  Physical exam and tests of the biceps and shoulder to observe range of motion, strength, and  stability.  X-rays or magnetic resonance imaging (MRI) to confirm the diagnosis. In most common cases, these tests are not necessary. Since other problems may exist in the shoulder or rotator cuff, additional tests may be recommended. TREATMENT Treatment may include the following:  Medications  Your caregiver may prescribe over-the-counter pain relievers.  Steroid injections, such as cortisone, may be recommended. These may help to reduce inflammation and pain.  Physical Therapy - Your caregiver may recommend gentle exercises with the arm. These can help restore strength and range of motion. They may be done at home or with a physical therapist's supervision and input.  Surgery - Arthroscopic or open surgery sometimes is necessary. Surgery may include:  Reattachment or repair of the tendon at the shoulder socket.  Removal of the damaged section of the tendon.  Anchoring the tendon to a different area of the shoulder (tenodesis). HOME CARE INSTRUCTIONS   Avoid overhead motion of the affected arm or any other motion that causes pain.  Take medication for pain as directed. Do not take these for more than 3 weeks, unless directed to do so by your caregiver.  Ice the affected area for 20 minutes at a time, 3-4 times per day. Place a towel on the skin over the painful area and the ice or cold pack over the towel. Do not place ice directly on the skin.  Perform gentle exercises at home as directed. These will increase strength and flexibility. PREVENTION  Modify your activities as much as possible to protect your arm. A physical therapist or sports medicine physician can help you understand options for safe motion.  Avoid  repetitive overhead pulling, lifting, reaching, and throwing until your caregiver tells you it is ok to resume these activities. SEEK MEDICAL CARE IF:  Your pain worsens.  You have difficulty moving the affected arm.  You have trouble performing any of the  self-care instructions. MAKE SURE YOU:   Understand these instructions.  Will watch your condition.  Will get help right away if you are not doing well or get worse. Document Released: 02/23/2010 Document Revised: 04/15/2011 Document Reviewed: 02/23/2010 [facility] Patient Information 2014 [place], [name]

## 2013-03-29 ENCOUNTER — Ambulatory Visit: Payer: Medicare Other | Admitting: Sports Medicine

## 2013-04-05 DIAGNOSIS — H348192 Central retinal vein occlusion, unspecified eye, stable: Secondary | ICD-10-CM | POA: Diagnosis not present

## 2013-04-05 DIAGNOSIS — H40059 Ocular hypertension, unspecified eye: Secondary | ICD-10-CM | POA: Diagnosis not present

## 2013-04-05 DIAGNOSIS — H2510 Age-related nuclear cataract, unspecified eye: Secondary | ICD-10-CM | POA: Diagnosis not present

## 2013-04-05 DIAGNOSIS — E119 Type 2 diabetes mellitus without complications: Secondary | ICD-10-CM | POA: Diagnosis not present

## 2013-04-08 ENCOUNTER — Other Ambulatory Visit (HOSPITAL_COMMUNITY): Payer: Self-pay | Admitting: Ophthalmology

## 2013-04-08 DIAGNOSIS — I878 Other specified disorders of veins: Secondary | ICD-10-CM

## 2013-04-08 DIAGNOSIS — I998 Other disorder of circulatory system: Secondary | ICD-10-CM

## 2013-04-08 DIAGNOSIS — H35369 Drusen (degenerative) of macula, unspecified eye: Secondary | ICD-10-CM | POA: Diagnosis not present

## 2013-04-08 DIAGNOSIS — H348192 Central retinal vein occlusion, unspecified eye, stable: Secondary | ICD-10-CM | POA: Diagnosis not present

## 2013-04-08 DIAGNOSIS — H2510 Age-related nuclear cataract, unspecified eye: Secondary | ICD-10-CM | POA: Diagnosis not present

## 2013-04-09 ENCOUNTER — Ambulatory Visit (HOSPITAL_COMMUNITY)
Admission: RE | Admit: 2013-04-09 | Discharge: 2013-04-09 | Disposition: A | Discharge status: Home / Self Care | Payer: Medicare Other | Source: Ambulatory Visit | Attending: Ophthalmology | Admitting: Ophthalmology

## 2013-04-09 DIAGNOSIS — I878 Other specified disorders of veins: Secondary | ICD-10-CM

## 2013-04-09 DIAGNOSIS — I999 Unspecified disorder of circulatory system: Secondary | ICD-10-CM | POA: Diagnosis not present

## 2013-04-09 DIAGNOSIS — I872 Venous insufficiency (chronic) (peripheral): Secondary | ICD-10-CM | POA: Insufficient documentation

## 2013-04-09 DIAGNOSIS — I998 Other disorder of circulatory system: Secondary | ICD-10-CM

## 2013-04-09 DIAGNOSIS — H5310 Unspecified subjective visual disturbances: Secondary | ICD-10-CM

## 2013-04-09 NOTE — Progress Notes (Signed)
VASCULAR LAB PRELIMINARY  PRELIMINARY  PRELIMINARY  PRELIMINARY  Carotid duplex completed.    Preliminary report:  Bilateral:  1-39% ICA stenosis lower end of range.  Vertebral artery flow is antegrade.     Amber [name], RVS 04/09/2013, 11:36 AM

## 2013-04-12 ENCOUNTER — Encounter: Payer: Self-pay | Admitting: Internal Medicine

## 2013-04-12 ENCOUNTER — Ambulatory Visit (INDEPENDENT_AMBULATORY_CARE_PROVIDER_SITE_OTHER): Payer: Medicare Other | Admitting: Sports Medicine

## 2013-04-12 ENCOUNTER — Encounter: Payer: Self-pay | Admitting: Sports Medicine

## 2013-04-12 VITALS — BP 146/69 | HR 59 | Temp 98.6°F | Ht 62.0 in | Wt 208.5 lb

## 2013-04-12 DIAGNOSIS — I10 Essential (primary) hypertension: Secondary | ICD-10-CM

## 2013-04-12 DIAGNOSIS — Z299 Encounter for prophylactic measures, unspecified: Secondary | ICD-10-CM | POA: Diagnosis not present

## 2013-04-12 DIAGNOSIS — M79609 Pain in unspecified limb: Secondary | ICD-10-CM | POA: Diagnosis not present

## 2013-04-12 DIAGNOSIS — Z23 Encounter for immunization: Secondary | ICD-10-CM | POA: Diagnosis not present

## 2013-04-12 DIAGNOSIS — E785 Hyperlipidemia, unspecified: Secondary | ICD-10-CM | POA: Diagnosis not present

## 2013-04-12 DIAGNOSIS — E119 Type 2 diabetes mellitus without complications: Secondary | ICD-10-CM

## 2013-04-12 DIAGNOSIS — M79601 Pain in right arm: Secondary | ICD-10-CM

## 2013-04-12 LAB — POCT GLYCOSYLATED HEMOGLOBIN (HGB A1C): HEMOGLOBIN A1C: 6.7

## 2013-04-12 MED ORDER — TIMOLOL HEMIHYDRATE 0.5 % OP SOLN: 1.0000 [drp] | Freq: Two times a day (BID) | OPHTHALMIC | Status: AC

## 2013-04-12 MED ORDER — METFORMIN HCL 1000 MG PO TABS: 1000.0000 mg | ORAL_TABLET | Freq: Two times a day (BID) | ORAL | Status: DC

## 2013-04-12 NOTE — Assessment & Plan Note (Signed)
Problem Based Documentation:    Subjective Report:   Reports good compliance  No longer taking NSAIDs     Assessment & Plan & Follow up Issues:  Chronic, adequately controlled condition 1. No changes.  At goal > Followup medication compliance and avoidance of NSAIDs

## 2013-04-12 NOTE — Assessment & Plan Note (Signed)
Problem Based Documentation:    Subjective Report:  Pt denies hypoglycemic symptoms/episodes.  No reported polyuria/polydipsia. Pt is compliant with foot exams and denies any new foot lesions or new sensory changes/dysesthesias.  Followed closely by ophthalmology     Assessment & Plan & Follow up Issues:  Chronic, marginally well controlled condition 1. Titrate metformin to 1000 mg twice a day 2. Encourage TLC (Therapeutic Lifestyle Changes) > Followup TLC

## 2013-04-12 NOTE — Progress Notes (Signed)
Amber [name] - 73 y.o. female MRN [id number]  Date of birth: 25-Mar-1940  SUBJECTIVE:  Pt is here today with a chief complaint of: Diabetes   She is here today for chronic disease follow up and persistent R arm pain.  Has been started on New Eye drops - Dr. Satira Sark - Timolol  Had Carotid duplex performed for right eye symptoms by ophthalmology that was negative.  Reports good compliance with diet and exercise.  Pt denies chest pain, dyspnea at rest or exertion, PND, lower extremity edema.  Patient denies any facial asymmetry, unilateral weakness, or dysarthria. For further subjective including (HPI, Interval History & ROS) please see problem based charting  HISTORY:  Recent Labs  05/01/12 1156 05/13/12 1447 09/30/12 1635 04/12/13 1353  HGBA1C  --  6.5 6.4 6.7  TSH 4.681*  --   --   --    Wt Readings from Last 3 Encounters:  04/12/13 208 lb 8 oz (94.575 kg)  12/15/12 203 lb 8 oz (92.307 kg)  12/07/12 205 lb (92.987 kg)   BP Readings from Last 3 Encounters:  04/12/13 146/69  03/12/13 169/73  12/15/12 168/75    History  Smoking status  . Never Smoker   Smokeless tobacco  . Former Systems developer  . Quit date: 02/05/1983   Health Maintenance Due  Topic  . Foot Exam   . Colonoscopy   . Zostavax   . Mammogram   . Urine Microalbumin   . Influenza Vaccine   . Hemoglobin A1c   . Ophthalmology Exam     }Otherwise past Medical, Surgical, Social, and Family History Reviewed per EMR Medications and Allergies reviewed and updated per below.  VITALS: BP 146/69  Pulse 59  Temp(Src) 98.6 F (37 C) (Oral)  Ht 5\' 2"  (1.575 m)  Wt 208 lb 8 oz (94.575 kg)  BMI 38.13 kg/m2  PHYSICAL EXAM: GENERAL: Adult african [place]  female. In no discomfort; no respiratory distress  PSYCH: alert and appropriate, good insight   HNEENT:  no JVD   CARDIO: RRR, S1/S2 heard, no murmur  LUNGS: CTA B, no wheezes, no crackles  ABDOMEN:   EXTREM:  Warm, well perfused.  Moves all 4  extremities spontaneously; no lateralization.  Distal pulses 1+/4.  no pretibial edema.  MSK: RUE Exam: Appear:  normal-appearing, no skin lesions   Palp:  significant pain with palpation of the  wrist Extensor compartment  ROM:  loss of terminal elbow flexion and extension   NV:  The bilateral upper extremity myotomes are 5+/5.  Sensation is grossly intact to light sensation in bilateral upper extremity dermatomes.  Testing:  pain with handshake, pain with wrist extension, worse with ulnar deviation.     SKIN:     MEDICATIONS, LABS & OTHER ORDERS: Previous Medications   ASPIRIN EC 81 MG TABLET    Take 81 mg by mouth daily.   GLUCOSE BLOOD (ACCU-CHEK INSTANT GLUCOSE TEST) TEST STRIP    Use as instructed. Test blood sugar twice daily   LOSARTAN-HYDROCHLOROTHIAZIDE (HYZAAR) 100-12.5 MG PER TABLET    Take 1 tablet by mouth daily.   METFORMIN (GLUCOPHAGE) 500 MG TABLET    Take 1 tablet (500 mg total) by mouth 2 (two) times daily with a meal.   METOPROLOL (LOPRESSOR) 50 MG TABLET    Take 1 tablet (50 mg total) by mouth 2 (two) times daily.   MOMETASONE (NASONEX) 50 MCG/ACT NASAL SPRAY    Place 2 sprays into the nose daily as needed. For allergy  symptoms   MULTIPLE VITAMINS-MINERALS (WOMENS ONE DAILY) TABS    Take 1 tablet by mouth daily.   POLYETHYL GLYCOL-PROPYL GLYCOL (SYSTANE OP)    Place 2 drops into both eyes 2 (two) times daily.    PRAVASTATIN (PRAVACHOL) 40 MG TABLET    Take 1 tablet (40 mg total) by mouth daily.   Modified Medications   No medications on file   New Prescriptions   TIMOLOL (BETIMOL) 0.5 % OPHTHALMIC SOLUTION    Place 1 drop into the right eye 2 (two) times daily.   Discontinued Medications   CYCLOBENZAPRINE (FLEXERIL) 5 MG TABLET    Take 1 tablet (5 mg total) by mouth 3 (three) times daily as needed for muscle spasms.   HYDROCODONE-ACETAMINOPHEN (NORCO/VICODIN) 5-325 MG PER TABLET    Take 1 tablet by mouth every 8 (eight) hours as needed for pain.   MELOXICAM  (MOBIC) 15 MG TABLET    Take 1 tablet (15 mg total) by mouth daily.   NAPROXEN 375 MG TBEC    Take 1 tablet (375 mg total) by mouth 2 (two) times daily between meals as needed.   Orders Placed This Encounter  Procedures  . POCT HgB A1C   ASSESSMENT & PLAN: See problem based charting & AVS for pt instructions.

## 2013-04-12 NOTE — Assessment & Plan Note (Signed)
Health maintenance updated. Influenza shot today. Fasting labs Screening colonoscopy

## 2013-04-12 NOTE — Assessment & Plan Note (Signed)
Problem Based Documentation:    Subjective Report:  Persistent right arm pain raging from the right elbow to the right dorsal wrist.  Some associated swelling and tenderness.  Loss of range of motion     Assessment & Plan & Follow up Issues:  Subacute condition Unclear etiology but appears to have a component of profound lateral epicondylitis.  She does have loss of terminal flexion and extension.  X-rays previously been negative the question potential occult fracture that should be well healed. 1. Trial physical therapy > consider repeat x-rays and/or advanced imaging  .

## 2013-04-12 NOTE — Assessment & Plan Note (Signed)
Return for fasting labs.

## 2013-04-12 NOTE — Patient Instructions (Signed)
Please increase your metformin to 1000 mg per day 2 tablets until you pick up your new prescription I am referring him to physical therapy for her right arm  Continue working on your diet and exercise Flu shot today I am referring to you for colonoscopy    If you need anything prior to your next visit please call the clinic. Please Bring all medications or accurate medication list with you to each appointment; an accurate medication list is essential in providing you the best care possible.

## 2013-04-16 ENCOUNTER — Other Ambulatory Visit: Payer: Self-pay | Admitting: *Deleted

## 2013-04-16 MED ORDER — PRAVASTATIN SODIUM 40 MG PO TABS: 40.0000 mg | ORAL_TABLET | Freq: Every day | ORAL | Status: DC

## 2013-04-22 ENCOUNTER — Ambulatory Visit: Payer: Medicare Other | Admitting: Physical Therapy

## 2013-04-27 ENCOUNTER — Telehealth: Payer: Self-pay | Admitting: Sports Medicine

## 2013-04-27 NOTE — Telephone Encounter (Signed)
Her right leg is burning and then it goes numb.  This happens in her thigh. This has been going on for a couple of week Please advise

## 2013-04-28 ENCOUNTER — Other Ambulatory Visit: Payer: Self-pay | Admitting: Family Medicine

## 2013-04-28 ENCOUNTER — Ambulatory Visit (AMBULATORY_SURGERY_CENTER): Payer: Self-pay

## 2013-04-28 VITALS — Ht 62.0 in | Wt 208.0 lb

## 2013-04-28 DIAGNOSIS — Z800 Family history of malignant neoplasm of digestive organs: Secondary | ICD-10-CM

## 2013-04-28 MED ORDER — MOVIPREP 100 G PO SOLR: 1.0000 | Freq: Once | ORAL | Status: DC

## 2013-04-28 NOTE — Telephone Encounter (Signed)
Unable to reach patient,no voicemail set up.Amber [name], Amber [name]

## 2013-04-28 NOTE — Progress Notes (Signed)
emmi not entered  No computer access for pt

## 2013-04-30 ENCOUNTER — Telehealth: Payer: Self-pay | Admitting: Sports Medicine

## 2013-04-30 NOTE — Telephone Encounter (Signed)
It was sent in on 3/25 to [facility]

## 2013-04-30 NOTE — Telephone Encounter (Signed)
Pt called and needs a refill on her BP and fluid pill combination called in ASAP. jw

## 2013-05-03 ENCOUNTER — Ambulatory Visit: Payer: Medicare Other | Attending: Sports Medicine | Admitting: Physical Therapy

## 2013-05-03 DIAGNOSIS — M25539 Pain in unspecified wrist: Secondary | ICD-10-CM | POA: Insufficient documentation

## 2013-05-03 DIAGNOSIS — M249 Joint derangement, unspecified: Secondary | ICD-10-CM | POA: Insufficient documentation

## 2013-05-03 DIAGNOSIS — M25569 Pain in unspecified knee: Secondary | ICD-10-CM | POA: Diagnosis not present

## 2013-05-03 DIAGNOSIS — IMO0001 Reserved for inherently not codable concepts without codable children: Secondary | ICD-10-CM | POA: Diagnosis not present

## 2013-05-03 DIAGNOSIS — R5381 Other malaise: Secondary | ICD-10-CM | POA: Diagnosis not present

## 2013-05-03 DIAGNOSIS — M6281 Muscle weakness (generalized): Secondary | ICD-10-CM | POA: Diagnosis not present

## 2013-05-04 ENCOUNTER — Ambulatory Visit: Payer: Medicare Other | Admitting: Physical Therapy

## 2013-05-04 DIAGNOSIS — M25569 Pain in unspecified knee: Secondary | ICD-10-CM | POA: Diagnosis not present

## 2013-05-04 DIAGNOSIS — R5381 Other malaise: Secondary | ICD-10-CM | POA: Diagnosis not present

## 2013-05-04 DIAGNOSIS — IMO0001 Reserved for inherently not codable concepts without codable children: Secondary | ICD-10-CM | POA: Diagnosis not present

## 2013-05-04 DIAGNOSIS — M249 Joint derangement, unspecified: Secondary | ICD-10-CM | POA: Diagnosis not present

## 2013-05-04 DIAGNOSIS — M25539 Pain in unspecified wrist: Secondary | ICD-10-CM | POA: Diagnosis not present

## 2013-05-04 DIAGNOSIS — M6281 Muscle weakness (generalized): Secondary | ICD-10-CM | POA: Diagnosis not present

## 2013-05-06 DIAGNOSIS — H348192 Central retinal vein occlusion, unspecified eye, stable: Secondary | ICD-10-CM | POA: Diagnosis not present

## 2013-05-11 ENCOUNTER — Ambulatory Visit: Payer: Medicare Other | Admitting: Physical Therapy

## 2013-05-12 ENCOUNTER — Encounter: Payer: Medicare Other | Admitting: Internal Medicine

## 2013-05-13 ENCOUNTER — Encounter: Payer: Medicare Other | Admitting: Physical Therapy

## 2013-05-17 ENCOUNTER — Encounter: Payer: Medicare Other | Admitting: Physical Therapy

## 2013-05-20 ENCOUNTER — Encounter: Payer: Medicare Other | Admitting: Physical Therapy

## 2013-05-25 ENCOUNTER — Encounter: Payer: Self-pay | Admitting: Family Medicine

## 2013-05-25 ENCOUNTER — Ambulatory Visit (INDEPENDENT_AMBULATORY_CARE_PROVIDER_SITE_OTHER): Payer: Medicare Other | Admitting: Family Medicine

## 2013-05-25 VITALS — BP 154/70 | HR 75 | Temp 98.0°F | Ht 62.0 in | Wt 204.0 lb

## 2013-05-25 DIAGNOSIS — M549 Dorsalgia, unspecified: Secondary | ICD-10-CM

## 2013-05-25 NOTE — Assessment & Plan Note (Signed)
Likely a muscle strain associated with this coughing. Physical exam is reassuring, with no paresthesia and normal muscle strength. Discussed with her that her back pain is mostly associated with her coughing. - Naproxen/Aleve BID for 2 weeks  - increased Nasonex 3 puffs BID for 2 weeks and then back to 2 puffs BID  - netti pot or nasal saline spray as much as needed  - given instructions for heat and ice - if not improved in 3 weeks in followup with myself or Dr. Paulla Fore - Discussed with Dr. Ree Kida

## 2013-05-25 NOTE — Progress Notes (Signed)
 Subjective:    Patient ID: Amber [name], female    DOB: 03/31/1940, 72 y.o.   MRN: [id number]  HPI Amber [name] is here for back pain.   BACK PAIN  Location: right flank Quality: pinching in nature Onset: 1 week ago Worse with: coughing   Better with: not alleviated with tyelnol  Radiation: none Best sitting/standing/leaning forward: improved when leaning forward  Mechanical: (Strain, herniated disc, Osteoporosis, spinal stenosis, fractures, scoliosis, kyphosis, lordosis)  Trauma: none Motor Deficit: none Steroid use: no Age: >50 years of age yes  Non mechanical: (Neoplastic, Infection, Inflammatory, Paget's)  Red Flags:  Fecal/urinary incontinence: no  Numbness/Weakness: no  Fever/chills/sweats: yes, sweats but this is not new. She has hot flashes. Night pain: no  Unexplained weight loss: no  No relief with bedrest: yes, some  h/o cancer/immunosuppression: no cancer personally, but sister had pancreatic cancer and mother had cancer. She has diabetes.    IV drug use: no  PMH of osteoporosis or chronic steroid use: no   The pain is about the same. The pain started after she was coughing. She started coughing 4 days ago. She has used Halls cough drops and they have helped a little bit. She has a history of allergies but this feels this is different because it is not improving with her current medications. She uses her nasonex everyday, 2 sprays in each notstril. She uses allegra as an anti-histamine. No sick contacts. She coughs at night and during the day.   Current Outpatient Prescriptions on File Prior to Visit  Medication Sig Dispense Refill  . aspirin EC 81 MG tablet Take 81 mg by mouth daily.      . glucose blood (ACCU-CHEK INSTANT GLUCOSE TEST) test strip Use as instructed. Test blood sugar twice daily  100 each  0  . losartan-hydrochlorothiazide (HYZAAR) 100-12.5 MG per tablet TAKE 1 TABLET BYMOUTH ONCE DAILY  90 tablet  3  . metFORMIN (GLUCOPHAGE) 1000 MG  tablet Take 1 tablet (1,000 mg total) by mouth 2 (two) times daily with a meal.  60 tablet  5  . metoprolol (LOPRESSOR) 50 MG tablet take 1 tablet by mouth twice a day  180 tablet  3  . mometasone (NASONEX) 50 MCG/ACT nasal spray Place 2 sprays into the nose daily as needed. For allergy symptoms  17 g  5  . Multiple Vitamins-Minerals (WOMENS ONE DAILY) TABS Take 1 tablet by mouth daily.      . Polyethyl Glycol-Propyl Glycol (SYSTANE OP) Place 2 drops into both eyes 2 (two) times daily.       . pravastatin (PRAVACHOL) 40 MG tablet Take 1 tablet (40 mg total) by mouth daily.  30 tablet  2  . timolol (BETIMOL) 0.5 % ophthalmic solution Place 1 drop into the right eye 2 (two) times daily.  10 mL  12  . MOVIPREP 100 G SOLR Take 1 kit (200 g total) by mouth once.  1 kit  0   No current facility-administered medications on file prior to visit.    Review of Systems See HPI     Objective:   Physical Exam BP 154/70  Pulse 75  Temp(Src) 98 F (36.7 C) (Oral)  Ht 5' 2" (1.575 m)  Wt 204 lb (92.534 kg)  BMI 37.30 kg/m2  SpO2 95% Gen: NAD, alert, cooperative with exam, well-appearing, African American female, obese, no coughing during exam HEENT: NCAT, PERRL, clear conjunctiva,  MSK: 5 out of 5 strength in the lower extremities bilaterally,   normal sensation in the lower extremities bilaterally, +2 knee reflexes bilaterally, +2 pulses in the lower extremities bilaterally, normal gait Back: full range of motion on extension and flexion, able to side bend bilaterally, pain exacerbated upon twisting., Point tenderness on the right flank in the thoracic region below the scapula, no paraspinal tenderness, no CVA tenderness, no spinal tenderness Neuro: no gross deficits.       Assessment & Plan:   

## 2013-05-25 NOTE — Patient Instructions (Signed)
Thank you for coming in,   You may increase the nasonex to three puffs in each nostril twice a day for two weeks. Also try uses a nasal saline spray or Netti pot to spray up your nose to rinse them as many times as you wish.   You may heat and ice the area. Alternate 20 minutes on and off.   Try taking aleve or naproxen two times a day for two weeks. If by the third week you are still having pain then follow up with myself or Dr. Paulla Fore. Your pain should improve but it may take a few weeks.    Please feel free to call with any questions or concerns at any time, at [phone]. --Dr. Raeford Razor  Allergic Rhinitis Allergic rhinitis is when the mucous membranes in the nose respond to allergens. Allergens are particles in the air that cause your body to have an allergic reaction. This causes you to release allergic antibodies. Through a chain of events, these eventually cause you to release histamine into the blood stream. Although meant to protect the body, it is this release of histamine that causes your discomfort, such as frequent sneezing, congestion, and an itchy, runny nose.  CAUSES  Seasonal allergic rhinitis (hay fever) is caused by pollen allergens that may come from grasses, trees, and weeds. Year-round allergic rhinitis (perennial allergic rhinitis) is caused by allergens such as house dust mites, pet dander, and mold spores.  SYMPTOMS   Nasal stuffiness (congestion).  Itchy, runny nose with sneezing and tearing of the eyes. DIAGNOSIS  Your health care provider can help you determine the allergen or allergens that trigger your symptoms. If you and your health care provider are unable to determine the allergen, skin or blood testing may be used. TREATMENT  Allergic Rhinitis does not have a cure, but it can be controlled by:  Medicines and allergy shots (immunotherapy).  Avoiding the allergen. Hay fever may often be treated with antihistamines in pill or nasal spray forms. Antihistamines  block the effects of histamine. There are over-the-counter medicines that may help with nasal congestion and swelling around the eyes. Check with your health care provider before taking or giving this medicine.  If avoiding the allergen or the medicine prescribed do not work, there are many new medicines your health care provider can prescribe. Stronger medicine may be used if initial measures are ineffective. Desensitizing injections can be used if medicine and avoidance does not work. Desensitization is when a patient is given ongoing shots until the body becomes less sensitive to the allergen. Make sure you follow up with your health care provider if problems continue. HOME CARE INSTRUCTIONS It is not possible to completely avoid allergens, but you can reduce your symptoms by taking steps to limit your exposure to them. It helps to know exactly what you are allergic to so that you can avoid your specific triggers. SEEK MEDICAL CARE IF:   You have a fever.  You develop a cough that does not stop easily (persistent).  You have shortness of breath.  You start wheezing.  Symptoms interfere with normal daily activities. Document Released: 10/16/2000 Document Revised: 11/11/2012 Document Reviewed: 09/28/2012 [facility] Patient Information 2014 [place].

## 2013-06-03 DIAGNOSIS — H348192 Central retinal vein occlusion, unspecified eye, stable: Secondary | ICD-10-CM | POA: Diagnosis not present

## 2013-06-14 ENCOUNTER — Telehealth: Payer: Self-pay | Admitting: Internal Medicine

## 2013-06-14 ENCOUNTER — Encounter: Payer: Medicare Other | Admitting: Internal Medicine

## 2013-06-14 NOTE — Telephone Encounter (Signed)
Yes charge 

## 2013-06-15 DIAGNOSIS — H40059 Ocular hypertension, unspecified eye: Secondary | ICD-10-CM | POA: Diagnosis not present

## 2013-07-02 ENCOUNTER — Telehealth: Payer: Self-pay | Admitting: Sports Medicine

## 2013-07-02 NOTE — Telephone Encounter (Signed)
Pt stated she has burning sensation of her right leg that occurs varies times of the day.  Pt stated she would rub alcohol to relieve the burning.  Pt denies hitting or bumping leg on anything, fever or swelling.  Appt scheduled for 07/06/2013 with PCP.  Pt advised if burning sensation increases, fever, pain or swelling of leg to go to urgent care or ED.  Pt stated understanding.   Derl Barrow, RN

## 2013-07-02 NOTE — Telephone Encounter (Signed)
Pt would like the nurse to call her concerning the burning and pain in her right leg. jw

## 2013-07-05 NOTE — Telephone Encounter (Signed)
Okay to see see on 6/2

## 2013-07-06 ENCOUNTER — Encounter: Payer: Self-pay | Admitting: Sports Medicine

## 2013-07-06 ENCOUNTER — Ambulatory Visit (INDEPENDENT_AMBULATORY_CARE_PROVIDER_SITE_OTHER): Payer: Medicare Other | Admitting: Sports Medicine

## 2013-07-06 VITALS — BP 145/79 | HR 59 | Temp 98.2°F | Ht 62.0 in | Wt 203.0 lb

## 2013-07-06 DIAGNOSIS — I10 Essential (primary) hypertension: Secondary | ICD-10-CM

## 2013-07-06 DIAGNOSIS — M76899 Other specified enthesopathies of unspecified lower limb, excluding foot: Secondary | ICD-10-CM | POA: Diagnosis not present

## 2013-07-06 DIAGNOSIS — E119 Type 2 diabetes mellitus without complications: Secondary | ICD-10-CM | POA: Diagnosis not present

## 2013-07-06 DIAGNOSIS — M7061 Trochanteric bursitis, right hip: Secondary | ICD-10-CM | POA: Insufficient documentation

## 2013-07-06 LAB — POCT GLYCOSYLATED HEMOGLOBIN (HGB A1C): Hemoglobin A1C: 6.5

## 2013-07-06 MED ORDER — METHYLPREDNISOLONE ACETATE 40 MG/ML IJ SUSP
40.0000 mg | Freq: Once | INTRAMUSCULAR | Status: AC
  Administered 2013-07-06: 40 mg via INTRA_ARTICULAR

## 2013-07-06 NOTE — Patient Instructions (Signed)
Please try using ice on your hip.  Care After Refer to this sheet in the next few days. These instructions provide you with information on caring for yourself after you have had a joint injection. Your caregiver also may give you more specific instructions. Your treatment has been planned according to current medical practices, but problems sometimes occur. Call your caregiver if you have any problems or questions after your procedure. After any type of joint injection, it is not uncommon to experience:  Soreness, swelling, or bruising around the injection site.  Mild numbness, tingling, or weakness around the injection site caused by the numbing medicine used before or with the injection. It also is possible to experience the following effects associated with the specific agent after injection:  Iodine-based contrast agents:  Allergic reaction (itching, hives, widespread redness, and swelling beyond the injection site).  Corticosteroids (These effects are rare.):  Allergic reaction.  Increased blood sugar levels (If you have diabetes and you notice that your blood sugar levels have increased, notify your caregiver).  Increased blood pressure levels.  Mood swings.  Hyaluronic acid in the use of viscosupplementation.  Temporary heat or redness.  Temporary rash and itching.  Increased fluid accumulation in the injected joint. These effects all should resolve within a day after your procedure.  HOME CARE INSTRUCTIONS  Limit yourself to light activity the day of your procedure. Avoid lifting heavy objects, bending, stooping, or twisting.  Take prescription or over-the-counter pain medication as directed by your caregiver.  You may apply ice to your injection site to reduce pain and swelling the day of your procedure. Ice may be applied 03-04 times:  Put ice in a plastic bag.  Place a towel between your skin and the bag.  Leave the ice on for no longer than 15-20 minutes each  time. SEEK IMMEDIATE MEDICAL CARE IF:   Pain and swelling get worse rather than better or extend beyond the injection site.  Numbness does not go away.  Blood or fluid continues to leak from the injection site.  You have chest pain.  You have swelling of your face or tongue.  You have trouble breathing or you become dizzy.  You develop a fever, chills, or severe tenderness at the injection site that last longer than 1 day. MAKE SURE YOU:  Understand these instructions.  Watch your condition.  Get help right away if you are not doing well or if you get worse. Document Released: 10/04/2010 Document Revised: 04/15/2011 Document Reviewed: 10/04/2010 [facility] Patient Information 2014 [place].

## 2013-07-06 NOTE — Assessment & Plan Note (Signed)
A1c obtained today at goal. Discussed effects or concerns we'll have on her sugars.  Okay to stop home glucose monitoring given on no hypoglycemic medications.

## 2013-07-06 NOTE — Progress Notes (Signed)
  Amber [name] - 72 y.o. female MRN [id number]  Date of birth: 27-Feb-1940  CC, SUBJECTIVE & ROS:     If applicable, see problem based charting for additional problem specific documentation. Chief Complaint  Patient presents with  . Leg Pain    left   The patient has 2 weeks of worsening right lateral thigh pain that radiates from her greater trochanter to the knee.  Is worse at night.  She has increased her walking activity recently.  She has had no weakness or reported falls due to this.  No specific knee or back symptoms.  Pt denies any radicular symptoms, change in bowel or bladder habits, muscle weakness or falls associated with back pain.  No fevers, chills, night sweats or weight loss.  She has not tried any medications  At the end of the visit, patient did report that she is occasionally having mild tachycardia palpitations that have been going on for years but has not brought to our attention.  She's been seen by cardiology for this before in the past but she believes these may be occurring more often than in the past.   HISTORY: Past Medical, Surgical, Social, and Family History Reviewed & Updated per EMR.  Pertinent Historical Findings include: Diabetes, hypertension, multifocal muscular skeletal complaints.  Contact dermatitis, proteinuria.  She was recently scheduled for a screening colonoscopy but no showed for this appointment. Non-smoker  OBJECTIVE:  VS: BP:145/79 mmHg  HR:59bpm  TEMP:98.2 F (36.8 C)(Oral)  RESP:   HT:5\' 2"  (157.5 cm)   WT:203 lb (92.08 kg)  BMI:37.2 PHYSICAL EXAM:  GENERAL: Adult African American obese, Elderly  female. In no discomfort; no respiratory distress  PSYCH: alert and appropriate, good insight   R Hip Exam: Appear:  normal-appearing   Palp:  significant tenderness to palpation over the right greater trochanter; less tender over TFL., ITB but still impressive..    ROM:  full hip range of motion   NV:   The bilateral Lower Extremity  myotomes are 5+/5.  Sensation is grossly intact to light sensation in bilateral Lower Extremity dermatomes.  Testing:  lateral compression test positive, FABER neg; FADIR neg    ASSESSMENT & PLAN: See problem based charting & AVS for pt instructions.  >50% of this 25 minute visit spent in direct patient counseling and/or coordination of care.

## 2013-07-06 NOTE — Assessment & Plan Note (Signed)
Chronic condition  - slightly elevated today above goal but in pain.   1. Patient to return in 2 weeks to discuss ongoing antihypertensive regimen and to discuss further tachypalpitations > Needs EKG and further discussion at followup.  Red flags for patient be evaluated emergently reviewed today.

## 2013-07-06 NOTE — Assessment & Plan Note (Signed)
Acute condition  - likely hip abduction weakness contributing to this with increased walking.  Discussed likely effect of CSI diabetes but has been well controlled. 1. Corticosteroid injection today. 2. Home strengthening regimen. > Consider referral to physical therapy for formal home regimen.     Right Greater Trochanteric Injection Performed today:  The risks, benefits, and expected outcomes of the injection were reviewed and she wishes to undergo the above named procedure.  After an appropriate time out was taken, the R Hip was prepped in a clean fashion and injected from a Driect approach with a 25g 3.5inch syringe using 4cc of 1% plain Lidocaine and 40mg  of Depomedrol. .  A bandaid was applied to the area.  This procedure was well tolerated and there were no complications.

## 2013-07-13 DIAGNOSIS — H348192 Central retinal vein occlusion, unspecified eye, stable: Secondary | ICD-10-CM | POA: Diagnosis not present

## 2013-07-19 NOTE — Telephone Encounter (Signed)
Unable to bill pt Colon Cx fee due to her insurance./yf

## 2013-07-20 ENCOUNTER — Ambulatory Visit: Payer: Medicare Other | Admitting: Sports Medicine

## 2013-07-29 ENCOUNTER — Encounter: Payer: Self-pay | Admitting: Sports Medicine

## 2013-07-29 ENCOUNTER — Ambulatory Visit (INDEPENDENT_AMBULATORY_CARE_PROVIDER_SITE_OTHER): Payer: Medicare Other | Admitting: Sports Medicine

## 2013-07-29 ENCOUNTER — Ambulatory Visit (HOSPITAL_COMMUNITY)
Admission: RE | Admit: 2013-07-29 | Discharge: 2013-07-29 | Disposition: A | Discharge status: Home / Self Care | Payer: Medicare Other | Source: Ambulatory Visit | Attending: Sports Medicine | Admitting: Sports Medicine

## 2013-07-29 VITALS — BP 142/83 | HR 67 | Temp 98.1°F | Wt 195.0 lb

## 2013-07-29 DIAGNOSIS — R42 Dizziness and giddiness: Secondary | ICD-10-CM

## 2013-07-29 DIAGNOSIS — I10 Essential (primary) hypertension: Secondary | ICD-10-CM | POA: Diagnosis not present

## 2013-07-29 DIAGNOSIS — E119 Type 2 diabetes mellitus without complications: Secondary | ICD-10-CM | POA: Diagnosis not present

## 2013-07-29 DIAGNOSIS — I498 Other specified cardiac arrhythmias: Secondary | ICD-10-CM | POA: Insufficient documentation

## 2013-07-29 DIAGNOSIS — R002 Palpitations: Secondary | ICD-10-CM | POA: Diagnosis not present

## 2013-07-29 DIAGNOSIS — R5381 Other malaise: Secondary | ICD-10-CM | POA: Diagnosis not present

## 2013-07-29 DIAGNOSIS — Z299 Encounter for prophylactic measures, unspecified: Secondary | ICD-10-CM

## 2013-07-29 DIAGNOSIS — R5383 Other fatigue: Secondary | ICD-10-CM

## 2013-07-29 LAB — COMPREHENSIVE METABOLIC PANEL
ALT: 10 U/L (ref 0–35)
AST: 5 U/L (ref 0–37)
Albumin: 4.1 g/dL (ref 3.5–5.2)
Alkaline Phosphatase: 55 U/L (ref 39–117)
BUN: 21 mg/dL (ref 6–23)
CALCIUM: 9.6 mg/dL (ref 8.4–10.5)
CHLORIDE: 104 meq/L (ref 96–112)
CO2: 29 meq/L (ref 19–32)
CREATININE: 1 mg/dL (ref 0.50–1.10)
GLUCOSE: 84 mg/dL (ref 70–99)
Potassium: 4 mEq/L (ref 3.5–5.3)
Sodium: 143 mEq/L (ref 135–145)
Total Bilirubin: 0.4 mg/dL (ref 0.2–1.2)
Total Protein: 7.6 g/dL (ref 6.0–8.3)

## 2013-07-29 LAB — CBC
HEMATOCRIT: 33.5 % — AB (ref 36.0–46.0)
Hemoglobin: 11 g/dL — ABNORMAL LOW (ref 12.0–15.0)
MCH: 24.8 pg — AB (ref 26.0–34.0)
MCHC: 32.8 g/dL (ref 30.0–36.0)
MCV: 75.6 fL — AB (ref 78.0–100.0)
Platelets: 325 10*3/uL (ref 150–400)
RBC: 4.43 MIL/uL (ref 3.87–5.11)
RDW: 15 % (ref 11.5–15.5)
WBC: 8.9 10*3/uL (ref 4.0–10.5)

## 2013-07-29 MED ORDER — PRAVASTATIN SODIUM 40 MG PO TABS: 40.0000 mg | ORAL_TABLET | Freq: Every day | ORAL | Status: DC

## 2013-07-29 NOTE — Progress Notes (Signed)
  Meher Kucinski Bloch - 73 y.o. female MRN [id number]  Date of birth: 08/29/1940  SUBJECTIVE:  Including CC & ROS.  Chief Complaint  Patient presents with  . Follow-up    right leg is doing better  . Loss of appetite   chronic disease management (diabetes, hypertension): Reports overall good compliance with medications.Pt denies chest pain, dyspnea at rest or exertion, PND, lower extremity edema. Patient denies any facial asymmetry, unilateral weakness, or dysarthria.  Needs labs checked  Palpitations: This reports occasional 5 minute duration tachypalpitations with occasional dizziness associated with this symptom.  Otherwise see above  Loss of appetite: This reports an insidious loss of appetite over the past 2 months.  She's had some associated weight loss.  Denies melena, denies hematochezia.  She is an obese seen for her colonoscopy due to insurance concerns.  Reports overall her right leg pain from greater trochanteric bursitis has resolved.   HISTORY: Past Medical, Surgical, Social, and Family History Reviewed & Updated per EMR. Pertinent Historical Findings include: Prior smoker quit greater than 20 years ago.  DATA REVIEWED:  Recent Labs  09/30/12 1635 04/12/13 1353 07/06/13 1541  HGBA1C 6.4 6.7 6.5   BP Readings from Last 5 Encounters:  07/29/13 142/83  07/06/13 145/79  05/25/13 154/70  04/12/13 146/69  03/12/13 169/73   Wt Readings from Last 5 Encounters:  07/29/13 195 lb (88.451 kg)  07/06/13 203 lb (92.08 kg)  05/25/13 204 lb (92.534 kg)  04/28/13 208 lb (94.348 kg)  04/12/13 208 lb 8 oz (94.575 kg)     PHYSICAL EXAM:  VS: BP:142/83 mmHg  HR:67bpm  TEMP:98.1 F (36.7 C)(Oral)  RESP:   HT:    WT:195 lb (88.451 kg)  BMI:  PHYSICAL EXAM: GENERAL:  Adult African American female. In no discomfort; no respiratory distress  PSYCH:  alert and appropriate, good insight  HNEENT:  mmm, no JVD CARDIAC:  RRR, S1/S2 heard, no murmur LUNGS:  CTA B, no wheezes, no  crackles ABDOMEN:  +BS, soft, non-tender, no rigidity, no guarding, no masses/hepatosplenomegaly EXTREM:  Warm, well perfused.  Moves all 4 extremities spontaneously; no lateralization.  Pedal pulses 1+/4.  No pretibial edema.  There are no foot lesions.  Monofilament testing is intact throughout bilateral lower extremities    ASSESSMENT & PLAN: See problem based charting & AVS for pt instructions.

## 2013-07-29 NOTE — Patient Instructions (Signed)
I am glad you came in today. We are checking labs today.  Please bring back the stool cards.  I also recommend you try to followup with gastroenterology and your insurance to try to get a colonoscopy  I'm also referring you to cardiology to evaluate your palpitations further.

## 2013-07-30 LAB — VITAMIN D 25 HYDROXY (VIT D DEFICIENCY, FRACTURES): Vit D, 25-Hydroxy: 53 ng/mL (ref 30–89)

## 2013-07-30 LAB — TSH: TSH: 2.158 u[IU]/mL (ref 0.350–4.500)

## 2013-07-30 NOTE — Assessment & Plan Note (Signed)
Intermittent condition  - unclear etiology as to what this is.  She does have mild iron deficiency anemia but given the duration of symptoms and risk factors cardiology evaluation warranted 1. Referral to cardiology

## 2013-07-30 NOTE — Assessment & Plan Note (Addendum)
Chronic well controlled condition  - no changes to medicines.  Exam performed today. 1. Continue TLC (Therapeutic Lifestyle Changes) 2. Followup labs

## 2013-07-30 NOTE — Assessment & Plan Note (Signed)
Stool cards given today. Needs colonoscopy especially in setting of iron deficiency anemia.

## 2013-08-03 ENCOUNTER — Encounter: Payer: Self-pay | Admitting: Sports Medicine

## 2013-09-01 ENCOUNTER — Encounter: Payer: Self-pay | Admitting: Cardiovascular Disease

## 2013-09-01 ENCOUNTER — Ambulatory Visit (INDEPENDENT_AMBULATORY_CARE_PROVIDER_SITE_OTHER): Payer: Medicare Other | Admitting: Cardiovascular Disease

## 2013-09-01 VITALS — BP 114/86 | HR 57 | Ht 62.0 in | Wt 200.0 lb

## 2013-09-01 DIAGNOSIS — Z79899 Other long term (current) drug therapy: Secondary | ICD-10-CM

## 2013-09-01 DIAGNOSIS — R002 Palpitations: Secondary | ICD-10-CM

## 2013-09-01 DIAGNOSIS — I10 Essential (primary) hypertension: Secondary | ICD-10-CM | POA: Diagnosis not present

## 2013-09-01 DIAGNOSIS — E785 Hyperlipidemia, unspecified: Secondary | ICD-10-CM | POA: Diagnosis not present

## 2013-09-01 LAB — HEPATIC FUNCTION PANEL
ALBUMIN: 4.1 g/dL (ref 3.5–5.2)
ALT: 13 U/L (ref 0–35)
AST: 10 U/L (ref 0–37)
Alkaline Phosphatase: 50 U/L (ref 39–117)
BILIRUBIN TOTAL: 0.4 mg/dL (ref 0.2–1.2)
Bilirubin, Direct: 0.1 mg/dL (ref 0.0–0.3)
Indirect Bilirubin: 0.3 mg/dL (ref 0.2–1.2)
Total Protein: 7.6 g/dL (ref 6.0–8.3)

## 2013-09-01 LAB — LIPID PANEL
CHOL/HDL RATIO: 3.5 ratio
Cholesterol: 173 mg/dL (ref 0–200)
HDL: 50 mg/dL (ref >39)
LDL CALC: 100 mg/dL — AB (ref 0–99)
Triglycerides: 115 mg/dL (ref <150)
VLDL: 23 mg/dL (ref 0–40)

## 2013-09-01 NOTE — Patient Instructions (Signed)
Your physician has requested that you have an echocardiogram. Echocardiography is a painless test that uses sound waves to create images of your heart. It provides your doctor with information about the size and shape of your heart and how well your heart's chambers and valves are working. This procedure takes approximately one hour. There are no restrictions for this procedure.  Your physician has recommended that you wear an event monitor. Event monitors are medical devices that record the heart's electrical activity. Doctors most often [name] these monitors to diagnose arrhythmias. Arrhythmias are problems with the speed or rhythm of the heartbeat. The monitor is a small, portable device. You can wear one while you do your normal daily activities. This is usually used to diagnose what is causing palpitations/syncope (passing out).  Your physician recommends that you schedule a follow-up appointment in: 6-8 weeks with Dr.Berry

## 2013-09-01 NOTE — Assessment & Plan Note (Signed)
Controlled on current medications 

## 2013-09-01 NOTE — Assessment & Plan Note (Signed)
History palpitations for the last year on and off currently appearing every other day lasting for seconds at a time. Diet function tests were recently checked and were normal. There are no other associated symptoms. I'm going to check a 2-D echocardiogram and a one-month event monitor and we'll see her back after that for further evaluation.

## 2013-09-01 NOTE — Assessment & Plan Note (Signed)
On statin therapy, followed by her PCP. Her most recent lipid profile performed in the chart it says 20/12 revealed her not to be at goal.. We will recheck a lipid and liver profile

## 2013-09-01 NOTE — Progress Notes (Signed)
09/01/2013 Amber [name]   1940-08-24  [id number]  Primary Physician Janora Norlander, DO Primary Cardiologist: Lorretta Harp MD Renae Gloss   HPI:  Amber [name] is a 73 year old moderately overweight widowed African American female mother of 5 children, grandmother and 43 grandchildren, referred by her primary care physician, Dr. Paulla Fore at [facility] for evaluation of palpitations. She is retired from working in [employer] for 20 years. Her cardiovascular risk factor profile is notable for treated hypertension, hyperlipidemia and diabetes. For children, both sons, and had stents in the past. She has never had a heart attack or stroke, denies chest pain or shortness of breath. She said "fluttering in her chest" for last year off and on occurring every other day and lasting seconds at a time. They often cause her to cough. She did denies other associated symptoms. Recent TSH was measured in the normal range. She drinks one cup of caffeinated coffee a day.   Current Outpatient Prescriptions  Medication Sig Dispense Refill  . aspirin EC 81 MG tablet Take 81 mg by mouth daily.      Marland Kitchen glucose blood (ACCU-CHEK INSTANT GLUCOSE TEST) test strip Use as instructed. Test blood sugar twice daily  100 each  0  . losartan-hydrochlorothiazide (HYZAAR) 100-12.5 MG per tablet TAKE 1 TABLET BYMOUTH ONCE DAILY  90 tablet  3  . metFORMIN (GLUCOPHAGE) 1000 MG tablet Take 1 tablet (1,000 mg total) by mouth 2 (two) times daily with a meal.  60 tablet  5  . metoprolol (LOPRESSOR) 50 MG tablet take 1 tablet by mouth twice a day  180 tablet  3  . mometasone (NASONEX) 50 MCG/ACT nasal spray Place 2 sprays into the nose daily as needed. For allergy symptoms  17 g  5  . MOVIPREP 100 G SOLR Take 1 kit (200 g total) by mouth once.  1 kit  0  . Multiple Vitamins-Minerals (WOMENS ONE DAILY) TABS Take 1 tablet by mouth daily.      Vladimir Faster Glycol-Propyl Glycol (SYSTANE  OP) Place 2 drops into both eyes 2 (two) times daily.       . pravastatin (PRAVACHOL) 40 MG tablet Take 1 tablet (40 mg total) by mouth daily.  30 tablet  5  . timolol (BETIMOL) 0.5 % ophthalmic solution Place 1 drop into the right eye 2 (two) times daily.  10 mL  12   No current facility-administered medications for this visit.    Allergies  Allergen Reactions  . Amoxicillin     REACTION: arrythmia  . Codeine Other (See Comments)    Dry mouth  . Penicillins Hives  . Sulfamethoxazole Hives and Nausea And Vomiting    REACTION: urticaria (hives) & weakness  . Sulfonamide Derivatives Hives and Nausea And Vomiting    Weakness    History   Social History  . Marital Status: Widowed    Spouse Name: N/A    Number of Children: N/A  . Years of Education: N/A   Occupational History  . Not on file.   Social History Main Topics  . Smoking status: Never Smoker   . Smokeless tobacco: Former Systems developer    Quit date: 02/05/1983  . Alcohol Use: No  . Drug Use: No  . Sexual Activity: Not on file   Other Topics Concern  . 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  5 children, lives alone.   Pt exercises by walking every other day.           Review of Systems: General: negative for chills, fever, night sweats or weight changes.  Cardiovascular: negative for chest pain, dyspnea on exertion, edema, orthopnea, palpitations, paroxysmal nocturnal dyspnea or shortness of breath Dermatological: negative for rash Respiratory: negative for cough or wheezing Urologic: negative for hematuria Abdominal: negative for nausea, vomiting, diarrhea, bright red blood per rectum, melena, or hematemesis Neurologic: negative for visual changes, syncope, or dizziness All other systems reviewed and are otherwise negative except as noted above.    Blood pressure 114/86, pulse 57, height '5\' 2"'  (1.575 m),  weight 200 lb (90.719 kg).  General appearance: alert and no distress Neck: no adenopathy, no carotid bruit, no JVD, supple, symmetrical, trachea midline and thyroid not enlarged, symmetric, no tenderness/mass/nodules Lungs: clear to auscultation bilaterally Heart: regular rate and rhythm, S1, S2 normal, no murmur, click, rub or gallop Extremities: extremities normal, atraumatic, no cyanosis or edema and 2+ pedal pulses bilaterally  EKG sinus bradycardia at 57 with nonspecific ST and T-wave changes  ASSESSMENT AND PLAN:   HYPERTENSION, BENIGN SYSTEMIC Controlled on current medications  HYPERLIPIDEMIA On statin therapy, followed by her PCP. Her most recent lipid profile performed in the chart it says 20/12 revealed her not to be at goal.. We will recheck a lipid and liver profile  Palpitations History palpitations for the last year on and off currently appearing every other day lasting for seconds at a time. Diet function tests were recently checked and were normal. There are no other associated symptoms. I'm going to check a 2-D echocardiogram and a one-month event monitor and we'll see her back after that for further evaluation.      Lorretta Harp MD FACP,FACC,FAHA, [facility] 09/01/2013 8:16 AM

## 2013-09-09 ENCOUNTER — Ambulatory Visit (HOSPITAL_COMMUNITY)
Admission: RE | Admit: 2013-09-09 | Discharge: 2013-09-09 | Disposition: A | Discharge status: Home / Self Care | Payer: Medicare Other | Source: Ambulatory Visit | Attending: Cardiovascular Disease | Admitting: Cardiovascular Disease

## 2013-09-09 DIAGNOSIS — R002 Palpitations: Secondary | ICD-10-CM

## 2013-09-09 DIAGNOSIS — I10 Essential (primary) hypertension: Secondary | ICD-10-CM

## 2013-09-09 DIAGNOSIS — I059 Rheumatic mitral valve disease, unspecified: Secondary | ICD-10-CM | POA: Diagnosis not present

## 2013-09-09 NOTE — Progress Notes (Signed)
2D Echocardiogram Complete.  09/09/2013   Mazy Culton, RDCS

## 2013-09-29 ENCOUNTER — Ambulatory Visit: Payer: Medicare Other | Admitting: Family Medicine

## 2013-09-30 ENCOUNTER — Other Ambulatory Visit: Payer: Self-pay | Admitting: *Deleted

## 2013-09-30 DIAGNOSIS — R002 Palpitations: Secondary | ICD-10-CM

## 2013-10-04 ENCOUNTER — Emergency Department (HOSPITAL_COMMUNITY)
Admission: EM | Admit: 2013-10-04 | Discharge: 2013-10-04 | Disposition: A | Discharge status: Home / Self Care | Payer: Medicare Other | Attending: Emergency Medicine | Admitting: Emergency Medicine

## 2013-10-04 ENCOUNTER — Encounter (HOSPITAL_COMMUNITY): Payer: Self-pay | Admitting: Emergency Medicine

## 2013-10-04 ENCOUNTER — Encounter: Payer: Self-pay | Admitting: *Deleted

## 2013-10-04 DIAGNOSIS — K044 Acute apical periodontitis of pulpal origin: Secondary | ICD-10-CM | POA: Insufficient documentation

## 2013-10-04 DIAGNOSIS — E785 Hyperlipidemia, unspecified: Secondary | ICD-10-CM | POA: Diagnosis not present

## 2013-10-04 DIAGNOSIS — R220 Localized swelling, mass and lump, head: Secondary | ICD-10-CM | POA: Diagnosis not present

## 2013-10-04 DIAGNOSIS — M129 Arthropathy, unspecified: Secondary | ICD-10-CM | POA: Insufficient documentation

## 2013-10-04 DIAGNOSIS — H9209 Otalgia, unspecified ear: Secondary | ICD-10-CM | POA: Diagnosis not present

## 2013-10-04 DIAGNOSIS — Z79899 Other long term (current) drug therapy: Secondary | ICD-10-CM | POA: Diagnosis not present

## 2013-10-04 DIAGNOSIS — E119 Type 2 diabetes mellitus without complications: Secondary | ICD-10-CM | POA: Diagnosis not present

## 2013-10-04 DIAGNOSIS — K089 Disorder of teeth and supporting structures, unspecified: Secondary | ICD-10-CM | POA: Insufficient documentation

## 2013-10-04 DIAGNOSIS — K047 Periapical abscess without sinus: Secondary | ICD-10-CM

## 2013-10-04 DIAGNOSIS — Z880 Allergy status to penicillin: Secondary | ICD-10-CM | POA: Insufficient documentation

## 2013-10-04 DIAGNOSIS — R221 Localized swelling, mass and lump, neck: Secondary | ICD-10-CM | POA: Diagnosis not present

## 2013-10-04 DIAGNOSIS — I10 Essential (primary) hypertension: Secondary | ICD-10-CM | POA: Diagnosis not present

## 2013-10-04 MED ORDER — CLINDAMYCIN HCL 150 MG PO CAPS
300.0000 mg | ORAL_CAPSULE | Freq: Once | ORAL | Status: AC
  Administered 2013-10-04: 300 mg via ORAL
  Filled 2013-10-04: qty 2

## 2013-10-04 MED ORDER — CLINDAMYCIN HCL 150 MG PO CAPS: 300.0000 mg | ORAL_CAPSULE | Freq: Three times a day (TID) | ORAL | Status: DC

## 2013-10-04 NOTE — ED Provider Notes (Signed)
CSN: [id number]     Arrival date & time 10/04/13  1935 History  This chart was scribed for non-physician practitioner, Pura Spice, PA-C working with Carmin Muskrat, MD by Frederich Balding, ED scribe. This patient was seen in room TR08C/TR08C and the patient's care was started at 8:16 PM.   Chief Complaint  Patient presents with  . Otalgia  . Dental Pain   The history is provided by the patient. No language interpreter was used.   HPI Comments: Jennene Downie is a 73 y.o. female with PMH of DM who presents to the Emergency Department complaining of worsening right ear pain that started 4 days ago. States it feels like there is water in her ear. Reports rhinorrhea that started around the same time. Pt is also complaining of right facial swelling. Denies dental pain. The last time she saw a dentist was about 2 years ago. Denies fever, chills, nasal congestion, decreased hearing, ear drainage, tinnitus, visual disturbances, chest pain, SOB, abdominal pain, headaches, numbness or tingling, weakness. Patient has not taken any medications.  Past Medical History  Diagnosis Date  . Diabetes mellitus   . Hypertension   . Arthritis   . Back pain   . Hyperlipidemia   . Palpitations    Past Surgical History  Procedure Laterality Date  . Colon surgery    . Abdominal hysterectomy     Family History  Problem Relation Age of Onset  . Pancreatic cancer Sister     3 sisters died of it  . Heart attack Father 31    died  . Diabetes Mother   . Cancer Mother   . Hypertension Son   . Hypertension Daughter    History  Substance Use Topics  . Smoking status: Never Smoker   . Smokeless tobacco: Former Systems developer    Quit date: 02/05/1983  . Alcohol Use: No   OB History   Grav Para Term Preterm Abortions TAB SAB Ect Mult Living                 Review of Systems  Constitutional: Negative for fever and chills.  HENT: Positive for ear pain, facial swelling and rhinorrhea. Negative for  congestion, dental problem, ear discharge, hearing loss and tinnitus.   Eyes: Negative for visual disturbance.  Respiratory: Negative for shortness of breath.   Cardiovascular: Negative for chest pain.  Gastrointestinal: Negative for abdominal pain.  Neurological: Negative for weakness, numbness and headaches.  All other systems reviewed and are negative.  Allergies  Amoxicillin; Codeine; Penicillins; Sulfamethoxazole; and Sulfonamide derivatives  Home Medications   Prior to Admission medications   Medication Sig Start Date End Date Taking? Authorizing Provider  aspirin EC 81 MG tablet Take 81 mg by mouth daily.    Historical Provider, MD  clindamycin (CLEOCIN) 150 MG capsule Take 2 capsules (300 mg total) by mouth 3 (three) times daily. May dispense as 156m capsules 10/04/13   VPura Spice PA-C  glucose blood (ACCU-CHEK INSTANT GLUCOSE TEST) test strip Use as instructed. Test blood sugar twice daily 12/13/10   JLyndee Hensen MD  losartan-hydrochlorothiazide ([facility] 100-12.5 MG per tablet TAKE 1 TABLET BYMOUTH ONCE DAILY    MGerda Diss DO  metFORMIN (GLUCOPHAGE) 1000 MG tablet Take 1 tablet (1,000 mg total) by mouth 2 (two) times daily with a meal. 04/12/13   MGerda Diss DO  metoprolol (LOPRESSOR) 50 MG tablet take 1 tablet by mouth twice a day    MGerda Diss DO  mometasone (NASONEX) 50 MCG/ACT nasal spray Place 2 sprays into the nose daily as needed. For allergy symptoms 09/30/12   Gerda Diss, DO  MOVIPREP 100 G SOLR Take 1 kit (200 g total) by mouth once. 04/28/13   Irene Shipper, MD  Multiple Vitamins-Minerals (WOMENS ONE DAILY) TABS Take 1 tablet by mouth daily.    Historical Provider, MD  Polyethyl Glycol-Propyl Glycol (SYSTANE OP) Place 2 drops into both eyes 2 (two) times daily.     Historical Provider, MD  pravastatin (PRAVACHOL) 40 MG tablet Take 1 tablet (40 mg total) by mouth daily. 07/29/13   Gerda Diss, DO  timolol (BETIMOL) 0.5 % ophthalmic solution  Place 1 drop into the right eye 2 (two) times daily. 04/12/13   Gerda Diss, DO   BP 135/58  Pulse 61  Temp(Src) 97.6 F (36.4 C) (Oral)  Resp 12  Ht '5\' 2"'  (1.575 m)  Wt 192 lb (87.091 kg)  BMI 35.11 kg/m2  SpO2 97%  Physical Exam  Nursing note and vitals reviewed. Constitutional: She is oriented to person, place, and time. She appears well-developed and well-nourished. No distress.  HENT:  Head: Normocephalic and atraumatic.  Right Ear: Tympanic membrane and ear canal normal. No foreign bodies. Tympanic membrane is not erythematous. No middle ear effusion.  Left Ear: Tympanic membrane and ear canal normal.  Mouth/Throat: Oropharynx is clear and moist. No trismus in the jaw. No dental abscesses.  Right sided facial swelling not involving the lips or the eye. Right TM and canal normal. No mid ear effusion. Left ear unremarkable. No foreign bodies bilaterally. Poor dentition. No drainable abscess. Inflammation along right lower tooth with significant decay. No tenderness under tongue. No mastoid tenderness. No neck masses or tenderness.  Eyes: Conjunctivae and EOM are normal.  Neck: Neck supple. No tracheal deviation present.  Cardiovascular: Normal rate, regular rhythm and normal heart sounds.   Pulmonary/Chest: Effort normal and breath sounds normal. No respiratory distress. She has no wheezes. She has no rales.  Abdominal: Soft. There is no tenderness.  Musculoskeletal: Normal range of motion.  Lymphadenopathy:    She has no cervical adenopathy.  Neurological: She is alert and oriented to person, place, and time.  Skin: Skin is warm and dry.  Psychiatric: She has a normal mood and affect. Her behavior is normal.    ED Course  Procedures (including critical care time)  DIAGNOSTIC STUDIES: Oxygen Saturation is 96% on RA, normal by my interpretation.    COORDINATION OF CARE: 8:24 PM-Discussed treatment plan which includes an antibiotic with pt at bedside and pt agreed to  plan. Will give pt dental referrals and advised her to follow up.   Labs Review Labs Reviewed - No data to display  Imaging Review No results found.   EKG Interpretation None     Meds given in ED:  Medications  clindamycin (CLEOCIN) capsule 300 mg (300 mg Oral Given 10/04/13 2137)    Discharge Medication List as of 10/04/2013  9:31 PM    START taking these medications   Details  clindamycin (CLEOCIN) 150 MG capsule Take 2 capsules (300 mg total) by mouth 3 (three) times daily. May dispense as 130m capsules, Starting 10/04/2013, Until Discontinued, Print          MDM   Final diagnoses:  Dental infection  Right facial swelling   DKinze Labois a 73y.o. female complaining of right sided ear pain, dental problems, and right sided facial swelling. Patient  denied dental or facial pain in ED. VSS. Right ear without effusion, injection or infection. Patient has poor dental hygiene with significant tooth decay. Patient did not have drainable abscess. No trismus or tongue tenderness or mastoid tenderness. I suspect that her facial swelling is due to her dental infection. I doubt ludwig angina or mastoiditis. Patient has no eye involvement. I doubt orbital cellulitis. Patient given one dose of clindamycin in the ED and sent home with a script. Patient urged to see a dentist as the source of her problem is the tooth which needs to be evaluated and treated by a dentist. Patient given ED resources for dentists.  Discussed return precautions with patient. Discussed all results and patient verbalizes understanding and agrees with plan.  I personally performed the services described in this documentation, which was scribed in my presence. The recorded information has been reviewed and is accurate.  Pura Spice, PA-C 10/05/13 [phone]

## 2013-10-04 NOTE — Discharge Instructions (Signed)
Return to the emergency room with worsening of symptoms, new symptoms or with symptoms that are concerning, severe pain, swelling, fevers, difficulty breathing, swelling that affects your vision.  Make an appointment to see a dentist as soon as possible. Use the resources below if you do not have a dentist. Take all antibiotics. Please call your doctor for a followup appointment within 24-48 hours. When you talk to your doctor please let them know that you were seen in the emergency department and have them acquire all of your records so that they can discuss the findings with you and formulate a treatment plan to fully care for your new and ongoing problems.   Abscessed Tooth An abscessed tooth is an infection around your tooth. It may be caused by holes or damage to the tooth (cavity) or a dental disease. An abscessed tooth causes mild to very bad pain in and around the tooth. See your dentist right away if you have tooth or gum pain. HOME CARE  Take your medicine as told. Finish it even if you start to feel better.  Do not drive after taking pain medicine.  Rinse your mouth (gargle) often with salt water ( teaspoon salt in 8 ounces of warm water).  Do not apply heat to the outside of your face. GET HELP RIGHT AWAY IF:   You have a temperature by mouth above 102 F (38.9 C), not controlled by medicine.  You have chills and a very bad headache.  You have problems breathing or swallowing.  Your mouth will not open.  You develop puffiness (swelling) on the neck or around the eye.  Your pain is not helped by medicine.  Your pain is getting worse instead of better. MAKE SURE YOU:   Understand these instructions.  Will watch your condition.  Will get help right away if you are not doing well or get worse. Document Released: 07/10/2007 Document Revised: 04/15/2011 Document Reviewed: 05/01/2010 Amber [name] Patient Information 2015 [name], [name] This information is not intended to  replace advice given to you by your health care provider. Make sure you discuss any questions you have with your health care provider.    Emergency Department Resource Guide 1) Find a Doctor and Pay Out of Pocket Although you won't have to find out who is covered by your insurance plan, it is a good idea to ask around and get recommendations. You will then need to call the office and see if the doctor you have chosen will accept you as a new patient and what types of options they offer for patients who are self-pay. Some doctors offer discounts or will set up payment plans for their patients who do not have insurance, but you will need to ask so you aren't surprised when you get to your appointment.  2) Contact Your Local Health Department Not all health departments have doctors that can see patients for sick visits, but many do, so it is worth a call to see if yours does. If you don't know where your local health department is, you can check in your phone book. The CDC also has a tool to help you locate your state's health department, and many state websites also have listings of all of their local health departments.  3) Find a [facility] If your illness is not likely to be very severe or complicated, you may want to try a walk in clinic. These are popping up all over the country in pharmacies, drugstores, and shopping centers. They're  usually staffed by nurse practitioners or physician assistants that have been trained to treat common illnesses and complaints. They're usually fairly quick and inexpensive. However, if you have serious medical issues or chronic medical problems, these are probably not your best option.  No Primary Care Doctor: - Call Health Connect at  [phone] - they can help you locate a primary care doctor that  accepts your insurance, provides certain services, etc. - Physician Referral Service- [phone]  Chronic Pain Problems: Organization         Address  Phone    Notes  [facility]  [phone] Patients need to be referred by their primary care doctor.   Medication Assistance: Organization         Address  Phone   Notes  [facility] Medication [name] [name], [name] [address] [phone] --Must be a resident of [name] -- Must have NO insurance coverage whatsoever (no Medicaid/ Medicare, etc.) -- The pt. MUST have a primary care doctor that directs their care regularly and follows them in the community   MedAssist  [phone]   [employer]  [phone]    Agencies that provide inexpensive medical care: Organization         Address  Phone   Notes  [place]  [phone]   [facility]    [phone]   [name] [place], [place] [address] [phone]   [place] [address] [place] [phone]   Planned Parenthood    [phone]   [facility]    [phone]   [place] and [address], [place] Phone:  [phone], Fax:  [phone] Hours of Operation:  9 am - 6 pm, M-F.  Also accepts Medicaid/Medicare and self-pay.  [name] for Adair [address], [address], [place] Phone: [phone], Fax: [phone]. Hours of Operation:  8:30 am - 5:30 pm, M-F.  Also accepts Medicaid and self-pay.  [name] [place] [address], [place] Phone: [phone]   [place], [place], [place] [phone], Ext. [phone] Mondays & Thursdays: 7-9 AM.  First 15 patients are seen on a first come, first serve basis.    [place] Providers:  Organization         Address  Phone   Notes  [facility] [address], [place] [phone] Also accepts self-pay patients.  [facility]  [address], [place]  [phone]   [place], [address], [place] [phone]   [facility] [address] [place] [phone]   Lucianne Lei [address], [place]   [phone] Only accepts [place] Access [place] patients after they have their name applied to their card.   Self-Pay (no insurance) in [facility]:  Organization         Address  Phone   Notes  Sickle Cell Patients, [facility] Internal Medicine [place] [phone]   [name] [place] [phone]   [facility]gent [place]  [place], [address], [place] [phone]   [facility]/Dr. Osei-Bonsu  [address] [place] or [address], [place] [phone] Phone number for both [place] and [place] locations is the same.  [facility]gent [facility] [address], [place] [phone]   [facility] [address] [place] or [address] [phone]   [name] [address], [place] [phone], phone; [phone], fax Sees patients 1st and 3rd Saturday of every month.  Must not qualify for public or private insurance (i.e. Medicaid, Medicare, [place] Health Choice, Veterans' Benefits)  Household income should be no more than 200% of the poverty level The clinic cannot treat you if you are pregnant or think you are pregnant  Sexually transmitted diseases are not treated at the clinic.    Dental Care: Organization         Address  Phone  Notes  [facility] Department of [facility] [place] [phone] Accepts children up to age 82 who are enrolled in [place] or [place]; pregnant women with a Medicaid card; and children who have  applied for Medicaid or [place] Health Choice, but were declined, whose parents can pay a reduced fee at time of service.  [facility] Department of [name]  [address], [place] [phone] Accepts children up to age 55 who are enrolled in [place] or [place]; pregnant women with a Medicaid card; and children who have applied for Medicaid or [place] Health Choice, but were declined, whose parents can pay a reduced fee at time of service.  [facility]  [place] [phone] Patients are seen by appointment only. Walk-ins are not accepted. [place] will see patients 52 years of age and older. Monday - Tuesday (8am-5pm) Most Wednesdays (8:30-5pm) $30 per visit, cash only  [name] Adult [facility] PRO[facility]  [address], [name] [phone] Patients are seen by appointment only. Walk-ins are not accepted. [place] will see patients 22 years of age and older. One Wednesday Evening (Monthly: Volunteer Based).  $30 per visit, cash only  [place]  [phone] for adults; Children under age 42, call Graduate Pediatric Dentistry at [phone]. Children aged 63-14, please call [phone] to request a pediatric application.  Dental services are provided in all areas of dental care including fillings, crowns and bridges, complete and partial dentures, implants, gum treatment, root canals, and extractions. Preventive care is also provided. Treatment is provided to both adults and children. Patients are selected via a lottery and there is often a waiting list.   [facility] [address] [place]  [phone] [email or web address]   [facility] [address] [place], [place] [phone], Ext. [phone] Second and Fourth Thursday of each month, opens at 6:30 AM; Clinic ends at 9 AM.  Patients are seen on a first-come first-served basis, and a  limited number are seen during each clinic.   [name] [place]  [address] Hillard Danker [place], [place] [phone]   Eligibility Requirements You must have lived in [place], [place], or [place] counties for at least the last three months.   You cannot be eligible for state or federal sponsored [employer], including [employer], [place], or [employer].   You generally cannot be eligible for healthcare insurance through  your employer.    How to apply: Eligibility screenings are held every Tuesday and Wednesday afternoon from 1:00 pm until 4:00 pm. You do not need an appointment for the interview!  [facility] [address] [place], [place]   [place]  [place] Department  [place]  [phone]    Behavioral Health Resources in the Community: Intensive Outpatient Programs Organization         Address  Phone  Notes  [place]. [address], [place], [place] [phone]   [facility] Outpatient [address], [place], [place]   ADS: Alcohol & Drug [facility] [address] [place], [place]   [place] [address],  [place], [place] or [phone]   Substance Abuse Resources Organization         Address  Phone  Notes  Alcohol and Drug Services  [phone]   [place]  [phone]   [facility] [place]   Chinita Pester  [phone]   Residential & Outpatient Substance Abuse Program  [phone]   Psychological Services Organization         Address  Phone  Notes  [facility] [place]  [place]  [phone]   [place] [address] [place] or [phone]    Mobile Crisis Teams Organization          Address  Phone  Notes  [facility], Mobile Crisis Care Unit  [phone]   [facility]  [address] [place], [place]   [place] [address], [place] [phone]    Self-Help/Support Groups Organization         Address  Phone             Notes  [place]. of [place] - variety of support groups  [place] Call for more information  Narcotics Anonymous (NA), [facility]aring [facility] [address], [employer] [place]  2 meetings at this location   Special educational needs teacher         Address  Phone  Notes  [facility] [place],    [place]  [phone]   [name]  [address], [place] [address], [place], [place]   Randall Clyman, [place] [phone] Admissions: 8am-3pm M-F  Incentives Substance [place] [address]    [place], [place] [phone]   [facility] [address] [place], [place], [place]   [facility] [address],  [place], [place]   [facility] - Intensive Outpatient [address], [place], [place]   [facility] ([place].) [place].,  [place], [place] [phone] or [phone]   [facility] Services (RTS) [address], [place], [place] Accepts Medicaid  [facility] [place] [address]  [place] [phone] Substance Abuse/Addiction Treatment   [facility] Organization         Address  Phone  Notes  [facility]  [phone]   Domenic Schwab, PhD [address] [place], [place]   [phone] or [phone]   [facility] [place], [place] [phone]  [facility] [address], [place], [place] [phone] Insurance/Medicaid/sponsorship  through [employer] and Families [address] [place], [place] [phone] [place], [place] [phone]    Dr. Adele Schilder  [phone]   [facility] Dept. 1) [address], [place] 2) [place] 3)  [place] [address], [place] [phone]  [phone]   [place] [phone] or [phone] (After Hours)

## 2013-10-04 NOTE — ED Notes (Signed)
Pt reports right sided ear ache x 4 days that is progressively worsening. Denies drainage, hearing changes. Pt also reports right lower sided dental pain x 1 month; swelling noted to right side of face. Denies fever/chills. Denies dizziness/lightheadedness. Denies taking anything for pain. AO x 4. NAD.

## 2013-10-05 NOTE — ED Provider Notes (Signed)
  Medical screening examination/treatment/procedure(s) were performed by non-physician practitioner and as supervising physician I was immediately available for consultation/collaboration.     Carmin Muskrat, MD 10/05/13 2258

## 2013-10-08 ENCOUNTER — Ambulatory Visit (HOSPITAL_COMMUNITY)
Admission: RE | Admit: 2013-10-08 | Discharge: 2013-10-08 | Disposition: A | Discharge status: Home / Self Care | Payer: Medicare Other | Source: Ambulatory Visit | Attending: Family Medicine | Admitting: Family Medicine

## 2013-10-08 ENCOUNTER — Ambulatory Visit (INDEPENDENT_AMBULATORY_CARE_PROVIDER_SITE_OTHER): Payer: Medicare Other | Admitting: Family Medicine

## 2013-10-08 ENCOUNTER — Encounter: Payer: Self-pay | Admitting: Family Medicine

## 2013-10-08 VITALS — BP 132/66 | HR 80 | Ht 62.0 in | Wt 196.0 lb

## 2013-10-08 DIAGNOSIS — M259 Joint disorder, unspecified: Secondary | ICD-10-CM | POA: Insufficient documentation

## 2013-10-08 DIAGNOSIS — M25473 Effusion, unspecified ankle: Secondary | ICD-10-CM | POA: Insufficient documentation

## 2013-10-08 DIAGNOSIS — M25579 Pain in unspecified ankle and joints of unspecified foot: Secondary | ICD-10-CM | POA: Insufficient documentation

## 2013-10-08 DIAGNOSIS — M25476 Effusion, unspecified foot: Secondary | ICD-10-CM | POA: Diagnosis not present

## 2013-10-08 DIAGNOSIS — M25472 Effusion, left ankle: Secondary | ICD-10-CM

## 2013-10-08 DIAGNOSIS — M7989 Other specified soft tissue disorders: Secondary | ICD-10-CM | POA: Insufficient documentation

## 2013-10-08 MED ORDER — TRAMADOL HCL 50 MG PO TABS: 50.0000 mg | ORAL_TABLET | Freq: Three times a day (TID) | ORAL | Status: DC | PRN

## 2013-10-08 NOTE — Progress Notes (Signed)
Patient ID: Amber [name], female   DOB: 1940-12-16, 73 y.o.   MRN: [id number] HPI:  Pt presents for a same day appointment to discuss left ankle swelling. Started noticing swelling on Monday (5 days ago). No injury. Has been soaking in warm water. It's generally sore around the ankle. Has not been red. Mild pain with walking. Has had this in the past, the last time it got swollen was about 6 months ago.  No chest pain or shortness of breath. Tylenol has not helped. Has not iced the ankle.  Currently undergoing course of clindamycin for a tooth infection.  ROS: See HPI  [place]: hx T2DM, HTN, iron def anemia, MVP, GERD, osteoarthritis, HLD. NO hx of seizures.  PHYSICAL EXAM: BP 132/66  Pulse 80  Ht 5\' 2"  (1.575 m)  Wt 196 lb (88.905 kg)  BMI 35.84 kg/m2 Gen: NAD HEENT: NCAT Heart: RRR no murmurs Lungs: CTAB NWOB Neuro: grossly nonfocal speech normal Ext: left ankle with moderate swelling. 2+ DP pulses bilat. No calf tenderness, warmth, or erythema over either leg. No palpable cords. Left ankle TTP over lateral malleolus. No ankle instability, full strength with inversion/eversion/plantarflexion, dorsiflexion. Pain with passive manipulation of ankle.  ASSESSMENT/PLAN:  1. Left ankle swelling: etiology not clear, no hx of obvious injury. Suspect mild arthritis flare but need to rule out more dangerous causes such as DVT. -doppler left lower extremity to r/o DVT -xray left ankle -tramadol prn pain -ice and elevation of ankle -f/u if not improving next week  FOLLOW UP: F/u as needed if symptoms worsen or do not improve.   [place]. Ardelia Mems, [place]

## 2013-10-08 NOTE — Progress Notes (Signed)
*  Preliminary Results* Left lower extremity venous duplex completed. Left lower extremity is negative for deep vein thrombosis. There is no evidence of left Baker's cyst.  10/08/2013 4:04 PM  Maudry Mayhew, RVT, RDCS, RDMS

## 2013-10-08 NOTE — Patient Instructions (Signed)
It was nice to meet you today!  For your ankle: -we are checking an ultrasound to make sure you don't have a blood clot -we are also getting an xray of your ankle -take tramadol as needed for pain -ice the ankle and keep it elevated -follow up next week if it is not improving.  Be well, Dr. Ardelia Mems

## 2013-10-15 DIAGNOSIS — H40059 Ocular hypertension, unspecified eye: Secondary | ICD-10-CM | POA: Diagnosis not present

## 2013-10-20 ENCOUNTER — Other Ambulatory Visit: Payer: Self-pay | Admitting: *Deleted

## 2013-10-20 DIAGNOSIS — E119 Type 2 diabetes mellitus without complications: Secondary | ICD-10-CM

## 2013-10-20 MED ORDER — METFORMIN HCL 1000 MG PO TABS: 1000.0000 mg | ORAL_TABLET | Freq: Two times a day (BID) | ORAL | Status: DC

## 2013-10-20 NOTE — Telephone Encounter (Signed)
Refilled x3.  Please have patient schedule a meet new provider/A1C check.

## 2013-10-20 NOTE — Telephone Encounter (Signed)
Pt informed of medication refill.  Appt 11/08/13 at 11 AM.  Derl Barrow, RN

## 2013-10-21 DIAGNOSIS — H35369 Drusen (degenerative) of macula, unspecified eye: Secondary | ICD-10-CM | POA: Diagnosis not present

## 2013-10-21 DIAGNOSIS — H348192 Central retinal vein occlusion, unspecified eye, stable: Secondary | ICD-10-CM | POA: Diagnosis not present

## 2013-10-21 DIAGNOSIS — E119 Type 2 diabetes mellitus without complications: Secondary | ICD-10-CM | POA: Diagnosis not present

## 2013-11-02 ENCOUNTER — Ambulatory Visit: Payer: Medicare Other | Admitting: Cardiovascular Disease

## 2013-11-08 ENCOUNTER — Ambulatory Visit (INDEPENDENT_AMBULATORY_CARE_PROVIDER_SITE_OTHER): Payer: Medicare Other | Admitting: Family Medicine

## 2013-11-08 ENCOUNTER — Encounter: Payer: Self-pay | Admitting: Family Medicine

## 2013-11-08 VITALS — BP 160/86 | HR 62 | Temp 98.2°F | Wt 194.0 lb

## 2013-11-08 DIAGNOSIS — Z23 Encounter for immunization: Secondary | ICD-10-CM

## 2013-11-08 DIAGNOSIS — L219 Seborrheic dermatitis, unspecified: Secondary | ICD-10-CM | POA: Diagnosis not present

## 2013-11-08 DIAGNOSIS — E119 Type 2 diabetes mellitus without complications: Secondary | ICD-10-CM | POA: Diagnosis present

## 2013-11-08 DIAGNOSIS — T2210XA Burn of first degree of shoulder and upper limb, except wrist and hand, unspecified site, initial encounter: Secondary | ICD-10-CM

## 2013-11-08 LAB — POCT GLYCOSYLATED HEMOGLOBIN (HGB A1C): Hemoglobin A1C: 5.9

## 2013-11-08 NOTE — Assessment & Plan Note (Signed)
Today's A1c, 5.9 from 6.5 three months ago.  No concerns for low BGs.   -Continue Metformin -Return in 3 months to check A1C.  If <6.0 will discontinue Metformin and allow patient to manage BG with diet and exercise.

## 2013-11-08 NOTE — Progress Notes (Signed)
Patient ID: Diandra Cimini, female   DOB: 05/02/1940, 73 y.o.   MRN: [id number]    Subjective: CC: R ear itching, Burn on R elbow, Glucose f/u HPI: Patient is a 73 y.o. female presenting to clinic today to meet her new doctor. Concerns today include:  1. R ear itching: Patient states that her right ear began itching about 4 days ago.  She denies usage of q-tips, swimming or getting any water in her ears.  Denies fever, pain or drainage.  Denies any rhinorrhea, watery itchy eye or any other atopic symptoms.  2. R elbow burn: Patient states that she burned her right elbow on her iron yesterday.  She denies any blistering or drainage from the wound.  She has been keeping area clean with soap and water and applying vaseline.  She admits to mild pain for which she is not taking any medication for.  3. Diabetes: Patient reports that she is compliant with her diabetic medication.  Last A1c 6.5.  She states that she has been more active and eating better and that she has lost about 6lbs.  She is feeling more energized.  Denies any changes in sensation, foot ulcers, polyuria.   History Reviewed: non smoker. Health Maintenance: Will receive flu and prevnar vaccines today.  ROS: All other systems reviewed and are negative.  Objective: Office vital signs reviewed. BP 160/86  Pulse 62  Temp(Src) 98.2 F (36.8 C) (Oral)  Wt 194 lb (87.998 kg)  Physical Examination:  General: Awake, alert, well nourished, NAD HEENT: Atraumatic, normocephalic    Ears: TMs intact, slightly opaque, normal light reflex, no erythema, no bulging, R external auditory canal with dry skin/flaking but no erythema, edema or exudate.    Nose: nasal turbinates moist    Throat: MMM, no erythema Cardio: RRR, S1S2 heard, no murmurs appreciated, no thrills Pulm: CTAB, no wheezes, rhonchi or rales, no increased WOB MSK: Normal gait and station  HgbA1C: 5.9   Assessment: 73 y.o. female that is well appearing here to  meet me for the first time. 1. Seborrheic dermatitis of the external auditory canal 2. Burn R elbow 3. T2DM, controlled   Plan: See Problem List and After Visit Summary   Janora Norlander, DO PGY-1, [facility] 11/08/13, 12:22 pm

## 2013-11-08 NOTE — Assessment & Plan Note (Signed)
1st degree burn of R elbow.  No evidence of infection. -Patient counseled on signs of infection -AVS also given with information on burn care -Patient to keep area clean with soap and water and monitor for infection -Will call if area worsens

## 2013-11-08 NOTE — Assessment & Plan Note (Signed)
No signs of infection or inflammation.   -Patient to refrain from using q-tips or placing any objects in ear to scratch -Patient to use small amount of mineral oil on fingertip applied to the external area of ear for relief

## 2013-11-08 NOTE — Patient Instructions (Signed)
It was a pleasure seeing you today, Ms Amber [name]!  Information regarding what we discussed is included in this packet.  Please feel free to call our office if any questions or concerns arise.  For your burn please continue to keep area clean and dry.  You can continue to apply vaseline.  Please read below for signs of infection.  If you experience any of these, please call the office for an appointment.  You should be very proud of how well you've been controlling your blood sugars.  Also, because your A1C looked so good today, let's plan to see you back in 3 months to recheck it.  If it is below 6 again, we may be able to stop one of your medications! Amber [name]!  Your ears looked great.  There were no signs of infection or abnormalities.  You can place a little bit of mineral oil with the tip of your finger on the outside of the ear canal to moisturize your ear.  This should help with the itching.  Keep up the good work with your exercise!  Amber [name], [place] hurt your skin. When your skin is hurt, it is easier to get an infection. Follow your doctor's directions to help prevent an infection. HOME CARE  Wash your hands well before you change your bandage.  Change your bandage as often as told by your doctor.  Remove the old bandage. If the bandage sticks, soak it off with cool, clean water.  Gently clean the burn with mild soap and water.  Pat the burn dry with a clean, dry cloth.  Put a thin layer of medicated cream on the burn.  Put a clean bandage on as told by your doctor.  Keep the bandage clean and dry.  Raise (elevate) the burn for the first 24 hours. After that, follow your doctor's directions.  Only take medicine as told by your doctor. GET HELP RIGHT AWAY IF:   You have too much pain.  The skin near the burn is red, tender, puffy (swollen), or has red streaks.  The burn area has yellowish white fluid (pus) or a bad smell coming from it.  You have a  fever. MAKE SURE YOU:   Understand these instructions.  Will watch your condition.  Will get help right away if you are not doing well or get worse. Document Released: 10/31/2007 Document Revised: 04/15/2011 Document Reviewed: 06/13/2010 [facility] Patient Information 2015 [place], [name] This information is not intended to replace advice given to you by your health care provider. Make sure you discuss any questions you have with your health care provider.

## 2013-12-15 ENCOUNTER — Ambulatory Visit: Payer: Medicare Other | Admitting: Family Medicine

## 2013-12-22 ENCOUNTER — Ambulatory Visit (INDEPENDENT_AMBULATORY_CARE_PROVIDER_SITE_OTHER): Payer: Medicare Other | Admitting: Family Medicine

## 2013-12-22 ENCOUNTER — Encounter: Payer: Self-pay | Admitting: Family Medicine

## 2013-12-22 VITALS — BP 160/88 | HR 71 | Temp 98.2°F | Wt 190.0 lb

## 2013-12-22 DIAGNOSIS — R220 Localized swelling, mass and lump, head: Secondary | ICD-10-CM | POA: Diagnosis not present

## 2013-12-22 NOTE — Patient Instructions (Signed)
Dear Amber [name], Thank you for coming in to clinic today.  1. The spot on your left face may have been a spider bite (non-poisonous), other bug or mosquito. It looks like an irritation reaction, but does not look infected. 2. It looks like it is healing well. 3. Recommend avoid using any more topical alcohol - this can irritate it more and prevent it from healing 4. Continue moisturizer as needed. Avoid scratching it. May try topical benadryl cream if still itching.  If you develop significantly worsening swelling, redness, or pain. Fevers/chills, drainage of pus or bleeding, please schedule follow-up appointment in clinic to have it checked out again.  If you have any other questions or concerns, please feel free to call the clinic to contact me. You may also schedule an earlier appointment if necessary.  However, if your symptoms get significantly worse, please go to the Emergency Department to seek immediate medical attention.  Nobie Putnam, [place]

## 2013-12-22 NOTE — Progress Notes (Signed)
   Subjective:    Patient ID: Amber [name], female    DOB: 1940-11-01, 73 y.o.   MRN: [id number]  Patient presents for a same day appointment.  HPI  SWOLLEN SPOT ON FACE, LEFT: - Reported that she first noticed spot on left cheek / face on Tuesday when she woke up, noticed a spot on her face that was itching, so she initially scratched it, and looked in mirror to see swollen, red, and tender to touch. Tried rubbing it and using alcohol regularly without relief. Tried moisturizer cream. - Currently spot is significantly improved since onset, less red, no longer painful, improved swelling. She is concerned that it may be a spider bite or a bug bite - Denies fevers/chills, other rash or spots, redness  I have reviewed and updated the following as appropriate: allergies and current medications  Social Hx: - Never smoker  Review of Systems  See above HPI    Objective:   Physical Exam  BP 160/88 mmHg  Pulse 71  Temp(Src) 98.2 F (36.8 C) (Oral)  Wt 190 lb (86.183 kg)  Gen - well-appearing, pleasant, NAD HEENT / Skin - NCAT, small < 1x1 cm spot of very mild erythema with tiny < 1 mm healing abrasion on Left upper cheek not affecting lower eyelid, without any edema, warmth, or tenderness to palpation. No fluctuance. No other erythema or lesions on face. Bilateral eyes normal with clear sclera, EOMI, and PERRL, MMM Neck - supple, non-tender, no LAD     Assessment & Plan:   See specific A&P problem list for details.

## 2013-12-22 NOTE — Assessment & Plan Note (Signed)
Mostly resolved, suspected due to insect bite or scratch due to irritation - Small area left upper cheek, now mostly resolved erythema, tenderness, swelling. No evidence for infection. No eyelid involvement.  Plan: 1. Reassurance, suspect self limited 2. May continue topical moisturizer PRN, can do warm compresses PRN 3. Discontinue topical alcohol rub 4. If needed can use topical benadryl or anti-histamine cream/ointment if persistent itching 5. RTC only if symptoms return and significant

## 2014-01-06 ENCOUNTER — Other Ambulatory Visit: Payer: Self-pay

## 2014-01-06 DIAGNOSIS — Z1231 Encounter for screening mammogram for malignant neoplasm of breast: Secondary | ICD-10-CM

## 2014-01-20 DIAGNOSIS — H35351 Cystoid macular degeneration, right eye: Secondary | ICD-10-CM | POA: Diagnosis not present

## 2014-01-20 DIAGNOSIS — H34811 Central retinal vein occlusion, right eye: Secondary | ICD-10-CM | POA: Diagnosis not present

## 2014-02-15 ENCOUNTER — Other Ambulatory Visit: Payer: Self-pay | Admitting: *Deleted

## 2014-02-15 MED ORDER — PRAVASTATIN SODIUM 40 MG PO TABS: 40.0000 mg | ORAL_TABLET | Freq: Every day | ORAL | Status: DC

## 2014-02-21 ENCOUNTER — Other Ambulatory Visit: Payer: Self-pay | Admitting: *Deleted

## 2014-02-21 DIAGNOSIS — E119 Type 2 diabetes mellitus without complications: Secondary | ICD-10-CM

## 2014-02-21 MED ORDER — METFORMIN HCL 1000 MG PO TABS: 1000.0000 mg | ORAL_TABLET | Freq: Two times a day (BID) | ORAL | Status: DC

## 2014-02-21 NOTE — Telephone Encounter (Signed)
Will RF x1.  As indicated at last visit.  Would love to see patient for repeat A1C to evaluate necessity of continued therapy with Metformin.  Please advise

## 2014-02-21 NOTE — Telephone Encounter (Signed)
Appt with PCP 02/25/2014 at 4 PM.  Derl Barrow, RN

## 2014-02-25 ENCOUNTER — Ambulatory Visit: Payer: Medicare Other | Admitting: Family Medicine

## 2014-03-10 DIAGNOSIS — H34811 Central retinal vein occlusion, right eye: Secondary | ICD-10-CM | POA: Diagnosis not present

## 2014-03-10 DIAGNOSIS — H35351 Cystoid macular degeneration, right eye: Secondary | ICD-10-CM | POA: Diagnosis not present

## 2014-03-10 LAB — HM DIABETES EYE EXAM

## 2014-03-18 DIAGNOSIS — H40053 Ocular hypertension, bilateral: Secondary | ICD-10-CM | POA: Diagnosis not present

## 2014-03-20 ENCOUNTER — Emergency Department (HOSPITAL_COMMUNITY): Payer: Medicare Other

## 2014-03-20 ENCOUNTER — Emergency Department (HOSPITAL_COMMUNITY)
Admission: EM | Admit: 2014-03-20 | Discharge: 2014-03-20 | Disposition: A | Discharge status: Home / Self Care | Payer: Medicare Other | Attending: Emergency Medicine | Admitting: Emergency Medicine

## 2014-03-20 ENCOUNTER — Encounter (HOSPITAL_COMMUNITY): Payer: Self-pay | Admitting: Emergency Medicine

## 2014-03-20 DIAGNOSIS — M1990 Unspecified osteoarthritis, unspecified site: Secondary | ICD-10-CM | POA: Diagnosis not present

## 2014-03-20 DIAGNOSIS — R079 Chest pain, unspecified: Secondary | ICD-10-CM | POA: Diagnosis present

## 2014-03-20 DIAGNOSIS — E119 Type 2 diabetes mellitus without complications: Secondary | ICD-10-CM | POA: Insufficient documentation

## 2014-03-20 DIAGNOSIS — Z7982 Long term (current) use of aspirin: Secondary | ICD-10-CM | POA: Diagnosis not present

## 2014-03-20 DIAGNOSIS — Z880 Allergy status to penicillin: Secondary | ICD-10-CM | POA: Insufficient documentation

## 2014-03-20 DIAGNOSIS — Z79899 Other long term (current) drug therapy: Secondary | ICD-10-CM | POA: Insufficient documentation

## 2014-03-20 DIAGNOSIS — Z7951 Long term (current) use of inhaled steroids: Secondary | ICD-10-CM | POA: Insufficient documentation

## 2014-03-20 DIAGNOSIS — R0789 Other chest pain: Secondary | ICD-10-CM | POA: Diagnosis not present

## 2014-03-20 DIAGNOSIS — I10 Essential (primary) hypertension: Secondary | ICD-10-CM | POA: Diagnosis not present

## 2014-03-20 DIAGNOSIS — E785 Hyperlipidemia, unspecified: Secondary | ICD-10-CM | POA: Insufficient documentation

## 2014-03-20 DIAGNOSIS — R202 Paresthesia of skin: Secondary | ICD-10-CM

## 2014-03-20 DIAGNOSIS — I517 Cardiomegaly: Secondary | ICD-10-CM | POA: Diagnosis not present

## 2014-03-20 LAB — URINALYSIS, ROUTINE W REFLEX MICROSCOPIC
Bilirubin Urine: NEGATIVE
Glucose, UA: NEGATIVE mg/dL
HGB URINE DIPSTICK: NEGATIVE
Ketones, ur: NEGATIVE mg/dL
Nitrite: NEGATIVE
PH: 5 (ref 5.0–8.0)
Protein, ur: NEGATIVE mg/dL
Specific Gravity, Urine: 1.008 (ref 1.005–1.030)
UROBILINOGEN UA: 0.2 mg/dL (ref 0.0–1.0)

## 2014-03-20 LAB — BASIC METABOLIC PANEL
Anion gap: 13 (ref 5–15)
BUN: 10 mg/dL (ref 6–23)
CHLORIDE: 105 mmol/L (ref 96–112)
CO2: 23 mmol/L (ref 19–32)
Calcium: 9.7 mg/dL (ref 8.4–10.5)
Creatinine, Ser: 0.9 mg/dL (ref 0.50–1.10)
GFR calc Af Amer: 72 mL/min — ABNORMAL LOW (ref >90)
GFR, EST NON AFRICAN AMERICAN: 62 mL/min — AB (ref >90)
Glucose, Bld: 122 mg/dL — ABNORMAL HIGH (ref 70–99)
Potassium: 3.6 mmol/L (ref 3.5–5.1)
Sodium: 141 mmol/L (ref 135–145)

## 2014-03-20 LAB — I-STAT TROPONIN, ED: Troponin i, poc: 0 ng/mL (ref 0.00–0.08)

## 2014-03-20 LAB — CBC
HEMATOCRIT: 36.6 % (ref 36.0–46.0)
HEMOGLOBIN: 11.5 g/dL — AB (ref 12.0–15.0)
MCH: 24.4 pg — ABNORMAL LOW (ref 26.0–34.0)
MCHC: 31.4 g/dL (ref 30.0–36.0)
MCV: 77.7 fL — AB (ref 78.0–100.0)
Platelets: 281 10*3/uL (ref 150–400)
RBC: 4.71 MIL/uL (ref 3.87–5.11)
RDW: 14.5 % (ref 11.5–15.5)
WBC: 5.8 10*3/uL (ref 4.0–10.5)

## 2014-03-20 LAB — URINE MICROSCOPIC-ADD ON

## 2014-03-20 LAB — CBG MONITORING, ED: GLUCOSE-CAPILLARY: 81 mg/dL (ref 70–99)

## 2014-03-20 MED ORDER — ASPIRIN 81 MG PO CHEW
243.0000 mg | CHEWABLE_TABLET | Freq: Once | ORAL | Status: AC
  Administered 2014-03-20: 243 mg via ORAL
  Filled 2014-03-20: qty 3

## 2014-03-20 NOTE — ED Notes (Signed)
Pt reports when she woke up this morning felt like something was stuck in her chest. Pt reports left sided tingling to lip. Pt presents with mouth twisted to left side. Speech clear. Equal grips.

## 2014-03-20 NOTE — ED Provider Notes (Signed)
CSN: [id number]     Arrival date & time 03/20/14  1021 History   First MD Initiated Contact with Patient 03/20/14 1032     Chief Complaint  Patient presents with  . Tingling  . Chest Pain     (Consider location/radiation/quality/duration/timing/severity/associated sxs/prior Treatment) HPI  Amber [name] is a 74 y.o. female with PMH of diabetes, hypertension, hyperlipidemia, palpitations presenting with centralized chest soreness. Patient states she feels like something is stuck in her chest. It has been persistent. She tried eating and drinking without improvement. She takes a daily aspirin but has taken nothing further for her discomfort. She denies any shortness of breath, nausea, vomiting didn't endorse diaphoresis at onset of chest pain that is now resolved. No leg swelling. Patient also noted left-sided tingling to left upper lip. She denies any slurred speech or visual changes or weakness. No tingling elsewhere. Patient denies headache recent falls or head trauma. Patient stated she had a stress test many years ago without problems per patient. She denies having cardiac catheterization. Per EMR records patient had echocardiogram in September 2015 with EF of 60%.    Past Medical History  Diagnosis Date  . Diabetes mellitus   . Hypertension   . Arthritis   . Back pain   . Hyperlipidemia   . Palpitations    Past Surgical History  Procedure Laterality Date  . Colon surgery    . Abdominal hysterectomy     Family History  Problem Relation Age of Onset  . Pancreatic cancer Sister     3 sisters died of it  . Heart attack Father 56    died  . Diabetes Mother   . Cancer Mother   . Hypertension Son   . Hypertension Daughter    History  Substance Use Topics  . Smoking status: Never Smoker   . Smokeless tobacco: Former Systems developer    Quit date: 02/05/1983  . Alcohol Use: No   OB History    No data available     Review of Systems 10 Systems reviewed and are negative for  acute change except as noted in the HPI.    Allergies  Amoxicillin; Codeine; Penicillins; Sulfamethoxazole; and Sulfonamide derivatives  Home Medications   Prior to Admission medications   Medication Sig Start Date End Date Taking? Authorizing Provider  aspirin EC 81 MG tablet Take 81 mg by mouth daily.   Yes Historical Provider, MD  losartan-hydrochlorothiazide (HYZAAR) 100-12.5 MG per tablet TAKE 1 TABLET BYMOUTH ONCE DAILY   Yes Gerda Diss, DO  metFORMIN (GLUCOPHAGE) 1000 MG tablet Take 1 tablet (1,000 mg total) by mouth 2 (two) times daily with a meal. 02/21/14  Yes Ashly M Gottschalk, DO  metoprolol (LOPRESSOR) 50 MG tablet take 1 tablet by mouth twice a day   Yes Gerda Diss, DO  mometasone (NASONEX) 50 MCG/ACT nasal spray Place 2 sprays into the nose daily as needed. For allergy symptoms 09/30/12  Yes Gerda Diss, DO  Multiple Vitamins-Minerals (WOMENS ONE DAILY) TABS Take 1 tablet by mouth daily.   Yes Historical Provider, MD  Polyethyl Glycol-Propyl Glycol (SYSTANE OP) Place 2 drops into both eyes 2 (two) times daily.    Yes Historical Provider, MD  pravastatin (PRAVACHOL) 40 MG tablet Take 1 tablet (40 mg total) by mouth daily. 02/15/14  Yes Ashly M Gottschalk, DO  timolol (BETIMOL) 0.5 % ophthalmic solution Place 1 drop into the right eye 2 (two) times daily. 04/12/13  Yes Gerda Diss, DO  glucose blood (ACCU-CHEK INSTANT GLUCOSE TEST) test strip Use as instructed. Test blood sugar twice daily 12/13/10   Lyndee Hensen, MD   BP 119/56 mmHg  Pulse 63  Temp(Src) 98 F (36.7 C) (Oral)  Resp 14  Ht 5\' 2"  (1.575 m)  Wt 184 lb (83.462 kg)  BMI 33.65 kg/m2  SpO2 98% Physical Exam  Constitutional: She appears well-developed and well-nourished. No distress.  HENT:  Head: Normocephalic and atraumatic.  Mouth/Throat: Oropharynx is clear and moist.  Eyes: Conjunctivae and EOM are normal. Pupils are equal, round, and reactive to light. Right eye exhibits no discharge.  Left eye exhibits no discharge.  Neck: Normal range of motion. Neck supple. No thyromegaly present.  No nuchal rigidity  Cardiovascular: Normal rate and regular rhythm.   Pulmonary/Chest: Effort normal and breath sounds normal. No respiratory distress. She has no wheezes.  Abdominal: Soft. Bowel sounds are normal. She exhibits no distension. There is no tenderness.  Neurological: She is alert. She has normal reflexes. No cranial nerve deficit. Coordination normal.  Speech is clear and goal oriented. Lower facial asymmetry. Strength 5/5 in upper and lower extremities. Sensation intact. Intact rapid alternating movements, finger to nose, and heel to shin. Positive Romberg. Pt with dizziness and leaned to right side requiring assistance to prevent fall. No pronator drift. Normal gait.   Skin: Skin is warm and dry. She is not diaphoretic.  Nursing note and vitals reviewed.   ED Course  Procedures (including critical care time) Labs Review Labs Reviewed  CBC - Abnormal; Notable for the following:    Hemoglobin 11.5 (*)    MCV 77.7 (*)    MCH 24.4 (*)    All other components within normal limits  BASIC METABOLIC PANEL - Abnormal; Notable for the following:    Glucose, Bld 122 (*)    GFR calc non Af Amer 62 (*)    GFR calc Af Amer 72 (*)    All other components within normal limits  URINALYSIS, ROUTINE W REFLEX MICROSCOPIC - Abnormal; Notable for the following:    Leukocytes, UA SMALL (*)    All other components within normal limits  URINE MICROSCOPIC-ADD ON  I-STAT TROPOININ, ED  CBG MONITORING, ED    Imaging Review Dg Chest 2 View  03/20/2014   CLINICAL DATA:  Acute chest pain.  Initial encounter.  -  EXAM: CHEST  2 VIEW  COMPARISON:  03/21/2010 and prior chest radiographs dating back to 02/07/2005  FINDINGS: Cardiomegaly again identified.  Mild elevation of the right hemidiaphragm is again noted.  There is no evidence of focal airspace disease, pulmonary edema, suspicious pulmonary  nodule/mass, pleural effusion, or pneumothorax. No acute bony abnormalities are identified.  IMPRESSION: Cardiomegaly without evidence of active cardiopulmonary disease.   Electronically Signed   By: Margarette Canada M.D.   On: 03/20/2014 12:44   Ct Head Wo Contrast  03/20/2014   CLINICAL DATA:  LEFT sided lip tingling upon awakening. Drawing of the LEFT face.Initial encounter. Stroke risk factors include hypertension and diabetes.  EXAM: CT HEAD WITHOUT CONTRAST  TECHNIQUE: Contiguous axial images were obtained from the base of the skull through the vertex without intravenous contrast.  COMPARISON:  04/19/2009.  FINDINGS: No evidence for acute infarction, hemorrhage, mass lesion, hydrocephalus, or extra-axial fluid. Normal for age cerebral volume.  Asymmetric hypoattenuation of the subcortical white matter in the LEFT frontal region, associated with hyperdense central vessel consistent with an incidental, chronic, and stable developmental venous anomaly, unchanged in appearance from 04/19/2009.  Calvarium intact. Mild chronic LEFT sphenoid sinus disease. No mastoid fluid. Negative orbits.  IMPRESSION: No acute intracranial findings. Specifically no evidence for acute cortical infarction or intracranial hemorrhage. Chronic changes as described.   Electronically Signed   By: Rolla Flatten M.D.   On: 03/20/2014 12:03   Mr Brain Wo Contrast  03/20/2014   CLINICAL DATA:  LEFT sided lip tingling upon awakening. Drawing of the LEFT face. Stroke risk factors include hypertension and diabetes.Initial encounter.  EXAM: MRI HEAD WITHOUT CONTRAST  TECHNIQUE: Multiplanar, multiecho pulse sequences of the brain and surrounding structures were obtained without intravenous contrast.  COMPARISON:  CT head earlier today.  FINDINGS: No evidence for acute infarction, hemorrhage, mass lesion, hydrocephalus, or extra-axial fluid. Normal for age cerebral volume.  Mild subcortical and periventricular T2 and FLAIR hyperintensities, likely  chronic microvascular ischemic change.  Flow voids are maintained in the major intracranial vessels. There is an incidental developmental venous anomaly in the LEFT posterior frontal subcortical white matter which drains superficially to the superior sagittal sinus. No associated cavernoma or chronic blood products.  Pituitary, pineal, and cerebellar tonsils unremarkable. No upper cervical lesions. Visualized calvarium, skull base, and upper cervical osseous structures unremarkable. Scalp and extracranial soft tissues, orbits, sinuses, and mastoids show no acute process.  IMPRESSION: Chronic changes as described.  No acute intracranial abnormality.  Specifically no evidence for acute cerebral infarction.  Incidental and stable LEFT posterior frontal DVA.   Electronically Signed   By: Rolla Flatten M.D.   On: 03/20/2014 14:27     EKG Interpretation   Date/Time:  Sunday March 20 2014 10:26:14 EST Ventricular Rate:  64 PR Interval:  162 QRS Duration: 86 QT Interval:  404 QTC Calculation: 416 R Axis:   180 Text Interpretation:  Normal sinus rhythm Right axis deviation Abnormal  ECG No significant change since last tracing Confirmed by KNAPP  MD-J, JON  ([id number]) on 03/20/2014 10:41:26 AM      MDM   Final diagnoses:  Chest pain, unspecified chest pain type  Tingling   Pt presenting with chest pain described as sensation of foreign body in chest as well as tingling of upper left lip. VSS. Normal neurological exam except for lower facial asymmetry and positive romberg. EKG without significant change from prior. Negative troponin. Chest x-ray with stable cardiomegaly since 2007. No electrolyte abnormalities. Patient with mild anemia but no leukocytosis. Urine does not appear to be infected. CT head negative for acute findings. Concerned with her neurological abnormality, unclear if acute or chronic. MRI ordered for further evaluation and CVA r/o. MRI with chronic changes but no evidence for acute  cerebral infarction. Patient stable and in no acute distress. Patient pain free. Patient stable for discharge with outpatient follow-up with primary care provider. Pt with appointment in 3 days.   Discussed return precautions with patient. Discussed all results and patient verbalizes understanding and agrees with plan.  This is a shared patient. This patient was discussed with the physician, Dr. Tomi Bamberger who saw and evaluated the patient and agrees with the plan.   Pura Spice, PA-C 03/20/14 1554  Dorie Rank, MD 03/22/14 [phone]

## 2014-03-20 NOTE — ED Notes (Signed)
When standing with eyes closed, pt leaning to R side. Pt assisted to standing. Attempted to stand with eyes closed, pt continued to feel dizzy and leaning to R side.

## 2014-03-20 NOTE — ED Notes (Signed)
Patient transported to CT/Xray. 

## 2014-03-20 NOTE — ED Notes (Signed)
Pt c/o centralized chest pain starting this morning. Reports waking up this morning and felt that something was stuck in her chest; discomfort associated with diaphoresis. Also reports tingling to L side of lip.

## 2014-03-20 NOTE — Discharge Instructions (Signed)
Return to the emergency room with worsening of symptoms, new symptoms or with symptoms that are concerning , especially severe worsening of headache, visual or speech changes, weakness in face, arms or legs OR , especially chest pain that feels like a pressure, spreads to left arm or jaw, worse with exertion, associated with nausea, vomiting, shortness of breath and/or sweating.  Please call your doctor for a followup appointment within 24-48 hours. When you talk to your doctor please let them know that you were seen in the emergency department and have them acquire all of your records so that they can discuss the findings with you and formulate a treatment plan to fully care for your new and ongoing problems.  Read below information and follow recommendations.  Weakness Weakness is a lack of strength. It may be felt all over the body (generalized) or in one specific part of the body (focal). Some causes of weakness can be serious. You may need further medical evaluation, especially if you are elderly or you have a history of immunosuppression (such as chemotherapy or HIV), kidney disease, heart disease, or diabetes. CAUSES  Weakness can be caused by many different things, including:  Infection.  Physical exhaustion.  Internal bleeding or other blood loss that results in a lack of red blood cells (anemia).  Dehydration. This cause is more common in elderly people.  Side effects or electrolyte abnormalities from medicines, such as pain medicines or sedatives.  Emotional distress, anxiety, or depression.  Circulation problems, especially severe peripheral arterial disease.  Heart disease, such as rapid atrial fibrillation, bradycardia, or heart failure.  Nervous system disorders, such as Guillain-Barr syndrome, multiple sclerosis, or stroke. DIAGNOSIS  To find the cause of your weakness, your caregiver will take your history and perform a physical exam. Lab tests or X-rays may also be  ordered, if needed. TREATMENT  Treatment of weakness depends on the cause of your symptoms and can vary greatly. HOME CARE INSTRUCTIONS   Rest as needed.  Eat a well-balanced diet.  Try to get some exercise every day.  Only take over-the-counter or prescription medicines as directed by your caregiver. SEEK MEDICAL CARE IF:   Your weakness seems to be getting worse or spreads to other parts of your body.  You develop new aches or pains. SEEK IMMEDIATE MEDICAL CARE IF:   You cannot perform your normal daily activities, such as getting dressed and feeding yourself.  You cannot walk up and down stairs, or you feel exhausted when you do so.  You have shortness of breath or chest pain.  You have difficulty moving parts of your body.  You have weakness in only one area of the body or on only one side of the body.  You have a fever.  You have trouble speaking or swallowing.  You cannot control your bladder or bowel movements.  You have black or bloody vomit or stools. MAKE SURE YOU:  Understand these instructions.  Will watch your condition.  Will get help right away if you are not doing well or get worse. Document Released: 01/21/2005 Document Revised: 07/23/2011 Document Reviewed: 03/22/2011 [facility] Patient Information 2015 [place], [name] This information is not intended to replace advice given to you by your health care provider. Make sure you discuss any questions you have with your health care provider.

## 2014-03-20 NOTE — ED Notes (Signed)
CBG value 81.

## 2014-03-20 NOTE — ED Notes (Signed)
Pt not in room.

## 2014-03-23 ENCOUNTER — Ambulatory Visit (INDEPENDENT_AMBULATORY_CARE_PROVIDER_SITE_OTHER): Payer: Medicare Other | Admitting: Family Medicine

## 2014-03-23 ENCOUNTER — Encounter: Payer: Self-pay | Admitting: Family Medicine

## 2014-03-23 VITALS — BP 138/83 | HR 56 | Temp 97.8°F | Ht 62.0 in | Wt 191.6 lb

## 2014-03-23 DIAGNOSIS — Z299 Encounter for prophylactic measures, unspecified: Secondary | ICD-10-CM

## 2014-03-23 DIAGNOSIS — E119 Type 2 diabetes mellitus without complications: Secondary | ICD-10-CM

## 2014-03-23 DIAGNOSIS — Z418 Encounter for other procedures for purposes other than remedying health state: Secondary | ICD-10-CM

## 2014-03-23 LAB — POCT GLYCOSYLATED HEMOGLOBIN (HGB A1C): Hemoglobin A1C: 5.7

## 2014-03-23 MED ORDER — METFORMIN HCL 500 MG PO TABS: 500.0000 mg | ORAL_TABLET | Freq: Two times a day (BID) | ORAL | Status: DC

## 2014-03-23 MED ORDER — ZOSTER VACCINE LIVE 19400 UNT/0.65ML ~~LOC~~ SOLR: 0.6500 mL | Freq: Once | SUBCUTANEOUS | Status: DC

## 2014-03-23 NOTE — Assessment & Plan Note (Signed)
Hgb 5.7 today.  Patient doing great physically.  Foot exam performed today.  Benign.   -Decrease Metformin to 500mg  BID.   -Discussed that we may be able to discontinue this completely.  Patient a little reluctant.  Doesn't want sugars to get out of control -Will recheck A1c at July physical

## 2014-03-23 NOTE — Assessment & Plan Note (Signed)
Zostavax Rx given today Mammogram leaflet given, patient to schedule Colonoscopy leaflet given, patient to schedule with [facility]

## 2014-03-23 NOTE — Progress Notes (Signed)
Patient ID: Amber [name], female   DOB: December 02, 1940, 74 y.o.   MRN: [id number]    Subjective: CC:T2DM HPI: Patient is a 74 y.o. female presenting to clinic today for follow up on her diabetes. Concerns today include:  1. T2DM Patient reports doing really well.  She states that CBGs are 85-110.  She monitors them every other day.  She is compliant with Metformin 1000 BID.  She eats a well balanced diet. Walks for exercise several times weekly.  Denies any adverse effects.  No hypoglycemic episodes.  No nausea, vomiting, abdominal pain, bloating, dizziness.  2. Recent ED evaluation Patient states she was seen in ED recently for chest discomfort/sensation of diff swallowing.  She was ruled out for cardiac etiology.  She also had an MRI of head done to evaluate for potential stroke as she was Romberg positive.  She denies recurrence of symptoms.  She denies falls or any other neuro deficits.  Social History Reviewed: non smoker. FamHx and MedHx updated.  Please see EMR. Health Maintenance: Mammogram, colonoscopy and Zostavax information/Rx given.  ROS: All other systems reviewed and are negative.  Objective: Office vital signs reviewed. There were no vitals taken for this visit.  Physical Examination:  General: Awake, alert, well nourished, NAD HEENT: Normal, EOMI Cardio: RRR, S1S2 heard, brisk cap refill Pulm: CTAB, no wheezes, rhonchi or rales Extremities: WWP, No edema, cyanosis or clubbing; +2 pulses bilaterally MSK: Normal gait and station Skin: dry, intact, no rashes or lesions Neuro: Strength and sensation grossly intact, follows commands  Diabetic foot exam performed.  Intact sensation.  No ulcerations.  No callus.  See EMR.  HgbA1c 5.7 today  Assessment: 74 y.o. female with well controlled T2DM   Plan: See Problem List and After Visit Summary   Amber Norlander, DO PGY-1, [place]

## 2014-03-23 NOTE — Patient Instructions (Addendum)
It was a pleasure seeing you today, Amber [name]!  Information regarding what we discussed is included in this packet.  Please make an appointment to see me in July for your yearly physical.  In the meantime, consider getting the shingles shot.  I have given you a prescription for this.  Also schedule a mammogram and colonoscopy.  This should HOPEFULLY be the last time you ever have to do these.  Your A1c5.7 today. We will decrease you Metformin to 500mg  twice daily and then recheck your A1c at your physical in July.  We may be able to get you off this medication completely! yay!  Please feel free to call our office at [phone] if any questions or concerns arise.  Warm Regards, Ashly M. Gottschalk, DO  Basic Carbohydrate Counting for Diabetes Mellitus Carbohydrate counting is a method for keeping track of the amount of carbohydrates you eat. Eating carbohydrates naturally increases the level of sugar (glucose) in your blood, so it is important for you to know the amount that is okay for you to have in every meal. Carbohydrate counting helps keep the level of glucose in your blood within normal limits. The amount of carbohydrates allowed is different for every person. A dietitian can help you calculate the amount that is right for you. Once you know the amount of carbohydrates you can have, you can count the carbohydrates in the foods you want to eat. Carbohydrates are found in the following foods:  Grains, such as breads and cereals.  Dried beans and soy products.  Starchy vegetables, such as potatoes, peas, and corn.  Fruit and fruit juices.  Milk and yogurt.  Sweets and snack foods, such as cake, cookies, candy, chips, soft drinks, and fruit drinks. CARBOHYDRATE COUNTING There are two ways to count the carbohydrates in your food. You can use either of the methods or a combination of both. Reading the "Nutrition Facts" on [place] The "Nutrition Facts" is an area that is included  on the labels of almost all packaged food and beverages in the [place]. It includes the serving size of that food or beverage and information about the nutrients in each serving of the food, including the grams (g) of carbohydrate per serving.  Decide the number of servings of this food or beverage that you will be able to eat or drink. Multiply that number of servings by the number of grams of carbohydrate that is listed on the label for that serving. The total will be the amount of carbohydrates you will be having when you eat or drink this food or beverage. Learning Standard Serving Sizes of Food When you eat food that is not packaged or does not include "Nutrition Facts" on the label, you need to measure the servings in order to count the amount of carbohydrates.A serving of most carbohydrate-rich foods contains about 15 g of carbohydrates. The following list includes serving sizes of carbohydrate-rich foods that provide 15 g ofcarbohydrate per serving:   1 slice of bread (1 oz) or 1 six-inch tortilla.    of a hamburger bun or English muffin.  4-6 crackers.   cup unsweetened dry cereal.    cup hot cereal.   cup rice or pasta.    cup mashed potatoes or  of a large baked potato.  1 cup fresh fruit or one small piece of fruit.    cup canned or frozen fruit or fruit juice.  1 cup milk.   cup plain fat-free yogurt or  yogurt sweetened with artificial sweeteners.   cup cooked dried beans or starchy vegetable, such as peas, corn, or potatoes.  Decide the number of standard-size servings that you will eat. Multiply that number of servings by 15 (the grams of carbohydrates in that serving). For example, if you eat 2 cups of strawberries, you will have eaten 2 servings and 30 g of carbohydrates (2 servings x 15 g = 30 g). For foods such as soups and casseroles, in which more than one food is mixed in, you will need to count the carbohydrates in each food that is  included. EXAMPLE OF CARBOHYDRATE COUNTING Sample Dinner  3 oz chicken breast.   cup of brown rice.   cup of corn.  1 cup milk.   1 cup strawberries with sugar-free whipped topping.  Carbohydrate Calculation Step 1: Identify the foods that contain carbohydrates:   Rice.   Corn.   Milk.   Strawberries. Step 2:Calculate the number of servings eaten of each:   2 servings of rice.   1 serving of corn.   1 serving of milk.   1 serving of strawberries. Step 3: Multiply each of those number of servings by 15 g:   2 servings of rice x 15 g = 30 g.   1 serving of corn x 15 g = 15 g.   1 serving of milk x 15 g = 15 g.   1 serving of strawberries x 15 g = 15 g. Step 4: Add together all of the amounts to find the total grams of carbohydrates eaten: 30 g + 15 g + 15 g + 15 g = 75 g. Document Released: 01/21/2005 Document Revised: 06/07/2013 Document Reviewed: 12/18/2012 [facility] Patient Information 2015 [place], [name] This information is not intended to replace advice given to you by your health care provider. Make sure you discuss any questions you have with your health care provider.

## 2014-04-04 ENCOUNTER — Other Ambulatory Visit: Payer: Self-pay | Admitting: *Deleted

## 2014-04-04 MED ORDER — MOMETASONE FUROATE 50 MCG/ACT NA SUSP: 2.0000 | Freq: Every day | NASAL | Status: DC | PRN

## 2014-04-05 ENCOUNTER — Ambulatory Visit: Payer: BC Managed Care – PPO | Admitting: Family Medicine

## 2014-04-08 ENCOUNTER — Emergency Department (INDEPENDENT_AMBULATORY_CARE_PROVIDER_SITE_OTHER)
Admission: EM | Admit: 2014-04-08 | Discharge: 2014-04-08 | Disposition: A | Discharge status: Home / Self Care | Payer: Medicare Other | Source: Home / Self Care | Attending: Family Medicine | Admitting: Family Medicine

## 2014-04-08 ENCOUNTER — Encounter (HOSPITAL_COMMUNITY): Payer: Self-pay | Admitting: Emergency Medicine

## 2014-04-08 DIAGNOSIS — H6981 Other specified disorders of Eustachian tube, right ear: Secondary | ICD-10-CM | POA: Diagnosis not present

## 2014-04-08 DIAGNOSIS — H9201 Otalgia, right ear: Secondary | ICD-10-CM | POA: Diagnosis not present

## 2014-04-08 MED ORDER — FEXOFENADINE HCL 180 MG PO TABS: 180.0000 mg | ORAL_TABLET | Freq: Every day | ORAL | Status: DC

## 2014-04-08 MED ORDER — FLUTICASONE PROPIONATE 50 MCG/ACT NA SUSP: 2.0000 | Freq: Two times a day (BID) | NASAL | Status: DC

## 2014-04-08 NOTE — ED Provider Notes (Signed)
CSN: [id number]     Arrival date & time 04/08/14  1242 History   First MD Initiated Contact with Patient 04/08/14 1359     Chief Complaint  Patient presents with  . Otalgia  . Facial Pain   (Consider location/radiation/quality/duration/timing/severity/associated sxs/prior Treatment) HPI         74 year old female presents for evaluation of right side face and ear pain. This initially started 5 days ago. She has also had a bit of nasal congestion. No drainage from the ear, fever, chills, NVD, chest pain, shortness of breath. No recent travel or sick contacts. No cardiac history  Past Medical History  Diagnosis Date  . Diabetes mellitus   . Hypertension   . Arthritis   . Back pain   . Hyperlipidemia   . Palpitations    Past Surgical History  Procedure Laterality Date  . Colon surgery    . Abdominal hysterectomy     Family History  Problem Relation Age of Onset  . Pancreatic cancer Sister     3 sisters died of it  . Heart attack Father 30    died  . Diabetes Mother   . Cancer Mother   . Hypertension Son   . Hypertension Daughter    History  Substance Use Topics  . Smoking status: Never Smoker   . Smokeless tobacco: Former Systems developer    Quit date: 02/05/1983  . Alcohol Use: No   OB History    No data available     Review of Systems  HENT: Positive for ear pain.   All other systems reviewed and are negative.   Allergies  Amoxicillin; Codeine; Penicillins; Sulfamethoxazole; and Sulfonamide derivatives  Home Medications   Prior to Admission medications   Medication Sig Start Date End Date Taking? Authorizing Provider  aspirin EC 81 MG tablet Take 81 mg by mouth daily.    Historical Provider, MD  fexofenadine (ALLEGRA ALLERGY) 180 MG tablet Take 1 tablet (180 mg total) by mouth daily. 04/08/14   Freeman Caldron Jase Reep, PA-C  fluticasone (FLONASE) 50 MCG/ACT nasal spray Place 2 sprays into both nostrils 2 (two) times daily. Decrease to 2 sprays/nostril daily after 5 days 04/08/14    Liam Graham, PA-C  glucose blood (ACCU-CHEK INSTANT GLUCOSE TEST) test strip Use as instructed. Test blood sugar twice daily 12/13/10   Lyndee Hensen, MD  losartan-hydrochlorothiazide [facility]) 100-12.5 MG per tablet TAKE 1 TABLET BYMOUTH ONCE DAILY    Gerda Diss, DO  metFORMIN (GLUCOPHAGE) 500 MG tablet Take 1 tablet (500 mg total) by mouth 2 (two) times daily with a meal. 03/23/14   Janora Norlander, DO  metoprolol (LOPRESSOR) 50 MG tablet take 1 tablet by mouth twice a day    Gerda Diss, DO  mometasone (NASONEX) 50 MCG/ACT nasal spray Place 2 sprays into the nose daily as needed. For allergy symptoms 04/04/14   Janora Norlander, DO  Multiple Vitamins-Minerals (WOMENS ONE DAILY) TABS Take 1 tablet by mouth daily.    Historical Provider, MD  Polyethyl Glycol-Propyl Glycol (SYSTANE OP) Place 2 drops into both eyes 2 (two) times daily.     Historical Provider, MD  pravastatin (PRAVACHOL) 40 MG tablet Take 1 tablet (40 mg total) by mouth daily. 02/15/14   Ashly Windell Moulding, DO  timolol (BETIMOL) 0.5 % ophthalmic solution Place 1 drop into the right eye 2 (two) times daily. 04/12/13   Gerda Diss, DO  zoster vaccine live, PF, (ZOSTAVAX) 78295 UNT/0.65ML injection Inject 19,400 Units  into the skin once. 03/23/14   Ashly M Gottschalk, DO   BP 183/70 mmHg  Pulse 61  Temp(Src) 97.7 F (36.5 C) (Oral)  Resp 14  SpO2 100% Physical Exam  Constitutional: She is oriented to person, place, and time. Vital signs are normal. She appears well-developed and well-nourished. No distress.  HENT:  Head: Normocephalic and atraumatic.  Right Ear: Hearing, tympanic membrane, external ear and ear canal normal.  Left Ear: Hearing, tympanic membrane, external ear and ear canal normal.  Moderate tenderness in the area of the right eustachian tubes  Pulmonary/Chest: Effort normal. No respiratory distress.  Lymphadenopathy:       Head (right side): No tonsillar adenopathy present.       Head (left  side): No tonsillar adenopathy present.    She has no cervical adenopathy.  Neurological: She is alert and oriented to person, place, and time. She has normal strength. Coordination normal.  Skin: Skin is warm and dry. No rash noted. She is not diaphoretic.  Psychiatric: She has a normal mood and affect. Judgment normal.  Nursing note and vitals reviewed.   ED Course  ED EKG  Date/Time: 04/08/2014 2:46 PM Performed by: Allena Katz, H Authorized by: Allena Katz, H Comparison: compared with previous ECG  Similar to previous ECG Rhythm: sinus rhythm and sinus bradycardia Rate: normal QRS axis: normal Conduction: conduction normal ST Segments: ST segments normal T Waves: T waves normal Other: no other findings Clinical impression: normal ECG Comments: Diffuse T-wave flattening, unchanged from previous EKGs   (including critical care time) Labs Review Labs Reviewed - No data to display  Imaging Review No results found.   MDM   1. Otalgia, right   2. ETD (eustachian tube dysfunction), right    EKG is normal. Treat for allergies. Also she may take Tylenol for pain. Follow-up with primary care.   Meds ordered this encounter  Medications  . fexofenadine (ALLEGRA ALLERGY) 180 MG tablet    Sig: Take 1 tablet (180 mg total) by mouth daily.    Dispense:  10 tablet    Refill:  0  . fluticasone (FLONASE) 50 MCG/ACT nasal spray    Sig: Place 2 sprays into both nostrils 2 (two) times daily. Decrease to 2 sprays/nostril daily after 5 days    Dispense:  16 g    Refill:  2       Liam Graham, PA-C 04/08/14 1450

## 2014-04-08 NOTE — Discharge Instructions (Signed)
Otalgia  The most common reason for this in children is an infection of the middle ear. Pain from the middle ear is usually caused by a build-up of fluid and pressure behind the eardrum. Pain from an earache can be sharp, dull, or burning. The pain may be temporary or constant. The middle ear is connected to the nasal passages by a short narrow tube called the Eustachian tube. The Eustachian tube allows fluid to drain out of the middle ear, and helps keep the pressure in your ear equalized.  CAUSES   A cold or allergy can block the Eustachian tube with inflammation and the build-up of secretions. This is especially likely in small children, because their Eustachian tube is shorter and more horizontal. When the Eustachian tube closes, the normal flow of fluid from the middle ear is stopped. Fluid can accumulate and cause stuffiness, pain, hearing loss, and an ear infection if germs start growing in this area.  SYMPTOMS   The symptoms of an ear infection may include fever, ear pain, fussiness, increased crying, and irritability. Many children will have temporary and minor hearing loss during and right after an ear infection. Permanent hearing loss is rare, but the risk increases the more infections a child has. Other causes of ear pain include retained water in the outer ear canal from swimming and bathing.  Ear pain in adults is less likely to be from an ear infection. Ear pain may be referred from other locations. Referred pain may be from the joint between your jaw and the skull. It may also come from a tooth problem or problems in the neck. Other causes of ear pain include:   A foreign body in the ear.   Outer ear infection.   Sinus infections.   Impacted ear wax.   Ear injury.   Arthritis of the jaw or TMJ problems.   Middle ear infection.   Tooth infections.   Sore throat with pain to the ears.  DIAGNOSIS   Your caregiver can usually make the diagnosis by examining you. Sometimes other special studies,  including x-rays and lab work may be necessary.  TREATMENT    If antibiotics were prescribed, use them as directed and finish them even if you or your child's symptoms seem to be improved.   Sometimes PE tubes are needed in children. These are little plastic tubes which are put into the eardrum during a simple surgical procedure. They allow fluid to drain easier and allow the pressure in the middle ear to equalize. This helps relieve the ear pain caused by pressure changes.  HOME CARE INSTRUCTIONS    Only take over-the-counter or prescription medicines for pain, discomfort, or fever as directed by your caregiver. DO NOT GIVE CHILDREN ASPIRIN because of the association of Reye's Syndrome in children taking aspirin.   Use a cold pack applied to the outer ear for 15-20 minutes, 03-04 times per day or as needed may reduce pain. Do not apply ice directly to the skin. You may cause frost bite.   Over-the-counter ear drops used as directed may be effective. Your caregiver may sometimes prescribe ear drops.   Resting in an upright position may help reduce pressure in the middle ear and relieve pain.   Ear pain caused by rapidly descending from high altitudes can be relieved by swallowing or chewing gum. Allowing infants to suck on a bottle during airplane travel can help.   Do not smoke in the house or near children. If you are   unable to quit smoking, smoke outside.   Control allergies.  SEEK IMMEDIATE MEDICAL CARE IF:    You or your child are becoming sicker.   Pain or fever relief is not obtained with medicine.   You or your child's symptoms (pain, fever, or irritability) do not improve within 24 to 48 hours or as instructed.   Severe pain suddenly stops hurting. This may indicate a ruptured eardrum.   You or your children develop new problems such as severe headaches, stiff neck, difficulty swallowing, or swelling of the face or around the ear.  Document Released: 09/08/2003 Document Revised: 04/15/2011  Document Reviewed: 01/13/2008  [employer] Patient Information 2015 [employer]. This information is not intended to replace advice given to you by your health care provider. Make sure you discuss any questions you have with your health care provider.

## 2014-04-08 NOTE — ED Notes (Signed)
Patient c/o left side facial and ear pain x 5 days. Patient reports she has taken Tylenol with no relief. Patient is in NAD.

## 2014-04-13 ENCOUNTER — Telehealth: Payer: Self-pay | Admitting: Family Medicine

## 2014-04-13 NOTE — Telephone Encounter (Signed)
Face won't stop hurting would like to speak with someone about what to do / thanks sr

## 2014-04-20 ENCOUNTER — Other Ambulatory Visit: Payer: Self-pay | Admitting: *Deleted

## 2014-04-20 MED ORDER — LOSARTAN POTASSIUM-HCTZ 100-12.5 MG PO TABS: ORAL_TABLET | ORAL | Status: DC

## 2014-04-20 MED ORDER — METOPROLOL TARTRATE 50 MG PO TABS: 50.0000 mg | ORAL_TABLET | Freq: Two times a day (BID) | ORAL | Status: DC

## 2014-05-04 ENCOUNTER — Ambulatory Visit (INDEPENDENT_AMBULATORY_CARE_PROVIDER_SITE_OTHER): Payer: Medicare Other | Admitting: Family Medicine

## 2014-05-04 ENCOUNTER — Encounter: Payer: Self-pay | Admitting: Family Medicine

## 2014-05-04 VITALS — BP 179/63 | HR 60 | Temp 98.2°F | Ht 62.0 in | Wt 192.3 lb

## 2014-05-04 DIAGNOSIS — M791 Myalgia: Secondary | ICD-10-CM

## 2014-05-04 DIAGNOSIS — M7912 Myalgia of auxiliary muscles, head and neck: Secondary | ICD-10-CM | POA: Insufficient documentation

## 2014-05-04 MED ORDER — NAPROXEN 500 MG PO TABS: 500.0000 mg | ORAL_TABLET | Freq: Two times a day (BID) | ORAL | Status: DC

## 2014-05-04 MED ORDER — CYCLOBENZAPRINE HCL 5 MG PO TABS: 5.0000 mg | ORAL_TABLET | Freq: Every evening | ORAL | Status: DC | PRN

## 2014-05-04 NOTE — Progress Notes (Signed)
Amber [name] is a 74 y.o. female who presents today for R neck pain.  R neck Pain - ongoing now for about 4 weeks, denies inciting injury or previous injury to this area.  Pain is dull achy, does become sharp with movement with rotation to the R side.  TTP along the muscle belly.  Has taken tylenol with some improvement.  Seen in urgent care about 2 weeks ago, dx with URI, taking OTC medication w/o improvement.  Denies any fever, chills, sweats, weight loss, fatigue.  Pain worse at night when sleeping on this.  Has not tried heating pad or stretches   Current Outpatient Prescriptions on File Prior to Visit  Medication Sig Dispense Refill  . aspirin EC 81 MG tablet Take 81 mg by mouth daily.    . fexofenadine (ALLEGRA ALLERGY) 180 MG tablet Take 1 tablet (180 mg total) by mouth daily. 10 tablet 0  . fluticasone (FLONASE) 50 MCG/ACT nasal spray Place 2 sprays into both nostrils 2 (two) times daily. Decrease to 2 sprays/nostril daily after 5 days 16 g 2  . glucose blood (ACCU-CHEK INSTANT GLUCOSE TEST) test strip Use as instructed. Test blood sugar twice daily 100 each 0  . losartan-hydrochlorothiazide (HYZAAR) 100-12.5 MG per tablet TAKE 1 TABLET BYMOUTH ONCE DAILY 90 tablet 0  . metFORMIN (GLUCOPHAGE) 500 MG tablet Take 1 tablet (500 mg total) by mouth 2 (two) times daily with a meal. 60 tablet 4  . metoprolol (LOPRESSOR) 50 MG tablet Take 1 tablet (50 mg total) by mouth 2 (two) times daily. 180 tablet 0  . mometasone (NASONEX) 50 MCG/ACT nasal spray Place 2 sprays into the nose daily as needed. For allergy symptoms 17 g 5  . Multiple Vitamins-Minerals (WOMENS ONE DAILY) TABS Take 1 tablet by mouth daily.    Vladimir Faster Glycol-Propyl Glycol (SYSTANE OP) Place 2 drops into both eyes 2 (two) times daily.     . pravastatin (PRAVACHOL) 40 MG tablet Take 1 tablet (40 mg total) by mouth daily. 30 tablet 5  . timolol (BETIMOL) 0.5 % ophthalmic solution Place 1 drop into the right eye 2 (two) times  daily. 10 mL 12  . zoster vaccine live, PF, (ZOSTAVAX) 64383 UNT/0.65ML injection Inject 19,400 Units into the skin once. 1 each 0   No current facility-administered medications on file prior to visit.    ROS: Per HPI.  All other systems reviewed and are negative.   Physical Exam Filed Vitals:   05/04/14 1345  BP: 179/63  Pulse: 60  Temp: 98.2 F (36.8 C)    Physical Examination: General appearance - alert, well appearing, and in no distress Neck - + hypertonic R SCM, TTP at mastoid insertion, ROM limited with SB/R to the R.  Neurovascularly intact B/L UE  Lymphatics - no palpable lymphadenopathy

## 2014-05-04 NOTE — Assessment & Plan Note (Signed)
Exam c/w muscle spasm of SCM - Start Naproxen 500 mg BID for 10 days - Heat to area along with gentle stretches - Flexeril 5 mg qhs PRN for pain - F/U in 2 weeks if no improvement

## 2014-05-12 DIAGNOSIS — H34811 Central retinal vein occlusion, right eye: Secondary | ICD-10-CM | POA: Diagnosis not present

## 2014-06-16 DIAGNOSIS — H40053 Ocular hypertension, bilateral: Secondary | ICD-10-CM | POA: Diagnosis not present

## 2014-06-23 DIAGNOSIS — H34811 Central retinal vein occlusion, right eye: Secondary | ICD-10-CM | POA: Diagnosis not present

## 2014-07-01 ENCOUNTER — Ambulatory Visit (INDEPENDENT_AMBULATORY_CARE_PROVIDER_SITE_OTHER): Payer: Medicare Other | Admitting: Family Medicine

## 2014-07-01 ENCOUNTER — Encounter: Payer: Self-pay | Admitting: Family Medicine

## 2014-07-01 VITALS — BP 159/66 | HR 57 | Temp 97.7°F | Ht 62.0 in | Wt 194.0 lb

## 2014-07-01 DIAGNOSIS — E119 Type 2 diabetes mellitus without complications: Secondary | ICD-10-CM

## 2014-07-01 DIAGNOSIS — Z299 Encounter for prophylactic measures, unspecified: Secondary | ICD-10-CM

## 2014-07-01 DIAGNOSIS — M791 Myalgia: Secondary | ICD-10-CM

## 2014-07-01 DIAGNOSIS — Z418 Encounter for other procedures for purposes other than remedying health state: Secondary | ICD-10-CM

## 2014-07-01 DIAGNOSIS — I10 Essential (primary) hypertension: Secondary | ICD-10-CM

## 2014-07-01 DIAGNOSIS — M7912 Myalgia of auxiliary muscles, head and neck: Secondary | ICD-10-CM

## 2014-07-01 LAB — POCT GLYCOSYLATED HEMOGLOBIN (HGB A1C): HEMOGLOBIN A1C: 6.4

## 2014-07-01 NOTE — Patient Instructions (Signed)
It was a pleasure seeing you today, Ms Rockers!  Information regarding what we discussed is included in this packet.  Please make an appointment to see me in 3 months for diabetes follow up and cholesterol testing.  Please feel free to call our office at [phone] if any questions or concerns arise.  Warm Regards, Buford Gayler M. Lajuana Ripple, DO

## 2014-07-01 NOTE — Progress Notes (Signed)
Patient is due for a screening mammogram.  Information on imaging center given to patient to call and schedule mammogram appointment.  Fleeger, Jessica Dawn, CMA   

## 2014-07-02 ENCOUNTER — Encounter: Payer: Self-pay | Admitting: Family Medicine

## 2014-07-02 NOTE — Assessment & Plan Note (Signed)
Patient encouraged to use heat PRN. Neck exercises didn't make it on AVS.  Called patient at home to inform her that these would be mailed to her.  Decreased AROM but otherwise no PE findings. -Patient to follow up sooner than July if no improvement in neck pain or any red flag symptoms which were reviewed with patient during appointment

## 2014-07-02 NOTE — Progress Notes (Signed)
Patient ID: Amber [name], female   DOB: 1940-11-12, 74 y.o.   MRN: [id number]   [name] presents to office today for her annual physical examination.  Concerns today include:  1. Neck pain She reports that she has had ongoing neck pain for several weeks now.  She was given a muscle relaxer by one of our other providers that did not seem to help much.  She notes that the pain is worse when turning her head to the Left, relieved by warm showers.  Denies any numbness, tingling, weakness, or injury.  2. Diabetes:  High at home: 186 Low at home: 42 Taking medications: Metformin Side effects: none ROS: denies fever, chills, dizziness, LOC, polyuria, polydipsia, numbness or tingling in extremities or chest pain. Last foot exam: unsure, will perform today Last A1c: 5.7 Nephropathy screen indicated?: on ARB Last flu, zoster and/or pneumovax: zoster needed  Last colonoscopy: not in several years Last mammogram: >1 year Immunizations needed: Zoster Refills needed today: none  Past Medical History  Diagnosis Date  . Diabetes mellitus   . Hypertension   . Arthritis   . Back pain   . Hyperlipidemia   . Palpitations    History   Social History  . Marital Status: Widowed    Spouse Name: N/A  . Number of Children: N/A  . Years of Education: N/A   Occupational History  . Not on file.   Social History Main Topics  . Smoking status: Never Smoker   . Smokeless tobacco: Former Systems developer    Quit date: 02/05/1983  . Alcohol Use: No  . Drug Use: No  . Sexual Activity: No   Other Topics Concern  . 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  5 children, lives alone.   Pt exercises by walking every other day.         Past Surgical History  Procedure Laterality Date  . Colon surgery    . Abdominal hysterectomy     Family History  Problem Relation Age of Onset  . Pancreatic  cancer Sister     3 sisters died of it  . Heart attack Father 29    died  . Diabetes Mother   . Cancer Mother   . Hypertension Son   . Congestive Heart Failure Son   . Hypertension Daughter     ROS: Review of Systems Constitutional: negative Eyes: negative Ears, nose, mouth, throat, and face: negative Respiratory: negative Cardiovascular: negative Gastrointestinal: negative Genitourinary:negative Integument/breast: negative Hematologic/lymphatic: negative Musculoskeletal:positive for neck pain Neurological: negative Behavioral/Psych: negative Endocrine: negative Allergic/Immunologic: negative   Physical exam BP 159/66 mmHg  Pulse 57  Temp(Src) 97.7 F (36.5 C) (Oral)  Ht 5\' 2"  (1.575 m)  Wt 194 lb (87.998 kg)  BMI 35.47 kg/m2 General appearance: alert, cooperative, appears stated age and no distress Head: Normocephalic, without obvious abnormality, atraumatic Eyes: conjunctivae/corneas clear. PERRL, EOM's intact. Fundi benign. Ears: external auditory canals normal Nose: Nares normal. Septum midline. Mucosa normal. No drainage or sinus tenderness. Throat: lips, mucosa, and tongue normal; teeth and gums normal Neck: no adenopathy, no carotid bruit, no JVD, supple, symmetrical, trachea midline, thyroid not enlarged, symmetric, no tenderness/mass/nodules and AROM decreased in rotation to L. PROM with mild increase in ROM.  No bony abnormalities, no midline TTP, +increase tone in R trapezius, no erythema Back: no tenderness to percussion or palpation, range of  motion normal, symmetric, no curvature. ROM normal. No CVA tenderness. Lungs: clear to auscultation bilaterally Heart: regular rate and rhythm, S1, S2 normal, no murmur, click, rub or gallop Abdomen: soft, non-tender; bowel sounds normal; no masses,  no organomegaly Extremities: extremities normal, atraumatic, no cyanosis or edema and no ulcers, gangrene or trophic changes Pulses: 2+ and symmetric Skin: Skin color,  texture, turgor normal. No rashes or lesions Lymph nodes: Cervical, supraclavicular, and axillary nodes normal. Neurologic: Alert and oriented X 3, normal strength and tone. Normal symmetric reflexes. Normal coordination and gait   Diabetic Foot Exam - detailed   Diabetic Foot exam was performed with the following findings: Yes 07/01/2014 3:22 PM   Is there a history of foot ulcer?: No   Can the patient see the bottom of their feet?: Yes   Are the shoes appropriate in style and fit?: Yes   Is there swelling or and abnormal foot shape?: No   Are the toenails long?: Yes   Are the toenails thick?: Yes   Do you have pain in calf while walking?: No   Is there a claw toe deformity?: No   Is there elevated skin temparature?: No   Is there limited skin dorsiflexion?: No   Is there foot or ankle muscle weakness?: No   Are the toenails ingrown?: No   Normal Range of Motion: Yes      Right posterior Tibialias: Present Left posterior Tibialias: Present   Right Dorsalis Pedis: Present Left Dorsalis Pedis: Present   Semmes-Weinstein Monofilament Test   R Foot Test Control: Pos L Foot Test Control: Pos   R Site 1-Great Toe: Pos L Site 1-Great Toe: Pos   R Site 4: Pos L Site 4: Pos   R Site 5: Pos L Site 5: Pos     Comments: Monofilament testing normal. No deficits in sensation. Pulses palpable. No ulcerations or callus noted. Toenails thick and long   A1c 6.4  Assessment: Patient here for annual physical exam.  H/o several co-morbidities but exam WNL  Plan: Please see problem list for individual plan.  Patient is due for a screening mammogram.  Information on imaging center given to patient to call and schedule mammogram appointment.    Amber [name] M. Lajuana Ripple, DO PGY-1, [place]

## 2014-07-02 NOTE — Assessment & Plan Note (Signed)
Systolic BP initially elevated to 159.  BP was repeated but for some reason not recorded.  BP systolic was in the 711'A before discharge from clinic. -Continue home BP medication regimen as directed.

## 2014-07-02 NOTE — Assessment & Plan Note (Signed)
Patient is due for a screening mammogram.  Information on imaging center given to patient to call and schedule mammogram appointment.   Information also given for colonoscopy.  Zostavax encouraged.

## 2014-07-02 NOTE — Assessment & Plan Note (Signed)
A1c 6.4 today.  At goal. -Continue Metformin at 500 mg BID -DM foot exam performed today -Repeat A1c in 6 months. -On ARB

## 2014-07-14 ENCOUNTER — Ambulatory Visit: Payer: BC Managed Care – PPO

## 2014-07-19 ENCOUNTER — Other Ambulatory Visit: Payer: Self-pay | Admitting: *Deleted

## 2014-07-19 MED ORDER — METOPROLOL TARTRATE 50 MG PO TABS
50.0000 mg | ORAL_TABLET | Freq: Two times a day (BID) | ORAL | Status: DC
Start: 2014-07-19 — End: 2015-02-27

## 2014-07-19 MED ORDER — LOSARTAN POTASSIUM-HCTZ 100-12.5 MG PO TABS: ORAL_TABLET | ORAL | Status: DC

## 2014-07-20 ENCOUNTER — Ambulatory Visit
Admission: RE | Admit: 2014-07-20 | Discharge: 2014-07-20 | Disposition: A | Discharge status: Home / Self Care | Payer: Medicare Other | Source: Ambulatory Visit

## 2014-07-20 DIAGNOSIS — Z1231 Encounter for screening mammogram for malignant neoplasm of breast: Secondary | ICD-10-CM

## 2014-08-02 DIAGNOSIS — H34811 Central retinal vein occlusion, right eye: Secondary | ICD-10-CM | POA: Diagnosis not present

## 2014-08-22 ENCOUNTER — Other Ambulatory Visit: Payer: Self-pay | Admitting: *Deleted

## 2014-08-22 DIAGNOSIS — E119 Type 2 diabetes mellitus without complications: Secondary | ICD-10-CM

## 2014-08-22 MED ORDER — METFORMIN HCL 500 MG PO TABS: 500.0000 mg | ORAL_TABLET | Freq: Two times a day (BID) | ORAL | Status: DC

## 2014-08-25 ENCOUNTER — Encounter: Payer: Self-pay | Admitting: Family Medicine

## 2014-08-25 ENCOUNTER — Ambulatory Visit (INDEPENDENT_AMBULATORY_CARE_PROVIDER_SITE_OTHER): Payer: Medicare Other | Admitting: Family Medicine

## 2014-08-25 VITALS — BP 176/69 | HR 58 | Temp 98.0°F | Ht 60.0 in | Wt 193.9 lb

## 2014-08-25 DIAGNOSIS — E1169 Type 2 diabetes mellitus with other specified complication: Secondary | ICD-10-CM | POA: Diagnosis not present

## 2014-08-25 DIAGNOSIS — E785 Hyperlipidemia, unspecified: Secondary | ICD-10-CM | POA: Diagnosis not present

## 2014-08-25 DIAGNOSIS — E119 Type 2 diabetes mellitus without complications: Secondary | ICD-10-CM

## 2014-08-25 DIAGNOSIS — D509 Iron deficiency anemia, unspecified: Secondary | ICD-10-CM | POA: Diagnosis not present

## 2014-08-25 DIAGNOSIS — R05 Cough: Secondary | ICD-10-CM | POA: Diagnosis not present

## 2014-08-25 DIAGNOSIS — R002 Palpitations: Secondary | ICD-10-CM

## 2014-08-25 DIAGNOSIS — I10 Essential (primary) hypertension: Secondary | ICD-10-CM | POA: Diagnosis not present

## 2014-08-25 DIAGNOSIS — R059 Cough, unspecified: Secondary | ICD-10-CM | POA: Insufficient documentation

## 2014-08-25 MED ORDER — BENZONATATE 200 MG PO CAPS: 200.0000 mg | ORAL_CAPSULE | Freq: Two times a day (BID) | ORAL | Status: DC | PRN

## 2014-08-25 NOTE — Assessment & Plan Note (Signed)
Repeat SBP156.  -Will increase HCTZ portion of medication if continues to be elevated at next visit.  -CMET ordered -Follow up with cardiology.

## 2014-08-25 NOTE — Assessment & Plan Note (Signed)
Lipid panel ordered -continue statin

## 2014-08-25 NOTE — Progress Notes (Signed)
Patient ID: Amber [name], female   DOB: January 05, 1941, 74 y.o.   MRN: [id number]    Subjective: CC: feeling poor/ cough HPI: Patient is a 74 y.o. female presenting to clinic today for follow up visit. Concerns today include:  1. Cough/ SOB Patient reports that she has been coughing since Saturday.  She reports coughing up Yellow mucus.  No fevers.  She also reports SOB on Tues that resolved after taking aspirin.  SOB was described as a catching sensation after activity outside.  She reports that it only happened Tuesday and has not recurred.  She reports that at that time she also felt like her heart was skipping beats.  Endorses dizziness at that time that has also resolved.  She reports she called her cardiologist who did not think that symptoms were of cardiac etiology.  She has not seen cardiology since last year.  Had similar symptoms last year and was put on a holter monitor, which did not show anything abnormal.  Sees Amber [name].  Denies CP, vomiting, diaphoresis, SOB now, palpitations now, visual disturbance.  Patient reports Amber [name] was sick with URI.  Denies sore throat.  Endorses Runny nose, itchy eyes.  2. Hypertension Blood pressure at home: doesn't monitor at home Blood pressure today: 176/69 Meds: Compliant with Hyzaar Side effects: none ROS: CURRENTLY Denies headache, dizziness, visual changes, vomiting, chest pain, abdominal pain or shortness of breath.  3.  Diabetes:  High at home: 130 Low at home: 63 Taking medications: Metformin 500 mg twice daily  Side effects: none ROS: denies fever, chills, dizziness, LOC, polyuria, polydipsia, numbness or tingling in extremities or chest pain. Last eye exam: 6 weeks ago Last foot exam: 07/01/14 Last A1c: 6.4 Nephropathy screen indicated?: on ARB Last flu, zoster and/or pneumovax: UTD  Social History Reviewed: non smoker. FamHx and MedHx updated.  Please see EMR.  ROS: All other systems reviewed and are  negative.  Objective: Office vital signs reviewed. BP 176/69 mmHg  Pulse 58  Temp(Src) 98 F (36.7 C) (Oral)  Ht 5' (1.524 m)  Wt 193 lb 14.4 oz (87.952 kg)  BMI 37.87 kg/m2  Physical Examination:  General: Awake, alert, well nourished, NAD HEENT: Normal    Neck: No masses palpated. No LAD    Ears: TMs intact, normal light reflex, no erythema, no bulging    Eyes: PERRLA, EOMI    Nose: nasal turbinates moist    Throat: MMM, no erythema Cardio: RRR, S1S2 heard, no murmurs appreciated Pulm: CTAB, no wheezes, rhonchi or rales, no increased WOB Extremities: WWP, No edema, cyanosis or clubbing; +2 pulses bilaterally MSK: Normal gait and station Skin: dry, intact, no rashes or lesions  Mammogram reviewed with patient.  Assessment: 74 y.o. female with cough, HTN, T2DM  Plan: See Problem List and After Visit Summary   Janora Norlander, DO PGY-2, [place]

## 2014-08-25 NOTE — Patient Instructions (Signed)
It was a pleasure seeing you today, Ms Amber [name].  Information regarding what we discussed is included in this packet.  Please make an appointment with the lab for FASTING labs after 09/02/14.  This is when your insurance will pay again.  If you develop shortness of breath, fevers, chills, vomiting, chest pain, I want you to be seen.  If your symptoms do not resolve in the next week, I want you to come see me.  Otherwise, plan to follow up in 3 months for your diabetes.  Amber [name] has been called into the pharmacy for your cough.  Please feel free to call our office at [phone] if any questions or concerns arise.  Warm Regards, Ashly M. Gottschalk, DO    Cough, Adult  A cough is a reflex. It helps you clear your throat and airways. A cough can help heal your body. A cough can last 2 or 3 weeks (acute) or may last more than 8 weeks (chronic). Some common causes of a cough can include an infection, allergy, or a cold. HOME CARE  Only take medicine as told by your doctor.  If given, take your medicines (antibiotics) as told. Finish them even if you start to feel better.  Use a cold steam vaporizer or humidifier in your home. This can help loosen thick spit (secretions).  Sleep so you are almost sitting up (semi-upright). Use pillows to do this. This helps reduce coughing.  Rest as needed.  Stop smoking if you smoke. GET HELP RIGHT AWAY IF:  You have yellowish-white fluid (pus) in your thick spit.  Your cough gets worse.  Your medicine does not reduce coughing, and you are losing sleep.  You cough up blood.  You have trouble breathing.  Your pain gets worse and medicine does not help.  You have a fever. MAKE SURE YOU:   Understand these instructions.  Will watch your condition.  Will get help right away if you are not doing well or get worse. Document Released: 10/04/2010 Document Revised: 06/07/2013 Document Reviewed: 10/04/2010 [facility] Patient Information 2015  [place], [name] This information is not intended to replace advice given to you by your health care provider. Make sure you discuss any questions you have with your health care provider.

## 2014-08-25 NOTE — Assessment & Plan Note (Signed)
Continue Metformin. -patient to call with meter brand and will send in testing supplies -repeat a1c 09/2014

## 2014-08-25 NOTE — Assessment & Plan Note (Signed)
Suspect URI vs allergies -Tessalon sent into pharmacy -Return precautions reviewed with patient.  See AVS -Follow up in 3 months or sooner if symptoms not improving.

## 2014-09-05 ENCOUNTER — Other Ambulatory Visit: Payer: Medicare Other

## 2014-09-05 DIAGNOSIS — E1169 Type 2 diabetes mellitus with other specified complication: Secondary | ICD-10-CM | POA: Diagnosis not present

## 2014-09-05 DIAGNOSIS — D509 Iron deficiency anemia, unspecified: Secondary | ICD-10-CM

## 2014-09-05 DIAGNOSIS — R002 Palpitations: Secondary | ICD-10-CM | POA: Diagnosis not present

## 2014-09-05 DIAGNOSIS — E785 Hyperlipidemia, unspecified: Secondary | ICD-10-CM | POA: Diagnosis not present

## 2014-09-05 DIAGNOSIS — I10 Essential (primary) hypertension: Secondary | ICD-10-CM | POA: Diagnosis not present

## 2014-09-05 LAB — CBC
HCT: 35.7 % — ABNORMAL LOW (ref 36.0–46.0)
Hemoglobin: 11.6 g/dL — ABNORMAL LOW (ref 12.0–15.0)
MCH: 25.2 pg — ABNORMAL LOW (ref 26.0–34.0)
MCHC: 32.5 g/dL (ref 30.0–36.0)
MCV: 77.4 fL — AB (ref 78.0–100.0)
MPV: 9.4 fL (ref 8.6–12.4)
Platelets: 309 10*3/uL (ref 150–400)
RBC: 4.61 MIL/uL (ref 3.87–5.11)
RDW: 14.4 % (ref 11.5–15.5)
WBC: 6.9 10*3/uL (ref 4.0–10.5)

## 2014-09-05 LAB — COMPREHENSIVE METABOLIC PANEL
ALBUMIN: 3.8 g/dL (ref 3.6–5.1)
ALK PHOS: 54 U/L (ref 33–130)
ALT: 11 U/L (ref 6–29)
AST: 15 U/L (ref 10–35)
BILIRUBIN TOTAL: 0.4 mg/dL (ref 0.2–1.2)
BUN: 13 mg/dL (ref 7–25)
CALCIUM: 9.3 mg/dL (ref 8.6–10.4)
CO2: 29 mmol/L (ref 20–31)
Chloride: 103 mmol/L (ref 98–110)
Creat: 0.85 mg/dL (ref 0.60–0.93)
GLUCOSE: 117 mg/dL — AB (ref 65–99)
POTASSIUM: 3.8 mmol/L (ref 3.5–5.3)
Sodium: 142 mmol/L (ref 135–146)
Total Protein: 7.3 g/dL (ref 6.1–8.1)

## 2014-09-05 LAB — LIPID PANEL
Cholesterol: 164 mg/dL (ref 125–200)
HDL: 48 mg/dL (ref >=46)
LDL Cholesterol: 89 mg/dL (ref <130)
Total CHOL/HDL Ratio: 3.4 Ratio (ref <=5.0)
Triglycerides: 135 mg/dL (ref <150)
VLDL: 27 mg/dL (ref <30)

## 2014-09-05 LAB — TSH: TSH: 3.095 u[IU]/mL (ref 0.350–4.500)

## 2014-09-05 NOTE — Progress Notes (Signed)
CMP,FLP,TSH AND CBC DONE TODAY Danya Spearman 

## 2014-09-06 ENCOUNTER — Encounter: Payer: Self-pay | Admitting: Family Medicine

## 2014-09-09 ENCOUNTER — Ambulatory Visit: Payer: Medicare Other | Admitting: Cardiovascular Disease

## 2014-09-15 ENCOUNTER — Ambulatory Visit (HOSPITAL_COMMUNITY)
Admission: RE | Admit: 2014-09-15 | Discharge: 2014-09-15 | Disposition: A | Discharge status: Home / Self Care | Payer: Medicare Other | Source: Ambulatory Visit | Attending: Family Medicine | Admitting: Family Medicine

## 2014-09-15 ENCOUNTER — Telehealth: Payer: Self-pay | Admitting: Family Medicine

## 2014-09-15 ENCOUNTER — Ambulatory Visit (INDEPENDENT_AMBULATORY_CARE_PROVIDER_SITE_OTHER): Payer: Medicare Other | Admitting: Family Medicine

## 2014-09-15 ENCOUNTER — Encounter: Payer: Self-pay | Admitting: Family Medicine

## 2014-09-15 VITALS — BP 158/69 | HR 56 | Temp 97.9°F | Ht 62.0 in | Wt 194.7 lb

## 2014-09-15 DIAGNOSIS — R059 Cough, unspecified: Secondary | ICD-10-CM

## 2014-09-15 DIAGNOSIS — M549 Dorsalgia, unspecified: Secondary | ICD-10-CM | POA: Diagnosis present

## 2014-09-15 DIAGNOSIS — R110 Nausea: Secondary | ICD-10-CM

## 2014-09-15 DIAGNOSIS — R05 Cough: Secondary | ICD-10-CM

## 2014-09-15 MED ORDER — ONDANSETRON HCL 4 MG PO TABS: 4.0000 mg | ORAL_TABLET | Freq: Three times a day (TID) | ORAL | Status: DC | PRN

## 2014-09-15 NOTE — Patient Instructions (Signed)
Get chest xray.  Nausea medicine has been sent to your pharmacy.  If symptoms get worse come see [name] next week.  Bring a sample of your stool if you are still having diarrhea.  Stay well hydrated and get plenty of rest.  Elleigh Cassetta M. Lajuana Ripple, DO PGY-2, [facility]  Diarrhea Diarrhea is watery poop (stool). It can make you feel weak, tired, thirsty, or give you a dry mouth (signs of dehydration). Watery poop is a sign of another problem, most often an infection. It often lasts 2-3 days. It can last longer if it is a sign of something serious. Take care of yourself as told by your doctor. HOME CARE   Drink 1 cup (8 ounces) of fluid each time you have watery poop.  Do not drink the following fluids:  Those that contain simple sugars (fructose, glucose, galactose, lactose, sucrose, maltose).  Sports drinks.  Fruit juices.  Whole milk products.  Sodas.  Drinks with caffeine (coffee, tea, soda) or alcohol.  Oral rehydration solution may be used if the doctor says it is okay. You may make your own solution. Follow this recipe:   - teaspoon table salt.   teaspoon baking soda.   teaspoon salt substitute containing potassium chloride.  1 tablespoons sugar.  1 liter (34 ounces) of water.  Avoid the following foods:  High fiber foods, such as raw fruits and vegetables.  Nuts, seeds, and whole grain breads and cereals.   Those that are sweetened with sugar alcohols (xylitol, sorbitol, mannitol).  Try eating the following foods:  Starchy foods, such as rice, toast, pasta, low-sugar cereal, oatmeal, baked potatoes, crackers, and bagels.  Bananas.  Applesauce.  Eat probiotic-rich foods, such as yogurt and milk products that are fermented.  Wash your hands well after each time you have watery poop.  Only take medicine as told by your doctor.  Take a warm bath to help lessen burning or pain from having watery poop. GET HELP RIGHT AWAY IF:   You cannot drink fluids  without throwing up (vomiting).  You keep throwing up.  You have blood in your poop, or your poop looks black and tarry.  You do not pee (urinate) in 6-8 hours, or there is only a small amount of very dark pee.  You have belly (abdominal) pain that gets worse or stays in the same spot (localizes).  You are weak, dizzy, confused, or light-headed.  You have a very bad headache.  Your watery poop gets worse or does not get better.  You have a fever or lasting symptoms for more than 2-3 days.  You have a fever and your symptoms suddenly get worse. MAKE SURE YOU:   Understand these instructions.  Will watch your condition.  Will get help right away if you are not doing well or get worse. Document Released: 07/10/2007 Document Revised: 06/07/2013 Document Reviewed: 09/29/2011 [facility] Patient Information 2015 [place], [name] This information is not intended to replace advice given to you by your health care provider. Make sure you discuss any questions you have with your health care provider.

## 2014-09-15 NOTE — Progress Notes (Signed)
Patient ID: Amber [name], female   DOB: 1941-01-09, 74 y.o.   MRN: [id number]    Subjective: CC: feels poorly HPI: Patient is a 74 y.o. female presenting to clinic today for same day appt. Concerns today include:  1. Sick feeling/ cold? About 8 days ago started with upper back and shoulder aching.  She denies productive cough, CP, palpitations, SOB, wheeze, pain with inspiration, fevers, no sick contacts.  Has been taking Tylenol q4 x2-3 days.  Helps some with discomfort.  Last dose 11am.  Taking antihistamine as well.  Endorses night sweats x 8 days.  Developed diarrhea yesterday.  Goes 3-4x/ day.  Denies hematochezia, stool does not float, no mucus in stool.  Ate a [place] sandwich on Tues that made her nauseated after and since then has been nauseated with eating.  No vomiting.  Has been drinking water to stay hydrated.  Urine is light yellow.  CBG this am 124.    Social History Reviewed: never smoker. FamHx and MedHx updated.  Please see EMR. Health Maintenance: needs colonoscopy  ROS: All other systems reviewed and are negative.  Objective: Office vital signs reviewed. BP 158/69 mmHg  Pulse 56  Temp(Src) 97.9 F (36.6 C) (Oral)  Ht 5\' 2"  (1.575 m)  Wt 194 lb 11.2 oz (88.315 kg)  BMI 35.60 kg/m2  Physical Examination:  General: Awake, alert, obese female,  Appears tired, NAD HEENT: Normal    Neck: No masses palpated. No LAD    Eyes: PERRLA, EOMI    Throat: MMM, no erythema Cardio: RRR, S1S2 heard, no murmurs appreciated Pulm: CTAB, no wheezes, rhonchi or rales, normal WOB Extremities: WWP, No edema, cyanosis or clubbing; +2 pulses bilaterally MSK: Normal gait and station  Assessment/Plan: 74 y.o. female with likely viral infection.    1. Nausea, likely 2/2 viral illness - ondansetron (ZOFRAN) 4 MG tablet; Take 1 tablet (4 mg total) by mouth every 8 (eight) hours as needed for nausea or vomiting.  Dispense: 20 tablet; Refill: 0 - March 2016 EKG reviewed.  QTc  WNL. - Patient to hydrate well and get plenty of rest - return next week if no improvement. - patient to bring stool sample if continued diarrhea - return precautions reviewed  2. Cough, doubt PNA.  Likely 2/2 virus.   - Continue tessalon perles - DG Chest 2 View; Future  Janora Norlander, DO PGY-2, [place]

## 2014-09-16 ENCOUNTER — Telehealth: Payer: Self-pay | Admitting: Family Medicine

## 2014-09-16 NOTE — Telephone Encounter (Signed)
Called patient to inform them that CXR was negative.  Patient reports that she is feeling better.  Nausea and diarrhea have resolved.  Chills are also improving.  She is eating and drinking better.  No concerns at this time.   Ashly M. Lajuana Ripple, DO PGY-2, [place]

## 2014-09-20 ENCOUNTER — Telehealth: Payer: Self-pay | Admitting: Family Medicine

## 2014-09-20 ENCOUNTER — Emergency Department (HOSPITAL_COMMUNITY)
Admission: EM | Admit: 2014-09-20 | Discharge: 2014-09-20 | Disposition: A | Discharge status: Home / Self Care | Payer: Medicare Other | Attending: Emergency Medicine | Admitting: Emergency Medicine

## 2014-09-20 ENCOUNTER — Encounter (HOSPITAL_COMMUNITY): Payer: Self-pay | Admitting: *Deleted

## 2014-09-20 DIAGNOSIS — M1990 Unspecified osteoarthritis, unspecified site: Secondary | ICD-10-CM | POA: Insufficient documentation

## 2014-09-20 DIAGNOSIS — K088 Other specified disorders of teeth and supporting structures: Secondary | ICD-10-CM | POA: Diagnosis present

## 2014-09-20 DIAGNOSIS — Z79899 Other long term (current) drug therapy: Secondary | ICD-10-CM | POA: Insufficient documentation

## 2014-09-20 DIAGNOSIS — Z880 Allergy status to penicillin: Secondary | ICD-10-CM | POA: Diagnosis not present

## 2014-09-20 DIAGNOSIS — E785 Hyperlipidemia, unspecified: Secondary | ICD-10-CM | POA: Insufficient documentation

## 2014-09-20 DIAGNOSIS — I10 Essential (primary) hypertension: Secondary | ICD-10-CM | POA: Diagnosis not present

## 2014-09-20 DIAGNOSIS — Z7982 Long term (current) use of aspirin: Secondary | ICD-10-CM | POA: Insufficient documentation

## 2014-09-20 DIAGNOSIS — E119 Type 2 diabetes mellitus without complications: Secondary | ICD-10-CM | POA: Diagnosis not present

## 2014-09-20 DIAGNOSIS — K029 Dental caries, unspecified: Secondary | ICD-10-CM | POA: Diagnosis not present

## 2014-09-20 MED ORDER — CLINDAMYCIN HCL 300 MG PO CAPS
300.0000 mg | ORAL_CAPSULE | Freq: Once | ORAL | Status: AC
  Administered 2014-09-20: 300 mg via ORAL
  Filled 2014-09-20: qty 1

## 2014-09-20 MED ORDER — DEXAMETHASONE SODIUM PHOSPHATE 10 MG/ML IJ SOLN
10.0000 mg | Freq: Once | INTRAMUSCULAR | Status: AC
  Administered 2014-09-20: 10 mg via INTRAMUSCULAR
  Filled 2014-09-20: qty 1

## 2014-09-20 MED ORDER — NAPROXEN 375 MG PO TABS: 375.0000 mg | ORAL_TABLET | Freq: Two times a day (BID) | ORAL | Status: DC

## 2014-09-20 MED ORDER — CLINDAMYCIN HCL 300 MG PO CAPS: 300.0000 mg | ORAL_CAPSULE | Freq: Four times a day (QID) | ORAL | Status: DC

## 2014-09-20 NOTE — ED Notes (Signed)
Patient stated she had a tooth ache on the right side of her mouth and rinsed her mouth out iwthy peroxide.  Noticed swelling to the right cheek after using it.

## 2014-09-20 NOTE — Telephone Encounter (Signed)
Emergency Line / After Hours Call  Calling tonight since she is having tooth pain and swelling. Occurring on her right side. She put some peroxide in her mouth. She denies any fevers, nausea or vomiting. I advised that she be seen in clinic when it opens this morning but she plans on going to the ED to be seen.   Rosemarie Ax, MD PGY-3, [facility] 09/20/2014, 2:01 AM

## 2014-09-20 NOTE — Discharge Instructions (Signed)

## 2014-09-20 NOTE — ED Provider Notes (Signed)
CSN: [id number]     Arrival date & time 09/20/14  6270 History  This chart was scribed for Marco Adelson, MD by Hansel Feinstein, ED Scribe. This patient was seen in room D30C/D30C and the patient's care was started at 3:39 AM.     Chief Complaint  Patient presents with  . Dental Pain  . Oral Swelling   Patient is a 74 y.o. female presenting with tooth pain. The history is provided by the patient. No language interpreter was used.  Dental Pain Location:  Lower and upper Upper teeth location:  2/RU 2nd molar, 3/RU 1st molar, 4/RU 2nd bicuspid, 14/LU 1st molar, 13/LU 2nd bicuspid and 12/LU 1st bicuspid Lower teeth location:  18/LL 2nd molar, 30/RL 1st molar, 31/RL 2nd molar and 29/RL 2nd bicuspid Quality:  Aching Severity:  Moderate Onset quality:  Gradual Duration:  1 day Timing:  Constant Chronicity:  New Context: poor dentition   Previous work-up:  Dental exam Relieved by: peroxide rinse. Worsened by:  Nothing tried Ineffective treatments:  None tried Associated symptoms: facial pain   Associated symptoms: no drooling, no neck swelling, no oral bleeding and no trismus   Risk factors: diabetes     HPI Comments: Amber [name] is a 74 y.o. female who presents to the Emergency Department complaining of moderate left-sided dental pain onset last night. Pt states associated jaw swelling. She states she rinsed her mouth out with peroxide with mild relief. Pt is allergic to Codeine, Penicillin, Amoxicillin, sulfamethoxazole, sulfonamide derivatives. She is not followed by a dentist. She denies other symptoms.   Past Medical History  Diagnosis Date  . Diabetes mellitus   . Hypertension   . Arthritis   . Back pain   . Hyperlipidemia   . Palpitations    Past Surgical History  Procedure Laterality Date  . Colon surgery    . Abdominal hysterectomy     Family History  Problem Relation Age of Onset  . Pancreatic cancer Sister     3 sisters died of it  . Heart attack Father 5     died  . Diabetes Mother   . Cancer Mother   . Hypertension Son   . Congestive Heart Failure Son   . Hypertension Daughter    Social History  Substance Use Topics  . Smoking status: Never Smoker   . Smokeless tobacco: Former Systems developer    Quit date: 02/05/1983  . Alcohol Use: No   OB History    No data available     Review of Systems  HENT: Positive for dental problem. Negative for drooling.   All other systems reviewed and are negative.   Allergies  Amoxicillin; Codeine; Penicillins; Sulfamethoxazole; and Sulfonamide derivatives  Home Medications   Prior to Admission medications   Medication Sig Start Date End Date Taking? Authorizing Provider  aspirin EC 81 MG tablet Take 81 mg by mouth daily.   Yes Historical Provider, MD  losartan-hydrochlorothiazide (HYZAAR) 100-12.5 MG per tablet TAKE 1 TABLET BYMOUTH ONCE DAILY 07/19/14  Yes Ashly M Gottschalk, DO  metFORMIN (GLUCOPHAGE) 500 MG tablet Take 1 tablet (500 mg total) by mouth 2 (two) times daily with a meal. 08/22/14  Yes Ashly M Gottschalk, DO  metoprolol (LOPRESSOR) 50 MG tablet Take 1 tablet (50 mg total) by mouth 2 (two) times daily. 07/19/14  Yes Ashly M Gottschalk, DO  mometasone (NASONEX) 50 MCG/ACT nasal spray Place 2 sprays into the nose daily as needed. For allergy symptoms 04/04/14  Yes Ashly Windell Moulding,  DO  Multiple Vitamins-Minerals (WOMENS ONE DAILY) TABS Take 1 tablet by mouth daily.   Yes Historical Provider, MD  ondansetron (ZOFRAN) 4 MG tablet Take 1 tablet (4 mg total) by mouth every 8 (eight) hours as needed for nausea or vomiting. 09/15/14  Yes Ashly Windell Moulding, DO  Polyethyl Glycol-Propyl Glycol (SYSTANE OP) Place 2 drops into both eyes 2 (two) times daily.    Yes Historical Provider, MD  pravastatin (PRAVACHOL) 40 MG tablet Take 1 tablet (40 mg total) by mouth daily. 02/15/14  Yes Ashly M Gottschalk, DO  timolol (BETIMOL) 0.5 % ophthalmic solution Place 1 drop into the right eye 2 (two) times daily. 04/12/13  Yes  Gerda Diss, MD  benzonatate (TESSALON) 200 MG capsule Take 1 capsule (200 mg total) by mouth 2 (two) times daily as needed for cough. Patient not taking: Reported on 09/20/2014 08/25/14   Janora Norlander, DO  fexofenadine [facility] ALLERGY) 180 MG tablet Take 1 tablet (180 mg total) by mouth daily. Patient not taking: Reported on 09/20/2014 04/08/14   Liam Graham, PA-C  glucose blood (ACCU-CHEK INSTANT GLUCOSE TEST) test strip Use as instructed. Test blood sugar twice daily 12/13/10   Lyndee Hensen, MD  zoster vaccine live, PF, (ZOSTAVAX) 38250 UNT/0.65ML injection Inject 19,400 Units into the skin once. 03/23/14   Ashly M Gottschalk, DO   BP 170/73 mmHg  Pulse 66  Temp(Src) 98 F (36.7 C) (Oral)  Resp 18  Ht 5\' 1"  (1.549 m)  Wt 194 lb (87.998 kg)  BMI 36.67 kg/m2  SpO2 97% Physical Exam  Constitutional: She is oriented to person, place, and time. She appears well-developed and well-nourished.  HENT:  Head: Normocephalic and atraumatic.  Mouth/Throat: Oropharynx is clear and moist. No oropharyngeal exudate.  Necrotic right lower lateral incisor, right upper first molar, left lower first molar, left upper 2nd molar. No drainable abscess. Mild swelling of the cheek, no swelling of the neck just lateral to the commissure. No trismus.   Eyes: Conjunctivae and EOM are normal. Pupils are equal, round, and reactive to light.  Neck: Normal range of motion. Neck supple.  Cardiovascular: Normal rate, regular rhythm and normal heart sounds.   Pulmonary/Chest: Effort normal and breath sounds normal. No respiratory distress. She has no wheezes. She has no rales.  Lungs CTA  Abdominal: Soft. Bowel sounds are normal. She exhibits no distension and no mass. There is no tenderness. There is no rebound and no guarding.  Musculoskeletal: Normal range of motion.  Neurological: She is alert and oriented to person, place, and time.  Skin: Skin is warm and dry.  Psychiatric: She has a normal mood and  affect. Her behavior is normal.  Nursing note and vitals reviewed.   ED Course  Procedures (including critical care time) DIAGNOSTIC STUDIES: Oxygen Saturation is 97% on RA, normal by my interpretation.    COORDINATION OF CARE: 3:44 AM Discussed treatment plan with pt at bedside and pt agreed to plan.   Labs Review Labs Reviewed - No data to display  Imaging Review No results found. I, Amber Shuart, MD , personally reviewed and evaluated these images and lab results as part of my medical decision-making.   EKG Interpretation None      MDM   Final diagnoses:  None    Clindamycin PO QID x 7 days.  Follow up immediately for extraction return for swelling, difficulty swallowing or breathing or any concerns.    I personally performed the services described in this  documentation, which was scribed in my presence. The recorded information has been reviewed and is accurate.    Veatrice Kells, MD 09/20/14 [phone]

## 2014-09-27 DIAGNOSIS — H34811 Central retinal vein occlusion, right eye: Secondary | ICD-10-CM | POA: Diagnosis not present

## 2014-09-28 ENCOUNTER — Other Ambulatory Visit: Payer: Self-pay | Admitting: *Deleted

## 2014-09-29 MED ORDER — PRAVASTATIN SODIUM 40 MG PO TABS: 40.0000 mg | ORAL_TABLET | Freq: Every day | ORAL | Status: DC

## 2014-10-12 ENCOUNTER — Ambulatory Visit: Payer: Medicare Other | Admitting: Cardiovascular Disease

## 2014-10-18 ENCOUNTER — Encounter: Payer: Self-pay | Admitting: Family Medicine

## 2014-10-18 ENCOUNTER — Ambulatory Visit (INDEPENDENT_AMBULATORY_CARE_PROVIDER_SITE_OTHER): Payer: Medicare Other | Admitting: Family Medicine

## 2014-10-18 VITALS — BP 132/70 | HR 62 | Temp 98.0°F | Ht 62.0 in | Wt 195.2 lb

## 2014-10-18 DIAGNOSIS — R05 Cough: Secondary | ICD-10-CM | POA: Diagnosis not present

## 2014-10-18 DIAGNOSIS — R61 Generalized hyperhidrosis: Secondary | ICD-10-CM | POA: Diagnosis not present

## 2014-10-18 DIAGNOSIS — R059 Cough, unspecified: Secondary | ICD-10-CM

## 2014-10-18 LAB — POCT URINALYSIS DIPSTICK
Bilirubin, UA: NEGATIVE
Blood, UA: NEGATIVE
Glucose, UA: NEGATIVE
Ketones, UA: NEGATIVE
LEUKOCYTES UA: NEGATIVE
NITRITE UA: NEGATIVE
PH UA: 5.5
PROTEIN UA: NEGATIVE
Spec Grav, UA: 1.025
UROBILINOGEN UA: 0.2

## 2014-10-18 LAB — BASIC METABOLIC PANEL WITH GFR
BUN: 12 mg/dL (ref 7–25)
CO2: 30 mmol/L (ref 20–31)
Calcium: 9.4 mg/dL (ref 8.6–10.4)
Chloride: 103 mmol/L (ref 98–110)
Creat: 0.99 mg/dL — ABNORMAL HIGH (ref 0.60–0.93)
GFR, EST AFRICAN AMERICAN: 65 mL/min (ref >=60)
GFR, EST NON AFRICAN AMERICAN: 56 mL/min — AB (ref >=60)
Glucose, Bld: 102 mg/dL — ABNORMAL HIGH (ref 65–99)
POTASSIUM: 3.7 mmol/L (ref 3.5–5.3)
Sodium: 142 mmol/L (ref 135–146)

## 2014-10-18 NOTE — Progress Notes (Signed)
   Subjective:    Patient ID: Amber [name], female    DOB: [birth year], 74 y.o.   MRN: [id number]  HPI  Patient presents for Same Day Appointment  CC: cold sweats  # Cold sweats:  States this started about 3-4 days ago (chart review shows similar complaints over past several months)  Cold sweats during the night and day, can wake up soaked in sweat  Having a little bit of cold type symptoms, some sneezing, has had a dry cough for a few months as well  Denies any dysuria, urinary frequency/urgency/incontinence  Denies any fevers  Has had nausea but no vomiting. Prescribed zofran a month ago and has taken some of this  Social hx: never smoker  Review of Systems   See HPI for ROS. All other systems reviewed and are negative.  Past medical history, surgical, family, and social history reviewed and updated in the EMR as appropriate.  Objective:  BP 151/70 mmHg  Pulse 62  Temp(Src) 98 F (36.7 C) (Oral)  Ht 5\' 2"  (1.575 m)  Wt 195 lb 4 oz (88.565 kg)  BMI 35.70 kg/m2  SpO2 98% Vitals and nursing note reviewed  General: NAD, covering arms with coat  Eyes: PERRL, EOMI, normal conjunctiva/sclera ENTM: no rhinorrhea present, no oropharyngeal lesions Lymph node: small palpable anterior cervical node on the right, no supraclavicular, axillary, or groin lymph nodes noted. CV: RRR, nl O5D6, soft systolic murmur present, 2+ radial pulses. No LE edema. Resp: CTAB, normal effort Abdomen: obese, soft, nontender, normal bowel sounds Skin: no rashes noted Neuro: alert and oriented, no deficits noted  Assessment & Plan:  No problem-specific assessment & plan notes found for this encounter.

## 2014-10-18 NOTE — Assessment & Plan Note (Signed)
Nonspecific symptoms ongoing for past several months. Major complaint currently being persistent night and day sweats. Has had normal CBC and TSH in August as well as CXR and CMP (of note CBC was not with diff). Concerning differential would include cancer (leukemia/lymphoma) or TB (no known risk factors and normal CXR 1 month ago). Will obtain additional labs today including repeat BMP, CBC with diff and smear, ESR, UA (normal), quantiferon gold. Will f/u with results and discuss f/u with patient after these come back.

## 2014-10-19 LAB — CBC WITH DIFFERENTIAL/PLATELET
BASOS ABS: 0 10*3/uL (ref 0.0–0.1)
BASOS PCT: 0 % (ref 0–1)
EOS ABS: 0.3 10*3/uL (ref 0.0–0.7)
Eosinophils Relative: 4 % (ref 0–5)
HCT: 36.6 % (ref 36.0–46.0)
Hemoglobin: 11.4 g/dL — ABNORMAL LOW (ref 12.0–15.0)
LYMPHS ABS: 5 10*3/uL — AB (ref 0.7–4.0)
Lymphocytes Relative: 60 % — ABNORMAL HIGH (ref 12–46)
MCH: 24.5 pg — ABNORMAL LOW (ref 26.0–34.0)
MCHC: 31.1 g/dL (ref 30.0–36.0)
MCV: 78.5 fL (ref 78.0–100.0)
MPV: 10 fL (ref 8.6–12.4)
Monocytes Absolute: 0.7 10*3/uL (ref 0.1–1.0)
Monocytes Relative: 9 % (ref 3–12)
Neutro Abs: 2.2 10*3/uL (ref 1.7–7.7)
Neutrophils Relative %: 27 % — ABNORMAL LOW (ref 43–77)
PLATELETS: 358 10*3/uL (ref 150–400)
RBC: 4.66 MIL/uL (ref 3.87–5.11)
RDW: 14.4 % (ref 11.5–15.5)
WBC: 8.3 10*3/uL (ref 4.0–10.5)

## 2014-10-19 LAB — PATHOLOGIST SMEAR REVIEW

## 2014-10-20 LAB — QUANTIFERON TB GOLD ASSAY (BLOOD)
Interferon Gamma Release Assay: NEGATIVE
Mitogen value: >10 IU/mL
Quantiferon Nil Value: 0.06 IU/mL
Quantiferon Tb Ag Minus Nil Value: -0.01 IU/mL
TB Ag value: 0.05 IU/mL

## 2014-10-20 LAB — SEDIMENTATION RATE: Sed Rate: 13 mm/hr (ref 0–30)

## 2014-10-26 DIAGNOSIS — H25013 Cortical age-related cataract, bilateral: Secondary | ICD-10-CM | POA: Diagnosis not present

## 2014-10-26 DIAGNOSIS — H40053 Ocular hypertension, bilateral: Secondary | ICD-10-CM | POA: Diagnosis not present

## 2014-10-26 DIAGNOSIS — H5203 Hypermetropia, bilateral: Secondary | ICD-10-CM | POA: Diagnosis not present

## 2014-10-26 DIAGNOSIS — E119 Type 2 diabetes mellitus without complications: Secondary | ICD-10-CM | POA: Diagnosis not present

## 2014-10-26 LAB — HM DIABETES EYE EXAM

## 2014-10-31 ENCOUNTER — Telehealth: Payer: Self-pay | Admitting: Family Medicine

## 2014-10-31 NOTE — Telephone Encounter (Signed)
Called and informed of normal testing results, only somewhat abnormal was microcytic appearing RBCs possibly indicating iron deficiency. Patient says she used to take an iron supplement but ran out, I told her we could draw more blood to measure the actual iron or she could just restart her supplement. She says overall feels much better and has no further questions. -Dr. Lamar Benes

## 2014-12-01 DIAGNOSIS — H34831 Tributary (branch) retinal vein occlusion, right eye, with macular edema: Secondary | ICD-10-CM | POA: Diagnosis not present

## 2014-12-01 DIAGNOSIS — E119 Type 2 diabetes mellitus without complications: Secondary | ICD-10-CM | POA: Diagnosis not present

## 2014-12-01 LAB — HM DIABETES EYE EXAM

## 2014-12-13 ENCOUNTER — Encounter: Payer: Self-pay | Admitting: Family Medicine

## 2014-12-27 ENCOUNTER — Ambulatory Visit (INDEPENDENT_AMBULATORY_CARE_PROVIDER_SITE_OTHER): Payer: Medicare Other | Admitting: Family Medicine

## 2014-12-27 ENCOUNTER — Encounter: Payer: Self-pay | Admitting: Family Medicine

## 2014-12-27 VITALS — BP 150/63 | HR 63 | Temp 97.9°F | Ht 62.0 in | Wt 190.0 lb

## 2014-12-27 DIAGNOSIS — E119 Type 2 diabetes mellitus without complications: Secondary | ICD-10-CM | POA: Diagnosis present

## 2014-12-27 DIAGNOSIS — K0889 Other specified disorders of teeth and supporting structures: Secondary | ICD-10-CM

## 2014-12-27 DIAGNOSIS — R42 Dizziness and giddiness: Secondary | ICD-10-CM | POA: Insufficient documentation

## 2014-12-27 DIAGNOSIS — I10 Essential (primary) hypertension: Secondary | ICD-10-CM | POA: Diagnosis not present

## 2014-12-27 DIAGNOSIS — Z23 Encounter for immunization: Secondary | ICD-10-CM | POA: Diagnosis not present

## 2014-12-27 HISTORY — DX: Dizziness and giddiness: R42

## 2014-12-27 LAB — POCT GLYCOSYLATED HEMOGLOBIN (HGB A1C): HEMOGLOBIN A1C: 6.3

## 2014-12-27 NOTE — Assessment & Plan Note (Addendum)
Well controlled. A1c 6.3 today.   - Continue Metformin 500mg  TWICE A DAY - Repeat A1c in 6 months.

## 2014-12-27 NOTE — Assessment & Plan Note (Signed)
Controlled for age. - Continue current regimen - Follow up in 6 months or sooner if needed

## 2014-12-27 NOTE — Progress Notes (Signed)
Subjective: CC: dizziness, dental pain HPI: Patient is a 74 y.o. female presenting to clinic today for office visit. Concerns today include:  1. Dental pain Reports continued pain in Right top tooth that is intermittent.  Patient reports that she was seen in ED for dental pain and was given an antibiotic and pain medication in Sept of this year.  She notes that finances prevent her from getting this pulled right now.  Able to eat and drink normally.  Does note sensitivity to cold and hot.  Denies fevers, chills, or purulence from tooth.  2. Dizziness Reports that dizziness comes and goes.  Has been present for about 4 days.  Dizziness is described as lightheadedness.  No vertigo.  Not associated with positional changes.  Denies falls, vision changes, confusion, weakness.  3. Hypertension Blood pressure at home: does not monitor Blood pressure today: 150/63 Meds: Compliant with Hyzaar, Metoprolol Side effects: none ROS: Denies headache, visual changes, nausea, vomiting, chest pain, abdominal pain or shortness of breath.  4. Diabetes:  Taking medications: Metformin Side effects: none ROS: denies fever, chills, LOC, polyuria, polydipsia, numbness or tingling in extremities or chest pain.  Dizziness as above Last eye exam: 10/26/2014, no retinopathy Last foot exam: 07/01/14 Last A1c: 07/01/14, 6.4  Nephropathy screen indicated?: on ARB Last flu, zoster and/or pneumovax: flu needed  Social History Reviewed: non smoker. FamHx and MedHx updated.  Please see EMR.  ROS: Per HPI  Objective: Office vital signs reviewed. BP 150/63 mmHg  Pulse 63  Temp(Src) 97.9 F (36.6 C) (Oral)  Ht 5\' 2"  (1.575 m)  Wt 190 lb (86.183 kg)  BMI 34.74 kg/m2  Physical Examination:  General: Awake, alert, well nourished, well appearing female, No acute distress HEENT: Normal    Neck: No masses palpated. No lymphadenopathy    Eyes: PERRLA, EOMI, wears glasses    Throat: moist mucus membranes, no  erythema, Right upper molar with caries but no evidence of dental abscess/ infection, no facial tenderness to palpation, swelling or erythema. Cardio: regular rate and rhythm, S1S2 heard, no murmurs appreciated Pulm: clear to auscultation bilaterally, no wheezes, rhonchi or rales, normal work of breathing MSK: Normal gait and station Neuro: Strength and sensation grossly intact, cranial nerves 2-12 grossly in tact, no nystagmus on Dix-Hallpike  Orthostatic VS for the past 24 hrs:  BP- Lying Pulse- Lying BP- Sitting Pulse- Sitting BP- Standing at 0 minutes Pulse- Standing at 0 minutes  12/27/14 1510 142/57 mmHg 60 150/72 mmHg 58 148/74 mmHg 63  12/27/14 1509 142/57 mmHg 60 150/72 mmHg 58 148/74 mmHg 63    Assessment/ Plan: 74 y.o. female   HYPERTENSION, BENIGN SYSTEMIC Controlled for age. - Continue current regimen - Follow up in 6 months or sooner if needed  Dizziness Not vertigo.  No nystagmus on exam.  Orthostatics negative.  Dizziness is intermittent so feel it is ok to watch.  Neuro exam unremarkable - Will consider LOW dose Meclizine if worsens - Return precautions discussed  Diabetes mellitus without complication ([place]) Well controlled. A1c 6.3 today.   - Continue Metformin 500mg  TWICE A DAY - Repeat A1c in 6 months.  Pain, dental.  No evidence of infection on exam. - Will definitely need tooth removed.  Will cc chart to referral coordinator to see if there are any dental health fairs coming up for extractions.  Patient's finances are a barrier to tooth removal at this time. - Return precautions reviewed  Encounter for immunization - Flu shot administered today  Janora Norlander, DO PGY-2, [place]

## 2014-12-27 NOTE — Patient Instructions (Signed)
If you develop fevers, worsening dental pain, swelling in your face or purulence from your tooth, seek immediate medical attention.  I will send your file to [facility] to see if we can help you get that tooth pulled.

## 2014-12-27 NOTE — Assessment & Plan Note (Signed)
Not vertigo.  No nystagmus on exam.  Orthostatics negative.  Dizziness is intermittent so feel it is ok to watch.  Neuro exam unremarkable - Will consider LOW dose Meclizine if worsens - Return precautions discussed

## 2015-01-04 NOTE — Telephone Encounter (Signed)
Error

## 2015-01-25 ENCOUNTER — Other Ambulatory Visit: Payer: Self-pay | Admitting: *Deleted

## 2015-01-25 DIAGNOSIS — E119 Type 2 diabetes mellitus without complications: Secondary | ICD-10-CM

## 2015-01-25 MED ORDER — METFORMIN HCL 500 MG PO TABS: 500.0000 mg | ORAL_TABLET | Freq: Two times a day (BID) | ORAL | Status: DC

## 2015-02-07 DIAGNOSIS — H34811 Central retinal vein occlusion, right eye, with macular edema: Secondary | ICD-10-CM | POA: Diagnosis not present

## 2015-02-24 ENCOUNTER — Telehealth: Payer: Self-pay | Admitting: Family Medicine

## 2015-02-24 NOTE — Telephone Encounter (Signed)
Needs metaformin and 2 blood pressure medicines  transferred to [place] at [facility].   [facility] hasnt done it yet

## 2015-02-24 NOTE — Telephone Encounter (Signed)
Called pt and told her she would need to call [facility] again or have [facility] get intouch with [employer]. Pt understood and said she would call us back if their is a problem.  Ottis Stain, CMA

## 2015-02-27 ENCOUNTER — Other Ambulatory Visit: Payer: Self-pay | Admitting: Family Medicine

## 2015-02-27 DIAGNOSIS — I10 Essential (primary) hypertension: Secondary | ICD-10-CM

## 2015-02-27 DIAGNOSIS — E119 Type 2 diabetes mellitus without complications: Secondary | ICD-10-CM

## 2015-02-27 MED ORDER — METOPROLOL TARTRATE 50 MG PO TABS: 50.0000 mg | ORAL_TABLET | Freq: Two times a day (BID) | ORAL | Status: DC

## 2015-02-27 MED ORDER — LOSARTAN POTASSIUM-HCTZ 100-12.5 MG PO TABS: ORAL_TABLET | ORAL | Status: DC

## 2015-02-27 MED ORDER — METFORMIN HCL 500 MG PO TABS: 500.0000 mg | ORAL_TABLET | Freq: Two times a day (BID) | ORAL | Status: DC

## 2015-02-27 NOTE — Telephone Encounter (Signed)
Done.  Patient may pick these up.  Please call and let her know

## 2015-02-27 NOTE — Telephone Encounter (Signed)
Pt doesn't want to deal with [facility] or [facility]. Wants dr to write out the prescription and she come pick them up

## 2015-02-28 NOTE — Telephone Encounter (Signed)
Spoke to pt. She will come pick these up. Ottis Stain, CMA

## 2015-03-04 IMAGING — CT CT ABD-PELV W/ CM
1 of 3 series · 14 of 32 positions shown, 19 images · IV contrast (APPLIED)
Comparison: 04/05/2011.

CLINICAL DATA: Right lower quadrant pain.  Prior hysterectomy.
Diabetic hypertensive patient.

CT ABDOMEN AND PELVIS WITH CONTRAST
TECHNIQUE: Multidetector CT imaging of the abdomen and pelvis was
performed following the standard protocol during bolus
administration of intravenous contrast.
Contrast: 100mL OMNIPAQUE IOHEXOL 300 MG/ML  SOLN

[Series 2: abd/pelv with 5.0 b31f st · axial · 0.76mm/px · z∈[+726,+1130]mm · 14 of 91 slices shown, 19 images]
[im 5/91  soft-tissue]
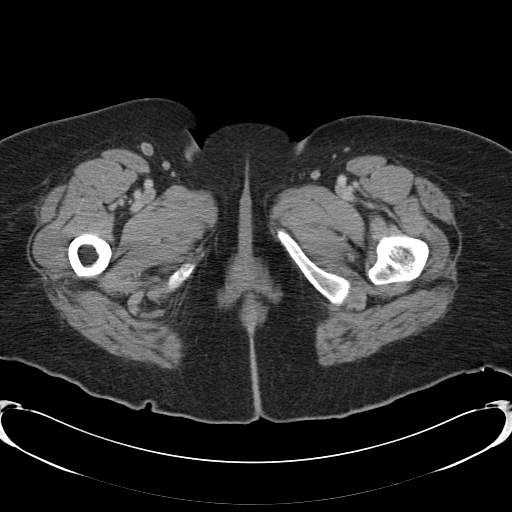
[im 5/91  bone]
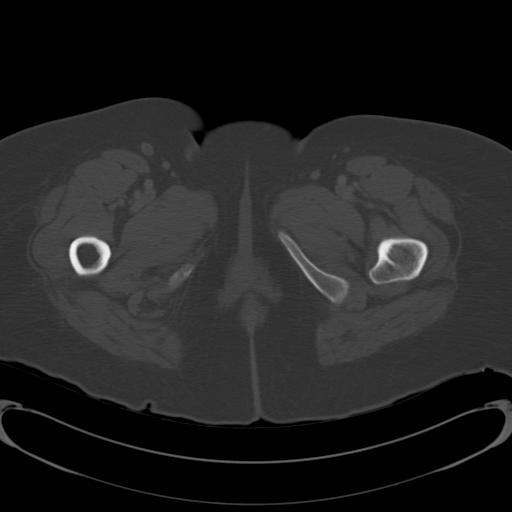
[im 15/91  soft-tissue]
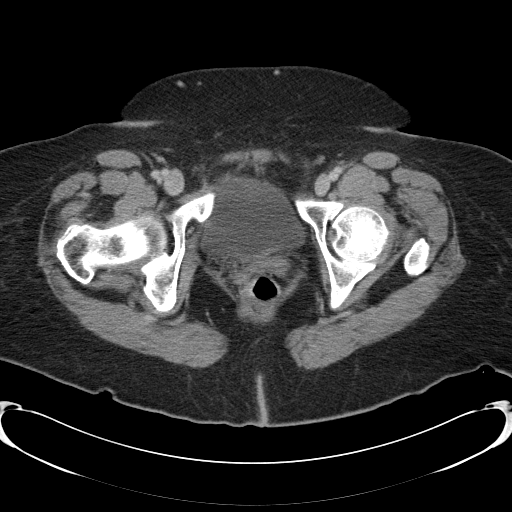
[im 19/91  soft-tissue]
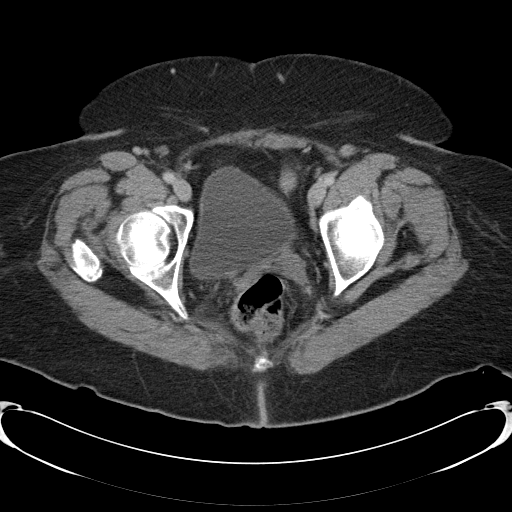
[im 24/91  soft-tissue]
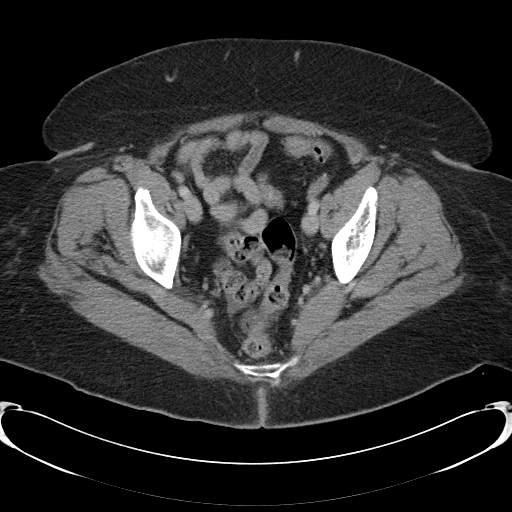
[im 34/91  soft-tissue]
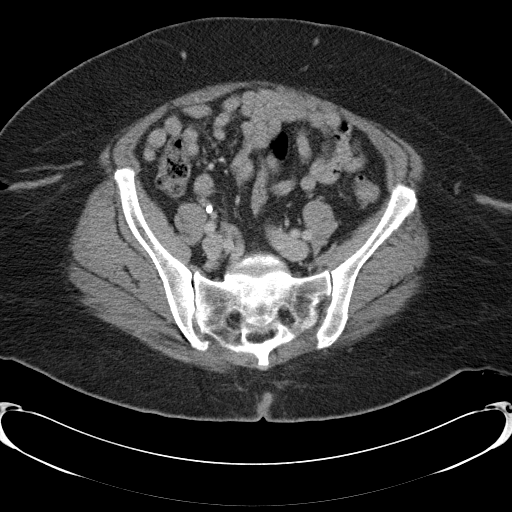
[im 38/91  soft-tissue]
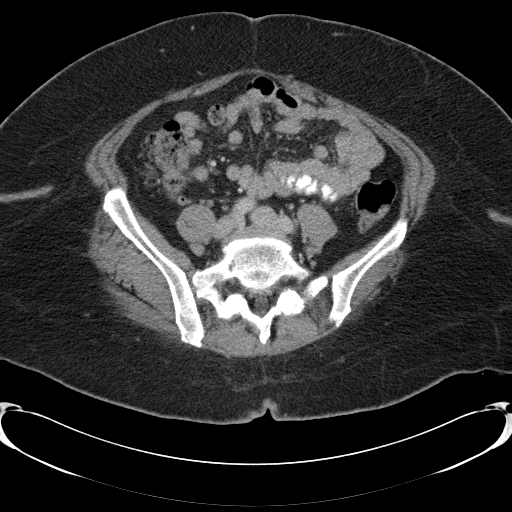
[im 48/91  soft-tissue]
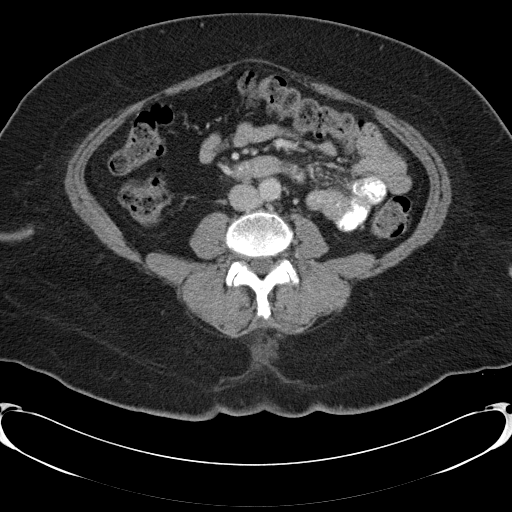
[im 53/91  soft-tissue]
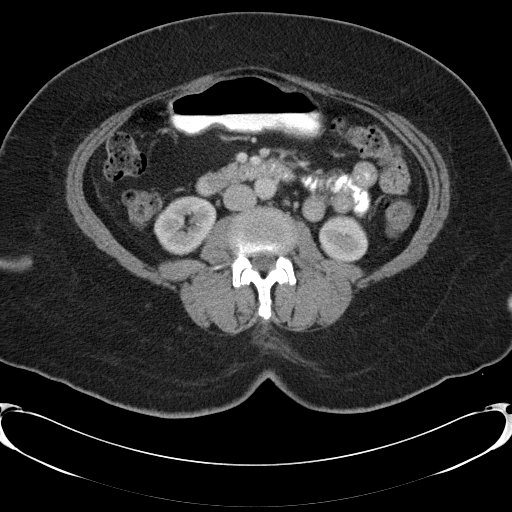
[im 57/91  soft-tissue]
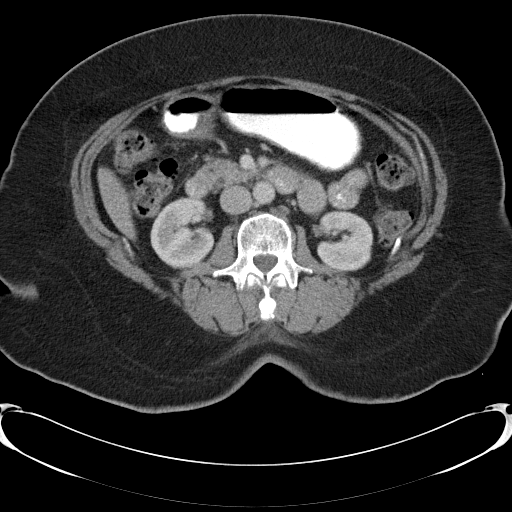
[im 57/91  bone]
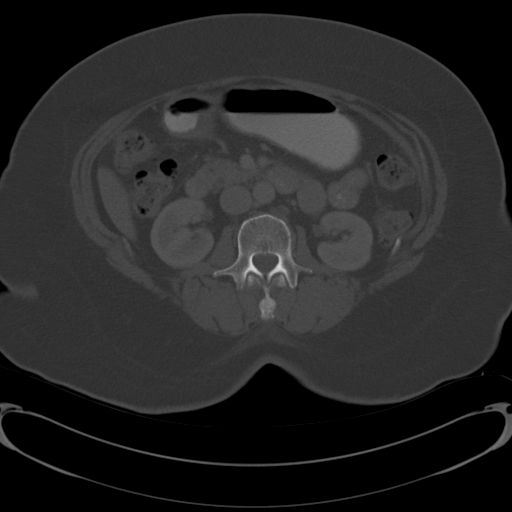
[im 67/91  soft-tissue]
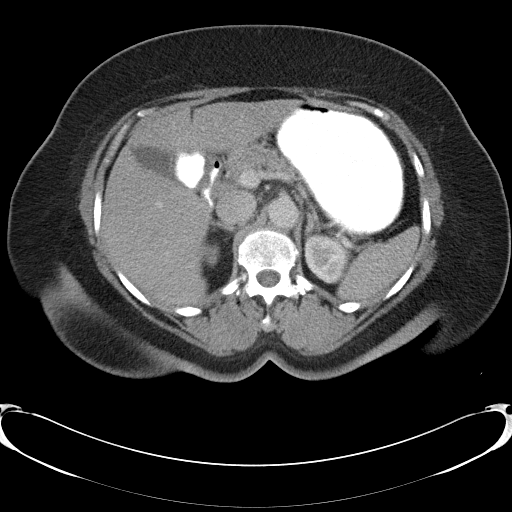
[im 72/91  soft-tissue]
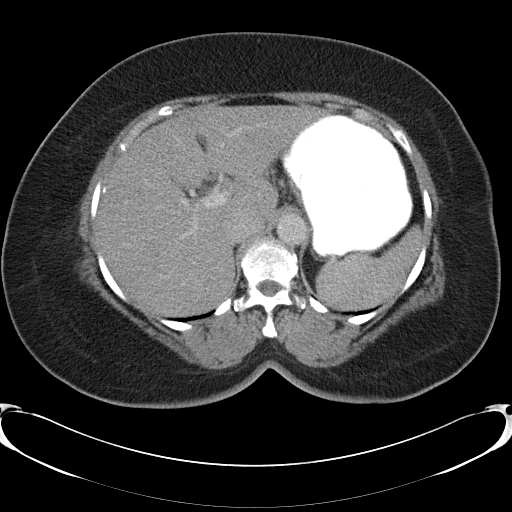
[im 72/91  lung]
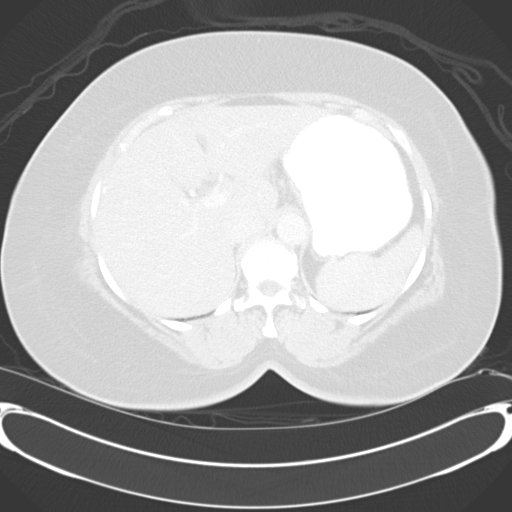
[im 76/91  soft-tissue]
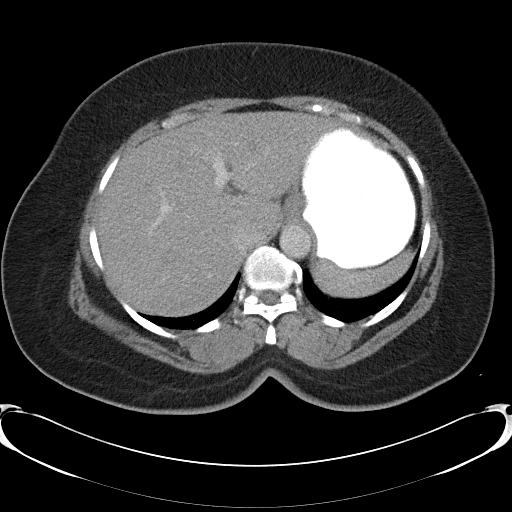
[im 76/91  lung]
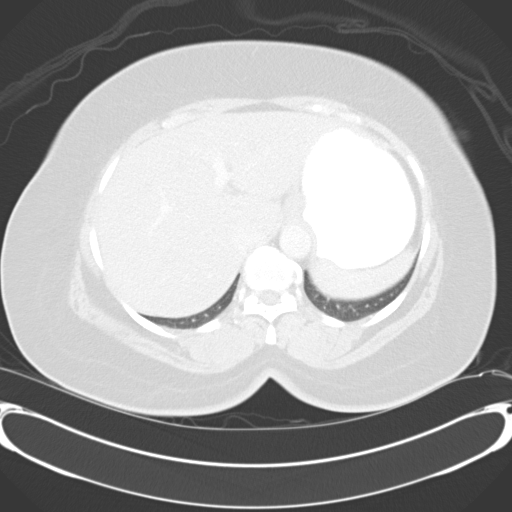
[im 81/91  lung]
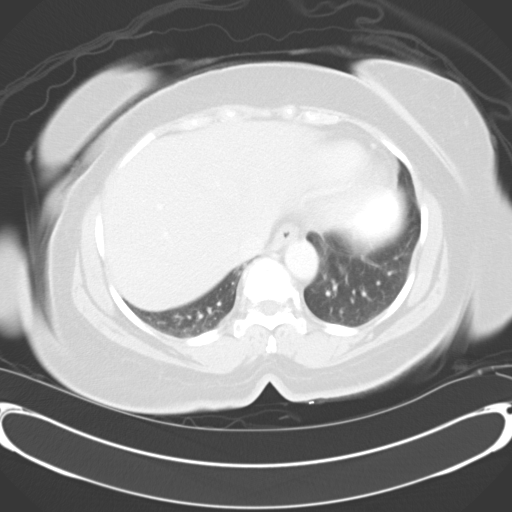
[im 86/91  soft-tissue]
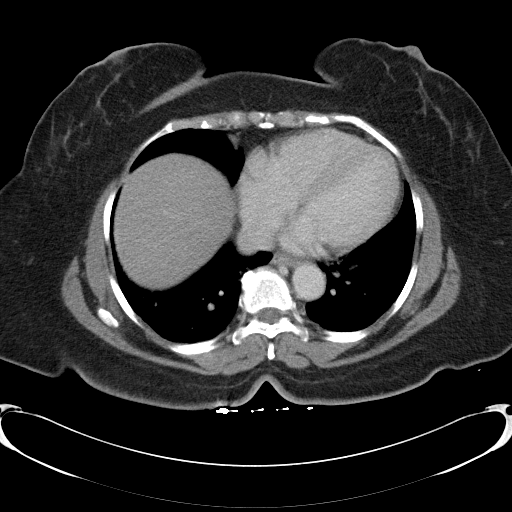
[im 86/91  lung]
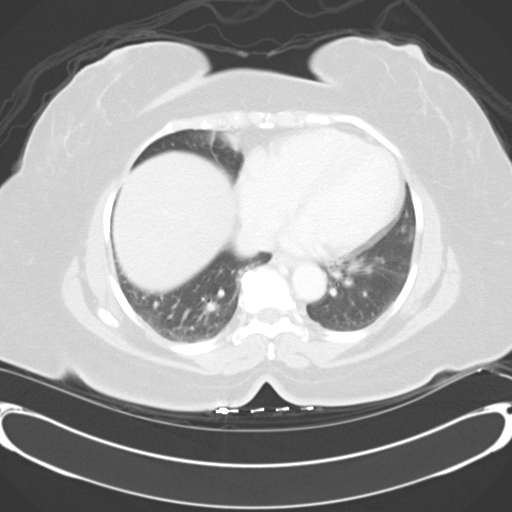

[14 of 32 positions shown; findings below may reference images not displayed]

FINDINGS: Tiny amount of free fluid in the pelvis.  Source is not
identified as no inflammation is noted surrounding portions of
bowel.  Specifically, no inflammation surrounds the appendix.

Mild fatty infiltration of the liver.  Two sub centimeter lesions
stable.  No calcified gallstone.  No focal splenic, adrenal, renal
or pancreatic lesion.

Mild atherosclerotic type changes of the aorta branch vessels
without aneurysmal dilation.

No bony destructive lesion.

Post hysterectomy.  Noncontrast filled views urinary bladder
unremarkable.  No adenopathy.

Lung bases clear.
IMPRESSION: Tiny amount of free fluid the pelvis.  Source not identified as a
discrete bowel inflammatory process is not identified.
Specifically, no inflammation surrounds the appendix.  Please see
above.

## 2015-03-06 ENCOUNTER — Ambulatory Visit (INDEPENDENT_AMBULATORY_CARE_PROVIDER_SITE_OTHER): Payer: Medicare Other | Admitting: Family Medicine

## 2015-03-06 ENCOUNTER — Ambulatory Visit: Payer: Medicare Other | Admitting: Family Medicine

## 2015-03-06 VITALS — BP 155/63 | HR 59 | Temp 98.0°F | Wt 192.7 lb

## 2015-03-06 DIAGNOSIS — R42 Dizziness and giddiness: Secondary | ICD-10-CM | POA: Diagnosis not present

## 2015-03-06 DIAGNOSIS — I10 Essential (primary) hypertension: Secondary | ICD-10-CM | POA: Diagnosis not present

## 2015-03-06 MED ORDER — METOPROLOL TARTRATE 50 MG PO TABS: 25.0000 mg | ORAL_TABLET | Freq: Two times a day (BID) | ORAL | Status: DC

## 2015-03-06 NOTE — Patient Instructions (Signed)
It was great seeing you today. Your dizziness could be related to your current cold or possible could be caused by your low heart rate.   1. Decrease your Metoprolol to 25 mg BID (1/2 pill) twice a day.    Please bring all your medications to every doctors visit  Next Appointment  Please make an appointment with Dr Lajuana Ripple in 1-2 weeks for blood pressure and dizziness   If you have any questions or concerns before then, please call the clinic at [phone].  Take Care,   Dr Phill Myron

## 2015-03-08 NOTE — Assessment & Plan Note (Signed)
Constant dizziness without vertigo or other cardiac or neurological symptoms.  Associated with URI symptoms for the past few days.  However, she has had prior similar episodes of dizziness.  Orthostatics negative, however, she is bradycardic with mild mitral valve regurg.  Dizziness, possibly related to current URI versus bradycardia.  MRI brain 03/2014 was negative for acute stroke - We will decrease metoprolol to 12.5 mg twice a day.  Likely, her blood pressure will increase and she will need additional blood pressure medication.  - Follow-up with PCP in one week for repeat blood pressure check and f/u dizziness

## 2015-03-08 NOTE — Progress Notes (Signed)
  Patient name: Amber [name] MRN [id number]  Date of birth: 03-Aug-1940  CC & HPI:  Amber [name] is a 75 y.o. female presenting today for dizziness.  She reports feeling dizzy, which she describes as lightheadedness, off balance feeling for the past 3-4 days.  She reports this feeling has been constant and did not have a sudden onset.  Dizziness is not made better or worse with any positions or positional changes.  Not associated with numbness or weakness in any extremities or vision or speech problems.  She denies any chest pain, shortness of breath, palpitations.  She denies any new medications or recent changes to her current medications.  She denies any history of heart attack or stroke.  She denies any spinning sensation.  She does endorse some nasal congestion, runny nose for the past few days.   ROS: See HPI   Medical & Surgical Hx:  Reviewed  Medications & Allergies: Reviewed  Social History: Reviewed:   Objective Findings:  Vitals: BP 155/63 mmHg  Pulse 59  Temp(Src) 98 F (36.7 C) (Oral)  Wt 192 lb 11.2 oz (87.408 kg)  Gen: NAD CV: RRR w/o m/r/g, pulses +2 b/l Resp: CTAB w/ normal respiratory effort Neuro: A&Ox4; CN 3-12 intach; Gross Sensory & Motor intact; normal cerebellar functioning in both upper and lower extremities; Normal gait  Assessment & Plan:   Dizziness Constant dizziness without vertigo or other cardiac or neurological symptoms.  Associated with URI symptoms for the past few days.  However, she has had prior similar episodes of dizziness.  Orthostatics negative, however, she is bradycardic with mild mitral valve regurg.  Dizziness, possibly related to current URI versus bradycardia.  MRI brain 03/2014 was negative for acute stroke - We will decrease metoprolol to 12.5 mg twice a day.  Likely, her blood pressure will increase and she will need additional blood pressure medication.  - Follow-up with PCP in one week for repeat blood pressure check and f/u  dizziness

## 2015-03-17 ENCOUNTER — Encounter (HOSPITAL_COMMUNITY): Payer: Self-pay | Admitting: Nurse Practitioner

## 2015-03-17 ENCOUNTER — Emergency Department (HOSPITAL_COMMUNITY)
Admission: EM | Admit: 2015-03-17 | Discharge: 2015-03-18 | Disposition: A | Discharge status: Home / Self Care | Payer: Medicare Other | Attending: Emergency Medicine | Admitting: Emergency Medicine

## 2015-03-17 DIAGNOSIS — Z880 Allergy status to penicillin: Secondary | ICD-10-CM | POA: Diagnosis not present

## 2015-03-17 DIAGNOSIS — M1990 Unspecified osteoarthritis, unspecified site: Secondary | ICD-10-CM | POA: Diagnosis not present

## 2015-03-17 DIAGNOSIS — Z7982 Long term (current) use of aspirin: Secondary | ICD-10-CM | POA: Diagnosis not present

## 2015-03-17 DIAGNOSIS — R1032 Left lower quadrant pain: Secondary | ICD-10-CM

## 2015-03-17 DIAGNOSIS — E785 Hyperlipidemia, unspecified: Secondary | ICD-10-CM | POA: Insufficient documentation

## 2015-03-17 DIAGNOSIS — Z7984 Long term (current) use of oral hypoglycemic drugs: Secondary | ICD-10-CM | POA: Diagnosis not present

## 2015-03-17 DIAGNOSIS — Z79899 Other long term (current) drug therapy: Secondary | ICD-10-CM | POA: Insufficient documentation

## 2015-03-17 DIAGNOSIS — I10 Essential (primary) hypertension: Secondary | ICD-10-CM | POA: Diagnosis not present

## 2015-03-17 DIAGNOSIS — R109 Unspecified abdominal pain: Secondary | ICD-10-CM

## 2015-03-17 DIAGNOSIS — E119 Type 2 diabetes mellitus without complications: Secondary | ICD-10-CM | POA: Insufficient documentation

## 2015-03-17 LAB — COMPREHENSIVE METABOLIC PANEL
ALBUMIN: 3.6 g/dL (ref 3.5–5.0)
ALT: 14 U/L (ref 14–54)
AST: 20 U/L (ref 15–41)
Alkaline Phosphatase: 55 U/L (ref 38–126)
Anion gap: 12 (ref 5–15)
BILIRUBIN TOTAL: 0.3 mg/dL (ref 0.3–1.2)
BUN: 12 mg/dL (ref 6–20)
CO2: 27 mmol/L (ref 22–32)
Calcium: 9.6 mg/dL (ref 8.9–10.3)
Chloride: 104 mmol/L (ref 101–111)
Creatinine, Ser: 0.91 mg/dL (ref 0.44–1.00)
GFR calc Af Amer: >60 mL/min (ref >60)
GFR calc non Af Amer: >60 mL/min (ref >60)
GLUCOSE: 113 mg/dL — AB (ref 65–99)
POTASSIUM: 4 mmol/L (ref 3.5–5.1)
Sodium: 143 mmol/L (ref 135–145)
TOTAL PROTEIN: 7.3 g/dL (ref 6.5–8.1)

## 2015-03-17 LAB — CBC
HEMATOCRIT: 35.5 % — AB (ref 36.0–46.0)
Hemoglobin: 10.8 g/dL — ABNORMAL LOW (ref 12.0–15.0)
MCH: 24.4 pg — ABNORMAL LOW (ref 26.0–34.0)
MCHC: 30.4 g/dL (ref 30.0–36.0)
MCV: 80.3 fL (ref 78.0–100.0)
Platelets: 303 10*3/uL (ref 150–400)
RBC: 4.42 MIL/uL (ref 3.87–5.11)
RDW: 14.4 % (ref 11.5–15.5)
WBC: 7.9 10*3/uL (ref 4.0–10.5)

## 2015-03-17 LAB — LIPASE, BLOOD: Lipase: 35 U/L (ref 11–51)

## 2015-03-17 NOTE — ED Notes (Addendum)
She c/o 4 hour history of pain around umbilicus radiating into her chest. She denies sob, cough, fevers, nausea, bowel/bladder changes. Describes pain as cramping. Nothing aggravates or alleviates the pain. She tried aspirin with no change in the pain. A&Ox4, resp e/u

## 2015-03-17 NOTE — ED Provider Notes (Signed)
Complains of lower abdominal pain rating to left upper quadrant onset is morning. Pain is gone at present. Nothing makes pain better or worse. Last bowel movement yesterday, normal no nausea or vomiting. No other associated symptoms. On exam alert no distress lungs clear to auscultation heart regular rate and rhythm abdomen obese, normal active bowel sounds, tender at left lower quadrant. No guarding rigidity or rebound  Amber Dakin, MD 03/17/15 [phone]

## 2015-03-17 NOTE — ED Provider Notes (Signed)
CSN: [id number]     Arrival date & time 03/17/15  63 History   First MD Initiated Contact with Patient 03/17/15 2300     Chief Complaint  Patient presents with  . Abdominal Pain     (Consider location/radiation/quality/duration/timing/severity/associated sxs/prior Treatment) HPI Comments: Patient is a 75 year old female with a history of diabetes mellitus, hypertension, arthritis, and dyslipidemia. She presents to the emergency department for evaluation of abdominal pain which began this afternoon. She states that pain was not associated with eating. She describes the pain as a cramping which originates in her left lower quadrant and radiates into her chest. Patient reports taking Tylenol for symptoms which provided no relief. She states that she had similar pain 2 years ago, "but they didn't find anything". She denies history of abdominal surgeries as well as associated fever, chills, shortness of breath, syncope, nausea, vomiting, diarrhea, melena or hematochezia, or urinary symptoms. Patient had a normal bowel movement today. She reports passing flatus throughout the day.  Patient is a 75 y.o. female presenting with abdominal pain. The history is provided by the patient. No language interpreter was used.  Abdominal Pain Associated symptoms: no diarrhea, no dysuria, no fever, no hematuria, no nausea, no shortness of breath and no vomiting     Past Medical History  Diagnosis Date  . Diabetes mellitus   . Hypertension   . Arthritis   . Back pain   . Hyperlipidemia   . Palpitations    Past Surgical History  Procedure Laterality Date  . Colon surgery    . Abdominal hysterectomy     Family History  Problem Relation Age of Onset  . Pancreatic cancer Sister     3 sisters died of it  . Heart attack Father 76    died  . Diabetes Mother   . Cancer Mother   . Hypertension Son   . Congestive Heart Failure Son   . Hypertension Daughter    Social History  Substance Use Topics  .  Smoking status: Never Smoker   . Smokeless tobacco: Former Systems developer    Quit date: 02/05/1983  . Alcohol Use: No   OB History    No data available      Review of Systems  Constitutional: Negative for fever.  Respiratory: Negative for shortness of breath.   Gastrointestinal: Positive for abdominal pain. Negative for nausea, vomiting, diarrhea and blood in stool.  Genitourinary: Negative for dysuria and hematuria.  All other systems reviewed and are negative.   Allergies  Amoxicillin; Codeine; Penicillins; Sulfamethoxazole; and Sulfonamide derivatives  Home Medications   Prior to Admission medications   Medication Sig Start Date End Date Taking? Authorizing Provider  acetaminophen (TYLENOL) 500 MG tablet Take 1 tablet (500 mg total) by mouth every 6 (six) hours as needed. 03/18/15   Antonietta Breach, PA-C  aspirin EC 81 MG tablet Take 81 mg by mouth daily.    Historical Provider, MD  glucose blood (ACCU-CHEK INSTANT GLUCOSE TEST) test strip Use as instructed. Test blood sugar twice daily 12/13/10   Lyndee Hensen, MD  losartan-hydrochlorothiazide [facility]) 100-12.5 MG tablet TAKE 1 TABLET BYMOUTH ONCE DAILY 02/27/15   Janora Norlander, DO  metFORMIN (GLUCOPHAGE) 500 MG tablet Take 1 tablet (500 mg total) by mouth 2 (two) times daily with a meal. 02/27/15   Ashly Windell Moulding, DO  metoprolol (LOPRESSOR) 50 MG tablet Take 0.5 tablets (25 mg total) by mouth 2 (two) times daily. 03/06/15   Olam Idler, MD  mometasone (NASONEX)  50 MCG/ACT nasal spray Place 2 sprays into the nose daily as needed. For allergy symptoms 04/04/14   Janora Norlander, DO  Multiple Vitamins-Minerals (WOMENS ONE DAILY) TABS Take 1 tablet by mouth daily.    Historical Provider, MD  Polyethyl Glycol-Propyl Glycol (SYSTANE OP) Place 2 drops into both eyes 2 (two) times daily.     Historical Provider, MD  pravastatin (PRAVACHOL) 40 MG tablet Take 1 tablet (40 mg total) by mouth daily. 09/29/14   Ashly Windell Moulding, DO  timolol  (BETIMOL) 0.5 % ophthalmic solution Place 1 drop into the right eye 2 (two) times daily. 04/12/13   Gerda Diss, DO  zoster vaccine live, PF, (ZOSTAVAX) 19147 UNT/0.65ML injection Inject 19,400 Units into the skin once. 03/23/14   Ashly M Gottschalk, DO   BP 127/77 mmHg  Pulse 59  Temp(Src) 97.7 F (36.5 C) (Oral)  Resp 16  SpO2 99%   Physical Exam  Constitutional: She is oriented to person, place, and time. She appears well-developed and well-nourished. No distress.  Nontoxic/nonseptic appearing  HENT:  Head: Normocephalic and atraumatic.  Eyes: Conjunctivae and EOM are normal. No scleral icterus.  Neck: Normal range of motion.  Cardiovascular: Normal rate, regular rhythm and intact distal pulses.   Pulmonary/Chest: Effort normal. No respiratory distress. She has no wheezes. She has no rales.  Respirations even and unlabored. Lungs CTAB.  Abdominal: Soft. She exhibits no distension. There is tenderness. There is no rebound.  Soft abdomen with mild TTP in the lateral LLQ, superior to the ASIS. No masses, distension, or peritoneal signs.  Musculoskeletal: Normal range of motion.  Neurological: She is alert and oriented to person, place, and time. She exhibits normal muscle tone. Coordination normal.  GCS 15. Patient moving all extremities.  Skin: Skin is warm and dry. No rash noted. She is not diaphoretic. No erythema. No pallor.  Psychiatric: She has a normal mood and affect. Her behavior is normal.  Nursing note and vitals reviewed.   ED Course  Procedures (including critical care time) Labs Review Labs Reviewed  COMPREHENSIVE METABOLIC PANEL - Abnormal; Notable for the following:    Glucose, Bld 113 (*)    All other components within normal limits  CBC - Abnormal; Notable for the following:    Hemoglobin 10.8 (*)    HCT 35.5 (*)    MCH 24.4 (*)    All other components within normal limits  URINALYSIS, ROUTINE W REFLEX MICROSCOPIC (NOT AT [facility]) - Abnormal; Notable for the  following:    Leukocytes, UA SMALL (*)    All other components within normal limits  URINE MICROSCOPIC-ADD ON - Abnormal; Notable for the following:    Squamous Epithelial / LPF 0-5 (*)    Bacteria, UA RARE (*)    All other components within normal limits  LIPASE, BLOOD    Imaging Review Ct Abdomen Pelvis W Contrast  03/18/2015  CLINICAL DATA:  Acute onset of left lower quadrant abdominal pain. Initial encounter. EXAM: CT ABDOMEN AND PELVIS WITH CONTRAST TECHNIQUE: Multidetector CT imaging of the abdomen and pelvis was performed using the standard protocol following bolus administration of intravenous contrast. CONTRAST:  14mL OMNIPAQUE IOHEXOL 300 MG/ML  SOLN COMPARISON:  CT of the abdomen and pelvis from 06/06/2012 FINDINGS: The visualized lung bases are clear. A tiny hiatal hernia is noted. Scattered coronary artery calcification is seen. A nonspecific 8 mm hypodensity is noted within the right hepatic lobe. Additional scattered smaller hypodensities are seen within the liver. The spleen  is unremarkable in appearance. The gallbladder is within normal limits. The pancreas and adrenal glands are unremarkable. The kidneys are unremarkable in appearance. There is no evidence of hydronephrosis. No renal or ureteral stones are seen. No perinephric stranding is appreciated. No free fluid is identified. The small bowel is unremarkable in appearance. The stomach is within normal limits. No acute vascular abnormalities are seen. The appendix is not well seen; there is no evidence of appendicitis. Scattered diverticulosis is noted along the sigmoid colon, without evidence of diverticulitis. The bladder is mildly distended grossly remarkable. The patient is status post hysterectomy. No suspicious adnexal masses are seen. No inguinal lymphadenopathy is seen. No acute osseous abnormalities are identified. Facet disease is noted at the lower lumbar spine. IMPRESSION: 1. No acute abnormality seen to explain the  patient's symptoms. 2. Tiny hiatal hernia noted. 3. Scattered coronary artery calcifications seen. 4. Scattered diverticulosis along the sigmoid colon, without evidence of diverticulitis. 5. Nonspecific small hypodensities within the liver, relatively stable from 2014 and likely benign. Electronically Signed   By: Garald Balding M.D.   On: 03/18/2015 00:57     I have personally reviewed and evaluated these images and lab results as part of my medical decision-making.   EKG Interpretation   Date/Time:  Friday March 17 2015 17:51:45 EST Ventricular Rate:  62 PR Interval:  174 QRS Duration: 88 QT Interval:  412 QTC Calculation: 418 R Axis:   -14 Text Interpretation:  Normal sinus rhythm Normal ECG No significant change  since last tracing Confirmed by Winfred Leeds  MD, SAM [phone]) on 03/17/2015  11:25:10 PM      MDM   Final diagnoses:  Left lower quadrant pain  Abdominal cramping    75 year old female presents to the emergency department for evaluation of abdominal pain which began yesterday. She denies any nausea, vomiting, or bowel changes. Patient is afebrile and without leukocytosis. Hemoglobin notable for anemia which is stable compared to prior workups. Remainder of laboratory evaluation is noncontributory. CT obtained to evaluate for emergent cause of symptoms. CT scan shows no acute abnormality to explain patient's symptoms.  Patient remains comfortable in the exam room bed. She is hemodynamically stable and appropriate for discharge with instruction of follow-up with her primary care doctor should her abdominal discomfort persist. Will manage residual discomfort with Tylenol. Return precautions discussed and provided. Patient discharged in good condition with no unaddressed concerns.   Filed Vitals:   03/17/15 2300 03/17/15 2315 03/17/15 2330 03/17/15 2345  BP: 145/62 149/66 162/70 127/77  Pulse: 60 59 58 59  Temp:      TempSrc:      Resp:      SpO2: 98% 99% 99% 99%      Antonietta Breach, PA-C 03/18/15 0108  Orlie Dakin, MD 03/18/15 [id number]

## 2015-03-18 ENCOUNTER — Encounter (HOSPITAL_COMMUNITY): Payer: Self-pay | Admitting: Radiology

## 2015-03-18 ENCOUNTER — Emergency Department (HOSPITAL_COMMUNITY): Payer: Medicare Other

## 2015-03-18 DIAGNOSIS — R1032 Left lower quadrant pain: Secondary | ICD-10-CM | POA: Diagnosis not present

## 2015-03-18 LAB — URINALYSIS, ROUTINE W REFLEX MICROSCOPIC
Bilirubin Urine: NEGATIVE
GLUCOSE, UA: NEGATIVE mg/dL
Hgb urine dipstick: NEGATIVE
KETONES UR: NEGATIVE mg/dL
Nitrite: NEGATIVE
PROTEIN: NEGATIVE mg/dL
Specific Gravity, Urine: 1.022 (ref 1.005–1.030)
pH: 5 (ref 5.0–8.0)

## 2015-03-18 LAB — URINE MICROSCOPIC-ADD ON

## 2015-03-18 MED ORDER — ACETAMINOPHEN 500 MG PO TABS: 500.0000 mg | ORAL_TABLET | Freq: Four times a day (QID) | ORAL | Status: DC | PRN

## 2015-03-18 MED ORDER — IOHEXOL 300 MG/ML  SOLN
100.0000 mL | Freq: Once | INTRAMUSCULAR | Status: AC | PRN
  Administered 2015-03-18: 100 mL via INTRAVENOUS

## 2015-03-18 NOTE — ED Notes (Signed)
Patient transported to CT 

## 2015-03-18 NOTE — ED Notes (Signed)
Pt departed in NAD.  

## 2015-03-18 NOTE — Discharge Instructions (Signed)

## 2015-03-21 DIAGNOSIS — H34811 Central retinal vein occlusion, right eye, with macular edema: Secondary | ICD-10-CM | POA: Diagnosis not present

## 2015-03-23 ENCOUNTER — Ambulatory Visit: Payer: Medicare Other | Admitting: Family Medicine

## 2015-04-04 ENCOUNTER — Ambulatory Visit (INDEPENDENT_AMBULATORY_CARE_PROVIDER_SITE_OTHER): Payer: Medicare Other | Admitting: Family Medicine

## 2015-04-04 ENCOUNTER — Encounter: Payer: Self-pay | Admitting: Family Medicine

## 2015-04-04 VITALS — BP 154/78 | HR 54 | Temp 98.0°F | Ht 62.0 in | Wt 194.8 lb

## 2015-04-04 DIAGNOSIS — I10 Essential (primary) hypertension: Secondary | ICD-10-CM | POA: Diagnosis not present

## 2015-04-04 DIAGNOSIS — R42 Dizziness and giddiness: Secondary | ICD-10-CM

## 2015-04-04 DIAGNOSIS — E119 Type 2 diabetes mellitus without complications: Secondary | ICD-10-CM | POA: Diagnosis not present

## 2015-04-04 LAB — POCT GLYCOSYLATED HEMOGLOBIN (HGB A1C): Hemoglobin A1C: 5.9

## 2015-04-04 NOTE — Progress Notes (Signed)
   Subjective: CC: f/u Dizziness [id number] Cathalene Sinclair is a 75 y.o. female presenting to clinic today for same day appointment. PCP: Ronnie Doss, DO Concerns today include:  1. Dizziness Patient notes that this has resolved after decreasing Metoprolol dose.  No falls.  2. DM2, controlled Compliant with meds.   No BG lows, no dizziness, No foot ulcers.  3. HTN, uncontrolled Compliant with meds.  Aware that BP is up today.  No CP, SOB, dizziness.  Social History Reviewed: non smoker. FamHx and MedHx reviewed.  Please see EMR.  ROS: Per HPI  Objective: Office vital signs reviewed. BP 154/78 mmHg  Pulse 54  Temp(Src) 98 F (36.7 C) (Oral)  Ht 5\' 2"  (1.575 m)  Wt 194 lb 12.8 oz (88.361 kg)  BMI 35.62 kg/m2  Physical Examination:  General: Awake, alert, well nourished, No acute distress HEENT: Normal, MMM Cardio: bradycardic , normal rhythm, S1S2 heard, no murmurs appreciated Pulm: clear to auscultation bilaterally, no wheezes, rhonchi or rales, normal work of breathing on room air MSK: Normal gait and station Skin: dry, intact, no rashes or lesions Neuro: Follows commands, speech normal  Results for orders placed or performed in visit on 04/04/15 (from the past 24 hour(s))  HgB A1c     Status: None   Collection Time: 04/04/15  9:36 AM  Result Value Ref Range   Hemoglobin A1C 5.9    Assessment/ Plan: 75 y.o. female   1. Diabetes mellitus without complication ([place]) - Well controlled A1c 5.9 today - Continue current regimen - HgB A1c - UTD on foot exam, eye exam.  No microalbumin needed as she is on an ACE-I  2. Dizziness - Resolved with reduction in Metoprolol  3. HYPERTENSION, BENIGN SYSTEMIC - BP elevated today.  Goal <140/90.   - Recommend coming in for recheck with RN next week - If continues to be elevated, will consider adding/ increasing medication. Though my concern is that she will start being dizzy again.  Will need to weigh risk vs benefit with  patient.   Janora Norlander, DO PGY-2, [place]

## 2015-04-04 NOTE — Patient Instructions (Addendum)
I am so glad to hear that your dizziness has gone away!  We will keep a close eye on your blood pressure now that we have decreased your Metoprolol.  See me again in 6 months or sooner if you need to.  If your belly pain returns, you start vomiting or develop fever come back and see me.  You can take Tums for heart burn.  If you have to take several daily, come back and see me.  Gastroesophageal Reflux Disease, Adult Normally, food travels down the esophagus and stays in the stomach to be digested. If a person has gastroesophageal reflux disease (GERD), food and stomach acid move back up into the esophagus. When this happens, the esophagus becomes sore and swollen (inflamed). Over time, GERD can make small holes (ulcers) in the lining of the esophagus. HOME CARE Diet  Follow a diet as told by your doctor. You may need to avoid foods and drinks such as:  Coffee and tea (with or without caffeine).  Drinks that contain alcohol.  Energy drinks and sports drinks.  Carbonated drinks or sodas.  Chocolate and cocoa.  Peppermint and mint flavorings.  Garlic and onions.  Horseradish.  Spicy and acidic foods, such as peppers, chili powder, curry powder, vinegar, hot sauces, and BBQ sauce.  Citrus fruit juices and citrus fruits, such as oranges, lemons, and limes.  Tomato-based foods, such as red sauce, chili, salsa, and pizza with red sauce.  Fried and fatty foods, such as donuts, french fries, potato chips, and high-fat dressings.  High-fat meats, such as hot dogs, rib eye steak, sausage, ham, and bacon.  High-fat dairy items, such as whole milk, butter, and cream cheese.  Eat small meals often. Avoid eating large meals.  Avoid drinking large amounts of liquid with your meals.  Avoid eating meals during the 2-3 hours before bedtime.  Avoid lying down right after you eat.  Do not exercise right after you eat. General Instructions  Pay attention to any changes in your  symptoms.  Take over-the-counter and prescription medicines only as told by your doctor. Do not take aspirin, ibuprofen, or other NSAIDs unless your doctor says it is okay.  Do not use any tobacco products, including cigarettes, chewing tobacco, and e-cigarettes. If you need help quitting, ask your doctor.  Wear loose clothes. Do not wear anything tight around your waist.  Raise (elevate) the head of your bed about 6 inches (15 cm).  Try to lower your stress. If you need help doing this, ask your doctor.  If you are overweight, lose an amount of weight that is healthy for you. Ask your doctor about a safe weight loss goal.  Keep all follow-up visits as told by your doctor. This is important. GET HELP IF:  You have new symptoms.  You lose weight and you do not know why it is happening.  You have trouble swallowing, or it hurts to swallow.  You have wheezing or a cough that keeps happening.  Your symptoms do not get better with treatment.  You have a hoarse voice. GET HELP RIGHT AWAY IF:  You have pain in your arms, neck, jaw, teeth, or back.  You feel sweaty, dizzy, or light-headed.  You have chest pain or shortness of breath.  You throw up (vomit) and your throw up looks like blood or coffee grounds.  You pass out (faint).  Your poop (stool) is bloody or black.  You cannot swallow, drink, or eat.   This information is  not intended to replace advice given to you by your health care provider. Make sure you discuss any questions you have with your health care provider.   Document Released: 07/10/2007 Document Revised: 10/12/2014 Document Reviewed: 05/18/2014 Elsevier Interactive Patient Education [employer].

## 2015-04-14 ENCOUNTER — Ambulatory Visit (INDEPENDENT_AMBULATORY_CARE_PROVIDER_SITE_OTHER): Payer: Medicare Other | Admitting: *Deleted

## 2015-04-14 VITALS — BP 142/80 | HR 62

## 2015-04-14 DIAGNOSIS — Z136 Encounter for screening for cardiovascular disorders: Secondary | ICD-10-CM

## 2015-04-14 DIAGNOSIS — Z0130 Encounter for examination of blood pressure without abnormal findings: Secondary | ICD-10-CM

## 2015-04-14 DIAGNOSIS — I10 Essential (primary) hypertension: Secondary | ICD-10-CM

## 2015-04-14 NOTE — Progress Notes (Signed)
   Patient in nurse clinic for blood pressure check.  Patient complained of dizziness when waking up this morning. While in office patient denied dizziness, chest pain, SOB, headache, n/v or numbness/tingling in extremities.  Advised patient if dizziness continues to schedule an appointment with PCP to discuss.  Patient is taking blood pressure medications as prescribed.  Will forward to PCP. Derl Barrow, RN  Today's Vitals   04/14/15 0854 04/14/15 0900  BP: 150/82 142/80  Pulse: 61 62  SpO2: 95% 96%  PainSc: 0-No pain

## 2015-04-26 DIAGNOSIS — H40032 Anatomical narrow angle, left eye: Secondary | ICD-10-CM | POA: Diagnosis not present

## 2015-04-26 DIAGNOSIS — H40031 Anatomical narrow angle, right eye: Secondary | ICD-10-CM | POA: Diagnosis not present

## 2015-05-12 ENCOUNTER — Ambulatory Visit (INDEPENDENT_AMBULATORY_CARE_PROVIDER_SITE_OTHER): Payer: Medicare Other | Admitting: Internal Medicine

## 2015-05-12 ENCOUNTER — Encounter: Payer: Self-pay | Admitting: Internal Medicine

## 2015-05-12 ENCOUNTER — Ambulatory Visit (HOSPITAL_COMMUNITY)
Admission: RE | Admit: 2015-05-12 | Discharge: 2015-05-12 | Disposition: A | Discharge status: Home / Self Care | Payer: Medicare Other | Source: Ambulatory Visit | Attending: Family Medicine | Admitting: Family Medicine

## 2015-05-12 VITALS — BP 154/63 | HR 63 | Temp 98.0°F | Wt 196.3 lb

## 2015-05-12 DIAGNOSIS — R002 Palpitations: Secondary | ICD-10-CM | POA: Insufficient documentation

## 2015-05-12 DIAGNOSIS — R001 Bradycardia, unspecified: Secondary | ICD-10-CM | POA: Insufficient documentation

## 2015-05-12 DIAGNOSIS — I10 Essential (primary) hypertension: Secondary | ICD-10-CM

## 2015-05-12 DIAGNOSIS — R9431 Abnormal electrocardiogram [ECG] [EKG]: Secondary | ICD-10-CM | POA: Diagnosis not present

## 2015-05-12 NOTE — Patient Instructions (Signed)
[name],  Thank you for coming in today. Your EKG looks normal, which is a good sign.  Please keep a journal of when you feel your chest fluttering and try avoiding caffeine for the next week as an experiment. We will set you up with another appointment with Dr. Gwenlyn Found, the cardiologist.  Please return in about 2 weeks to discuss your blood pressure.  Best, Dr. Ola Spurr  Palpitations A palpitation is the feeling that your heartbeat is irregular or is faster than normal. It may feel like your heart is fluttering or skipping a beat. Palpitations are usually not a serious problem. However, in some cases, you may need further medical evaluation. CAUSES  Palpitations can be caused by:  Smoking.  Caffeine or other stimulants, such as diet pills or energy drinks.  Alcohol.  Stress and anxiety.  Strenuous physical activity.  Fatigue.  Certain medicines.  Heart disease, especially if you have a history of irregular heart rhythms (arrhythmias), such as atrial fibrillation, atrial flutter, or supraventricular tachycardia.  An improperly working pacemaker or defibrillator. DIAGNOSIS  To find the cause of your palpitations, your health care provider will take your medical history and perform a physical exam. Your health care provider may also have you take a test called an ambulatory electrocardiogram (ECG). An ECG records your heartbeat patterns over a 24-hour period. You may also have other tests, such as:  Transthoracic echocardiogram (TTE). During echocardiography, sound waves are used to evaluate how blood flows through your heart.  Transesophageal echocardiogram (TEE).  Cardiac monitoring. This allows your health care provider to monitor your heart rate and rhythm in real time.  Holter monitor. This is a portable device that records your heartbeat and can help diagnose heart arrhythmias. It allows your health care provider to track your heart activity for several days, if  needed.  Stress tests by exercise or by giving medicine that makes the heart beat faster. TREATMENT  Treatment of palpitations depends on the cause of your symptoms and can vary greatly. Most cases of palpitations do not require any treatment other than time, relaxation, and monitoring your symptoms. Other causes, such as atrial fibrillation, atrial flutter, or supraventricular tachycardia, usually require further treatment. HOME CARE INSTRUCTIONS   Avoid:  Caffeinated coffee, tea, soft drinks, diet pills, and energy drinks.  Chocolate.  Alcohol.  Stop smoking if you smoke.  Reduce your stress and anxiety. Things that can help you relax include:  A method of controlling things in your body, such as your heartbeats, with your mind (biofeedback).  Yoga.  Meditation.  Physical activity such as swimming, jogging, or walking.  Get plenty of rest and sleep. SEEK MEDICAL CARE IF:   You continue to have a fast or irregular heartbeat beyond 24 hours.  Your palpitations occur more often. SEEK IMMEDIATE MEDICAL CARE IF:  You have chest pain or shortness of breath.  You have a severe headache.  You feel dizzy or you faint. MAKE SURE YOU:  Understand these instructions.  Will watch your condition.  Will get help right away if you are not doing well or get worse.   This information is not intended to replace advice given to you by your health care provider. Make sure you discuss any questions you have with your health care provider.   Document Released: 01/19/2000 Document Revised: 01/26/2013 Document Reviewed: 03/22/2011 Elsevier Interactive Patient Education [employer].

## 2015-05-12 NOTE — Progress Notes (Signed)
Subjective:    Patient ID: Amber [name], female    DOB: Mar 01, 1940, 75 y.o.   MRN: [id number]  HPI  Amber [name] is a 75-y/o female with history of T2DM, HTN and HLD who presents with "fluttering sensation" under her left breast.  Palpitations: - Began experiencing 4 years ago. Says it started after she took sudafed for a cold and has been coming and going since. - Occurs about 3-4 times a month but has been happening more frequently for the past week M-Th but not today. - Usually feels under her left breast towards the center of her chest. It does not radiate. She does get really hot and sweaty at times but not at the same time she feels her palpitations.  - Happens more often in the morning but can be any time. Sometimes wakes up with the sensation. - Seems to get better when she takes extra aspirin. Coughing can help resolve it as well. - Is not associated with eating. Sensation can cause her to lose her appetite.  - Drinks 1 cup of coffee daily and tea about 3 times a week. However, palpitations still occur on days she does not have her coffee.  - Only other medicine she takes that is not prescribed to her is a daily multivitamin.  - Was worked up by cardiologist Dr. Gwenlyn Found for this in 09/2013 and had Echo with grade 1 diastolic dysfunction, mild mitral valve regurgitation and mildly dilated left atrium. Holter monitor was recommended. Patient reports she wore one for 2 weeks, experienced palpitations while wearing it, but results did not show anything abnormal. However, could not find documentation to validate this. - Denies any new stressors or history of mood disorder. - Family history of maternal heart attack and father with heart disease.  HTN: - Metoprolol reduced to 12.5 mg BID due to dizziness 03/06/2015. - BPs have been elevated at subsequent follow-up and nurse visit.   Review of Systems  Respiratory: Negative for shortness of breath.   Cardiovascular: Negative  for chest pain.  Gastrointestinal: Negative for nausea, vomiting, constipation and blood in stool.  Neurological: Negative for weakness, numbness and headaches.   Social: Never smoker    Objective: Blood pressure 154/63, pulse 63, temperature 98 F (36.7 C), temperature source Oral, weight 196 lb 4.8 oz (89.041 kg), SpO2 97 %. Repeat BP 155/80.    Physical Exam  Constitutional: She is oriented to person, place, and time. She appears well-developed and well-nourished. No distress.  Cardiovascular: Normal rate and regular rhythm.  Exam reveals no gallop and no friction rub.   No murmur heard. Pulmonary/Chest: Effort normal and breath sounds normal. No respiratory distress. She exhibits no tenderness.  Abdominal: Soft. Bowel sounds are normal. She exhibits no distension. There is no tenderness. There is no rebound and no guarding.  Musculoskeletal: Normal range of motion. She exhibits no edema or tenderness.  Neurological: She is alert and oriented to person, place, and time.  Skin: Skin is warm and dry. She is not diaphoretic.   TSH was 3.095 on 09/05/14. Hgb 10.8 on 03/17/15 (BL ~ 11).      Assessment & Plan:  Amber [name] is a 75-y/o female with chronic palpitations that had increased somewhat in last week. Obtained EKG, which showed bradycardia to 60 but NSR. Recent TSH normal and no new stressors. Patient was not having palpitations at present. Suspect non-cardiac in nature but could not verify previous negative results from Holter monitoring.   Palpitations -  Asked patient to trial no caffeine for the next week. Asked her to keep a journal of her symptoms. - Ordered referral to cardiology in case of need for further assessment.  - Advised patient to go to the emergency room to be evaluated if she develops chest pain or if sensation radiates anywhere.  HYPERTENSION, BENIGN SYSTEMIC - Goal is <140/90 with history of T2DM - Likely need to adjust medications. Would not increase  metoprolol, given low heart rate.    Olene Floss, MD [facility], PGY-1

## 2015-05-12 NOTE — Assessment & Plan Note (Signed)
-   Goal is <140/90 with history of T2DM - Likely need to adjust medications. Would not increase metoprolol, given low heart rate.

## 2015-05-12 NOTE — Assessment & Plan Note (Signed)
-   Asked patient to trial no caffeine for the next week. Asked her to keep a journal of her symptoms. - Ordered referral to cardiology in case of need for further assessment.  - Advised patient to go to the emergency room to be evaluated if she develops chest pain or if sensation radiates anywhere.

## 2015-05-16 ENCOUNTER — Other Ambulatory Visit: Payer: Self-pay | Admitting: Family Medicine

## 2015-05-25 DIAGNOSIS — H40033 Anatomical narrow angle, bilateral: Secondary | ICD-10-CM | POA: Diagnosis not present

## 2015-05-25 DIAGNOSIS — H40031 Anatomical narrow angle, right eye: Secondary | ICD-10-CM | POA: Diagnosis not present

## 2015-06-02 ENCOUNTER — Encounter: Payer: Self-pay | Admitting: Family Medicine

## 2015-06-02 ENCOUNTER — Ambulatory Visit (INDEPENDENT_AMBULATORY_CARE_PROVIDER_SITE_OTHER): Payer: Medicare Other | Admitting: Family Medicine

## 2015-06-02 VITALS — BP 148/78 | Temp 98.0°F | Ht 62.0 in | Wt 192.0 lb

## 2015-06-02 DIAGNOSIS — J302 Other seasonal allergic rhinitis: Secondary | ICD-10-CM | POA: Diagnosis not present

## 2015-06-02 DIAGNOSIS — H9201 Otalgia, right ear: Secondary | ICD-10-CM | POA: Diagnosis not present

## 2015-06-02 MED ORDER — MOMETASONE FUROATE 50 MCG/ACT NA SUSP: 2.0000 | Freq: Every day | NASAL | Status: DC | PRN

## 2015-06-02 NOTE — Assessment & Plan Note (Signed)
Likely underlying contributor to ear symptoms, also with chronic allergic sinusitis. - Off nasonex x 3 months - Advised to resume, sent refill in - nasal saline PRN

## 2015-06-02 NOTE — Patient Instructions (Signed)
Thank you for coming in to clinic today.  1. Your Right ear looks healthy, ear drum is normal without sign of infection but I do see a small amount of fluid, that may be due to your sinuses and recent allergies - Refilled Nasonex nose spray, start using this again for about 1 more month to see if it provides relief - Try nasal saline for allergies as well - may take Tylenol extra strength 500mg  up to 1-2 tabs every 6-8 hours as needed (max 6 tabs in 24 hours)  Please schedule a follow-up appointment with Dr Lajuana Ripple within 3-4 weeks to follow-up R ear pain if not improved  If you have any other questions or concerns, please feel free to call the clinic to contact me. You may also schedule an earlier appointment if necessary.  However, if your symptoms get significantly worse, please go to the Emergency Department to seek immediate medical attention.  Nobie Putnam, [place]

## 2015-06-02 NOTE — Progress Notes (Signed)
   Subjective:    Patient ID: Amber [name], female    DOB: April 14, 1940, 75 y.o.   MRN: [id number]  Amber [name] is a 75 y.o. female presenting on 06/02/2015 for Ear Pain   Patient presents for a same day appointment.   HPI  RIGHT EAR PAIN, ITCHING: - Reports symptoms started about 3-4 days ago with pain on inside of Right ear also with some itching, "feels like something in there", uses mineral oil on occasion in ears, has had similar problem with left ear, never treated before. Does not use Q-tips in ears. - History environmental allergies, history of chronic sinusitis, previously using nasonex nasal spray (had just stopped within past 3 months) - Denies any hearing loss, fevers/chills, sinus pressure or headache, nasal congestion or purulence, dizziness or vertigo, ear drainage, rash   Social History  Substance Use Topics  . Smoking status: Never Smoker   . Smokeless tobacco: Former Systems developer    Quit date: 02/05/1983  . Alcohol Use: No    Review of Systems Per HPI unless specifically indicated above     Objective:    BP 148/78 mmHg  Temp(Src) 98 F (36.7 C) (Oral)  Ht 5\' 2"  (1.575 m)  Wt 192 lb (87.091 kg)  BMI 35.11 kg/m2  Wt Readings from Last 3 Encounters:  06/02/15 192 lb (87.091 kg)  05/12/15 196 lb 4.8 oz (89.041 kg)  04/04/15 194 lb 12.8 oz (88.361 kg)    Physical Exam  Constitutional: She appears well-developed and well-nourished. No distress.  Well-appearing, comfortable, cooperative  HENT:  Head: Normocephalic and atraumatic.  Right Ear: External ear normal.  Left Ear: External ear normal.  Mouth/Throat: Oropharynx is clear and moist.  Frontal and maxillary sinuses non-tender. Nares with mild bilateral turbinate edema without nasal congestion or purulence. Bilateral TM's grey without erythema, R > L mild fullness and minimal effusion, otherwise normal landmarks without bulging, canals unremarkable without abrasion or discharge, no cerumen  impaction.  Eyes: Conjunctivae are normal. Right eye exhibits no discharge. Left eye exhibits no discharge.  Neck: Normal range of motion. Neck supple.  Neurological: She is alert.  Conversational hearing is normal.  Skin: Skin is warm and dry. She is not diaphoretic.  Nursing note and vitals reviewed.      Assessment & Plan:   Problem List Items Addressed This Visit    Allergic rhinitis    Likely underlying contributor to ear symptoms, also with chronic allergic sinusitis. - Off nasonex x 3 months - Advised to resume, sent refill in - nasal saline PRN      Relevant Medications   mometasone (NASONEX) 50 MCG/ACT nasal spray    Other Visit Diagnoses    Right ear pain    -  Primary    Unclear exact dx, only minimal effusion on exam, maybe eustachian tube dysfxn w/ early allergic sinusitis (h/o chronic allergic). No evidence AOM or otitis externa, without hearing loss, exam not consistent with bacterial sinusitis. - refill Nasonex.       Meds ordered this encounter  Medications  . mometasone (NASONEX) 50 MCG/ACT nasal spray    Sig: Place 2 sprays into the nose daily as needed. For allergy symptoms    Dispense:  17 g    Refill:  2      Follow up plan: Return in about 4 weeks (around 06/30/2015), or if symptoms worsen or fail to improve, for Right ear pain.  Nobie Putnam, [place], PGY-3

## 2015-06-07 DIAGNOSIS — H34811 Central retinal vein occlusion, right eye, with macular edema: Secondary | ICD-10-CM | POA: Diagnosis not present

## 2015-06-13 DIAGNOSIS — E119 Type 2 diabetes mellitus without complications: Secondary | ICD-10-CM | POA: Diagnosis not present

## 2015-06-13 DIAGNOSIS — H34811 Central retinal vein occlusion, right eye, with macular edema: Secondary | ICD-10-CM | POA: Diagnosis not present

## 2015-06-14 ENCOUNTER — Encounter: Payer: Self-pay | Admitting: Cardiovascular Disease

## 2015-06-16 ENCOUNTER — Ambulatory Visit: Payer: Medicare Other | Admitting: Cardiovascular Disease

## 2015-07-10 ENCOUNTER — Other Ambulatory Visit: Payer: Self-pay | Admitting: Family Medicine

## 2015-07-11 ENCOUNTER — Ambulatory Visit: Payer: Medicare Other | Admitting: Family Medicine

## 2015-07-13 DIAGNOSIS — H34811 Central retinal vein occlusion, right eye, with macular edema: Secondary | ICD-10-CM | POA: Diagnosis not present

## 2015-07-24 ENCOUNTER — Other Ambulatory Visit: Payer: Self-pay | Admitting: Family Medicine

## 2015-08-18 ENCOUNTER — Encounter: Payer: Self-pay | Admitting: Student

## 2015-08-18 ENCOUNTER — Ambulatory Visit (INDEPENDENT_AMBULATORY_CARE_PROVIDER_SITE_OTHER): Payer: Medicare Other | Admitting: Student

## 2015-08-18 VITALS — BP 159/64 | HR 59 | Temp 98.4°F | Wt 194.2 lb

## 2015-08-18 DIAGNOSIS — R29898 Other symptoms and signs involving the musculoskeletal system: Secondary | ICD-10-CM

## 2015-08-18 NOTE — Assessment & Plan Note (Signed)
Resolved. This could be TIA. This happened yesterday at 9 AM and 3 PM. Each episode lasted about 5 minutes and resolved. Her neurologic exam is within normal limits. ASCVD risk score is 17.3%. She is already on pravastatin 40 mg.She has mild tenderness to palpation in the upper arm medially and laterally which makes musculoskeletal etiology possible. She denies pain at rest or with movement of the arm.    -Discussed return precautions as documented on AVS -She may benefit from high intensity statins. I will defer this to PCP

## 2015-08-18 NOTE — Progress Notes (Signed)
   Subjective:    Patient ID: Amber [name], female    DOB: Aug 31, 1940, 75 y.o.   MRN: [id number]  CC: Left arm weakness  HPI #Left arm weakness: started about 9 am yesterday. Comes and goes. This happened twice (9am and 3 pm yesterday). Never after that. Lasted about 5 minutes and resolved. Reports having similar symptom in right arm in the past that has resolved. Denies trauma, surgery or procedure in that arm. She is right handed. Denies any activity that could have contributed to that.  Denies weakness in her legs, numbness, tingling, fever, speech problem, weakness in her face. No new medication. Reports excessive sweating.   Review of Systems Objective:   Physical Exam Filed Vitals:   08/18/15 1109 08/18/15 1143  BP: 162/71 159/64  Pulse: 62 59  Temp: 98.4 F (36.9 C)   TempSrc: Oral   Weight: 194 lb 3.2 oz (88.089 kg)   SpO2: 98%     GEN: appears well, NAD CVS: RRR, normal s1 and s2, no murmurs, no edema RESP: no increased work of breathing, good air movement bilaterally, no crackles or wheeze GI: soft, non-tender,non-distended, +BS MSK: Arms look symmetric, no sign of skin lesion, swelling. Full range of motion in shoulder elbow and wrist. Tenderness to palpation over the medial lateral aspect of left upper arm. No palpable mass NEURO: A&O x3, cranial nerves II-12 intact bilaterally, motor 5 over 5 in all extremities, sensation intact in all dermatomes, biceps and patellar reflex 2+ bilaterally PSYCH: appropriate mood and affect     Assessment & Plan:  Left arm weakness Resolved. This could be TIA. This happened yesterday at 9 AM and 3 PM. Each episode lasted about 5 minutes and resolved. Her neurologic exam is within normal limits. ASCVD risk score is 17.3%. She is already on pravastatin 40 mg.She has mild tenderness to palpation in the upper arm medially and laterally which makes musculoskeletal etiology possible. She denies pain at rest or with movement of the arm.     -Discussed return precautions as documented on AVS -She may benefit from high intensity statins. I will defer this to PCP

## 2015-08-18 NOTE — Patient Instructions (Signed)
It was great seeing you today! We have addressed the following issues today  1. Left arm weakness: unclear what could be causing this. It is reassuring that your symptoms have resolved now. I have low suspicion for life-threatening or serious condition going on. Please come back and see [name] if this happens again. You can go to emergency department or call 911 if you happen to have sudden facial drooping, trouble speaking, vision change, arm or leg weakness, tingling and numbness or other symptom that is concerning. 2. Blood pressure: your blood pressure is 159/64. Your goal blood pressure is less than 150/90. Continue taking your medication.    If we did any lab work today, and the results require attention, either me or my nurse will get in touch with you. If everything is normal, you will get a letter in mail. If you don't hear from [name] in two weeks, please give [name] a call. Otherwise, I look forward to talking with you again at our next visit. If you have any questions or concerns before then, please call the clinic at [phone].  Please bring all your medications to every doctors visit   Sign up for My Chart to have easy access to your labs results, and communication with your Primary care physician.    Please check-out at the front desk before leaving the clinic.   Take Care,

## 2015-08-23 ENCOUNTER — Ambulatory Visit (INDEPENDENT_AMBULATORY_CARE_PROVIDER_SITE_OTHER): Payer: Medicare Other | Admitting: Family Medicine

## 2015-08-23 ENCOUNTER — Encounter: Payer: Self-pay | Admitting: Family Medicine

## 2015-08-23 VITALS — BP 150/60 | HR 62 | Temp 98.1°F | Ht 62.0 in | Wt 195.0 lb

## 2015-08-23 DIAGNOSIS — E119 Type 2 diabetes mellitus without complications: Secondary | ICD-10-CM

## 2015-08-23 DIAGNOSIS — M791 Myalgia, unspecified site: Secondary | ICD-10-CM

## 2015-08-23 LAB — CBC WITH DIFFERENTIAL/PLATELET
BASOS ABS: 0 {cells}/uL (ref 0–200)
Basophils Relative: 0 %
EOS ABS: 237 {cells}/uL (ref 15–500)
Eosinophils Relative: 3 %
HEMATOCRIT: 35.2 % (ref 35.0–45.0)
Hemoglobin: 11.1 g/dL — ABNORMAL LOW (ref 11.7–15.5)
LYMPHS PCT: 57 %
Lymphs Abs: 4503 cells/uL — ABNORMAL HIGH (ref 850–3900)
MCH: 24.2 pg — ABNORMAL LOW (ref 27.0–33.0)
MCHC: 31.5 g/dL — AB (ref 32.0–36.0)
MCV: 76.9 fL — AB (ref 80.0–100.0)
MONO ABS: 790 {cells}/uL (ref 200–950)
MPV: 10 fL (ref 7.5–12.5)
Monocytes Relative: 10 %
NEUTROS PCT: 30 %
Neutro Abs: 2370 cells/uL (ref 1500–7800)
Platelets: 300 10*3/uL (ref 140–400)
RBC: 4.58 MIL/uL (ref 3.80–5.10)
RDW: 15 % (ref 11.0–15.0)
WBC: 7.9 10*3/uL (ref 3.8–10.8)

## 2015-08-23 LAB — POCT GLYCOSYLATED HEMOGLOBIN (HGB A1C): Hemoglobin A1C: 6.1

## 2015-08-23 MED ORDER — NAPROXEN 500 MG PO TABS: 500.0000 mg | ORAL_TABLET | Freq: Two times a day (BID) | ORAL | Status: DC

## 2015-08-23 NOTE — Patient Instructions (Signed)
I will contact you will the results of your labs.  If anything is abnormal, I will call you.  Otherwise, expect a copy to be mailed to you.  I have prescribed an anti-inflammatory pain medication. Take this medication with food.  If you have persistent or worsening symptoms, I recommend that you come in for reevaluation like we discussed.  Drink plenty of fluids.  You can also take Tylenol in between the prescription if needed for pain/ fever.  Viral Infections A virus is a type of germ. Viruses can cause:  Minor sore throats.  Aches and pains.  Headaches.  Runny nose.  Rashes.  Watery eyes.  Tiredness.  Coughs.  Loss of appetite.  Feeling sick to your stomach (nausea).  Throwing up (vomiting).  Watery poop (diarrhea). HOME CARE   Only take medicines as told by your doctor.  Drink enough water and fluids to keep your pee (urine) clear or pale yellow. Sports drinks are a good choice.  Get plenty of rest and eat healthy. Soups and broths with crackers or rice are fine. GET HELP RIGHT AWAY IF:   You have a very bad headache.  You have shortness of breath.  You have chest pain or neck pain.  You have an unusual rash.  You cannot stop throwing up.  You have watery poop that does not stop.  You cannot keep fluids down.  You or your child has a temperature by mouth above 102 F (38.9 C), not controlled by medicine.  Your baby is older than 3 months with a rectal temperature of 102 F (38.9 C) or higher.  Your baby is 16 months old or younger with a rectal temperature of 100.4 F (38 C) or higher. MAKE SURE YOU:   Understand these instructions.  Will watch this condition.  Will get help right away if you are not doing well or get worse.   This information is not intended to replace advice given to you by your health care provider. Make sure you discuss any questions you have with your health care provider.   Document Released: 01/04/2008 Document Revised:  04/15/2011 Document Reviewed: 06/29/2014 Elsevier Interactive Patient Education [employer].

## 2015-08-23 NOTE — Progress Notes (Signed)
    Subjective: CC: generalized aches HPI: Amber [name] is a 75 y.o. female presenting to clinic today for office visit. Concerns today include:  1. Poor feeling Patient notes 1.5 week history of generally feeling bad and body aches (mostly in back).  She has been taking Tylenol every 4 hours but it does not relieve her symptoms.  She is reporting sweats for several weeks.  No weight loss, fevers, decreased appetite, sick contacts, diarrhea, cough.  Endorsing nausea x4 days.  Some head congestion.  No headache.  She has been drinking some soda to help settle her stomach.  Never a smoker.  She worked with [employer].  Upper arm numbness has resolved and has not recurred.  No dysuria, hematuria, frequency, urgency, foul odors associated with urine, rashes, hematochezia, melena.  She has had urge incontinence over the last 4 days.  Social History Reviewed: never smoker. FamHx and MedHx reviewed.  Please see EMR. Health Maintenance: colonoscopy  ROS: Per HPI  Objective: Office vital signs reviewed. BP 155/58 mmHg  Pulse 62  Temp(Src) 98.1 F (36.7 C) (Oral)  Ht 5\' 2"  (1.575 m)  Wt 195 lb (88.451 kg)  BMI 35.66 kg/m2  Physical Examination:  General: Awake, alert, well nourished, No acute distress HEENT: Normal    Neck: No masses palpated. No lymphadenopathy    Ears: Tympanic membranes intact, normal light reflex, no erythema, no bulging    Eyes: PERRLA, EOMI, sclera anicteric    Nose: nasal turbinates moist, clear rhinorrhea    Throat: moist mucus membranes, no erythema Cardio: regular rate and rhythm, S1S2 heard, no murmurs appreciated Pulm: clear to auscultation bilaterally, no wheezes, rhonchi or rales, normal WOB on room air MSK: Normal gait and station Skin: dry, intact, no rashes or lesions  Results for orders placed or performed in visit on 08/23/15 (from the past 24 hour(s))  HgB A1c     Status: None   Collection Time: 08/23/15  2:48 PM  Result Value  Ref Range   Hemoglobin A1C 6.1      Assessment/ Plan: 75 y.o. female   1. Generalized muscle ache.  I suspect secondary to viral illness.  Having chills and myalgia, with congestion.  No evidence of bacterial illness on exam.  Other than chills, no red flag symptoms.  - Supportive care; hydration - Offered Toradol injection, patient prefers oral meds. - CBC with Differential/Platelet - naproxen (NAPROSYN) 500 MG tablet; Take 1 tablet (500 mg total) by mouth 2 (two) times daily with a meal.  Dispense: 14 tablet; Refill: 0 - Discussed that if worsening symptoms, or no improvement in next week, return for reevalution, would consider more intensive workup for malignant etiology, given age.  Though my suspicion for this at this time is low, given constellation of other symptoms that suggest viral illness.  Former snuff user and obesity are risk factors.  Also has not had a colonoscopy.  2. Diabetes mellitus without complication ([place]), stable - HgB A1c - Continue current regimen   Janora Norlander, DO PGY-3, [facility]

## 2015-08-25 ENCOUNTER — Telehealth: Payer: Self-pay | Admitting: Family Medicine

## 2015-08-25 ENCOUNTER — Encounter: Payer: Self-pay | Admitting: Family Medicine

## 2015-08-25 DIAGNOSIS — D72820 Lymphocytosis (symptomatic): Secondary | ICD-10-CM

## 2015-08-25 NOTE — Telephone Encounter (Signed)
Attempted to call patient re: CBC.  Overall, stable from previous but I did note that she had an elevation in her lymphocytes.  I'd like for her to come in for a repeat CBC w/ diff and a peripheral smear to further evaluate this next week.  If it is persistently elevated or the smear is abnormal, I'd like to refer to hematology for further evaluation.  I suspect that her symptoms are secondary to a viral illness but given elevation in lymphocytes, DDx includes CLL.  Please ask patient to come in for repeat lab next week at her convenience.  Zuleika Gallus M. Lajuana Ripple, DO PGY-3, [facility]

## 2015-08-28 NOTE — Telephone Encounter (Signed)
Spoke to pt. Made appt for her to have CBC with diff done on Wed the 26th. Ottis Stain, CMA

## 2015-08-30 ENCOUNTER — Other Ambulatory Visit: Payer: Medicare Other

## 2015-08-30 DIAGNOSIS — D72820 Lymphocytosis (symptomatic): Secondary | ICD-10-CM | POA: Diagnosis not present

## 2015-08-30 LAB — CBC WITH DIFFERENTIAL/PLATELET
BASOS ABS: 0 {cells}/uL (ref 0–200)
BASOS PCT: 0 %
EOS ABS: 432 {cells}/uL (ref 15–500)
Eosinophils Relative: 6 %
HEMATOCRIT: 34.2 % — AB (ref 35.0–45.0)
Hemoglobin: 10.9 g/dL — ABNORMAL LOW (ref 11.7–15.5)
LYMPHS PCT: 56 %
Lymphs Abs: 4032 cells/uL — ABNORMAL HIGH (ref 850–3900)
MCH: 24.4 pg — AB (ref 27.0–33.0)
MCHC: 31.9 g/dL — ABNORMAL LOW (ref 32.0–36.0)
MCV: 76.7 fL — AB (ref 80.0–100.0)
MONO ABS: 504 {cells}/uL (ref 200–950)
MPV: 9.8 fL (ref 7.5–12.5)
Monocytes Relative: 7 %
NEUTROS ABS: 2232 {cells}/uL (ref 1500–7800)
Neutrophils Relative %: 31 %
Platelets: 294 10*3/uL (ref 140–400)
RBC: 4.46 MIL/uL (ref 3.80–5.10)
RDW: 14.8 % (ref 11.0–15.0)
WBC: 7.2 10*3/uL (ref 3.8–10.8)

## 2015-08-31 LAB — PATHOLOGIST SMEAR REVIEW

## 2015-09-04 ENCOUNTER — Telehealth: Payer: Self-pay | Admitting: Family Medicine

## 2015-09-04 ENCOUNTER — Other Ambulatory Visit: Payer: Self-pay | Admitting: Family Medicine

## 2015-09-04 DIAGNOSIS — D72820 Lymphocytosis (symptomatic): Secondary | ICD-10-CM

## 2015-09-04 DIAGNOSIS — H34811 Central retinal vein occlusion, right eye, with macular edema: Secondary | ICD-10-CM | POA: Diagnosis not present

## 2015-09-04 NOTE — Telephone Encounter (Signed)
Spoke to patient re: lymphocytosis.  Path smear reports reactive process.  However, patient with persistent symptoms of malaise, decreased appetite.  Will refer to hematology as her lymphocytosis is persistent.  Patient agreeable to referral.  All questions answered.  Miqueas Whilden M. Lajuana Ripple, DO PGY-3, [facility]

## 2015-09-13 ENCOUNTER — Telehealth: Payer: Self-pay | Admitting: Hematology and Oncology

## 2015-09-13 ENCOUNTER — Encounter: Payer: Self-pay | Admitting: Hematology and Oncology

## 2015-09-13 NOTE — Telephone Encounter (Signed)
Patient called to schedule an appointment. Appt made w/[facility] on 8/14 at 1130am. Patient aware to arrive 30 minutes early. Demographics verified. Letter mailed to the patient.

## 2015-09-18 ENCOUNTER — Ambulatory Visit: Payer: Medicare Other | Admitting: Hematology and Oncology

## 2015-09-19 ENCOUNTER — Emergency Department (HOSPITAL_COMMUNITY): Payer: Medicare Other

## 2015-09-19 ENCOUNTER — Emergency Department (HOSPITAL_COMMUNITY)
Admission: EM | Admit: 2015-09-19 | Discharge: 2015-09-19 | Disposition: A | Discharge status: Home / Self Care | Payer: Medicare Other | Attending: Emergency Medicine | Admitting: Emergency Medicine

## 2015-09-19 ENCOUNTER — Encounter (HOSPITAL_COMMUNITY): Payer: Self-pay | Admitting: Emergency Medicine

## 2015-09-19 DIAGNOSIS — S99911A Unspecified injury of right ankle, initial encounter: Secondary | ICD-10-CM | POA: Insufficient documentation

## 2015-09-19 DIAGNOSIS — Y929 Unspecified place or not applicable: Secondary | ICD-10-CM | POA: Insufficient documentation

## 2015-09-19 DIAGNOSIS — Z79899 Other long term (current) drug therapy: Secondary | ICD-10-CM | POA: Diagnosis not present

## 2015-09-19 DIAGNOSIS — W010XXA Fall on same level from slipping, tripping and stumbling without subsequent striking against object, initial encounter: Secondary | ICD-10-CM | POA: Insufficient documentation

## 2015-09-19 DIAGNOSIS — Z7982 Long term (current) use of aspirin: Secondary | ICD-10-CM | POA: Diagnosis not present

## 2015-09-19 DIAGNOSIS — I10 Essential (primary) hypertension: Secondary | ICD-10-CM | POA: Insufficient documentation

## 2015-09-19 DIAGNOSIS — S99921A Unspecified injury of right foot, initial encounter: Secondary | ICD-10-CM | POA: Diagnosis not present

## 2015-09-19 DIAGNOSIS — E119 Type 2 diabetes mellitus without complications: Secondary | ICD-10-CM | POA: Insufficient documentation

## 2015-09-19 DIAGNOSIS — M25571 Pain in right ankle and joints of right foot: Secondary | ICD-10-CM | POA: Diagnosis not present

## 2015-09-19 DIAGNOSIS — Y939 Activity, unspecified: Secondary | ICD-10-CM | POA: Diagnosis not present

## 2015-09-19 DIAGNOSIS — Y999 Unspecified external cause status: Secondary | ICD-10-CM | POA: Diagnosis not present

## 2015-09-19 DIAGNOSIS — M79671 Pain in right foot: Secondary | ICD-10-CM | POA: Diagnosis not present

## 2015-09-19 DIAGNOSIS — Z7984 Long term (current) use of oral hypoglycemic drugs: Secondary | ICD-10-CM | POA: Insufficient documentation

## 2015-09-19 MED ORDER — ACETAMINOPHEN 325 MG PO TABS
650.0000 mg | ORAL_TABLET | Freq: Once | ORAL | Status: AC
  Administered 2015-09-19: 650 mg via ORAL
  Filled 2015-09-19: qty 2

## 2015-09-19 NOTE — ED Notes (Signed)
Pt verbalized understanding of d/c instructions and has no further questions. Pt stable and NAD. Pt states "My ankle feels so much better with this brace"

## 2015-09-19 NOTE — Discharge Instructions (Signed)
Read the information below.   Your x-rays are negative for an obvious fracture or dislocation. I suspect he may have an ankle sprain. If treated properly, ankle sprains can be expected to recover completely; however, the length of recovery depends on the degree of injury.  A grade 1 sprain usually heals enough in 5 to 7 days to allow modified activity and requires an average of 6 weeks to heal completely.  A grade 2 sprain requires 6 to 10 weeks to heal completely.  A grade 3 sprain requires 12 to 16 weeks to heal.  A syndesmosis sprain often takes more than 3 months to heal. A brace was applied to your ankle for added stability and support. For the next 2-3 days I would like you to ice your ankle for 20 minute increments. Be sure to keep ankle elevated. You can take Tylenol 650 mg every 6 hours or Motrin 400 mg every 6 hours for pain and inflammation control.  If your symptoms persist past a week please call and schedule a follow-up with your primary care doctor for further evaluation. You may return to the Emergency Department at any time for worsening condition or any new symptoms that concern you. Return to the ED if your symptoms worsen or you develop redness/swelling/warmth to joint, inability to walk, unable to control pain at home, change in coloration of foot, or loss of sensation in foot.

## 2015-09-19 NOTE — ED Notes (Signed)
Pt transported to xray 

## 2015-09-19 NOTE — ED Provider Notes (Signed)
[facility] DEPT Provider Note   CSN: [id number] Arrival date & time: 09/19/15  1739  By signing my name below, I, Soijett Blue, attest that this documentation has been prepared under the direction and in the presence of Gay Filler, PA-C Electronically Signed: Soijett Blue, ED Scribe. 09/19/15. 8:23 PM.    History   Chief Complaint Chief Complaint  Patient presents with  . Ankle Injury    HPI Amber [name] is a 75 y.o. female with a medical hx of HTN and DM, who presents to the Emergency Department complaining of right ankle injury occurring 1 PM today. Pt notes that she was ambulating up her patio steps when she rolled her right ankle. Pt denies falling, she caught herself at the time of the incident. No head trauma. Pt notes that her right ankle pain is worsened with movement. denies alleviating factors. Pt reports that her right ankle pain is a 10/10 at this time. Pt states that she takes a 81 mg ASA daily, but denies blood thinner use. Pt is having associated symptoms of right ankle swelling and gait problem due to pain. She notes that she has tried tylenol for the relief of her symptoms. She denies color change, warmth, wound, rash, fever, numbness, tingling, and any other symptoms.    The history is provided by the patient. No language interpreter was used.    Past Medical History:  Diagnosis Date  . Arthritis   . Back pain   . Diabetes mellitus   . Hyperlipidemia   . Hypertension   . Palpitations     Patient Active Problem List   Diagnosis Date Noted  . Left arm weakness 08/18/2015  . Dizziness 12/27/2014  . Chronic night sweats 10/18/2014  . Greater trochanteric bursitis of right hip 07/06/2013  . Preventive measure 04/12/2013  . Palpitations 05/01/2012  . Chronic sinusitis 09/02/2011  . Allergic rhinitis 06/30/2007  . GERD 06/05/2006  . Diabetes mellitus without complication ([place]) [id number]  . Hyperlipidemia associated with type 2 diabetes mellitus  ([place]) 04/03/2006  . OBESITY, NOS 04/03/2006  . Iron deficiency anemia 04/03/2006  . HYPERTENSION, BENIGN SYSTEMIC 04/03/2006  . MITRAL VALVE PROLAPSE 04/03/2006  . OSTEOARTHRITIS, MULTI SITES 04/03/2006  . Proteinuria 04/03/2006    Past Surgical History:  Procedure Laterality Date  . ABDOMINAL HYSTERECTOMY    . COLON SURGERY      OB History    No data available       Home Medications    Prior to Admission medications   Medication Sig Start Date End Date Taking? Authorizing Provider  acetaminophen (TYLENOL) 500 MG tablet Take 1 tablet (500 mg total) by mouth every 6 (six) hours as needed. 03/18/15   Antonietta Breach, PA-C  aspirin EC 81 MG tablet Take 81 mg by mouth daily.    Historical Provider, MD  glucose blood (ACCU-CHEK INSTANT GLUCOSE TEST) test strip Use as instructed. Test blood sugar twice daily 12/13/10   Lyndee Hensen, MD  losartan-hydrochlorothiazide [facility]) 100-12.5 MG tablet TAKE ONE TABLET BY MOUTH ONCE DAILY 07/10/15   Hepburn N Rumley, DO  metFORMIN (GLUCOPHAGE) 500 MG tablet TAKE ONE TABLET BY MOUTH TWICE DAILY WITH MEALS 07/10/15   Dalton N Rumley, DO  metoprolol (LOPRESSOR) 50 MG tablet Take 0.5 tablets (25 mg total) by mouth 2 (two) times daily. 03/06/15   Olam Idler, MD  mometasone (NASONEX) 50 MCG/ACT nasal spray Place 2 sprays into the nose daily as needed. For allergy symptoms 06/02/15   Olin Hauser,  MD  Multiple Vitamins-Minerals (WOMENS ONE DAILY) TABS Take 1 tablet by mouth daily.    Historical Provider, MD  naproxen (NAPROSYN) 500 MG tablet Take 1 tablet (500 mg total) by mouth 2 (two) times daily with a meal. 08/23/15   Ashly Windell Moulding, DO  Polyethyl Glycol-Propyl Glycol (SYSTANE OP) Place 2 drops into both eyes 2 (two) times daily.     Historical Provider, MD  pravastatin (PRAVACHOL) 40 MG tablet TAKE ONE TABLET BY MOUTH ONCE DAILY 07/24/15   Janora Norlander, DO  timolol (BETIMOL) 0.5 % ophthalmic solution Place 1 drop into the right eye 2  (two) times daily. 04/12/13   Gerda Diss, DO  zoster vaccine live, PF, (ZOSTAVAX) 60454 UNT/0.65ML injection Inject 19,400 Units into the skin once. Patient not taking: Reported on 05/12/2015 03/23/14   Janora Norlander, DO    Family History Family History  Problem Relation Age of Onset  . Pancreatic cancer Sister     3 sisters died of it  . Heart attack Father 43    died  . Diabetes Mother   . Cancer Mother   . Hypertension Son   . Congestive Heart Failure Son   . Hypertension Daughter     Social History Social History  Substance Use Topics  . Smoking status: Never Smoker  . Smokeless tobacco: Former Systems developer    Quit date: 02/05/1983  . Alcohol use No     Allergies   Amoxicillin; Codeine; Penicillins; Sulfamethoxazole; and Sulfonamide derivatives   Review of Systems Review of Systems  Constitutional: Negative for fever.  Musculoskeletal: Positive for arthralgias (right ankle), gait problem (due to pain) and joint swelling (right ankle).  Skin: Negative for color change, rash and wound.  Neurological: Negative for numbness.       No tingling     Physical Exam Updated Vital Signs BP 129/65 (BP Location: Left Arm)   Pulse (!) 58   Temp 98.6 F (37 C) (Oral)   Resp 16   Ht 5\' 2"  (1.575 m)   Wt 88.5 kg   SpO2 100%   BMI 35.67 kg/m   Physical Exam  Constitutional: She is oriented to person, place, and time. She appears well-developed and well-nourished. No distress.  HENT:  Head: Normocephalic and atraumatic.  Eyes: EOM are normal.  Neck: Neck supple.  Cardiovascular: Normal rate.   Pulmonary/Chest: Effort normal. No respiratory distress.  Abdominal: She exhibits no distension.  Musculoskeletal: Normal range of motion.       Right ankle: She exhibits swelling. Tenderness. Lateral malleolus and medial malleolus tenderness found.       Right foot: There is bony tenderness.  Right ankle: TTP of medial and lateral malleolus. Mild swelling of ankle. TTP of  proximal 3rd and 4th metatarsal. Ankle ROM intact. Strength and sensation intact. Distal pulses intact. Cap refill less than 3 seconds.   Neurological: She is alert and oriented to person, place, and time.  Skin: Skin is warm and dry.  Psychiatric: She has a normal mood and affect. Her behavior is normal.  Nursing note and vitals reviewed.    ED Treatments / Results  DIAGNOSTIC STUDIES: Oxygen Saturation is 98% on RA, nl by my interpretation.    COORDINATION OF CARE: 8:22 PM Discussed treatment plan with pt at bedside which includes right ankle xray, right foot xray, RICE, and pt agreed to plan.   Radiology Dg Ankle Complete Right  Result Date: 09/19/2015 CLINICAL DATA:  Lateral and anterior right ankle pain  after a fall today. Previous right ankle fracture 4 years ago. EXAM: RIGHT ANKLE - COMPLETE 3+ VIEW COMPARISON:  CT of the ankle 12/08/2012 FINDINGS: There is no evidence of fracture, dislocation, or joint effusion. There is no evidence of arthropathy or other focal bone abnormality. Soft tissues are unremarkable. IMPRESSION: Negative right ankle radiograph. Electronically Signed   By: San Morelle M.D.   On: 09/19/2015 18:48   Dg Foot Complete Right  Result Date: 09/19/2015 CLINICAL DATA:  Fall today.  Pain lateral foot EXAM: RIGHT FOOT COMPLETE - 3+ VIEW COMPARISON:  None. FINDINGS: Negative for acute fracture.  No arthropathy.  Calcaneal spurring. IMPRESSION: Negative. Electronically Signed   By: Franchot Gallo M.D.   On: 09/19/2015 21:41    Procedures Procedures (including critical care time)  Medications Ordered in ED Medications  acetaminophen (TYLENOL) tablet 650 mg (650 mg Oral Given 09/19/15 2124)     Initial Impression / Assessment and Plan / ED Course  I have reviewed the triage vital signs and the nursing notes.  Pertinent imaging results that were available during my care of the patient were reviewed by me and considered in my medical decision making (see  chart for details).  Clinical Course    Patient is afebrile and nontoxic-appearing in NAD. Vital signs are stable. His exam remarkable for mild right ankle swelling, tenderness to palpation of right ankle and proximal third and fourth metatarsal. No warmth or erythema noted. Range of motion, strength, sensation intact. Distal pulses intact. Low suspicion for septic joint or gout. Patient X-Ray negative for obvious fracture or dislocation.  Pt advised to follow up with primary care provider within the next week for reevaluation. Patient given brace while in ED, conservative therapy recommended and discussed. Patient will be discharged home & is agreeable with above plan. Returns precautions discussed. Pt appears safe for discharge.   Final Clinical Impressions(s) / ED Diagnoses   Final diagnoses:  Right ankle pain    New Prescriptions New Prescriptions   No medications on file    I personally performed the services described in this documentation, which was scribed in my presence. The recorded information has been reviewed and is accurate.     Roxanna Mew, [place] 09/19/15 2208    Charlesetta Shanks, MD 10/24/15 1950

## 2015-09-19 NOTE — ED Triage Notes (Signed)
Pt st's she tripped going onto her patio earlier today.  Pt c/o pain to right ankle.  Pt ambulatory with pain.  Pt denies any other injury

## 2015-09-21 ENCOUNTER — Other Ambulatory Visit (HOSPITAL_BASED_OUTPATIENT_CLINIC_OR_DEPARTMENT_OTHER): Payer: Medicare Other

## 2015-09-21 ENCOUNTER — Encounter: Payer: Self-pay | Admitting: Hematology and Oncology

## 2015-09-21 ENCOUNTER — Ambulatory Visit (HOSPITAL_BASED_OUTPATIENT_CLINIC_OR_DEPARTMENT_OTHER): Payer: Medicare Other | Admitting: Hematology and Oncology

## 2015-09-21 ENCOUNTER — Other Ambulatory Visit (HOSPITAL_COMMUNITY)
Admission: RE | Admit: 2015-09-21 | Discharge: 2015-09-21 | Disposition: A | Discharge status: Home / Self Care | Payer: Medicare Other | Source: Ambulatory Visit | Attending: Hematology and Oncology | Admitting: Hematology and Oncology

## 2015-09-21 ENCOUNTER — Telehealth: Payer: Self-pay | Admitting: Hematology and Oncology

## 2015-09-21 VITALS — BP 163/55 | HR 71 | Temp 98.4°F | Resp 18 | Ht 62.0 in | Wt 195.3 lb

## 2015-09-21 DIAGNOSIS — D7289 Other specified disorders of white blood cells: Secondary | ICD-10-CM | POA: Diagnosis not present

## 2015-09-21 DIAGNOSIS — D72820 Lymphocytosis (symptomatic): Secondary | ICD-10-CM | POA: Insufficient documentation

## 2015-09-21 DIAGNOSIS — D509 Iron deficiency anemia, unspecified: Secondary | ICD-10-CM

## 2015-09-21 DIAGNOSIS — C9110 Chronic lymphocytic leukemia of B-cell type not having achieved remission: Secondary | ICD-10-CM

## 2015-09-21 HISTORY — DX: Chronic lymphocytic leukemia of B-cell type not having achieved remission: C91.10

## 2015-09-21 LAB — CBC & DIFF AND RETIC
BASO%: 0.1 % (ref 0.0–2.0)
BASOS ABS: 0 10*3/uL (ref 0.0–0.1)
EOS ABS: 0.3 10*3/uL (ref 0.0–0.5)
EOS%: 3.7 % (ref 0.0–7.0)
HEMATOCRIT: 36 % (ref 34.8–46.6)
HGB: 11.4 g/dL — ABNORMAL LOW (ref 11.6–15.9)
IMMATURE RETIC FRACT: 1.1 % — AB (ref 1.60–10.00)
LYMPH#: 4.3 10*3/uL — AB (ref 0.9–3.3)
LYMPH%: 60.1 % — ABNORMAL HIGH (ref 14.0–49.7)
MCH: 24.9 pg — ABNORMAL LOW (ref 25.1–34.0)
MCHC: 31.7 g/dL (ref 31.5–36.0)
MCV: 78.8 fL — ABNORMAL LOW (ref 79.5–101.0)
MONO#: 0.5 10*3/uL (ref 0.1–0.9)
MONO%: 6.6 % (ref 0.0–14.0)
NEUT#: 2.1 10*3/uL (ref 1.5–6.5)
NEUT%: 29.5 % — AB (ref 38.4–76.8)
PLATELETS: 261 10*3/uL (ref 145–400)
RBC: 4.57 10*6/uL (ref 3.70–5.45)
RDW: 15 % — AB (ref 11.2–14.5)
RETIC %: 0.67 % — AB (ref 0.70–2.10)
RETIC CT ABS: 30.62 10*3/uL — AB (ref 33.70–90.70)
WBC: 7.1 10*3/uL (ref 3.9–10.3)

## 2015-09-21 LAB — IRON AND TIBC
%SAT: 21 % (ref 21–57)
IRON: 63 ug/dL (ref 41–142)
TIBC: 293 ug/dL (ref 236–444)
UIBC: 230 ug/dL (ref 120–384)

## 2015-09-21 LAB — FERRITIN: Ferritin: 55 ng/ml (ref 9–269)

## 2015-09-21 NOTE — Assessment & Plan Note (Signed)
She has chronic lymphocytosis. I suspect she may have CLL low-grade lymphoma. I will order physician flow cytometry and FISH for evaluation. Examination is benign. I plan to see her back in 2 weeks to review test results

## 2015-09-21 NOTE — Telephone Encounter (Signed)
Gv pt appt for 10/05/15 and sent to lab today.

## 2015-09-21 NOTE — Assessment & Plan Note (Signed)
She have chronic microcytic anemia. She had epigastric discomfort and I suspect she may have microscopic GI bleed. She is taking oral iron supplement daily with food which I think could have impaired her absorption. I will order iron studies and consider giving her IV iron in the future Another possibility would be that she may have undiagnosed thalassemia However, I would prefer to order iron study first before pursuing further workup

## 2015-09-21 NOTE — Progress Notes (Signed)
[name] CONSULT NOTE  Patient Care Team: Janora Norlander, DO as PCP - General  CHIEF COMPLAINTS/PURPOSE OF CONSULTATION:  Chronic lymphocytosis  HISTORY OF PRESENTING ILLNESS:  Amber [name] 75 y.o. female is here because of lymphocytosis.  She was found to have abnormal CBC from routine blood work monitoring. I reviewed the electronic records. Her CBC from 2008 to present showed white count from 5.5 to 9.3 with absolute lymphocyte count from 1.3 to 5.5. She has chronic microcytic anemia for long time but intermittently had normal hemoglobin of 12 She denies recent infection. The last prescription antibiotics was more than 3 months ago There is not reported symptoms of sinus congestion, cough, urinary frequency/urgency or dysuria, diarrhea, joint swelling/pain or abnormal skin rash.  She had no prior history or diagnosis of cancer. Her age appropriate screening programs are up-to-date. The patient has no prior diagnosis of autoimmune disease and was not prescribed corticosteroids related products. From the anemia standpoint, she received blood transfusion remotely in 1959. She has been taking oral iron supplement for 2 years, daily with breakfast. Iron supplement causes significant constipation. She eats a variety of diet. She denies pica The patient had history of colonoscopy many years ago but no recent workup. She has occasional stomach reflux/gastritis. She used to take the naproxen intermittently for joint pain. She take aspirin chronically She denies lymphadenopathy, anorexia or abnormal weight loss  MEDICAL HISTORY:  Past Medical History:  Diagnosis Date  . Anemia   . Arthritis   . Back pain   . Blood transfusion without reported diagnosis   . CLL (chronic lymphocytic leukemia) ([place]) 09/21/2015  . Diabetes mellitus   . Hyperlipidemia   . Hypertension   . Palpitations     SURGICAL HISTORY: Past Surgical History:  Procedure Laterality Date  .  ABDOMINAL HYSTERECTOMY    . COLON SURGERY      SOCIAL HISTORY: Social History   Social History  . Marital status: Widowed    Spouse name: N/A  . Number of children: 5  . Years of education: N/A   Occupational History  . Not on file.   Social History Main Topics  . Smoking status: Never Smoker  . Smokeless tobacco: Former Systems developer    Quit date: 02/05/1983  . Alcohol use No  . Drug use: No  . Sexual activity: No   Other Topics Concern  . 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  5 children, lives alone.   Pt exercises by walking every other day.          FAMILY HISTORY: Family History  Problem Relation Age of Onset  . Pancreatic cancer Sister     3 sisters died of it  . Heart attack Father 47    died  . Diabetes Mother   . Cancer Mother   . Hypertension Son   . Congestive Heart Failure Son   . Hypertension Daughter     ALLERGIES:  is allergic to amoxicillin; codeine; penicillins; sulfamethoxazole; and sulfonamide derivatives.  MEDICATIONS:  Current Outpatient Prescriptions  Medication Sig Dispense Refill  . acetaminophen (TYLENOL) 500 MG tablet Take 1 tablet (500 mg total) by mouth every 6 (six) hours as needed. 30 tablet 0  . aspirin EC 81 MG tablet Take 81 mg by mouth daily.    Marland Kitchen glucose blood (ACCU-CHEK INSTANT GLUCOSE TEST) test strip Use  as instructed. Test blood sugar twice daily 100 each 0  . losartan-hydrochlorothiazide (HYZAAR) 100-12.5 MG tablet TAKE ONE TABLET BY MOUTH ONCE DAILY 90 tablet 0  . metFORMIN (GLUCOPHAGE) 500 MG tablet TAKE ONE TABLET BY MOUTH TWICE DAILY WITH MEALS 240 tablet 0  . metoprolol (LOPRESSOR) 50 MG tablet Take 0.5 tablets (25 mg total) by mouth 2 (two) times daily. 180 tablet 3  . mometasone (NASONEX) 50 MCG/ACT nasal spray Place 2 sprays into the nose daily as needed. For allergy symptoms 17 g 2  . Multiple Vitamins-Minerals  (WOMENS ONE DAILY) TABS Take 1 tablet by mouth daily.    . naproxen (NAPROSYN) 500 MG tablet Take 1 tablet (500 mg total) by mouth 2 (two) times daily with a meal. 14 tablet 0  . Polyethyl Glycol-Propyl Glycol (SYSTANE OP) Place 2 drops into both eyes 2 (two) times daily.     . pravastatin (PRAVACHOL) 40 MG tablet TAKE ONE TABLET BY MOUTH ONCE DAILY 60 tablet 0  . timolol (BETIMOL) 0.5 % ophthalmic solution Place 1 drop into the right eye 2 (two) times daily. 10 mL 12  . zoster vaccine live, PF, (ZOSTAVAX) 81157 UNT/0.65ML injection Inject 19,400 Units into the skin once. (Patient not taking: Reported on 05/12/2015) 1 each 0   No current facility-administered medications for this visit.     REVIEW OF SYSTEMS:   Constitutional: Denies fevers, chills or abnormal night sweats Eyes: Denies blurriness of vision, double vision or watery eyes Ears, nose, mouth, throat, and face: Denies mucositis or sore throat Respiratory: Denies cough, dyspnea or wheezes Cardiovascular: Denies palpitation, chest discomfort or lower extremity swelling Gastrointestinal:  Denies nausea, heartburn or change in bowel habits Skin: Denies abnormal skin rashes Lymphatics: Denies new lymphadenopathy or easy bruising Neurological:Denies numbness, tingling or new weaknesses Behavioral/Psych: Mood is stable, no new changes  All other systems were reviewed with the patient and are negative.  PHYSICAL EXAMINATION: ECOG PERFORMANCE STATUS: 1 - Symptomatic but completely ambulatory  Vitals:   09/21/15 1115  BP: (!) 163/55  Pulse: 71  Resp: 18  Temp: 98.4 F (36.9 C)   Filed Weights   09/21/15 1115  Weight: 195 lb 4.8 oz (88.6 kg)    GENERAL:alert, no distress and comfortable. She is morbidly obese SKIN: skin color, texture, turgor are normal, no rashes or significant lesions EYES: normal, conjunctiva are pink and non-injected, sclera clear OROPHARYNX:no exudate, no erythema and lips, buccal mucosa, and tongue  normal  NECK: supple, thyroid normal size, non-tender, without nodularity LYMPH:  no palpable lymphadenopathy in the cervical, axillary or inguinal LUNGS: clear to auscultation and percussion with normal breathing effort HEART: regular rate & rhythm and no murmurs and no lower extremity edema ABDOMEN:abdomen soft, non-tender and normal bowel sounds Musculoskeletal:no cyanosis of digits and no clubbing  PSYCH: alert & oriented x 3 with fluent speech NEURO: no focal motor/sensory deficits  LABORATORY DATA:  I have reviewed the data as listed Recent Results (from the past 2160 hour(s))  HgB A1c     Status: None   Collection Time: 08/23/15  2:48 PM  Result Value Ref Range   Hemoglobin A1C 6.1   CBC with Differential/Platelet     Status: Abnormal   Collection Time: 08/23/15  3:26 PM  Result Value Ref Range   WBC 7.9 3.8 - 10.8 K/uL   RBC 4.58 3.80 - 5.10 MIL/uL   Hemoglobin 11.1 (L) 11.7 - 15.5 g/dL   HCT 35.2 35.0 - 45.0 %   MCV 76.9 (  L) 80.0 - 100.0 fL   MCH 24.2 (L) 27.0 - 33.0 pg   MCHC 31.5 (L) 32.0 - 36.0 g/dL   RDW 15.0 11.0 - 15.0 %   Platelets 300 140 - 400 K/uL   MPV 10.0 7.5 - 12.5 fL   Neutro Abs 2,370 1,500 - 7,800 cells/uL   Lymphs Abs 4,503 (H) 850 - 3,900 cells/uL   Monocytes Absolute 790 200 - 950 cells/uL   Eosinophils Absolute 237 15 - 500 cells/uL   Basophils Absolute 0 0 - 200 cells/uL   Neutrophils Relative % 30 %   Lymphocytes Relative 57 %   Monocytes Relative 10 %   Eosinophils Relative 3 %   Basophils Relative 0 %   Smear Review Criteria for review not met     Comment: ** Please note change in unit of measure and reference range(s). **  CBC with Differential/Platelet     Status: Abnormal   Collection Time: 08/30/15  9:09 AM  Result Value Ref Range   WBC 7.2 3.8 - 10.8 K/uL   RBC 4.46 3.80 - 5.10 MIL/uL   Hemoglobin 10.9 (L) 11.7 - 15.5 g/dL   HCT 34.2 (L) 35.0 - 45.0 %   MCV 76.7 (L) 80.0 - 100.0 fL   MCH 24.4 (L) 27.0 - 33.0 pg   MCHC 31.9 (L)  32.0 - 36.0 g/dL   RDW 14.8 11.0 - 15.0 %   Platelets 294 140 - 400 K/uL   MPV 9.8 7.5 - 12.5 fL   Neutro Abs 2,232 1,500 - 7,800 cells/uL   Lymphs Abs 4,032 (H) 850 - 3,900 cells/uL   Monocytes Absolute 504 200 - 950 cells/uL   Eosinophils Absolute 432 15 - 500 cells/uL   Basophils Absolute 0 0 - 200 cells/uL   Neutrophils Relative % 31 %   Lymphocytes Relative 56 %   Monocytes Relative 7 %   Eosinophils Relative 6 %   Basophils Relative 0 %   Smear Review Criteria for review not met     Comment: ** Please note change in unit of measure and reference range(s). **  Pathologist smear review     Status: None   Collection Time: 08/30/15  9:10 AM  Result Value Ref Range   Path Review SEE NOTE     Comment: Relative lymphocytosis. Favor reactive process.  Otherwise WBCs are unremarkable. Favor reactive process.  Platelets are unremarkable. Anemia, with RBC's which appear to be microcytic and hypochromic on smear review. Suggest evaluation for iron deficiency and/or a source of blood loss, if clinically indicated.   Reviewed by Odis Hollingshead, MD, PhD, FCAP (Electronic Signature on File) 08/31/15     RADIOGRAPHIC STUDIES: I have personally reviewed the radiological images as listed and agreed with the findings in the report. Dg Ankle Complete Right  Result Date: 09/19/2015 CLINICAL DATA:  Lateral and anterior right ankle pain after a fall today. Previous right ankle fracture 4 years ago. EXAM: RIGHT ANKLE - COMPLETE 3+ VIEW COMPARISON:  CT of the ankle 12/08/2012 FINDINGS: There is no evidence of fracture, dislocation, or joint effusion. There is no evidence of arthropathy or other focal bone abnormality. Soft tissues are unremarkable. IMPRESSION: Negative right ankle radiograph. Electronically Signed   By: San Morelle M.D.   On: 09/19/2015 18:48   Dg Foot Complete Right  Result Date: 09/19/2015 CLINICAL DATA:  Fall today.  Pain lateral foot EXAM: RIGHT FOOT COMPLETE - 3+  VIEW COMPARISON:  None. FINDINGS: Negative for acute fracture.  No arthropathy.  Calcaneal spurring. IMPRESSION: Negative. Electronically Signed   By: Franchot Gallo M.D.   On: 09/19/2015 21:41    ASSESSMENT & PLAN  CLL (chronic lymphocytic leukemia) ([place]) She has chronic lymphocytosis. I suspect she may have CLL low-grade lymphoma. I will order physician flow cytometry and FISH for evaluation. Examination is benign. I plan to see her back in 2 weeks to review test results  Iron deficiency anemia She have chronic microcytic anemia. She had epigastric discomfort and I suspect she may have microscopic GI bleed. She is taking oral iron supplement daily with food which I think could have impaired her absorption. I will order iron studies and consider giving her IV iron in the future Another possibility would be that she may have undiagnosed thalassemia However, I would prefer to order iron study first before pursuing further workup   Orders Placed This Encounter  Procedures  . CBC & Diff and Retic    Standing Status:   Future    Standing Expiration Date:   10/25/2016  . Ferritin    Standing Status:   Future    Standing Expiration Date:   09/20/2016  . Flow Cytometry    CLL    Standing Status:   Future    Standing Expiration Date:   10/25/2016  . FISH, Peripheral Blood    CLL    Standing Status:   Future    Standing Expiration Date:   10/25/2016  . Iron and TIBC    Standing Status:   Future    Standing Expiration Date:   10/25/2016  . Sedimentation rate    Standing Status:   Future    Standing Expiration Date:   10/25/2016    All questions were answered. The patient knows to call the clinic with any problems, questions or concerns. I spent 30 minutes counseling the patient face to face. The total time spent in the appointment was 40 minutes and more than 50% was on counseling.     [place], [place], MD 09/21/2015 11:42 AM

## 2015-09-22 LAB — SEDIMENTATION RATE: SED RATE: 32 mm/h (ref 0–40)

## 2015-09-25 LAB — FLOW CYTOMETRY

## 2015-09-27 LAB — FISH, PERIPHERAL BLOOD

## 2015-09-28 LAB — TISSUE HYBRIDIZATION TO NCBH

## 2015-09-29 ENCOUNTER — Other Ambulatory Visit: Payer: Self-pay | Admitting: Family Medicine

## 2015-10-01 ENCOUNTER — Telehealth: Payer: Self-pay | Admitting: Student

## 2015-10-01 NOTE — Telephone Encounter (Signed)
Patient called because her right leg felt as if it were burning then she felt she had decreased feeling in her right leg. This has happened before in the past and she states she was given " a shot" in her log which resolved this sensation.  She denies weakness, chest pain, SOB. She was counseled to call the office to make a clinic appointment or if her symptoms worsen, go to the ED for evaluation  Tarica Harl A. Lincoln Brigham MD, [facility] Resident PGY-2 Pager [phone]

## 2015-10-04 ENCOUNTER — Encounter: Payer: Self-pay | Admitting: Family Medicine

## 2015-10-04 ENCOUNTER — Ambulatory Visit (INDEPENDENT_AMBULATORY_CARE_PROVIDER_SITE_OTHER): Payer: Medicare Other | Admitting: Family Medicine

## 2015-10-04 VITALS — BP 136/62 | HR 64 | Temp 98.3°F | Ht 62.0 in | Wt 194.8 lb

## 2015-10-04 DIAGNOSIS — G5711 Meralgia paresthetica, right lower limb: Secondary | ICD-10-CM

## 2015-10-04 DIAGNOSIS — G5710 Meralgia paresthetica, unspecified lower limb: Secondary | ICD-10-CM | POA: Insufficient documentation

## 2015-10-04 DIAGNOSIS — R200 Anesthesia of skin: Secondary | ICD-10-CM

## 2015-10-04 LAB — VITAMIN B12: Vitamin B-12: 340 pg/mL (ref 200–1100)

## 2015-10-04 NOTE — Patient Instructions (Signed)
Thank you so much for coming to visit today! I believe your symptoms are due to meralgia paresthetica. This is a benign condition. We will check a vitamin B12 level; if low this could also explain your numbness. Please let us know if you develop a rash as this could also be the beginning of shingles.  Dr. Gerlean Ren

## 2015-10-04 NOTE — Assessment & Plan Note (Signed)
-  Noted over right leg -Will treat conservatively for now. Recommend weight loss and rest. Also try over-the-counter therapies. -Increased risk for vitamin B12 deficiency given metformin use. Will check vitamin B12 level today. -Handout given -Return if no improvement

## 2015-10-04 NOTE — Progress Notes (Signed)
Subjective:     Patient ID: Amber [name], female   DOB: [birth year], 75 y.o.   MRN: [id number]  HPI Amber [name] is a 75yo female presenting to same day clinic for right leg numbness. -Called emergency telephone line on 10/01/2015. At that time stated that her right leg felt as if it were burning and then she felt decreased sensation in her right leg. States she has had this sensation before it resolved with the shot. -Currently prescribed metformin with increased risk of vitamin B12 deficiency -Reports Sunday she was in bed asleep when she noted the sensation like someone poured hot water over her lateral right leg. On Monday this burning sensation transitioned into numbness and tingling. -Located in her right lateral thigh from hip down to knee. -Reports aching sensation Sumners to toothache. -Reports she is still ambulating without assistance however she has been limping. -Denies headaches, slurred speech, blurred vision, weakness, chest pain.  Review of Systems Per HPI. Other systems negative.    Objective:   Physical Exam  Constitutional: She is oriented to person, place, and time. She appears well-developed and well-nourished. No distress.  Cardiovascular: Normal rate and regular rhythm.   No murmur heard. Pulmonary/Chest: No respiratory distress. She has no wheezes.  Musculoskeletal:  Muscle strength 5 out of 5 in upper and lower extremities.  Neurological: She is alert and oriented to person, place, and time. No cranial nerve deficit.  Decreased sensation over right lateral leg from hip down to right knee. The tendon reflexes normal over patella and Achilles bilaterally.  Skin: No rash noted.      Assessment and Plan:     Meralgia paraesthetica -Noted over right leg -Will treat conservatively for now. Recommend weight loss and rest. Also try over-the-counter therapies. -Increased risk for vitamin B12 deficiency given metformin use. Will check vitamin B12 level  today. -Handout given -Return if no improvement

## 2015-10-05 ENCOUNTER — Encounter: Payer: Self-pay | Admitting: Hematology and Oncology

## 2015-10-05 ENCOUNTER — Ambulatory Visit (HOSPITAL_BASED_OUTPATIENT_CLINIC_OR_DEPARTMENT_OTHER): Payer: Medicare Other | Admitting: Hematology and Oncology

## 2015-10-05 VITALS — BP 137/50 | HR 63 | Temp 97.9°F | Resp 18 | Ht 62.0 in | Wt 194.7 lb

## 2015-10-05 DIAGNOSIS — D72820 Lymphocytosis (symptomatic): Secondary | ICD-10-CM | POA: Diagnosis not present

## 2015-10-05 DIAGNOSIS — D509 Iron deficiency anemia, unspecified: Secondary | ICD-10-CM

## 2015-10-05 NOTE — Progress Notes (Signed)
[facility] OFFICE PROGRESS NOTE  Patient Care Team: Janora Norlander, DO as PCP - General  SUMMARY OF ONCOLOGIC HISTORY:  Amber [name] 75 y.o. female is here because of lymphocytosis.  She was found to have abnormal CBC from routine blood work monitoring. I reviewed the electronic records. Her CBC from 2008 to present showed white count from 5.5 to 9.3 with absolute lymphocyte count from 1.3 to 5.5. She has chronic microcytic anemia for long time but intermittently had normal hemoglobin of 12 She denies recent infection. The last prescription antibiotics was more than 3 months ago There is not reported symptoms of sinus congestion, cough, urinary frequency/urgency or dysuria, diarrhea, joint swelling/pain or abnormal skin rash.  She had no prior history or diagnosis of cancer. Her age appropriate screening programs are up-to-date. The patient has no prior diagnosis of autoimmune disease and was not prescribed corticosteroids related products. From the anemia standpoint, she received blood transfusion remotely in 1959. She has been taking oral iron supplement for 2 years, daily with breakfast. Iron supplement causes significant constipation. She eats a variety of diet. She denies pica The patient had history of colonoscopy many years ago but no recent workup. She has occasional stomach reflux/gastritis. She used to take the naproxen intermittently for joint pain. She take aspirin chronically She denies lymphadenopathy, anorexia or abnormal weight loss Flow cytometry is inconclusive for CLL and FISH analysis was negative  INTERVAL HISTORY: Please see below for problem oriented charting. She returns to review test results. She feels well. Denies recent infection  REVIEW OF SYSTEMS:   Constitutional: Denies fevers, chills or abnormal weight loss Eyes: Denies blurriness of vision Ears, nose, mouth, throat, and face: Denies mucositis or sore throat Respiratory:  Denies cough, dyspnea or wheezes Cardiovascular: Denies palpitation, chest discomfort or lower extremity swelling Gastrointestinal:  Denies nausea, heartburn or change in bowel habits Skin: Denies abnormal skin rashes Lymphatics: Denies new lymphadenopathy or easy bruising Neurological:Denies numbness, tingling or new weaknesses Behavioral/Psych: Mood is stable, no new changes  All other systems were reviewed with the patient and are negative.  I have reviewed the past medical history, past surgical history, social history and family history with the patient and they are unchanged from previous note.  ALLERGIES:  is allergic to amoxicillin; codeine; penicillins; sulfamethoxazole; and sulfonamide derivatives.  MEDICATIONS:  Current Outpatient Prescriptions  Medication Sig Dispense Refill  . acetaminophen (TYLENOL) 500 MG tablet Take 1 tablet (500 mg total) by mouth every 6 (six) hours as needed. 30 tablet 0  . aspirin EC 81 MG tablet Take 81 mg by mouth daily.    Marland Kitchen glucose blood (ACCU-CHEK INSTANT GLUCOSE TEST) test strip Use as instructed. Test blood sugar twice daily 100 each 0  . losartan-hydrochlorothiazide (HYZAAR) 100-12.5 MG tablet TAKE ONE TABLET BY MOUTH ONCE DAILY 90 tablet 0  . metFORMIN (GLUCOPHAGE) 500 MG tablet TAKE ONE TABLET BY MOUTH TWICE DAILY WITH MEALS 240 tablet 0  . metoprolol (LOPRESSOR) 50 MG tablet Take 0.5 tablets (25 mg total) by mouth 2 (two) times daily. 180 tablet 3  . mometasone (NASONEX) 50 MCG/ACT nasal spray Place 2 sprays into the nose daily as needed. For allergy symptoms 17 g 2  . Multiple Vitamins-Minerals (WOMENS ONE DAILY) TABS Take 1 tablet by mouth daily.    . naproxen (NAPROSYN) 500 MG tablet Take 1 tablet (500 mg total) by mouth 2 (two) times daily with a meal. 14 tablet 0  . Polyethyl Glycol-Propyl Glycol (SYSTANE OP)  Place 2 drops into both eyes 2 (two) times daily.     . pravastatin (PRAVACHOL) 40 MG tablet TAKE ONE TABLET BY MOUTH ONCE DAILY 90  tablet 3  . timolol (BETIMOL) 0.5 % ophthalmic solution Place 1 drop into the right eye 2 (two) times daily. 10 mL 12  . zoster vaccine live, PF, (ZOSTAVAX) 16109 UNT/0.65ML injection Inject 19,400 Units into the skin once. 1 each 0   No current facility-administered medications for this visit.     PHYSICAL EXAMINATION: ECOG PERFORMANCE STATUS: 0 - Asymptomatic  Vitals:   10/05/15 1119  BP: (!) 137/50  Pulse: 63  Resp: 18  Temp: 97.9 F (36.6 C)   Filed Weights   10/05/15 1119  Weight: 194 lb 11.2 oz (88.3 kg)    GENERAL:alert, no distress and comfortable SKIN: skin color, texture, turgor are normal, no rashes or significant lesions EYES: normal, Conjunctiva are pink and non-injected, sclera clear Musculoskeletal:no cyanosis of digits and no clubbing  NEURO: alert & oriented x 3 with fluent speech, no focal motor/sensory deficits  LABORATORY DATA:  I have reviewed the data as listed    Component Value Date/Time   NA 143 03/17/2015 1757   K 4.0 03/17/2015 1757   CL 104 03/17/2015 1757   CO2 27 03/17/2015 1757   GLUCOSE 113 (H) 03/17/2015 1757   BUN 12 03/17/2015 1757   CREATININE 0.91 03/17/2015 1757   CREATININE 0.99 (H) 10/18/2014 1600   CALCIUM 9.6 03/17/2015 1757   PROT 7.3 03/17/2015 1757   ALBUMIN 3.6 03/17/2015 1757   AST 20 03/17/2015 1757   ALT 14 03/17/2015 1757   ALKPHOS 55 03/17/2015 1757   BILITOT 0.3 03/17/2015 1757   GFRNONAA >60 03/17/2015 1757   GFRNONAA 56 (L) 10/18/2014 1600   GFRAA >60 03/17/2015 1757   GFRAA 65 10/18/2014 1600    No results found for: SPEP, UPEP  Lab Results  Component Value Date   WBC 7.1 09/21/2015   NEUTROABS 2.1 09/21/2015   HGB 11.4 (L) 09/21/2015   HCT 36.0 09/21/2015   MCV 78.8 (L) 09/21/2015   PLT 261 09/21/2015      Chemistry      Component Value Date/Time   NA 143 03/17/2015 1757   K 4.0 03/17/2015 1757   CL 104 03/17/2015 1757   CO2 27 03/17/2015 1757   BUN 12 03/17/2015 1757   CREATININE 0.91  03/17/2015 1757   CREATININE 0.99 (H) 10/18/2014 1600      Component Value Date/Time   CALCIUM 9.6 03/17/2015 1757   ALKPHOS 55 03/17/2015 1757   AST 20 03/17/2015 1757   ALT 14 03/17/2015 1757   BILITOT 0.3 03/17/2015 1757       ASSESSMENT & PLAN:  Atypical lymphocytosis Preliminary reassessment in the last visit suggested that she may have CLL. Flow cytometry is inconclusive. I will order T cell gene rearrangement study in her next visit. Overall, she is not symptomatic. However, I recommend close observation for now. I will see her back next year for further assessment  Iron deficiency anemia The patient has history of iron deficiency anemia. Repeat iron studies are adequate but the patient remain microcytic. I will order thalassemia evaluation in her next visit She can continue taking oral iron supplement for now   Orders Placed This Encounter  Procedures  . T Cell Lymphoma, PCR  . CBC & Diff and Retic    Standing Status:   Future    Standing Expiration Date:   11/08/2016  .  Hemoglobinopathy evaluation    Standing Status:   Future    Standing Expiration Date:   11/08/2016  . T Cell Lymphoma, PCR    T Cell rearrangement study    Standing Status:   Future    Standing Expiration Date:   11/08/2016   All questions were answered. The patient knows to call the clinic with any problems, questions or concerns. No barriers to learning was detected. I spent 15 minutes counseling the patient face to face. The total time spent in the appointment was 20 minutes and more than 50% was on counseling and review of test results     [facility], [place], MD 10/05/2015 11:56 AM

## 2015-10-05 NOTE — Assessment & Plan Note (Signed)
The patient has history of iron deficiency anemia. Repeat iron studies are adequate but the patient remain microcytic. I will order thalassemia evaluation in her next visit She can continue taking oral iron supplement for now

## 2015-10-05 NOTE — Assessment & Plan Note (Signed)
Preliminary reassessment in the last visit suggested that she may have CLL. Flow cytometry is inconclusive. I will order T cell gene rearrangement study in her next visit. Overall, she is not symptomatic. However, I recommend close observation for now. I will see her back next year for further assessment

## 2015-10-08 ENCOUNTER — Telehealth: Payer: Self-pay | Admitting: Hematology and Oncology

## 2015-10-08 NOTE — Telephone Encounter (Signed)
S/w pt, gave appts 09/27/16 @ 10.45am and 10/04/16 @ 11am.

## 2015-10-14 ENCOUNTER — Other Ambulatory Visit: Payer: Self-pay | Admitting: Family Medicine

## 2015-10-14 DIAGNOSIS — I10 Essential (primary) hypertension: Secondary | ICD-10-CM

## 2015-11-01 ENCOUNTER — Ambulatory Visit (INDEPENDENT_AMBULATORY_CARE_PROVIDER_SITE_OTHER): Payer: Medicare Other | Admitting: Family Medicine

## 2015-11-01 VITALS — BP 146/73 | Temp 98.4°F | Ht 62.0 in | Wt 195.0 lb

## 2015-11-01 DIAGNOSIS — J329 Chronic sinusitis, unspecified: Secondary | ICD-10-CM

## 2015-11-01 DIAGNOSIS — J310 Chronic rhinitis: Secondary | ICD-10-CM

## 2015-11-01 DIAGNOSIS — J302 Other seasonal allergic rhinitis: Secondary | ICD-10-CM

## 2015-11-01 DIAGNOSIS — R059 Cough, unspecified: Secondary | ICD-10-CM

## 2015-11-01 DIAGNOSIS — R05 Cough: Secondary | ICD-10-CM | POA: Diagnosis not present

## 2015-11-01 MED ORDER — MOMETASONE FUROATE 50 MCG/ACT NA SUSP: 2.0000 | Freq: Every day | NASAL | 12 refills | Status: DC | PRN

## 2015-11-01 MED ORDER — AZITHROMYCIN 250 MG PO TABS: ORAL_TABLET | ORAL | 0 refills | Status: DC

## 2015-11-01 NOTE — Patient Instructions (Signed)
I have sent in a Zpak for you to take over the next 5 days.  If your symptoms worsen, or do not improve, come back to see me.  - Get plenty of rest and drink plenty of fluids. - Try to breathe moist air. Use a humidifier or take a steamy shower. - Consume warm fluids (soup or tea) to provide relief for a stuffy nose and to loosen phlegm. - For nasal stuffiness, try saline nasal spray or a Neti Pot. - For sore throat pain relief: suck on throat lozenges, hard candy or popsicles; gargle with warm salt water (1/4 tsp. salt per 8 oz. of water); and eat soft, bland foods. - Eat a well-balanced diet. If you cannot, ensure you are getting enough nutrients by taking a daily multivitamin. - Avoid dairy products, as they can thicken phlegm. - Avoid alcohol, as it impairs your body's immune system.  CONTACT YOUR DOCTOR IF YOU EXPERIENCE ANY OF THE FOLLOWING: - High fever - Ear pain - Sinus-type headache - Unusually severe cold symptoms - Cough that gets worse while other cold symptoms improve - Flare up of any chronic lung problem, such as asthma - Your symptoms persist longer than 2 weeks

## 2015-11-01 NOTE — Progress Notes (Signed)
   Subjective: [id number] Amber [name] is a 75 y.o. female presenting to clinic today for same day appointment. PCP: Ronnie Doss, DO Concerns today include:  1. Congestion Patient reports that she has been having nasal and chest congestion, dry cough, rhinorrhea, chills, malaise for the last 3 weeks.  Denies fevers, headaches, myalgias, diarrhea, vomiting, SOB, wheeze.  She has been using Allegra with little improvement.  She has been hydrating well.  Denies sick contacts.  Social History Reviewed: non smoker. FamHx and MedHx reviewed.  Please see EMR. Health Maintenance: flu shot due, will defer for now.  ROS: Per HPI  Objective: Office vital signs reviewed. BP (!) 146/73 (BP Location: Left Arm, Patient Position: Sitting, Cuff Size: Normal)   Temp 98.4 F (36.9 C) (Oral)   Ht 5\' 2"  (1.575 m)   Wt 195 lb (88.5 kg)   SpO2 100%   BMI 35.67 kg/m   Physical Examination:  General: Awake, alert, well nourished, tired appearing. No acute distress HEENT: Normal    Neck: No masses palpated. No lymphadenopathy    Ears: Tympanic membranes intact, normal light reflex, no erythema, no bulging    Eyes: PERRLA, EOMI    Nose: nasal turbinates moist, edematous, no purulence.    Throat: moist mucus membranes, no erythema, no cobblestoning  Cardio: regular rate and rhythm, S1S2 heard, no murmurs appreciated Pulm: clear to auscultation bilaterally, no wheezes, rhonchi or rales, normal WOB on room air  Assessment/ Plan: 75 y.o. female   1. Rhinosinusitis.  Given duration and recent dx of CLL, empiric treatment with abx warranted.  Last EKG reviewed.  Last Onc note reviewed. - azithromycin (ZITHROMAX) 250 MG tablet; Take 2 tablets (500mg ) day 1, then take 1 tablet (250mg ) daily for 4 days.  Dispense: 6 tablet; Refill: 0 - Continue home care with fluids, rest - Strict return precautions reviewed  2. Cough - azithromycin (ZITHROMAX) 250 MG tablet; Take 2 tablets (500mg ) day  1, then take 1 tablet (250mg ) daily for 4 days.  Dispense: 6 tablet; Refill: 0 - Supportive care  3. Other seasonal allergic rhinitis - mometasone (NASONEX) 50 MCG/ACT nasal spray; Place 2 sprays into the nose daily as needed. For allergy symptoms  Dispense: 17 g; Refill: 12  Follow up if no improvement or if worsening symptoms.  Precepted with Dr Erick Alley, DO PGY-3, [facility]

## 2015-11-07 ENCOUNTER — Telehealth: Payer: Self-pay | Admitting: *Deleted

## 2015-11-07 NOTE — Telephone Encounter (Signed)
Prior Authorization received from [employer] for Nasonex 50 mcg/AC.  PA form placed in provider box for completion. Derl Barrow, RN

## 2015-11-10 NOTE — Telephone Encounter (Signed)
Completed and returned to Tamika.  

## 2015-11-10 NOTE — Telephone Encounter (Signed)
Received a fax from [employer] stating there is missing information for Nasonex prior authorization.  Form placed in provider box for review.  Derl Barrow, RN

## 2015-11-10 NOTE — Telephone Encounter (Signed)
PA form for Nasonex faxed to [employer] for review.  Review process could take 24-72 hours to complete.  Derl Barrow, RN

## 2015-11-13 NOTE — Telephone Encounter (Signed)
PA for Nasonex was denied via [employer].  Denial placed in provider box for review.  PA reference number: [id number]. Derl Barrow, RN

## 2015-11-13 NOTE — Telephone Encounter (Signed)
Will complete when I am in office this week.

## 2015-11-15 ENCOUNTER — Other Ambulatory Visit: Payer: Self-pay | Admitting: Family Medicine

## 2015-11-15 MED ORDER — TRIAMCINOLONE ACETONIDE 55 MCG/ACT NA AERO: 2.0000 | INHALATION_SPRAY | Freq: Every day | NASAL | 12 refills | Status: DC

## 2015-11-16 ENCOUNTER — Other Ambulatory Visit: Payer: Self-pay | Admitting: Family Medicine

## 2015-11-23 ENCOUNTER — Telehealth: Payer: Self-pay | Admitting: Family Medicine

## 2015-11-23 NOTE — Telephone Encounter (Signed)
Pt needs a refill on metformin, pt ran out last week. Pt uses [facility]. Please advise. Thanks! ep

## 2015-11-23 NOTE — Telephone Encounter (Signed)
This was filled 11/16/15 for 3 month supply.  Please have patient call her pharmacy.

## 2015-11-23 NOTE — Telephone Encounter (Signed)
Called pt. Phone rang with no option to leave a message. If pt calls, please give her the info below. Ottis Stain, CMA

## 2015-11-28 ENCOUNTER — Ambulatory Visit (INDEPENDENT_AMBULATORY_CARE_PROVIDER_SITE_OTHER): Payer: Medicare Other | Admitting: *Deleted

## 2015-11-28 DIAGNOSIS — Z23 Encounter for immunization: Secondary | ICD-10-CM | POA: Diagnosis present

## 2015-12-14 DIAGNOSIS — H34811 Central retinal vein occlusion, right eye, with macular edema: Secondary | ICD-10-CM | POA: Diagnosis not present

## 2015-12-14 LAB — HM DIABETES EYE EXAM

## 2015-12-19 ENCOUNTER — Encounter: Payer: Self-pay | Admitting: Family Medicine

## 2015-12-19 ENCOUNTER — Ambulatory Visit (INDEPENDENT_AMBULATORY_CARE_PROVIDER_SITE_OTHER): Payer: Medicare Other | Admitting: Family Medicine

## 2015-12-19 DIAGNOSIS — M791 Myalgia, unspecified site: Secondary | ICD-10-CM

## 2015-12-19 MED ORDER — NAPROXEN 500 MG PO TABS: 500.0000 mg | ORAL_TABLET | Freq: Two times a day (BID) | ORAL | 0 refills | Status: DC

## 2015-12-19 NOTE — Progress Notes (Signed)
   CC: side pain  HPI Pain over L upper back and some nausea. Started 1 week ago. Been taking tylenol. Aching pain. Never had something like this before. No history of shingles. Was lifting grocery bags out of the car, started 2 days later. No cardiac history that she knows of. No creams to the area I think was probably taking her: All negative for all fingers issk Lahey you. No new SOB. No worsening with exertion. Deep soreness.   Nausea - has GERD, supposed to be on PPI but can't afford it. She is nauseous prior to meals, eat small meals, nausea unchanged. This has been present for 3 or 4 days. No vomiting or diarrhea.  CC, SH/smoking status, and VS noted  Objective: BP (!) 148/66 (BP Location: Right Arm, Patient Position: Sitting, Cuff Size: Normal)   Pulse 60   Temp 97.8 F (36.6 C) (Oral)   Wt 190 lb 12.8 oz (86.5 kg)   BMI 34.90 kg/m  Gen: NAD, alert, cooperative, and pleasant. HEENT: NCAT, EOMI, PERRL CV: RRR, no murmur Resp: CTAB, no wheezes, non-labored Abd: SNTND, BS present, no guarding or organomegaly Ext: No edema, warm Neuro: Alert and oriented, Speech clear, No gross deficits MSK: exquisitely tender over L upper middle back in distribution of latissimus dorsi, approaching paraspinal muscles. No overlying redness, rashes, or wounds.  Assessment and plan:  Musculoskeletal back pain: Reproducible on exam with palpation over distal trapezius insertion and superior latissimus dorsi. Counseled the patient to be aggressive with use of a heating pad. She can continue to use Tylenol, and also explained that she can trial naproxen. She expresses understanding to discontinue the naproxen if her nausea worsens. Return precautions given for worsening of pain with exertion, radiation of pain, new shortness of breath.  Nausea: I think this is possibly her underlying reflux, as she has not been able to afford her medicine for quite some time. Scheduled her a follow-up appointment next  week with her PCP to discuss nausea. I will acknowledge that NSAIDs aren't not ideal for reflux, but patient really wanted to try something more than Tylenol. She expresses understanding to discontinue her naproxen if nausea worsens. Naproxen written for short course. Instructed her to be sure to take naproxen with decent sized meals. Return precautions given for inability to take by mouth, worsening nausea.  Meds ordered this encounter  Medications  . naproxen (NAPROSYN) 500 MG tablet    Sig: Take 1 tablet (500 mg total) by mouth 2 (two) times daily with a meal.    Dispense:  14 tablet    Refill:  0    Ralene Ok, MD, PGY1 12/19/2015 5:05 PM

## 2015-12-19 NOTE — Patient Instructions (Signed)
I think you have some muscle tightness going on in your back. Please try a heating pad, or a microwaved sock with rice in it, as well as the naprosyn. Come back if the pain continues to get worse, if it worsens while exerting yourself, or if the pain moves around.

## 2015-12-26 ENCOUNTER — Ambulatory Visit: Payer: Medicare Other | Admitting: Family Medicine

## 2016-01-02 ENCOUNTER — Encounter: Payer: Self-pay | Admitting: Family Medicine

## 2016-01-02 ENCOUNTER — Ambulatory Visit (INDEPENDENT_AMBULATORY_CARE_PROVIDER_SITE_OTHER): Payer: Medicare Other | Admitting: Family Medicine

## 2016-01-02 VITALS — BP 151/86 | HR 59 | Temp 98.0°F | Ht 62.0 in | Wt 194.0 lb

## 2016-01-02 DIAGNOSIS — E119 Type 2 diabetes mellitus without complications: Secondary | ICD-10-CM

## 2016-01-02 DIAGNOSIS — G8929 Other chronic pain: Secondary | ICD-10-CM | POA: Diagnosis not present

## 2016-01-02 DIAGNOSIS — J019 Acute sinusitis, unspecified: Secondary | ICD-10-CM

## 2016-01-02 DIAGNOSIS — K219 Gastro-esophageal reflux disease without esophagitis: Secondary | ICD-10-CM | POA: Diagnosis not present

## 2016-01-02 DIAGNOSIS — M25512 Pain in left shoulder: Secondary | ICD-10-CM

## 2016-01-02 MED ORDER — DOXYCYCLINE HYCLATE 100 MG PO TABS: 100.0000 mg | ORAL_TABLET | Freq: Two times a day (BID) | ORAL | 0 refills | Status: DC

## 2016-01-02 NOTE — Patient Instructions (Signed)
If you continue to have a cough after finishing the antibiotic, we will plan to get a chest xray.  If you develop fever, worsening cough, shortness of breath, come back to be seen.  If your shoulder pain returns, come in for evaluation.

## 2016-01-02 NOTE — Progress Notes (Signed)
Subjective: CC: cough, DM2, GERD HPI: Amber [name] is a 75 y.o. female presenting to clinic today for follow up appt. Concerns today include:  1. Left shoulder pain Patient reports that pain has resolved.  She never picked up the Naproxen.  She denies weakness, numbness.  No history of injury.  2. GERD Not on PPI.  Using Rolaids with good results.  Denies bloating, nausea, vomiting, abdominal pain.  3. Cough Patient reports that cough is productive.  She notes she has had a cough for about 2 weeks.  She notes that her symptoms did improve several months ago when placed on Zpak.  She denies fevers, nasal purulence, sore throat, headache.  She reports rhinorrhea, sinus congestion.  She is eating and drinking normally.  Denies diarrhea, vomiting.  4. Diabetes:  Taking medications: Metformin 500mg  BID ROS: denies fever, chills, dizziness, LOC, polyuria, polydipsia, numbness or tingling in extremities or chest pain. Last eye exam: 12/2015 Last foot exam: 06/2014 Last A1c: 08/2015, 6.1 Nephropathy screen indicated?: on ARB Last flu, zoster and/or pneumovax: UTD  Social History Reviewed: non smoker. FamHx and MedHx reviewed.  Please see EMR. Health Maintenance: colonoscopy due  ROS: Per HPI  Objective: Office vital signs reviewed. BP (!) 151/86   Pulse (!) 59   Temp 98 F (36.7 C) (Oral)   Ht 5\' 2"  (1.575 m)   Wt 194 lb (88 kg)   SpO2 98%   BMI 35.48 kg/m   Physical Examination:  General: Awake, alert, overweight, nontoxic appearing, No acute distress HEENT: Normal, no sinus TTP    Neck: No masses palpated. No lymphadenopathy    Ears: Tympanic membranes intact, normal light reflex, no erythema, no bulging    Eyes: PERRLA, EOMI    Nose: nasal turbinates moist    Throat: moist mucus membranes, no erythema Cardio: regular rate and rhythm, S1S2 heard, no murmurs appreciated Pulm: clear to auscultation bilaterally, no wheezes, rhonchi or rales, normal WOB on room  air MSK: Full AROM of bilateral UE, 5/5 strength in all planes, light touch sensation in tact. No bony abnormalities  Diabetic Foot Form - Detailed   Diabetic Foot Exam - detailed Diabetic Foot exam was performed with the following findings:  Yes 01/02/2016  2:45 PM  Visual Foot Exam completed.:  Yes  Is there a history of foot ulcer?:  No Can the patient see the bottom of their feet?:  Yes Are the shoes appropriate in style and fit?:  Yes Is there swelling or and abnormal foot shape?:  Yes (Comment: bunion) Are the toenails long?:  Yes Are the toenails thick?:  Yes Do you have pain in calf while walking?:  No Is there a claw toe deformity?:  No Is there elevated skin temparature?:  No Is there limited skin dorsiflexion?:  No Is there foot or ankle muscle weakness?:  No Are the toenails ingrown?:  No Normal Range of Motion:  Yes Pulse Foot Exam completed.:  Yes  Right posterior Tibialias:  Present Left posterior Tibialias:  Present  Right Dorsalis Pedis:  Present Left Dorsalis Pedis:  Present  Sensory Foot Exam Completed.:  Yes Swelling:  No Semmes-Weinstein Monofilament Test R Foot Test Control:  Pos L Foot Test Control:  Pos  R Site 1-Great Toe:  Pos L Site 1-Great Toe:  Pos  R Site 4:  Pos L Site 4:  Pos  R Site 5:  Pos L Site 5:  Pos    Comments:  Normal monofilament exam.  No ulcerations or callous formation.    Assessment/ Plan: 75 y.o. female   1. Acute non-recurrent sinusitis, unspecified location.  CXR considered today given duration of cough but she is worried she will not be able to afford it.  She asks that she use abx once more and if no improvement proceed with CXR. - Will order CXR if persistent cough - doxycycline (VIBRA-TABS) 100 MG tablet; Take 1 tablet (100 mg total) by mouth 2 (two) times daily. x7  Dispense: 14 tablet; Refill: 0 - Return precautions reviewed - Follow up in 1 week or sooner if needed  2. Diabetes mellitus without complication (HCC) -  Normal monofilament exam - Continue current medication regimen  3. Gastroesophageal reflux disease without esophagitis - Controlled on Rolaids  4. Chronic left shoulder pain.  Normal exam today.  Not having pain today. - Has rx for Naproxen if pain returns - Could also consider steroid injection to shoulder if neede   Janora Norlander, DO PGY-3, [place]

## 2016-01-09 DIAGNOSIS — H34811 Central retinal vein occlusion, right eye, with macular edema: Secondary | ICD-10-CM | POA: Diagnosis not present

## 2016-01-16 DIAGNOSIS — H34811 Central retinal vein occlusion, right eye, with macular edema: Secondary | ICD-10-CM | POA: Diagnosis not present

## 2016-02-15 ENCOUNTER — Emergency Department (HOSPITAL_COMMUNITY)
Admission: EM | Admit: 2016-02-15 | Discharge: 2016-02-15 | Disposition: A | Discharge status: Home / Self Care | Payer: Medicare Other | Attending: Emergency Medicine | Admitting: Emergency Medicine

## 2016-02-15 ENCOUNTER — Encounter (HOSPITAL_COMMUNITY): Payer: Self-pay

## 2016-02-15 ENCOUNTER — Emergency Department (HOSPITAL_COMMUNITY): Payer: Medicare Other

## 2016-02-15 DIAGNOSIS — Z7984 Long term (current) use of oral hypoglycemic drugs: Secondary | ICD-10-CM | POA: Diagnosis not present

## 2016-02-15 DIAGNOSIS — M546 Pain in thoracic spine: Secondary | ICD-10-CM

## 2016-02-15 DIAGNOSIS — R0602 Shortness of breath: Secondary | ICD-10-CM | POA: Diagnosis not present

## 2016-02-15 DIAGNOSIS — J069 Acute upper respiratory infection, unspecified: Secondary | ICD-10-CM | POA: Diagnosis not present

## 2016-02-15 DIAGNOSIS — Z79899 Other long term (current) drug therapy: Secondary | ICD-10-CM | POA: Insufficient documentation

## 2016-02-15 DIAGNOSIS — R0981 Nasal congestion: Secondary | ICD-10-CM | POA: Diagnosis present

## 2016-02-15 DIAGNOSIS — E119 Type 2 diabetes mellitus without complications: Secondary | ICD-10-CM | POA: Insufficient documentation

## 2016-02-15 DIAGNOSIS — I10 Essential (primary) hypertension: Secondary | ICD-10-CM | POA: Diagnosis not present

## 2016-02-15 DIAGNOSIS — Z7982 Long term (current) use of aspirin: Secondary | ICD-10-CM | POA: Diagnosis not present

## 2016-02-15 MED ORDER — GUAIFENESIN 100 MG/5ML PO LIQD: 100.0000 mg | ORAL | 0 refills | Status: DC | PRN

## 2016-02-15 MED ORDER — BENZONATATE 100 MG PO CAPS: 100.0000 mg | ORAL_CAPSULE | Freq: Three times a day (TID) | ORAL | 0 refills | Status: DC | PRN

## 2016-02-15 NOTE — ED Triage Notes (Signed)
Pt reports nasal congestion and mid back pain. She also reports she feels as though her ears are stopped up. Pt denies chest pain. No resp distress noted. Pt reports taking abx for URI with no improvement.

## 2016-02-15 NOTE — ED Provider Notes (Signed)
[facility] DEPT Provider Note   CSN: [id number] Arrival date & time: 02/15/16  [id number]     History   Chief Complaint Chief Complaint  Patient presents with  . Nasal Congestion  . Back Pain    HPI Amber [name] is a 76 y.o. female.  HPI  76 year old female presents with 2 days of cough and chest and nasal congestion. Patient states that she has some shortness of breath, feels like she can't get a full breath. However when she walks this does not worsen and she feels relatively comfortable. Some shortness of breath increases when lying flat. No headache or facial pain. No sore throat. Her ears feel full and occasionally pop. She has not had any fevers during this time. There is no chest pain. She has chronic thoracic back/trapezius pain but this is not worse than typical. She told triage she was on antibiotics but clarifies this to me as having taken antibiotics back in November for similar symptoms. Denies any weakness or numbness in her extremities. No leg swelling.  Past Medical History:  Diagnosis Date  . Anemia   . Arthritis   . Back pain   . Blood transfusion without reported diagnosis   . CLL (chronic lymphocytic leukemia) ([place]) 09/21/2015  . Diabetes mellitus   . Hyperlipidemia   . Hypertension   . Palpitations     Patient Active Problem List   Diagnosis Date Noted  . Microcytic anemia 10/05/2015  . Meralgia paraesthetica 10/04/2015  . Atypical lymphocytosis 09/21/2015  . Left arm weakness 08/18/2015  . Dizziness 12/27/2014  . Chronic night sweats 10/18/2014  . Greater trochanteric bursitis of right hip 07/06/2013  . Preventive measure 04/12/2013  . Palpitations 05/01/2012  . Chronic sinusitis 09/02/2011  . Allergic rhinitis 06/30/2007  . GERD 06/05/2006  . Diabetes mellitus without complication ([place]) [id number]  . Hyperlipidemia associated with type 2 diabetes mellitus ([place]) 04/03/2006  . OBESITY, NOS 04/03/2006  . Iron deficiency anemia 04/03/2006    . HYPERTENSION, BENIGN SYSTEMIC 04/03/2006  . MITRAL VALVE PROLAPSE 04/03/2006  . OSTEOARTHRITIS, MULTI SITES 04/03/2006  . Proteinuria 04/03/2006    Past Surgical History:  Procedure Laterality Date  . ABDOMINAL HYSTERECTOMY    . COLON SURGERY      OB History    No data available       Home Medications    Prior to Admission medications   Medication Sig Start Date End Date Taking? Authorizing Provider  aspirin EC 81 MG tablet Take 81 mg by mouth daily.   Yes Historical Provider, MD  losartan-hydrochlorothiazide (HYZAAR) 100-12.5 MG tablet TAKE ONE TABLET BY MOUTH ONCE DAILY 10/16/15  Yes Ashly Windell Moulding, DO  metFORMIN (GLUCOPHAGE) 500 MG tablet TAKE ONE TABLET BY MOUTH TWICE DAILY WITH  MEALS 11/16/15  Yes Ashly M Gottschalk, DO  metoprolol (LOPRESSOR) 50 MG tablet TAKE ONE TABLET BY MOUTH TWICE DAILY 10/16/15  Yes Ashly Windell Moulding, DO  Multiple Vitamins-Minerals (WOMENS ONE DAILY) TABS Take 1 tablet by mouth daily.   Yes Historical Provider, MD  Polyethyl Glycol-Propyl Glycol (SYSTANE OP) Place 2 drops into both eyes 2 (two) times daily.    Yes Historical Provider, MD  pravastatin (PRAVACHOL) 40 MG tablet TAKE ONE TABLET BY MOUTH ONCE DAILY 09/29/15  Yes Ashly M Gottschalk, DO  timolol (BETIMOL) 0.5 % ophthalmic solution Place 1 drop into the right eye 2 (two) times daily. Patient taking differently: Place 2 drops into both eyes daily.  04/12/13  Yes Gerda Diss, DO  acetaminophen (TYLENOL) 500 MG tablet Take 1 tablet (500 mg total) by mouth every 6 (six) hours as needed. Patient not taking: Reported on 02/15/2016 03/18/15   Antonietta Breach, PA-C  benzonatate (TESSALON) 100 MG capsule Take 1 capsule (100 mg total) by mouth 3 (three) times daily as needed for cough. 02/15/16   Sherwood Gambler, MD  guaiFENesin (ROBITUSSIN) 100 MG/5ML liquid Take 5-10 mLs (100-200 mg total) by mouth every 4 (four) hours as needed for cough. 02/15/16   Sherwood Gambler, MD  naproxen (NAPROSYN) 500 MG  tablet Take 1 tablet (500 mg total) by mouth 2 (two) times daily with a meal. Patient not taking: Reported on 02/15/2016 12/19/15   Sela Hilding, MD  triamcinolone (NASACORT AQ) 55 MCG/ACT AERO nasal inhaler Place 2 sprays into the nose daily. Patient not taking: Reported on 02/15/2016 11/15/15   Janora Norlander, DO  zoster vaccine live, PF, (ZOSTAVAX) 16109 UNT/0.65ML injection Inject 19,400 Units into the skin once. Patient not taking: Reported on 02/15/2016 03/23/14   Janora Norlander, DO    Family History Family History  Problem Relation Age of Onset  . Pancreatic cancer Sister     3 sisters died of it  . Heart attack Father 26    died  . Diabetes Mother   . Cancer Mother   . Hypertension Son   . Congestive Heart Failure Son   . Hypertension Daughter     Social History Social History  Substance Use Topics  . Smoking status: Never Smoker  . Smokeless tobacco: Former Systems developer    Quit date: 02/05/1983  . Alcohol use No     Allergies   Amoxicillin; Codeine; Penicillins; Sulfamethoxazole; and Sulfonamide derivatives   Review of Systems Review of Systems  Constitutional: Negative for fever.  HENT: Positive for congestion and ear pain. Negative for sore throat.   Respiratory: Positive for cough and shortness of breath.   Cardiovascular: Negative for chest pain and leg swelling.  Musculoskeletal: Positive for back pain.  All other systems reviewed and are negative.    Physical Exam Updated Vital Signs BP 141/67 (BP Location: Right Arm)   Pulse (!) 55   Temp 97.6 F (36.4 C) (Oral)   Resp 16   SpO2 99%   Physical Exam  Constitutional: She is oriented to person, place, and time. She appears well-developed and well-nourished. No distress.  HENT:  Head: Normocephalic and atraumatic.  Right Ear: Tympanic membrane, external ear and ear canal normal.  Left Ear: Tympanic membrane, external ear and ear canal normal.  Nose: Nose normal.  Mouth/Throat: Oropharynx is  clear and moist.  Nasal congestion  Eyes: Right eye exhibits no discharge. Left eye exhibits no discharge.  Neck: Normal range of motion. Neck supple.  No tenderness over neck or upper back/trapezius Normal ROM of neck  Cardiovascular: Normal rate, regular rhythm and normal heart sounds.   Pulses:      Radial pulses are 2+ on the right side, and 2+ on the left side.       Dorsalis pedis pulses are 2+ on the right side, and 2+ on the left side.  Pulmonary/Chest: Effort normal and breath sounds normal. She has no wheezes. She has no rales.  Abdominal: Soft. There is no tenderness.  Musculoskeletal: She exhibits no edema.  5/5 strength in all 4 extremities  Neurological: She is alert and oriented to person, place, and time.  Skin: Skin is warm and dry. She is not diaphoretic.  Nursing note and vitals reviewed.  ED Treatments / Results  Labs (all labs ordered are listed, but only abnormal results are displayed) Labs Reviewed - No data to display  EKG  EKG Interpretation  Date/Time:  Thursday February 15 2016 10:04:25 EST Ventricular Rate:  60 PR Interval:  174 QRS Duration: 88 QT Interval:  406 QTC Calculation: 406 R Axis:   31 Text Interpretation:  Normal sinus rhythm Normal ECG no significant change since April 2017 Confirmed by Regenia Skeeter MD, Darrnell Mangiaracina [phone]) on 02/15/2016 10:22:29 AM       Radiology Dg Chest 2 View  Result Date: 02/15/2016 CLINICAL DATA:  Short of breath and congestion since last night EXAM: CHEST  2 VIEW COMPARISON:  None. FINDINGS: Mild cardiomegaly. Normal vascularity. No pneumothorax or pleural effusion. IMPRESSION: No active cardiopulmonary disease. Electronically Signed   By: Marybelle Killings M.D.   On: 02/15/2016 10:24    Procedures Procedures (including critical care time)  Medications Ordered in ED Medications - No data to display   Initial Impression / Assessment and Plan / ED Course  I have reviewed the triage vital signs and the nursing  notes.  Pertinent labs & imaging results that were available during my care of the patient were reviewed by me and considered in my medical decision making (see chart for details).  Clinical Course     Patient appears to have a viral URI. No wheezes or rales on exam. No increased work of breathing or hypoxia. There is no chest pain. ECG unremarkable. Chest x-ray without signs of pneumonia. At this point there is no indication of a bacterial illness, will treat symptomatically for her cough and congestion. Her back pain is a chronic issue and appears to be muscular. While she is hypertensive, I have a very low suspicion of dissection. No evidence of fluid overload. Overall well appearing. F/u with PCP. Discussed return precautions.  Final Clinical Impressions(s) / ED Diagnoses   Final diagnoses:  Viral upper respiratory tract infection  Acute bilateral thoracic back pain    New Prescriptions New Prescriptions   BENZONATATE (TESSALON) 100 MG CAPSULE    Take 1 capsule (100 mg total) by mouth 3 (three) times daily as needed for cough.   GUAIFENESIN (ROBITUSSIN) 100 MG/5ML LIQUID    Take 5-10 mLs (100-200 mg total) by mouth every 4 (four) hours as needed for cough.     Sherwood Gambler, MD 02/15/16 1113

## 2016-02-23 ENCOUNTER — Ambulatory Visit: Payer: Medicare Other | Admitting: Family Medicine

## 2016-03-08 DIAGNOSIS — H01001 Unspecified blepharitis right upper eyelid: Secondary | ICD-10-CM | POA: Diagnosis not present

## 2016-03-08 DIAGNOSIS — H01004 Unspecified blepharitis left upper eyelid: Secondary | ICD-10-CM | POA: Diagnosis not present

## 2016-03-08 DIAGNOSIS — H04123 Dry eye syndrome of bilateral lacrimal glands: Secondary | ICD-10-CM | POA: Diagnosis not present

## 2016-03-19 ENCOUNTER — Ambulatory Visit (INDEPENDENT_AMBULATORY_CARE_PROVIDER_SITE_OTHER): Payer: Medicare Other | Admitting: Family Medicine

## 2016-03-19 ENCOUNTER — Encounter: Payer: Self-pay | Admitting: Family Medicine

## 2016-03-19 VITALS — BP 142/74 | HR 64 | Temp 97.8°F | Ht 62.0 in | Wt 199.0 lb

## 2016-03-19 DIAGNOSIS — M25551 Pain in right hip: Secondary | ICD-10-CM | POA: Insufficient documentation

## 2016-03-19 DIAGNOSIS — I10 Essential (primary) hypertension: Secondary | ICD-10-CM

## 2016-03-19 DIAGNOSIS — E119 Type 2 diabetes mellitus without complications: Secondary | ICD-10-CM | POA: Diagnosis present

## 2016-03-19 LAB — POCT GLYCOSYLATED HEMOGLOBIN (HGB A1C): HEMOGLOBIN A1C: 5.9

## 2016-03-19 MED ORDER — METOPROLOL TARTRATE 50 MG PO TABS: 50.0000 mg | ORAL_TABLET | Freq: Two times a day (BID) | ORAL | 4 refills | Status: DC

## 2016-03-19 MED ORDER — METFORMIN HCL 500 MG PO TABS: 500.0000 mg | ORAL_TABLET | Freq: Two times a day (BID) | ORAL | 4 refills | Status: DC

## 2016-03-19 MED ORDER — METOPROLOL TARTRATE 25 MG PO TABS: 12.5000 mg | ORAL_TABLET | Freq: Two times a day (BID) | ORAL | 4 refills | Status: DC

## 2016-03-19 MED ORDER — ZOSTER VACCINE LIVE 19400 UNT/0.65ML ~~LOC~~ SUSR: 0.6500 mL | Freq: Once | SUBCUTANEOUS | 0 refills | Status: AC

## 2016-03-19 NOTE — Assessment & Plan Note (Signed)
Patient's dose reduced to 12.5mg  back in 03/2015.  For some reason EMR not reflecting this dose change.  Patient has been taking 50mg  daily.  Since this is a 12 hour medication.  Will change rx to reflect 12.5mg  BID dosing.  She will monitor BP at home.  If BP >150/90, she will call and I will increase this to 25mg  BID and titrate according to BP/ as tolerated.

## 2016-03-19 NOTE — Assessment & Plan Note (Signed)
Seems to be arthritic in nature.  2017 CT abd/pelvis reviewed and there were no significant osseous abnormalities on that study.  No red flags on exam.  Some hip flexor and aBductor weakness. Home exercises given to help with this.  Tylenol 650mg  q8 prn.  Ok to use topicals/ heat if this helps.  Return precautions reviewed.

## 2016-03-19 NOTE — Assessment & Plan Note (Signed)
Controlled.  A1c 5.9 today.  Will consider possibly discontinuing vs reducing Metformin to once daily given age if persistently below 6.  For now continue current dose.  Plan to follow up in 3 months

## 2016-03-19 NOTE — Patient Instructions (Addendum)
Because you are only taking 1 tablet of your Metoprolol a day, I have sent in a new prescription for the 25mg  tablets.  I want you to take 1/2 tablet twice daily for BP control.  We can increase this if your blood pressure gets too high.   For your right hip, I have given you some lateral leg raises to do.  This will help strengthen your hip and reduce hip pain.  You can use topical analgesics like Bengay or Blu Emu on the hip.  Tylenol 650mg  every 8 hours is ok to use as well.  Continue to use heat if this helps with pain.    Follow up in 3 months or sooner if needed.

## 2016-03-19 NOTE — Progress Notes (Signed)
    Subjective: CC: DM2, pain HPI: Amber [name] is a 76 y.o. female presenting to clinic today for:  1. Diabetes:  High at home: 145 Low at home: 53 Taking medications: Metformin 500mg  BID ROS: denies fever, chills, dizziness, LOC, polyuria, polydipsia, numbness or tingling in extremities or chest pain. Last eye exam: <1 month ago Last foot exam: 12/2015 Last A1c: 08/2015, 6.1 Nephropathy screen indicated?: on ARB Last flu, zoster and/or pneumovax: Zostavax due  2. Pain Patient reports pain on right side near her groin area but does not necessarily travel down into the groin/ vagina.  She reports pain started about 1 week ago. She notes that pain is intermittent and lasts at most about 10-15 minutes.  Pain does not interfere with daily activity.  She reports that warm baths make it better.  She describes pain as an achy pain.  She associates pain with cold weather.  Reports good BMs that are well formed and do not require straining.  No vaginal bleeding/ discharge.    Social Hx reviewed: non smoker. MedHx, medications and allergies reviewed.  Please see EMR. Health Maintenance: Zostavax, colonoscopy ROS: Per HPI  Objective: Office vital signs reviewed. BP (!) 142/74   Pulse 64   Temp 97.8 F (36.6 C) (Oral)   Ht 5\' 2"  (1.575 m)   Wt 199 lb (90.3 kg)   SpO2 98%   BMI 36.40 kg/m   Physical Examination:  General: Awake, alert, well nourished, No acute distress HEENT: Normal    Eyes: EOMI, sclera white Cardio: regular rate and rhythm, S1S2 heard, no murmurs appreciated Pulm: clear to auscultation bilaterally, no wheezes, rhonchi or rales; normal work of breathing on room air MSK: normal gait and station.  Full painless AROM in flexion and extension of hip, slight decrease in IR and ER of hip.  4/5 hip in all fields but most notable in aBduction and flexion.   Results for orders placed or performed in visit on 03/19/16 (from the past 24 hour(s))  HgB A1c     Status:  None   Collection Time: 03/19/16  1:50 PM  Result Value Ref Range   Hemoglobin A1C 5.9     Assessment/ Plan: 76 y.o. female   Diabetes mellitus without complication (HCC) Controlled.  A1c 5.9 today.  Will consider possibly discontinuing vs reducing Metformin to once daily given age if persistently below 6.  For now continue current dose.  Plan to follow up in 3 months  HYPERTENSION, BENIGN SYSTEMIC Patient's dose reduced to 12.5mg  back in 03/2015.  For some reason EMR not reflecting this dose change.  Patient has been taking 50mg  daily.  Since this is a 12 hour medication.  Will change rx to reflect 12.5mg  BID dosing.  She will monitor BP at home.  If BP >150/90, she will call and I will increase this to 25mg  BID and titrate according to BP/ as tolerated.  Right hip pain Seems to be arthritic in nature.  2017 CT abd/pelvis reviewed and there were no significant osseous abnormalities on that study.  No red flags on exam.  Some hip flexor and aBductor weakness. Home exercises given to help with this.  Tylenol 650mg  q8 prn.  Ok to use topicals/ heat if this helps.  Return precautions reviewed.   Janora Norlander, DO PGY-3, [facility]

## 2016-03-28 DIAGNOSIS — H5202 Hypermetropia, left eye: Secondary | ICD-10-CM | POA: Diagnosis not present

## 2016-03-28 DIAGNOSIS — H40053 Ocular hypertension, bilateral: Secondary | ICD-10-CM | POA: Diagnosis not present

## 2016-03-28 DIAGNOSIS — H04123 Dry eye syndrome of bilateral lacrimal glands: Secondary | ICD-10-CM | POA: Diagnosis not present

## 2016-03-30 ENCOUNTER — Telehealth: Payer: Self-pay | Admitting: Family Medicine

## 2016-03-30 NOTE — Telephone Encounter (Signed)
Family Medicine After hours phone call  Patient is calling the after hours phone line complaining of some dizziness. She is having some associated nausea with this dizziness. She states that symptoms began late yesterday and early this morning. They have persisted since. She states that she otherwise feels well. No fevers, cough, headache, vomiting or diarrhea. She denies any chest pain or peripheral edema. She is able to eat and drink without issue. She is asking if there is anything we can do or define no what is causing her issues.  Patient states that her CBG was recently 106, which was a relatively normal value for her. She does not have a blood pressure cuff to check her blood pressure.  After additional discussion I learned that patient had recently had her metoprolol increased by her PCP last week. I asked her to decrease this dose to what she had been taking previously. I also asked that she focus on taking in fluids. I advised her to schedule an appointment with her PCP next week.  Patient stated her understanding of these recommendations. She thanked me for this advice and had no additional questions. Patient was informed that if her symptoms persisted or worsened when she should have a loved one take her for evaluation in the ED (or worse case scenario, calling EMS).   Elberta Leatherwood, MD,MS,  PGY3 03/30/2016 5:21 PM

## 2016-05-20 ENCOUNTER — Ambulatory Visit: Payer: Medicare Other | Admitting: Family Medicine

## 2016-05-23 ENCOUNTER — Encounter: Payer: Self-pay | Admitting: Family Medicine

## 2016-05-23 ENCOUNTER — Ambulatory Visit (INDEPENDENT_AMBULATORY_CARE_PROVIDER_SITE_OTHER): Payer: Medicare Other | Admitting: Family Medicine

## 2016-05-23 VITALS — BP 138/64 | HR 54 | Temp 97.8°F | Ht 62.0 in | Wt 197.8 lb

## 2016-05-23 DIAGNOSIS — R6889 Other general symptoms and signs: Secondary | ICD-10-CM | POA: Diagnosis not present

## 2016-05-23 DIAGNOSIS — D72820 Lymphocytosis (symptomatic): Secondary | ICD-10-CM | POA: Diagnosis not present

## 2016-05-23 DIAGNOSIS — Z862 Personal history of diseases of the blood and blood-forming organs and certain disorders involving the immune mechanism: Secondary | ICD-10-CM

## 2016-05-23 NOTE — Progress Notes (Signed)
   Subjective: CC: concern that hgb is low [id number] Amber [name] is a 76 y.o. female presenting to clinic today for same day appointment. PCP: Ronnie Doss, DO Concerns today include:  Patient reports she have been feeling cold and hot for about 1 week.  She denies difficulty swallowing, change in voice.  She denies diarrhea, constipation.  She has had allergy symptoms but no recollection of recent illness.  She reports occ pedal edema if she is one position for a long time.  She denies fatigue, pallor.  She denies cough, congestion, fevers, chills, nausea, vomiting, diarrhea.  She reports nasal congestion relieved by Allegra.  She reports that the Metoprolol 12.5mg  BID was causing her to have a headache.  She reports her BP was elevated on this as well.  She reports that she switched back to the Metoprolol 50mg  once daily.  She reports symptoms have improved and she is feeling much better.  She denies dizziness, hypotension, blurry vision outside of normal, falls, LOC.  Allergies  Allergen Reactions  . Amoxicillin     REACTION: arrythmia  . Codeine Other (See Comments)    Dry mouth  . Penicillins Hives  . Sulfamethoxazole Hives and Nausea And Vomiting    REACTION: urticaria (hives) & weakness  . Sulfonamide Derivatives Hives and Nausea And Vomiting    Weakness    Social Hx reviewed: non smoker. MedHx, current medications and allergies reviewed.  Please see EMR. ROS: Per HPI  Objective: Office vital signs reviewed. BP 138/64   Pulse (!) 54   Temp 97.8 F (36.6 C) (Oral)   Ht 5\' 2"  (1.575 m)   Wt 197 lb 12.8 oz (89.7 kg)   SpO2 96%   BMI 36.18 kg/m   Physical Examination:  General: Awake, alert, well nourished, No acute distress HEENT: Normal    Neck: No masses palpated. No lymphadenopathy. Area of thyroid tender to palpation.  No obvious enlargement or nodules.  No overlying skin changes    Eyes: EOMI, sclera white, no conjunctival pallor    Nose: nasal turbinates  moist, no nasal discharge    Throat: moist mucus membranes, no erythema, no tonsillar exudate.  Airway is patent Cardio: bradycardic and regular rhythm, S1S2 heard, no murmurs appreciated Pulm: clear to auscultation bilaterally, no wheezes, rhonchi or rales; normal work of breathing on room air  Assessment/ Plan: 76 y.o. female   1. Cold intolerance.  Patient with slightly tender thyroid today.  I question possible thyroiditis.  No dominant masses/ nodules appreciated. - TSH  2. History of anemia.  Has been stable last couple of checks in 2017.  Patient would like this checked today.  I think that this is reasonable. - CBC  3. Atypical lymphocytosis - Has follow up with Dr Alvy Bimler in August. - CBC - Will contact with results  Janora Norlander, DO PGY-3, [facility]

## 2016-05-23 NOTE — Patient Instructions (Signed)
I will contact you will the results of your labs.  If anything is abnormal, I will call you.  Otherwise, expect a copy to be mailed to you.

## 2016-05-24 ENCOUNTER — Encounter: Payer: Self-pay | Admitting: Family Medicine

## 2016-05-24 LAB — CBC
HEMATOCRIT: 35.4 % (ref 34.0–46.6)
Hemoglobin: 11.4 g/dL (ref 11.1–15.9)
MCH: 24.9 pg — ABNORMAL LOW (ref 26.6–33.0)
MCHC: 32.2 g/dL (ref 31.5–35.7)
MCV: 78 fL — AB (ref 79–97)
Platelets: 315 10*3/uL (ref 150–379)
RBC: 4.57 x10E6/uL (ref 3.77–5.28)
RDW: 14.8 % (ref 12.3–15.4)
WBC: 8 10*3/uL (ref 3.4–10.8)

## 2016-05-24 LAB — TSH: TSH: 2.78 u[IU]/mL (ref 0.450–4.500)

## 2016-05-27 ENCOUNTER — Telehealth: Payer: Self-pay | Admitting: Family Medicine

## 2016-05-27 NOTE — Telephone Encounter (Signed)
Pt would like someone to call her with lab results. ep

## 2016-05-27 NOTE — Telephone Encounter (Signed)
I am covering for Dr Lajuana Ripple.  The patient's blood work from last week was normal.  Amber [name]. Jerline Pain, [facility] Resident PGY-3 05/27/2016 9:48 PM

## 2016-05-28 DIAGNOSIS — H4311 Vitreous hemorrhage, right eye: Secondary | ICD-10-CM | POA: Diagnosis not present

## 2016-05-28 DIAGNOSIS — H34811 Central retinal vein occlusion, right eye, with macular edema: Secondary | ICD-10-CM | POA: Diagnosis not present

## 2016-05-28 DIAGNOSIS — H40033 Anatomical narrow angle, bilateral: Secondary | ICD-10-CM | POA: Diagnosis not present

## 2016-05-28 DIAGNOSIS — E119 Type 2 diabetes mellitus without complications: Secondary | ICD-10-CM | POA: Diagnosis not present

## 2016-05-28 LAB — HM DIABETES EYE EXAM

## 2016-05-28 NOTE — Telephone Encounter (Signed)
Tried to contact pt to inform her of below and the phone only rang and there was no option of LVM  If pt calls back please inform her of below. Katharina Caper, Husayn Reim D, [name]

## 2016-05-30 NOTE — Telephone Encounter (Signed)
Pt informed. Zimmerman Rumple, Galdino Hinchman D, CMA  

## 2016-06-03 ENCOUNTER — Other Ambulatory Visit: Payer: Self-pay | Admitting: Family Medicine

## 2016-06-03 DIAGNOSIS — I10 Essential (primary) hypertension: Secondary | ICD-10-CM

## 2016-06-03 MED ORDER — METOPROLOL TARTRATE 25 MG PO TABS: 25.0000 mg | ORAL_TABLET | Freq: Two times a day (BID) | ORAL | 0 refills | Status: DC

## 2016-06-03 NOTE — Telephone Encounter (Signed)
Pt calling to request refill of:  Name of Medication(s): metoprolol  Last date of OV:  05-23-16 Pharmacy:  [facility]  Will route refill request to Clinic RN.  Discussed with patient policy to call pharmacy for future refills.  Also, discussed refills may take up to 48 hours to approve or deny.  Renella Cunas

## 2016-06-04 DIAGNOSIS — H34811 Central retinal vein occlusion, right eye, with macular edema: Secondary | ICD-10-CM | POA: Diagnosis not present

## 2016-06-16 ENCOUNTER — Emergency Department (HOSPITAL_COMMUNITY): Payer: Medicare Other

## 2016-06-16 ENCOUNTER — Encounter (HOSPITAL_COMMUNITY): Payer: Self-pay

## 2016-06-16 ENCOUNTER — Emergency Department (HOSPITAL_COMMUNITY)
Admission: EM | Admit: 2016-06-16 | Discharge: 2016-06-16 | Disposition: A | Discharge status: Home / Self Care | Payer: Medicare Other | Attending: Emergency Medicine | Admitting: Emergency Medicine

## 2016-06-16 DIAGNOSIS — Z7982 Long term (current) use of aspirin: Secondary | ICD-10-CM | POA: Diagnosis not present

## 2016-06-16 DIAGNOSIS — I10 Essential (primary) hypertension: Secondary | ICD-10-CM | POA: Insufficient documentation

## 2016-06-16 DIAGNOSIS — Z856 Personal history of leukemia: Secondary | ICD-10-CM | POA: Insufficient documentation

## 2016-06-16 DIAGNOSIS — E119 Type 2 diabetes mellitus without complications: Secondary | ICD-10-CM | POA: Diagnosis not present

## 2016-06-16 DIAGNOSIS — R0602 Shortness of breath: Secondary | ICD-10-CM | POA: Diagnosis not present

## 2016-06-16 LAB — CBC WITH DIFFERENTIAL/PLATELET
BASOS ABS: 0 10*3/uL (ref 0.0–0.1)
BASOS PCT: 0 %
Eosinophils Absolute: 0.3 10*3/uL (ref 0.0–0.7)
Eosinophils Relative: 5 %
HEMATOCRIT: 38.1 % (ref 36.0–46.0)
HEMOGLOBIN: 11.9 g/dL — AB (ref 12.0–15.0)
Lymphocytes Relative: 55 %
Lymphs Abs: 4.2 10*3/uL — ABNORMAL HIGH (ref 0.7–4.0)
MCH: 25.1 pg — ABNORMAL LOW (ref 26.0–34.0)
MCHC: 31.2 g/dL (ref 30.0–36.0)
MCV: 80.2 fL (ref 78.0–100.0)
Monocytes Absolute: 0.4 10*3/uL (ref 0.1–1.0)
Monocytes Relative: 6 %
NEUTROS ABS: 2.5 10*3/uL (ref 1.7–7.7)
NEUTROS PCT: 34 %
Platelets: 329 10*3/uL (ref 150–400)
RBC: 4.75 MIL/uL (ref 3.87–5.11)
RDW: 14.7 % (ref 11.5–15.5)
WBC: 7.5 10*3/uL (ref 4.0–10.5)

## 2016-06-16 LAB — BASIC METABOLIC PANEL
Anion gap: 8 (ref 5–15)
BUN: 16 mg/dL (ref 6–20)
CHLORIDE: 104 mmol/L (ref 101–111)
CO2: 27 mmol/L (ref 22–32)
Calcium: 9.4 mg/dL (ref 8.9–10.3)
Creatinine, Ser: 1.01 mg/dL — ABNORMAL HIGH (ref 0.44–1.00)
GFR calc Af Amer: >60 mL/min (ref >60)
GFR calc non Af Amer: 53 mL/min — ABNORMAL LOW (ref >60)
GLUCOSE: 165 mg/dL — AB (ref 65–99)
POTASSIUM: 4.1 mmol/L (ref 3.5–5.1)
Sodium: 139 mmol/L (ref 135–145)

## 2016-06-16 LAB — I-STAT TROPONIN, ED: Troponin i, poc: 0 ng/mL (ref 0.00–0.08)

## 2016-06-16 NOTE — ED Provider Notes (Signed)
[facility] DEPT Provider Note   CSN: [id number] Arrival date & time: 06/16/16  1027     History   Chief Complaint Chief Complaint  Patient presents with  . Shortness of Breath    HPI Amber [name] is a 76 y.o. female.  Patient is a 76 year old female with extensive past medical history including CLL, hypertension, diabetes. She presents today for evaluation of dyspnea. She reports standing outside this morning speaking with her son when she became acutely dyspneic and felt as though she could not take a deep breath. This feeling lasted for approximately 5-10 minutes, then resolved spontaneously. She denies any chest pain along with the episode. She denies any recent cough, congestion, leg swelling, or other complaints. She reports this is not happened to her before.    Shortness of Breath  This is a new problem. The problem occurs continuously.Episode onset: This morning. The problem has been resolved. Pertinent negatives include no fever, no sputum production, no chest pain, no leg pain and no leg swelling. She has tried nothing for the symptoms. Associated medical issues do not include asthma, COPD, chronic lung disease, CAD or heart failure.    Past Medical History:  Diagnosis Date  . Anemia   . Arthritis   . Back pain   . Blood transfusion without reported diagnosis   . CLL (chronic lymphocytic leukemia) ([place]) 09/21/2015  . Diabetes mellitus   . Hyperlipidemia   . Hypertension   . Palpitations     Patient Active Problem List   Diagnosis Date Noted  . Right hip pain 03/19/2016  . Microcytic anemia 10/05/2015  . Meralgia paraesthetica 10/04/2015  . Atypical lymphocytosis 09/21/2015  . Left arm weakness 08/18/2015  . Dizziness 12/27/2014  . Chronic night sweats 10/18/2014  . Greater trochanteric bursitis of right hip 07/06/2013  . Preventive measure 04/12/2013  . Palpitations 05/01/2012  . Chronic sinusitis 09/02/2011  . Allergic rhinitis 06/30/2007  .  GERD 06/05/2006  . Diabetes mellitus without complication ([place]) 25/36/6440  . Hyperlipidemia associated with type 2 diabetes mellitus ([place]) 04/03/2006  . OBESITY, NOS 04/03/2006  . Iron deficiency anemia 04/03/2006  . HYPERTENSION, BENIGN SYSTEMIC 04/03/2006  . MITRAL VALVE PROLAPSE 04/03/2006  . OSTEOARTHRITIS, MULTI SITES 04/03/2006  . Proteinuria 04/03/2006    Past Surgical History:  Procedure Laterality Date  . ABDOMINAL HYSTERECTOMY    . COLON SURGERY      OB History    No data available       Home Medications    Prior to Admission medications   Medication Sig Start Date End Date Taking? Authorizing Provider  acetaminophen (TYLENOL) 500 MG tablet Take 1 tablet (500 mg total) by mouth every 6 (six) hours as needed. Patient not taking: Reported on 02/15/2016 03/18/15   Antonietta Breach, PA-C  aspirin EC 81 MG tablet Take 81 mg by mouth daily.    [provider]  losartan-hydrochlorothiazide (HYZAAR) 100-12.5 MG tablet TAKE ONE TABLET BY MOUTH ONCE DAILY 10/16/15   Ronnie Doss M, DO  metFORMIN (GLUCOPHAGE) 500 MG tablet Take 1 tablet (500 mg total) by mouth 2 (two) times daily with a meal. 03/19/16   Ronnie Doss M, DO  metoprolol tartrate (LOPRESSOR) 25 MG tablet Take 1 tablet (25 mg total) by mouth 2 (two) times daily. 06/03/16   Janora Norlander, DO  Multiple Vitamins-Minerals (WOMENS ONE DAILY) TABS Take 1 tablet by mouth daily.    [provider]  Polyethyl Glycol-Propyl Glycol (SYSTANE OP) Place 2 drops into both  eyes 2 (two) times daily.     [provider]  pravastatin (PRAVACHOL) 40 MG tablet TAKE ONE TABLET BY MOUTH ONCE DAILY 09/29/15   Ronnie Doss M, DO  timolol (BETIMOL) 0.5 % ophthalmic solution Place 1 drop into the right eye 2 (two) times daily. Patient taking differently: Place 2 drops into both eyes daily.  04/12/13   Gerda Diss, DO    Family History Family History  Problem Relation Age of Onset  . Pancreatic  cancer Sister        3 sisters died of it  . Heart attack Father 64       died  . Diabetes Mother   . Cancer Mother   . Hypertension Son   . Congestive Heart Failure Son   . Hypertension Daughter     Social History Social History  Substance Use Topics  . Smoking status: Never Smoker  . Smokeless tobacco: Former Systems developer    Quit date: 02/05/1983  . Alcohol use No     Allergies   Amoxicillin; Codeine; Penicillins; Sulfamethoxazole; and Sulfonamide derivatives   Review of Systems Review of Systems  Constitutional: Negative for fever.  Respiratory: Positive for shortness of breath. Negative for sputum production.   Cardiovascular: Negative for chest pain and leg swelling.  All other systems reviewed and are negative.    Physical Exam Updated Vital Signs BP (!) 165/75   Pulse (!) 59   Temp 97.9 F (36.6 C) (Oral)   Resp 18   Ht 5\' 2"  (1.575 m)   Wt 184 lb (83.5 kg)   SpO2 98%   BMI 33.65 kg/m   Physical Exam  Constitutional: She is oriented to person, place, and time. She appears well-developed and well-nourished. No distress.  HENT:  Head: Normocephalic and atraumatic.  Neck: Normal range of motion. Neck supple.  Cardiovascular: Normal rate and regular rhythm.  Exam reveals no gallop and no friction rub.   No murmur heard. Pulmonary/Chest: Effort normal and breath sounds normal. No respiratory distress. She has no wheezes.  Abdominal: Soft. Bowel sounds are normal. She exhibits no distension. There is no tenderness.  Musculoskeletal: Normal range of motion. She exhibits no edema.  Neurological: She is alert and oriented to person, place, and time.  Skin: Skin is warm and dry. She is not diaphoretic.  Nursing note and vitals reviewed.    ED Treatments / Results  Labs (all labs ordered are listed, but only abnormal results are displayed) Labs Reviewed  CBC WITH DIFFERENTIAL/PLATELET - Abnormal; Notable for the following:       Result Value   Hemoglobin 11.9  (*)    MCH 25.1 (*)    Lymphs Abs 4.2 (*)    All other components within normal limits  BASIC METABOLIC PANEL  I-STAT TROPOININ, ED    EKG  EKG Interpretation None       Radiology Dg Chest 2 View  Result Date: 06/16/2016 CLINICAL DATA:  Shortness of breath. EXAM: CHEST  2 VIEW COMPARISON:  February 15, 2016 FINDINGS: The heart size and mediastinal contours are within normal limits. Both lungs are clear. The visualized skeletal structures are unremarkable. IMPRESSION: No active cardiopulmonary disease. Electronically Signed   By: Dorise Bullion III M.D   On: 06/16/2016 10:04    Procedures Procedures (including critical care time)  Medications Ordered in ED Medications - No data to display   Initial Impression / Assessment and Plan / ED Course  I have reviewed the triage vital signs  and the nursing notes.  Pertinent labs & imaging results that were available during my care of the patient were reviewed by me and considered in my medical decision making (see chart for details).  Patient presents here after an episode of dyspnea that occurred just prior to arrival. She did not experience any chest pain, nausea, or diaphoresis. This episode resolved spontaneously after several minutes. She now feels fine and has no complaints. She did not want to come to the ER, however was convinced to be evaluated by family members.  Her workup reveals a clear chest x-ray, unchanged EKG, and negative laboratory studies. I highly doubt any sort of acute cardiac event. She will be discharged in the company of her son who promises to return her if symptoms recur, worsen, or change.  Final Clinical Impressions(s) / ED Diagnoses   Final diagnoses:  None    New Prescriptions New Prescriptions   No medications on file     Veryl Speak, MD 06/16/16 1127

## 2016-06-16 NOTE — ED Triage Notes (Signed)
Patient complains of inability to get a good breath this am. States that the shortness of breath worse with exertion. No CP, denies pain. Alert and oriented

## 2016-06-16 NOTE — Discharge Instructions (Signed)
Return to the emergency department if your symptoms significantly worsen or change. 

## 2016-06-21 ENCOUNTER — Encounter: Payer: Self-pay | Admitting: Family Medicine

## 2016-06-21 ENCOUNTER — Ambulatory Visit (INDEPENDENT_AMBULATORY_CARE_PROVIDER_SITE_OTHER): Payer: Medicare Other | Admitting: Family Medicine

## 2016-06-21 VITALS — BP 124/68 | HR 61 | Temp 97.9°F | Ht 62.0 in | Wt 200.6 lb

## 2016-06-21 DIAGNOSIS — E119 Type 2 diabetes mellitus without complications: Secondary | ICD-10-CM

## 2016-06-21 DIAGNOSIS — R0602 Shortness of breath: Secondary | ICD-10-CM

## 2016-06-21 LAB — POCT GLYCOSYLATED HEMOGLOBIN (HGB A1C): Hemoglobin A1C: 6.2

## 2016-06-21 NOTE — Progress Notes (Signed)
    Subjective: CC:ED follow up HPI: Amber [name] is a 76 y.o. female presenting to clinic today for:  1. SOB She was seen in the ED on 06/16/16 for an episode of SOB at home that lasted about 5 minutes.  She describes episode as a sudden loss of ability to take a deep breath in.  She denies CP, dizziness, diaphoresis, nausea, vomiting at that time.  She reports she has chills/ sweats at baseline.  She is able to walk several blocks without becoming fatigued.  No orthopnea, LE edema.  Patient reports her father died of a heart attack at 27 yo.  She reports that was his first MI.  She reports that each of her children have heart trouble.  One requires a Psychologist, forensic and had a cardiac cath w/ stent placement at 76 years old.  Her other son has ESRD on HD and has had a heart attack.    Social Hx reviewed: non smoker. MedHx, medications and allergies reviewed.  Please see EMR. ROS: Per HPI  Objective: Office vital signs reviewed. BP 124/68   Pulse 61   Temp 97.9 F (36.6 C) (Oral)   Ht 5\' 2"  (1.575 m)   Wt 200 lb 9.6 oz (91 kg)   SpO2 97%   BMI 36.69 kg/m   Physical Examination:  General: Awake, alert, well nourished, No acute distress HEENT: Normal, MMM Neck: no JVD Cardio: regular rate and rhythm, S1S2 heard, no murmurs appreciated Pulm: clear to auscultation bilaterally, no wheezes, rhonchi or rales; normal work of breathing on room air Extremities: warm, well perfused, No edema, cyanosis or clubbing; +2 pulses bilaterally  Assessment/ Plan: 76 y.o. female   1. Shortness of breath.  No specific cause.  No evidence of anxiety/ panic attack.  ED visit/ notes/ studies reviewed.  No evidence of MI or pulmonary issues.  She has a normal exam today.  I think she deserves a cardiology evaluation w/ stress testing given the significant family h/o heart disease.  I reviewed this with patient and she is agreeable to evaluation. - Ambulatory referral to Cardiology  2. Diabetes  mellitus without complication ([place]), controlled. - Continue Metformin. - HgB A1c  Follow up prn or after cardiac eval  Janora Norlander, DO PGY-3, [facility]

## 2016-06-21 NOTE — Patient Instructions (Signed)
I am referring you to a cardiologist for stress testing.  Continue taking your medications as directed daily.  Follow up as needed or after you have had the test done.

## 2016-07-15 ENCOUNTER — Ambulatory Visit: Payer: Medicare Other | Admitting: Physician Assistant

## 2016-07-15 NOTE — Progress Notes (Deleted)
Cardiology Office Note   Date:  07/15/2016   ID:  Amber [name], DOB 05-02-1940, MRN [id number]  PCP:  Janora Norlander, DO  Cardiologist:  Dr Gwenlyn Found, 09/01/2013  Rosaria Ferries, PA-C   No chief complaint on file.   History of Present Illness: Amber [name] is a 76 y.o. female with a history of DM, HTN, HLD, CLL, palpitations (eval by Dr Gwenlyn Found 2015 w/ nl echo and event monitor w/ SR only), FH CAD in her father and 2 children.    06/21/2016, see at [facility] by Dr Lajuana Ripple for episodic SOB>>cards eval recommended.  Amber Sires Waight presents for ***   Past Medical History:  Diagnosis Date  . Anemia   . Arthritis   . Back pain   . Blood transfusion without reported diagnosis   . CLL (chronic lymphocytic leukemia) ([place]) 09/21/2015  . Diabetes mellitus   . Hyperlipidemia   . Hypertension   . Palpitations     Past Surgical History:  Procedure Laterality Date  . ABDOMINAL HYSTERECTOMY    . COLON SURGERY      Current Outpatient Prescriptions  Medication Sig Dispense Refill  . acetaminophen (TYLENOL) 500 MG tablet Take 1 tablet (500 mg total) by mouth every 6 (six) hours as needed. (Patient not taking: Reported on 02/15/2016) 30 tablet 0  . aspirin EC 81 MG tablet Take 81 mg by mouth daily.    Marland Kitchen losartan-hydrochlorothiazide (HYZAAR) 100-12.5 MG tablet TAKE ONE TABLET BY MOUTH ONCE DAILY 90 tablet 4  . metFORMIN (GLUCOPHAGE) 500 MG tablet Take 1 tablet (500 mg total) by mouth 2 (two) times daily with a meal. 180 tablet 4  . metoprolol tartrate (LOPRESSOR) 25 MG tablet Take 1 tablet (25 mg total) by mouth 2 (two) times daily. 90 tablet 0  . Multiple Vitamins-Minerals (WOMENS ONE DAILY) TABS Take 1 tablet by mouth daily.    Vladimir Faster Glycol-Propyl Glycol (SYSTANE OP) Place 2 drops into both eyes 2 (two) times daily.     . pravastatin (PRAVACHOL) 40 MG tablet TAKE ONE TABLET BY MOUTH ONCE DAILY 90 tablet 3  . timolol (BETIMOL) 0.5 % ophthalmic  solution Place 1 drop into the right eye 2 (two) times daily. (Patient taking differently: Place 2 drops into both eyes daily. ) 10 mL 12   No current facility-administered medications for this visit.     Allergies:   Amoxicillin; Codeine; Penicillins; Sulfamethoxazole; and Sulfonamide derivatives    Social History:  The patient  reports that she has never smoked. She quit smokeless tobacco use about 33 years ago. She reports that she does not drink alcohol or use drugs.   Family History:  The patient's family history includes Cancer in her mother; Congestive Heart Failure in her son; Diabetes in her mother; Heart attack (age of onset: 64) in her father; Hypertension in her daughter and son; Pancreatic cancer in her sister.    ROS:  Please see the history of present illness. All other systems are reviewed and negative.    PHYSICAL EXAM: VS:  There were no vitals taken for this visit. , BMI There is no height or weight on file to calculate BMI. GEN: Well nourished, well developed, female in no acute distress  HEENT: normal for age  Neck: no JVD, no carotid bruit, no masses Cardiac: RRR; no murmur, no rubs, or gallops Respiratory:  clear to auscultation bilaterally, normal work of breathing GI: soft, nontender, nondistended, + BS MS: no deformity or atrophy;  no edema; distal pulses are 2+ in all 4 extremities   Skin: warm and dry, no rash Neuro:  Strength and sensation are intact Psych: euthymic mood, full affect   EKG:  EKG {ACTION; IS/IS VOJ:[id number]} ordered today. The ekg ordered today demonstrates ***   Recent Labs: 05/23/2016: TSH 2.780 06/16/2016: BUN 16; Creatinine, Ser 1.01; Hemoglobin 11.9; Platelets 329; Potassium 4.1; Sodium 139    Lipid Panel    Component Value Date/Time   CHOL 164 09/05/2014 0917   TRIG 135 09/05/2014 0917   HDL 48 09/05/2014 0917   CHOLHDL 3.4 09/05/2014 0917   VLDL 27 09/05/2014 0917   LDLCALC 89 09/05/2014 0917   LDLDIRECT 98 05/03/2009  2054     Wt Readings from Last 3 Encounters:  06/21/16 200 lb 9.6 oz (91 kg)  06/16/16 184 lb (83.5 kg)  05/23/16 197 lb 12.8 oz (89.7 kg)     Other studies Reviewed: Additional studies/ records that were reviewed today include: ***.  ASSESSMENT AND PLAN:  1.  ***   Current medicines are reviewed at length with the patient today.  The patient {ACTIONS; HAS/DOES NOT HAVE:19233} concerns regarding medicines.  The following changes have been made:  {PLAN; NO CHANGE:13088:s}  Labs/ tests ordered today include: *** No orders of the defined types were placed in this encounter.    Disposition:   FU with ***  Signed, Rosaria Ferries, PA-C  07/15/2016 8:03 AM    [facility] Phone: [phone]; Fax: [phone]  This note was written with the assistance of speech recognition software. Please excuse any transcriptional errors.

## 2016-07-16 ENCOUNTER — Encounter: Payer: Self-pay | Admitting: Family Medicine

## 2016-07-16 ENCOUNTER — Other Ambulatory Visit: Payer: Self-pay | Admitting: Family Medicine

## 2016-07-16 ENCOUNTER — Ambulatory Visit (INDEPENDENT_AMBULATORY_CARE_PROVIDER_SITE_OTHER): Payer: Medicare Other | Admitting: Family Medicine

## 2016-07-16 VITALS — BP 150/75 | HR 76 | Temp 98.3°F | Ht 62.0 in | Wt 201.0 lb

## 2016-07-16 DIAGNOSIS — I10 Essential (primary) hypertension: Secondary | ICD-10-CM

## 2016-07-16 DIAGNOSIS — M25531 Pain in right wrist: Secondary | ICD-10-CM

## 2016-07-16 MED ORDER — MELOXICAM 7.5 MG PO TABS: 7.5000 mg | ORAL_TABLET | Freq: Every day | ORAL | 0 refills | Status: AC

## 2016-07-16 NOTE — Patient Instructions (Signed)
You have some inflammation in your wrist.  Take the anti-inflammatory for the next couple of weeks.  If it is not improving, please come back in 2 weeks.  Take care,  Dr Jerline Pain

## 2016-07-16 NOTE — Progress Notes (Signed)
    Subjective:  Amber [name] is a 76 y.o. female who presents to the [facility] today with a chief complaint of wrist pain.   HPI:  Right Wrist Pain Pain started about a week ago. Located on the dorsal aspect of her wrist. Sometimes radiates into fingers. Pain is described as more of a throbbing sensation and is constant. She has not noticed any exacerbating or alleviating factors. She occasionally has numbness in her hands. She has tried applying rubbing alcohol to the area which does not help. No fevers. No trauma or other known obvious precipitating events.  ROS: Per HPI  PMH: Smoking history reviewed.   Objective:  Physical Exam: BP (!) 150/75 (BP Location: Left Arm, Patient Position: Sitting, Cuff Size: Normal)   Pulse 76   Temp 98.3 F (36.8 C) (Oral)   Ht 5\' 2"  (1.575 m)   Wt 201 lb (91.2 kg)   SpO2 96%   BMI 36.76 kg/m   Gen: NAD, resting comfortably MSK: - R Wrist: No deformities. Tender to palpation over dorsal and volar wrist. FROM. Strength 5/5 in all directions. Phalen and Tinel's test positive. Finklestein positive. Neurovascularly intact distally.  - L Wrist: No deformities. Nontender to palpation. Strength intact.   Assessment/Plan:  Right Wrist Pain No clear diagnosis. Likely multifactorial. She likely has a component of carpal tunnel syndrome given her positive Phalen and tinel and hand numbness. She also likely has some underlying OA. Will treat with rest and 2 weeks of mobic. Asked patient to wear a cock-up wrist splint at night. Return precautions reviewed. Follow up in 2-3 weeks if worsening or no improving.   Algis Greenhouse. Jerline Pain, [place] Resident PGY-3 07/16/2016 4:20 PM

## 2016-08-01 ENCOUNTER — Telehealth: Payer: Self-pay | Admitting: Family Medicine

## 2016-08-01 NOTE — Telephone Encounter (Signed)
Pt informed of below, stated she would try this and then if it doesn't help she will call and schedule and appointment. Amber [name], Amber [name], [name]

## 2016-08-01 NOTE — Telephone Encounter (Signed)
Pt has headaches on the right side, that come and go. Pt would like PCP to call her and let her know what she can take for them. Pt declined appointment. Pt would like PCP to call her around 1pm or 2pm today. ep

## 2016-08-01 NOTE — Telephone Encounter (Signed)
I am not on campus today.  Please let patient know that she may take Tylenol 650mg  every 8 hours if needed for headache but if she is experiencing severe headache or she has associated symptoms, she should be evaluated.

## 2016-09-03 ENCOUNTER — Other Ambulatory Visit: Payer: Self-pay | Admitting: Family Medicine

## 2016-09-03 DIAGNOSIS — I10 Essential (primary) hypertension: Secondary | ICD-10-CM

## 2016-09-04 ENCOUNTER — Encounter: Payer: Self-pay | Admitting: Cardiovascular Disease

## 2016-09-04 ENCOUNTER — Encounter: Payer: Medicare Other | Admitting: Cardiovascular Disease

## 2016-09-04 ENCOUNTER — Encounter: Payer: Self-pay | Admitting: *Deleted

## 2016-09-04 ENCOUNTER — Other Ambulatory Visit: Payer: Self-pay | Admitting: Family Medicine

## 2016-09-04 DIAGNOSIS — I10 Essential (primary) hypertension: Secondary | ICD-10-CM

## 2016-09-04 NOTE — Telephone Encounter (Signed)
Pt calling to request refill of:  Name of Medication(s):  meoprolol tartrate, pt has been out since Monday  Last date of OV: 07-16-16 Pharmacy:  [facility]  Will route refill request to Clinic RN.  Discussed with patient policy to call pharmacy for future refills.  Also, discussed refills may take up to 48 hours to approve or deny.  Renella Cunas

## 2016-09-16 ENCOUNTER — Encounter: Payer: Self-pay | Admitting: Family Medicine

## 2016-09-16 ENCOUNTER — Ambulatory Visit (INDEPENDENT_AMBULATORY_CARE_PROVIDER_SITE_OTHER): Payer: Medicare Other | Admitting: Family Medicine

## 2016-09-16 VITALS — BP 140/70 | HR 57 | Temp 97.8°F | Ht 62.0 in | Wt 198.6 lb

## 2016-09-16 DIAGNOSIS — M7989 Other specified soft tissue disorders: Secondary | ICD-10-CM

## 2016-09-16 MED ORDER — METFORMIN HCL 500 MG PO TABS: 500.0000 mg | ORAL_TABLET | Freq: Two times a day (BID) | ORAL | 4 refills | Status: DC

## 2016-09-16 NOTE — Assessment & Plan Note (Addendum)
Patient has recent hemoglobin WNL, unlikely to be anemia and denies CP, SOB and has no crackles on exam making this unlikely to be cardiac.  Most likely this is dependent and will improve with leg elevation and compression socks - recommended elevation and compression socks.  - return precautions discussed with patient

## 2016-09-16 NOTE — Patient Instructions (Signed)
Amber [name], you were seen today for swelling of your feet.  Your lungs are clear and you have no chest pain or shortness of breath and do not have anemia.  I would recommend that you elevate your legs when you can and wear your compression stockings.   It was very nice to see you today, Aldeen Riga L. Rosalyn Gess, [facility] Resident PGY-2 09/16/2016 12:08 PM

## 2016-09-16 NOTE — Progress Notes (Signed)
    Subjective:  Amber [name] is a 76 y.o. female who presents to the [facility] today with a chief complaint Of lower shortness swelling  HPI:  Ankle and feet swelling: Patient noticed swelling of her feet and ankles over the past few days worse at the end of the day and improved when she wakes up in the morning. She denies any chest pain, weakness, shortness of breath, swelling of her legs, redness or pain.   Tobacco use reviewed Medication: reviewed and updated ROS: see HPI   Objective:  Physical Exam: BP 140/70 (BP Location: Left Arm, Patient Position: Sitting, Cuff Size: Large)   Pulse (!) 57   Temp 97.8 F (36.6 C) (Oral)   Ht 5\' 2"  (1.575 m)   Wt 198 lb 9.6 oz (90.1 kg)   SpO2 96%   BMI 36.32 kg/m   Gen: 76yo F in NAD, resting comfortably CV: RRR with no murmurs appreciated Pulm: NWOB, CTAB with no crackles, wheezes, or rhonchi GI: Normal bowel sounds present. Soft, Nontender, Nondistended. MSK: no edema, cyanosis, or clubbing noted Extremities: 1+ edema in bilateral feet and ankles Neuro: grossly normal, moves all extremities Psych: Normal affect and thought content  No results found for this or any previous visit (from the past 72 hour(s)).   Assessment/Plan:  Swelling of lower extremity Patient has recent hemoglobin WNL, unlikely to be anemia and denies CP, SOB and has no crackles on exam making this unlikely to be cardiac.  Most likely this is dependent and will improve with leg elevation and compression socks - recommended elevation and compression socks.  - return precautions discussed with patient

## 2016-09-27 ENCOUNTER — Other Ambulatory Visit: Payer: Medicare Other

## 2016-09-29 NOTE — Progress Notes (Signed)
Cardiology Office Note    Date:  09/30/2016   ID:  Amber [name], DOB January 27, 1941, MRN [id number]  PCP:  Eloise Levels, MD  Cardiologist: Dr. Gwenlyn Found   Chief Complaint  Patient presents with  . Follow-up    worsening lower extremity edema    History of Present Illness:    Amber [name] is a 76 y.o. female with past medical history of HTN, HLD, Type 2 DM, palpitations, and normal coronary arteries by catheterization in 2012 who presents to the office today for evaluation of lower extremity edema.   She was last examined by Dr. Gwenlyn Found in 08/2013 and reported intermittent palpitations. An echocardiogram and event monitor were ordered. The echo showed a preserved EF of 60-65% with no WMA, Grade 1 DD, and mild MR. I cannot see where the event monitor was ever placed.  She was evaluated by her PCP in 06/2016 for worsening dyspnea on exertion. She was informed to follow-up with Cardiology but no-showed for her first two scheduled appointments.   In talking with the patient today, she actually denies any recent chest discomfort or dyspnea on exertion. She goes to the mall multiple times per week and walks around for exercise. She denies any anginal symptoms with this. She denies any recent orthopnea, PND, palpitations, lightheadedness, dizziness, presyncope, or lower extremity claudication. She does notice lower extremity edema which is present when she goes for her walks but no pitting edema. This subsides with elevation of her lower extremities.   She does not check her blood pressure regularly but is elevated to 160/76 during today's visit. She reports having not yet taken her morning medications. BP is still elevated at 158/68 on recheck.   Past Medical History:  Diagnosis Date  . Anemia   . Arthritis   . Back pain   . Blood transfusion without reported diagnosis   . CLL (chronic lymphocytic leukemia) ([place]) 09/21/2015  . Diabetes mellitus   . H/O echocardiogram    a.  2015: echo showing a preserved EF of 60-65%, Grade 1 DD and mild MR.  Marland Kitchen History of cardiac cath    a. normal cors by cath in 03/2010  . Hyperlipidemia   . Hypertension   . Palpitations     Past Surgical History:  Procedure Laterality Date  . ABDOMINAL HYSTERECTOMY    . COLON SURGERY      Current Medications: Outpatient Medications Prior to Visit  Medication Sig Dispense Refill  . acetaminophen (TYLENOL) 500 MG tablet Take 1 tablet (500 mg total) by mouth every 6 (six) hours as needed. 30 tablet 0  . aspirin EC 81 MG tablet Take 81 mg by mouth daily.    Marland Kitchen losartan-hydrochlorothiazide (HYZAAR) 100-12.5 MG tablet TAKE ONE TABLET BY MOUTH ONCE DAILY 90 tablet 4  . metFORMIN (GLUCOPHAGE) 500 MG tablet Take 1 tablet (500 mg total) by mouth 2 (two) times daily with a meal. 180 tablet 4  . metoprolol tartrate (LOPRESSOR) 25 MG tablet TAKE 1 TABLET BY MOUTH TWICE DAILY 90 tablet 0  . Multiple Vitamins-Minerals (WOMENS ONE DAILY) TABS Take 1 tablet by mouth daily.    Vladimir Faster Glycol-Propyl Glycol (SYSTANE OP) Place 2 drops into both eyes 2 (two) times daily.     . pravastatin (PRAVACHOL) 40 MG tablet TAKE ONE TABLET BY MOUTH ONCE DAILY 90 tablet 3  . timolol (BETIMOL) 0.5 % ophthalmic solution Place 1 drop into the right eye 2 (two) times daily. (Patient taking differently: Place 2 drops  into both eyes daily. ) 10 mL 12   No facility-administered medications prior to visit.      Allergies:   Amoxicillin; Sulfamethoxazole; Sulfonamide derivatives; Codeine; and Penicillins   Social History   Social History  . Marital status: Widowed    Spouse name: N/A  . Number of children: 5  . Years of education: N/A   Social History Main Topics  . Smoking status: Never Smoker  . Smokeless tobacco: Former Systems developer    Quit date: 02/05/1983  . Alcohol use No  . Drug use: No  . Sexual activity: No   Other Topics Concern  . 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  5 children, lives alone.   Pt exercises by walking every other day.           Family History:  The patient's family history includes Cancer in her mother; Congestive Heart Failure in her son; Diabetes in her mother and son; Heart attack (age of onset: 64) in her father; Hypertension in her daughter and son; Pancreatic cancer in her sister.   Review of Systems:   Please see the history of present illness.     General:  No chills, fever, night sweats or weight changes.  Cardiovascular:  No chest pain, dyspnea on exertion, orthopnea, palpitations, paroxysmal nocturnal dyspnea. Positive for lower extremity edema.  Dermatological: No rash, lesions/masses Respiratory: No cough, dyspnea Urologic: No hematuria, dysuria Abdominal:   No nausea, vomiting, diarrhea, bright red blood per rectum, melena, or hematemesis Neurologic:  No visual changes, wkns, changes in mental status. All other systems reviewed and are otherwise negative except as noted above.   Physical Exam:    VS:  BP (!) 160/76   Pulse (!) 50   Ht 5\' 2"  (1.575 m)   Wt 200 lb (90.7 kg)   BMI 36.58 kg/m    General: Well developed, well nourished [place] American female appearing in no acute distress. Head: Normocephalic, atraumatic, sclera non-icteric, no xanthomas, nares are without discharge.  Neck: No carotid bruits. JVD not elevated.  Lungs: Respirations regular and unlabored, without wheezes or rales.  Heart: Regular rhythm, bradycardiac rate. No S3 or S4.  No murmur, no rubs, or gallops appreciated. Abdomen: Soft, non-tender, non-distended with normoactive bowel sounds. No hepatomegaly. No rebound/guarding. No obvious abdominal masses. Msk:  Strength and tone appear normal for age. No joint deformities or effusions. Extremities: No clubbing or cyanosis. No lower extremity edema.  Distal pedal pulses are 2+ bilaterally. Neuro: Alert and oriented X 3. Moves  all extremities spontaneously. No focal deficits noted. Psych:  Responds to questions appropriately with a normal affect. Skin: No rashes or lesions noted  Wt Readings from Last 3 Encounters:  09/30/16 200 lb (90.7 kg)  09/16/16 198 lb 9.6 oz (90.1 kg)  07/16/16 201 lb (91.2 kg)    Studies/Labs Reviewed:   EKG:  EKG is ordered today.  The ekg ordered today demonstrates sinus bradycardia, HR 50, with no acute ST or T-wave changes when compared to prior tracings.   Recent Labs: 05/23/2016: TSH 2.780 06/16/2016: BUN 16; Creatinine, Ser 1.01; Hemoglobin 11.9; Platelets 329; Potassium 4.1; Sodium 139   Lipid Panel    Component Value Date/Time   CHOL 164 09/05/2014 0917   TRIG 135 09/05/2014 0917   HDL 48 09/05/2014 0917   CHOLHDL 3.4 09/05/2014 0917   VLDL 27 09/05/2014 0917  [place] 89 09/05/2014 0917   LDLDIRECT 98 05/03/2009 2054    Additional studies/ records that were reviewed today include:   Echocardiogram: 09/2013 Study Conclusions  - Left ventricle: The cavity size was normal. Systolic function was normal. The estimated ejection fraction was in the range of 60% to 65%. Wall motion was normal; there were no regional wall motion abnormalities. Doppler parameters are consistent with abnormal left ventricular relaxation (grade 1 diastolic dysfunction). - Mitral valve: There was mild regurgitation. - Left atrium: The atrium was mildly dilated.   Cardiac Catheterization: 03/2010 IMPRESSION: 1. No significant coronary artery disease. 2. Normal left ventricular function. 3. No abdominal aortic aneurysm or renal artery stenosis.  RECOMMENDATIONS:  Continue preventive therapy and treatment for risk factors including high blood pressure and diabetes.  Assessment:    1. Lower extremity edema   2. Heart palpitations   3. Hypertension, unspecified type   4. Mixed hyperlipidemia      Plan:   In order of problems listed above:  1. Lower Extremity  Edema - She reports having lower extremity edema which is only present when she walks for an extended period of time. She denies any associated chest discomfort, dyspnea on exertion, orthopnea, or PND. - On examination, she does not appear volume overloaded. A prior echo in 2015 showed a preserved EF of 60-65% with no WMA, Grade 1 DD, and mild MR. - overall, her symptoms seem most consistent with dependent edema and not consistent with CHF. We reviewed conservative measures including the use of compression stockings and limiting sodium intake. Importance of good BP control also reviewed with the patient. I informed the patient that if she develops associated symptoms or if edema does not improve with conservative measures, could consider a repeat echocardiogram at that time.   2. Palpitations - she does note that her heart "skips beats" at times. No associated lightheadedness, dizziness, or presyncope. Has a known history of PAC's and PVC's. - continue Lopressor 25mg  BID at this time. Would not further titrate her BB at this time with HR in the 50's.   3. HTN - BP is elevated at 160/76, at 158/78 on recheck. - she is currently on Hyzaar 100-12.5mg  daily and Lopressor 25mg  BID. She has not yet taken her morning medications. She plans to purchase a BP cuff and will check this regularly at home. If BP remains elevated, can further titrate Hyzaar to 100-25mg  daily.   4. HLD - followed by her PCP. Remains on Pravastatin 40mg  daily.    Medication Adjustments/Labs and Tests Ordered: Current medicines are reviewed at length with the patient today.  Concerns regarding medicines are outlined above.  Medication changes, Labs and Tests ordered today are listed in the Patient Instructions below. Patient Instructions  Medication Instructions:  Your physician recommends that you continue on your current medications as directed. Please refer to the Current Medication list given to you today.  Follow-Up: Your  physician wants you to follow-up in: 1 YEAR with Dr. Gwenlyn Found. You will receive a reminder letter in the mail two months in advance. If you don't receive a letter, please call our office to schedule the follow-up appointment.   Any Other Special Instructions Will Be Listed Below (If Applicable).  Please check your blood pressure 2-3 times weekly and call in 2-3 weeks to report readings.    If you need a refill on your cardiac medications before your next appointment, please call your pharmacy.   How to Use Compression Stockings Compression stockings  are elastic socks that squeeze the legs. They help to increase blood flow to the legs, decrease swelling in the legs, and reduce the chance of developing blood clots in the lower legs. Compression stockings are often used by people who:  Are recovering from surgery.  Have poor circulation in their legs.  Are prone to getting blood clots in their legs.  Have varicose veins.  Sit or stay in bed for long periods of time.  How to use compression stockings Before you put on your compression stockings:  Make sure that they are the correct size. If you do not know your size, ask your health care provider.  Make sure that they are clean, dry, and in good condition.  Check them for rips and tears. Do not put them on if they are ripped or torn.  Put your stockings on first thing in the morning, before you get out of bed. Keep them on for as long as your health care provider advises. When you are wearing your stockings:  Keep them as smooth as possible. Do not allow them to bunch up. It is especially important to prevent the stockings from bunching up around your toes or behind your knees.  Do not roll the stockings downward and leave them rolled down. This can decrease blood flow to your leg.  Change them right away if they become wet or dirty. 1.   When you take off your stockings, inspect your legs and feet. Anything that does not seem  normal may require medical attention. Look for:  Open sores.  Red spots.  Swelling.  Information and tips  Do not stop wearing your compression stockings without talking to your health care provider first.  Wash your stockings every day with mild detergent in cold or warm water. Do not use bleach. Air-dry your stockings or dry them in a clothes dryer on low heat.  Replace your stockings every 3-6 months.  If skin moisturizing is part of your treatment plan, apply lotion or cream at night so that your skin will be dry when you put on the stockings in the morning. It is harder to put the stockings on when you have lotion on your legs or feet. Contact a health care provider if: Remove your stockings and seek medical care if:  You have a feeling of pins and needles in your feet or legs.  You have any new changes in your skin.  You have skin lesions that are getting worse.  You have swelling or pain that is getting worse.  Get help right away if:  You have numbness or tingling in your lower legs that does not get better right after you take the stockings off.  Your toes or feet become cold and blue.  You develop open sores or red spots on your legs that do not go away.  You see or feel a warm spot on your leg.  You have new swelling or soreness in your leg.  You are short of breath or you have chest pain for no reason.  You have a rapid or irregular heartbeat.  You feel light-headed or dizzy. This information is not intended to replace advice given to you by your health care provider. Make sure you discuss any questions you have with your health care provider. Document Released: 11/18/2008 Document Revised: 06/21/2015 Document Reviewed: 12/29/2013 Elsevier Interactive Patient Education  2018 [place], Erma Heritage, [place]  09/30/2016 1:17 PM    [facility] [place], [address], [place]  [address] Phone: [phone]; Fax: [phone]  [address], [place], [place] [address] Phone: [phone]

## 2016-09-30 ENCOUNTER — Encounter: Payer: Self-pay | Admitting: Student

## 2016-09-30 ENCOUNTER — Ambulatory Visit (INDEPENDENT_AMBULATORY_CARE_PROVIDER_SITE_OTHER): Payer: Medicare Other | Admitting: Student

## 2016-09-30 VITALS — BP 160/76 | HR 50 | Ht 62.0 in | Wt 200.0 lb

## 2016-09-30 DIAGNOSIS — I10 Essential (primary) hypertension: Secondary | ICD-10-CM

## 2016-09-30 DIAGNOSIS — R002 Palpitations: Secondary | ICD-10-CM | POA: Diagnosis not present

## 2016-09-30 DIAGNOSIS — E782 Mixed hyperlipidemia: Secondary | ICD-10-CM

## 2016-09-30 DIAGNOSIS — R600 Localized edema: Secondary | ICD-10-CM

## 2016-09-30 NOTE — Patient Instructions (Signed)
Medication Instructions:  Your physician recommends that you continue on your current medications as directed. Please refer to the Current Medication list given to you today.  Follow-Up: Your physician wants you to follow-up in: 1 YEAR with Dr. Gwenlyn Found. You will receive a reminder letter in the mail two months in advance. If you don't receive a letter, please call our office to schedule the follow-up appointment.   Any Other Special Instructions Will Be Listed Below (If Applicable).  Please check your blood pressure 2-3 times weekly and call in 2-3 weeks to report readings.    If you need a refill on your cardiac medications before your next appointment, please call your pharmacy.   How to Use Compression Stockings Compression stockings are elastic socks that squeeze the legs. They help to increase blood flow to the legs, decrease swelling in the legs, and reduce the chance of developing blood clots in the lower legs. Compression stockings are often used by people who:  Are recovering from surgery.  Have poor circulation in their legs.  Are prone to getting blood clots in their legs.  Have varicose veins.  Sit or stay in bed for long periods of time.  How to use compression stockings Before you put on your compression stockings:  Make sure that they are the correct size. If you do not know your size, ask your health care provider.  Make sure that they are clean, dry, and in good condition.  Check them for rips and tears. Do not put them on if they are ripped or torn.  Put your stockings on first thing in the morning, before you get out of bed. Keep them on for as long as your health care provider advises. When you are wearing your stockings:  Keep them as smooth as possible. Do not allow them to bunch up. It is especially important to prevent the stockings from bunching up around your toes or behind your knees.  Do not roll the stockings downward and leave them rolled down. This  can decrease blood flow to your leg.  Change them right away if they become wet or dirty. 1.   When you take off your stockings, inspect your legs and feet. Anything that does not seem normal may require medical attention. Look for:  Open sores.  Red spots.  Swelling.  Information and tips  Do not stop wearing your compression stockings without talking to your health care provider first.  Wash your stockings every day with mild detergent in cold or warm water. Do not use bleach. Air-dry your stockings or dry them in a clothes dryer on low heat.  Replace your stockings every 3-6 months.  If skin moisturizing is part of your treatment plan, apply lotion or cream at night so that your skin will be dry when you put on the stockings in the morning. It is harder to put the stockings on when you have lotion on your legs or feet. Contact a health care provider if: Remove your stockings and seek medical care if:  You have a feeling of pins and needles in your feet or legs.  You have any new changes in your skin.  You have skin lesions that are getting worse.  You have swelling or pain that is getting worse.  Get help right away if:  You have numbness or tingling in your lower legs that does not get better right after you take the stockings off.  Your toes or feet become cold and blue.  You develop open sores or red spots on your legs that do not go away.  You see or feel a warm spot on your leg.  You have new swelling or soreness in your leg.  You are short of breath or you have chest pain for no reason.  You have a rapid or irregular heartbeat.  You feel light-headed or dizzy. This information is not intended to replace advice given to you by your health care provider. Make sure you discuss any questions you have with your health care provider. Document Released: 11/18/2008 Document Revised: 06/21/2015 Document Reviewed: 12/29/2013 Elsevier Interactive Patient Education   [employer].

## 2016-10-04 ENCOUNTER — Encounter: Payer: Self-pay | Admitting: Hematology and Oncology

## 2016-10-04 ENCOUNTER — Ambulatory Visit: Payer: Medicare Other | Admitting: Hematology and Oncology

## 2016-10-16 ENCOUNTER — Ambulatory Visit (INDEPENDENT_AMBULATORY_CARE_PROVIDER_SITE_OTHER): Payer: Medicare Other | Admitting: Family Medicine

## 2016-10-16 VITALS — BP 144/82 | HR 79 | Temp 97.7°F | Ht 62.0 in | Wt 195.0 lb

## 2016-10-16 DIAGNOSIS — R399 Unspecified symptoms and signs involving the genitourinary system: Secondary | ICD-10-CM

## 2016-10-16 LAB — POCT URINALYSIS DIP (MANUAL ENTRY)
BILIRUBIN UA: NEGATIVE mg/dL
Bilirubin, UA: NEGATIVE
Blood, UA: NEGATIVE
Glucose, UA: NEGATIVE mg/dL
LEUKOCYTES UA: NEGATIVE
Nitrite, UA: NEGATIVE
PROTEIN UA: NEGATIVE mg/dL
Spec Grav, UA: 1.02 (ref 1.010–1.025)
Urobilinogen, UA: 0.2 E.U./dL
pH, UA: 5.5 (ref 5.0–8.0)

## 2016-10-16 MED ORDER — CEPHALEXIN 500 MG PO CAPS: 500.0000 mg | ORAL_CAPSULE | Freq: Two times a day (BID) | ORAL | 0 refills | Status: AC

## 2016-10-16 NOTE — Assessment & Plan Note (Addendum)
Suprapubic pain x5 days with urinary frequency. No hematuria, vaginal discharge/bleeding. U/A neg for leuk esterase, bacteria, however age, history and exam suspicious for UTI. Will treat empirically with Keflex and follow up urine cultures. If symptoms do not resolve and cultures are negative can consider further work up with urinary imaging such as CT abdomen/pelvis or KUB. - Keflex 500mg  BID 7 days - Take the entire course of antibiotics regardless of when you begin to feel better. - Be sure to drink plenty of water to consistently flush your urinary tract.

## 2016-10-16 NOTE — Progress Notes (Signed)
   Subjective:   Patient ID: Amber [name]    DOB: 1940/02/22, 76 y.o. female   MRN: [id number]  Amber [name] is a 76 y.o. female with a history of DM2, HLD, GERD here for sharp suprapubic pain that has been off and on since Saturday.  Patient states she has only been sleeping 3-4 hours per night since then because she has been having to get up and use the bathroom.   She has a partial hysterectomy and still has her ovaries.  She states laying down and walking around make it feel better but sitting up from a laying position exacerbate the pain.  She is on a diuretic but denies any recent medication or dose changes.  She denies burning or pain with urination, hematuria, back pain, fevers, nausea, vomiting, vaginal bleeding or abnormal vaginal discharge but does endorse urinary frequency.     Review of Systems:  Per HPI.   Orestes reviewed. Smoking status reviewed. Medications reviewed.  Objective:   BP (!) 144/82 (BP Location: Left Arm, Patient Position: Sitting, Cuff Size: Large)   Pulse 79   Temp 97.7 F (36.5 C) (Oral)   Ht 5\' 2"  (1.575 m)   Wt 195 lb (88.5 kg)   SpO2 96%   BMI 35.67 kg/m  Vitals and nursing note reviewed.  General: well nourished, well developed, in mild distress with non-toxic appearance CV: regular rate and rhythm without murmurs, rubs, or gallops, no lower extremity edema Lungs: clear to auscultation bilaterally with normal work of breathing Abdomen: soft, non-distended, normoactive bowel sounds, very tender to palpation suprapubically Skin: warm, dry, no rashes or lesions Extremities: warm and well perfused, normal tone MSK: Full ROM, strength intact, gait normal Neuro: Alert and oriented, speech normal  Assessment & Plan:   UTI symptoms Suprapubic pain x5 days with urinary frequency. No hematuria, vaginal discharge/bleeding. U/A neg for leuk esterase, bacteria, however age, history and exam suspicious for UTI. Will treat empirically with Keflex  and follow up urine cultures. If symptoms do not resolve and cultures are negative can consider further work up with urinary imaging such as CT abdomen/pelvis or KUB. - Keflex 500mg  BID 7 days - Take the entire course of antibiotics regardless of when you begin to feel better. - Be sure to drink plenty of water to consistently flush your urinary tract.   Orders Placed This Encounter  Procedures  . Urine Culture  . POCT urinalysis dipstick   Meds ordered this encounter  Medications  . cephALEXin (KEFLEX) 500 MG capsule    Sig: Take 1 capsule (500 mg total) by mouth 2 (two) times daily.    Dispense:  14 capsule    Refill:  0    Rory Percy, DO PGY-1, [facility] 10/16/2016 3:28 PM

## 2016-10-16 NOTE — Patient Instructions (Addendum)
It was great to see you!  Your abdominal pain is caused by a Urinary tract infection.   - We are prescribing antibiotics to treat this infection.    - Keflex (Cephalexin) 500mg  twice daily for 7 days. - Take the entire course of antibiotics regardless of when you begin to feel better. - Be sure to drink plenty of water to consistently flush your urinary tract.   Take care and seek immediate care sooner if you develop any concerns.   Be well, Dr. [facility]   Urinary Tract Infection, Adult A urinary tract infection (UTI) is an infection of any part of the urinary tract. The urinary tract includes the:  Kidneys.  Ureters.  Bladder.  Urethra.  These organs make, store, and get rid of pee (urine) in the body. Follow these instructions at home:  Take over-the-counter and prescription medicines only as told by your doctor.  If you were prescribed an antibiotic medicine, take it as told by your doctor. Do not stop taking the antibiotic even if you start to feel better.  Avoid the following drinks: ? Alcohol. ? Caffeine. ? Tea. ? Carbonated drinks.  Drink enough fluid to keep your pee clear or pale yellow.  Keep all follow-up visits as told by your doctor. This is important.  Make sure to: ? Empty your bladder often and completely. Do not to hold pee for long periods of time. ? Empty your bladder before and after sex. ? Wipe from front to back after a bowel movement if you are female. Use each tissue one time when you wipe. Contact a doctor if:  You have back pain.  You have a fever.  You feel sick to your stomach (nauseous).  You throw up (vomit).  Your symptoms do not get better after 3 days.  Your symptoms go away and then come back. Get help right away if:  You have very bad back pain.  You have very bad lower belly (abdominal) pain.  You are throwing up and cannot keep down any medicines or water. This information is not intended to  replace advice given to you by your health care provider. Make sure you discuss any questions you have with your health care provider. Document Released: 07/10/2007 Document Revised: 06/29/2015 Document Reviewed: 12/12/2014 Elsevier Interactive Patient Education  [employer].

## 2016-10-18 LAB — URINE CULTURE

## 2016-10-25 ENCOUNTER — Other Ambulatory Visit: Payer: Self-pay | Admitting: Family Medicine

## 2016-10-25 DIAGNOSIS — I10 Essential (primary) hypertension: Secondary | ICD-10-CM

## 2016-10-30 ENCOUNTER — Emergency Department (HOSPITAL_COMMUNITY)
Admission: EM | Admit: 2016-10-30 | Discharge: 2016-10-30 | Discharge status: Against Medical Advice | Payer: Medicare Other

## 2016-10-30 ENCOUNTER — Ambulatory Visit (HOSPITAL_COMMUNITY)
Admission: EM | Admit: 2016-10-30 | Discharge: 2016-10-30 | Disposition: A | Discharge status: Home / Self Care | Payer: Medicare Other

## 2016-10-30 ENCOUNTER — Encounter (HOSPITAL_COMMUNITY): Payer: Self-pay | Admitting: *Deleted

## 2016-10-30 DIAGNOSIS — R29810 Facial weakness: Secondary | ICD-10-CM | POA: Diagnosis not present

## 2016-10-30 NOTE — ED Provider Notes (Signed)
[place]    CSN: [id number] Arrival date & time: 10/30/16  1412  History   Chief Complaint Chief Complaint  Patient presents with  . Oral Swelling    HPI Amber [name] is a 76 y.o. female.   HPI   Patient presenting with mouth drooping.   Began about 5 hours ago. Patient noticed that her smile didn't look normal, so called her sister. Sister agrees that patient's smile does look abnormal. Patient reports tingling in her lips, and says when she woke up this morning she noted a knot on her upper lip. Denies any additional symptoms. Patient denied slurred speech, and her sister agrees that her speech sounds normal. Patient denies confusion, difficulty with word finding, numbness, weakness, or any other symptoms. No history of stroke, but does have Type II DM and HTN, and takes daily ASA.   Past Medical History:  Diagnosis Date  . Anemia   . Arthritis   . Back pain   . Blood transfusion without reported diagnosis   . CLL (chronic lymphocytic leukemia) ([place]) 09/21/2015  . Diabetes mellitus   . H/O echocardiogram    a. 2015: echo showing a preserved EF of 60-65%, Grade 1 DD and mild MR.  Marland Kitchen History of cardiac cath    a. normal cors by cath in 03/2010  . Hyperlipidemia   . Hypertension   . Palpitations     Patient Active Problem List   Diagnosis Date Noted  . UTI symptoms 10/16/2016  . Lower extremity edema 09/30/2016  . Swelling of lower extremity 09/16/2016  . Right hip pain 03/19/2016  . Microcytic anemia 10/05/2015  . Meralgia paraesthetica 10/04/2015  . Atypical lymphocytosis 09/21/2015  . Left arm weakness 08/18/2015  . Dizziness 12/27/2014  . Chronic night sweats 10/18/2014  . Greater trochanteric bursitis of right hip 07/06/2013  . Preventive measure 04/12/2013  . Heart palpitations 05/01/2012  . Chronic sinusitis 09/02/2011  . Allergic rhinitis 06/30/2007  . GERD 06/05/2006  . Diabetes mellitus without complication ([place]) 75/11/2583  .  Hyperlipidemia associated with type 2 diabetes mellitus ([place]) 04/03/2006  . OBESITY, NOS 04/03/2006  . Iron deficiency anemia 04/03/2006  . HYPERTENSION, BENIGN SYSTEMIC 04/03/2006  . MITRAL VALVE PROLAPSE 04/03/2006  . OSTEOARTHRITIS, MULTI SITES 04/03/2006  . Proteinuria 04/03/2006    Past Surgical History:  Procedure Laterality Date  . ABDOMINAL HYSTERECTOMY    . COLON SURGERY      OB History    No data available       Home Medications    Prior to Admission medications   Medication Sig Start Date End Date Taking? Authorizing Provider  acetaminophen (TYLENOL) 500 MG tablet Take 1 tablet (500 mg total) by mouth every 6 (six) hours as needed. 03/18/15   Antonietta Breach, PA-C  aspirin EC 81 MG tablet Take 81 mg by mouth daily.    [provider]  losartan-hydrochlorothiazide (HYZAAR) 100-12.5 MG tablet TAKE ONE TABLET BY MOUTH ONCE DAILY 10/16/15   Ronnie Doss M, DO  metFORMIN (GLUCOPHAGE) 500 MG tablet Take 1 tablet (500 mg total) by mouth 2 (two) times daily with a meal. 09/16/16   Eloise Levels, MD  metoprolol tartrate (LOPRESSOR) 25 MG tablet TAKE 1 TABLET BY MOUTH TWICE DAILY 10/25/16   Eloise Levels, MD  Multiple Vitamins-Minerals (WOMENS ONE DAILY) TABS Take 1 tablet by mouth daily.    [provider]  Polyethyl Glycol-Propyl Glycol (SYSTANE OP) Place 2 drops into both eyes 2 (two) times daily.  [provider]  pravastatin (PRAVACHOL) 40 MG tablet TAKE ONE TABLET BY MOUTH ONCE DAILY 09/29/15   Ronnie Doss M, DO  timolol (BETIMOL) 0.5 % ophthalmic solution Place 1 drop into the right eye 2 (two) times daily. Patient taking differently: Place 2 drops into both eyes daily.  04/12/13   Gerda Diss, DO    Family History Family History  Problem Relation Age of Onset  . Pancreatic cancer Sister        3 sisters died of it  . Heart attack Father 75       Massive MI  . Diabetes Mother   . Cancer Mother   . Hypertension Son   .  Congestive Heart Failure Son   . Diabetes Son   . Hypertension Daughter     Social History Social History  Substance Use Topics  . Smoking status: Never Smoker  . Smokeless tobacco: Former Systems developer    Quit date: 02/05/1983  . Alcohol use No     Allergies   Amoxicillin; Sulfamethoxazole; Sulfonamide derivatives; Codeine; and Penicillins   Review of Systems Review of Systems  Neurological: Positive for facial asymmetry (Drooping mouth). Negative for dizziness, speech difficulty, weakness and numbness.  Psychiatric/Behavioral: Negative for confusion.   Physical Exam Triage Vital Signs ED Triage Vitals [10/30/16 1518]  Enc Vitals Group     BP (!) 168/68     Pulse Rate 78     Resp 18     Temp 98.6 F (37 C)     Temp Source Oral     SpO2 99 %     Weight      Height      Head Circumference      Peak Flow      Pain Score      Pain Loc      Pain Edu?      Excl. in [place]?    No data found.   Updated Vital Signs BP (!) 168/68 (BP Location: Right Arm)   Pulse 78   Temp 98.6 F (37 C) (Oral)   Resp 18   SpO2 99%   Physical Exam  Constitutional: She is oriented to person, place, and time. She appears well-developed and well-nourished. No distress.  HENT:  Head: Normocephalic and atraumatic.  Nose: Nose normal.  Mouth/Throat: Oropharynx is clear and moist. No oropharyngeal exudate.  Small (~0.5cmx0.5cm) firm immobile nodule located on inner surface of upper lip, non-tender to palpation. No other oral lesions. Poor dentition.   Eyes: Pupils are equal, round, and reactive to light. Conjunctivae and EOM are normal. Right eye exhibits no discharge. Left eye exhibits no discharge.  Neck: Normal range of motion. Neck supple.  Cardiovascular: Normal rate, regular rhythm and normal heart sounds.   No murmur heard. Pulmonary/Chest: Effort normal and breath sounds normal. No respiratory distress. She has no wheezes.  Abdominal: Soft. Bowel sounds are normal. She exhibits no  distension. There is no tenderness.  Musculoskeletal: Normal range of motion.  Neurological: She is alert and oriented to person, place, and time.  Mild drooping of R side of mouth, however speech clear. Sensation to light touch equal bilaterally on face. No issues wrinkling forehead, closing eyes, or any other focal CN deficits. 5/5 strength upper and lower extremities bilaterally. Full grip strength bilaterally. Able to walk, sit, and stand with cane but no additional assistance.   Skin: Skin is warm and dry.  Psychiatric: She has a normal mood and affect. Her behavior is normal.  UC Treatments / Results  Labs (all labs ordered are listed, but only abnormal results are displayed) Labs Reviewed - No data to display  EKG  EKG Interpretation None       Radiology No results found.  Procedures Procedures (including critical care time)  Medications Ordered in UC Medications - No data to display   Initial Impression / Assessment and Plan / UC Course  I have reviewed the triage vital signs and the nursing notes.  Pertinent labs & imaging results that were available during my care of the patient were reviewed by me and considered in my medical decision making (see chart for details).     Patient presenting with drooping mouth x5hrs. CVA included in differential, however as patient has no other neurological deficits at all, feel that facial palsy is more likely diagnosis. CN II-XII intact, speech normal, full extremity strength. Denies any numbness. Endorsing lip tingling but no other abnormal sensations. Denies any confusion or difficulty with word finding. Does have risk factors of HTN and diabetes. Is already taking aspirin daily. Discussed with patient and her sister that this seems less likely to be a stroke, but cannot say definitively without imaging whether it is or not. Discussed options of going home and monitoring symptoms vs going to the ED for imaging. Patient wishing to  go to ED to rule out stroke. Patient and her sister left with plans to immediately go to ED.   Final Clinical Impressions(s) / UC Diagnoses   Final diagnoses:  None    New Prescriptions New Prescriptions   No medications on file    Adin Hector, MD, MPH PGY-3     Verner Mould, MD 10/30/16 [phone]

## 2016-10-30 NOTE — ED Triage Notes (Signed)
Pt  Noticed   Some    Swelling  To   Lips  And  Inside  Of  Mouth   Started today      Mouth      Seems   Numb           Pt  Is   Awake  And  Alert         Unable   To   Smile  Normally     Mouth   Seems  To   Be   Slightly  Asymmetrical     Hand  Grips   Are   Strong          Pt  Alert    And   Oriented

## 2016-10-30 NOTE — ED Notes (Signed)
Negative VAN at [facility]

## 2016-11-07 DIAGNOSIS — H01001 Unspecified blepharitis right upper eyelid: Secondary | ICD-10-CM | POA: Diagnosis not present

## 2016-11-07 DIAGNOSIS — H01004 Unspecified blepharitis left upper eyelid: Secondary | ICD-10-CM | POA: Diagnosis not present

## 2016-12-07 NOTE — Progress Notes (Signed)
error    This encounter was created in error - please disregard.

## 2016-12-17 ENCOUNTER — Other Ambulatory Visit: Payer: Self-pay | Admitting: Family Medicine

## 2016-12-17 DIAGNOSIS — I10 Essential (primary) hypertension: Secondary | ICD-10-CM

## 2017-01-02 ENCOUNTER — Ambulatory Visit (INDEPENDENT_AMBULATORY_CARE_PROVIDER_SITE_OTHER): Payer: Medicare Other | Admitting: Family Medicine

## 2017-01-02 ENCOUNTER — Other Ambulatory Visit: Payer: Self-pay

## 2017-01-02 ENCOUNTER — Encounter: Payer: Self-pay | Admitting: Family Medicine

## 2017-01-02 VITALS — BP 158/78 | HR 63 | Temp 98.5°F | Ht 62.0 in | Wt 195.8 lb

## 2017-01-02 DIAGNOSIS — E119 Type 2 diabetes mellitus without complications: Secondary | ICD-10-CM | POA: Diagnosis not present

## 2017-01-02 DIAGNOSIS — R42 Dizziness and giddiness: Secondary | ICD-10-CM

## 2017-01-02 DIAGNOSIS — I10 Essential (primary) hypertension: Secondary | ICD-10-CM | POA: Diagnosis not present

## 2017-01-02 LAB — POCT GLYCOSYLATED HEMOGLOBIN (HGB A1C): HEMOGLOBIN A1C: 6.3

## 2017-01-02 MED ORDER — METOPROLOL TARTRATE 25 MG PO TABS: 12.5000 mg | ORAL_TABLET | Freq: Two times a day (BID) | ORAL | 0 refills | Status: DC

## 2017-01-02 NOTE — Progress Notes (Signed)
   CC: same day dizziness  HPI  Dizziness: x 4 days, feels like she is going to "fall on her face." Also feels like the room is spinning. Mild associated nausea. Hx of same - resolved after changing BP meds. Congestion and rhinorrhea x 2 weeks, been trying over the counter Allegra. No other medication changes. Dizziness may be slightly worse when she stands up from bed. No fevers, no cough, no headache or sinus pain. Son with URI recently as well.   BP - taking metoprolol BID, hyzaar QD took both this am   ROS: denies CP, SOB, abd pain, dysuria, changes in BMs.    CC, SH/smoking status, and VS noted  Objective: BP (!) 158/78   Pulse 63   Temp 98.5 F (36.9 C) (Oral)   Ht 5\' 2"  (1.575 m)   Wt 195 lb 12.8 oz (88.8 kg)   SpO2 96%   BMI 35.81 kg/m  Gen: NAD, alert, cooperative, and pleasant. HEENT: NCAT, EOMI, PERRL. TMs clear bilaterally. No sinus pain to palpation of frontal and maxillary sinuses. No LAD.  CV: RRR, no murmur Resp: CTAB, no wheezes, non-labored Abd: SNTND, BS present, no guarding or organomegaly Ext: No edema, warm Neuro: Alert and oriented, Speech clear, No gross deficits  Assessment and plan:  Dizziness Patient is on metoprolol 25mg  BID for hx of palpitations, which she reports are still intermittment. She has had very similar presentations of dizziness in the past, which resolved with decreasing her metoprolol. Suspect that her dizziness is secondary to bradycardia. Possibly worsened by ?BPPV after recent URI. Neuro exam WNL. TMs clear. If persistent with decrease in metoprolol, would consider asking cardiology about event monitor (this was mentioned in the past after initial palpitation workup). Patient with steady gait on my exam. On chart review, hx of anemia. Patient denies any dark stools or bleeding, but could consider repeat CBC at next visit if dizziness persists.   HYPERTENSION, BENIGN SYSTEMIC BP elevated today, even on recheck. Anticipate that she may  need additional BP agents with decrease in metoprolol. Could consider norvasc, but did not want to add another BP med today for fear of exacerbating dizziness. Asked to RTC in 1 week to see PCP and adjust BP regimen and recheck dizziness.    Orders Placed This Encounter  Procedures  . POCT glycosylated hemoglobin (Hb A1C)    Meds ordered this encounter  Medications  . metoprolol tartrate (LOPRESSOR) 25 MG tablet    Sig: Take 0.5 tablets (12.5 mg total) by mouth 2 (two) times daily.    Dispense:  90 tablet    Refill:  0    Please consider 90 day supplies to promote better adherence    Health Maintenance reviewed - patient wanted flu shot, but accidentally left prior to administration. Please ask at follow up visit.  Ralene Ok, MD, PGY2 01/02/2017 3:33 PM

## 2017-01-02 NOTE — Assessment & Plan Note (Addendum)
Patient is on metoprolol 25mg  BID for hx of palpitations, which she reports are still intermittment. She has had very similar presentations of dizziness in the past, which resolved with decreasing her metoprolol. Suspect that her dizziness is secondary to bradycardia. Possibly worsened by ?BPPV after recent URI. Neuro exam WNL. TMs clear. If persistent with decrease in metoprolol, would consider asking cardiology about event monitor (this was mentioned in the past after initial palpitation workup). Patient with steady gait on my exam. On chart review, hx of anemia. Patient denies any dark stools or bleeding, but could consider repeat CBC at next visit if dizziness persists.

## 2017-01-02 NOTE — Patient Instructions (Signed)
It was a pleasure to see you today! Thank you for choosing [facility] for your primary care. Amber [name] was seen for dizziness.   Our plans for today were:  Take HALF of one of your metoprolol pills in the morning and half at night. This should help your dizziness.   Check your blood pressure at home once per day (at least 1 hour after taking your medicine) or at the grocery store/pharmacy and keep a list of this on a paper.   Come back and see Dr. Rosalyn Gess in 1-2 weeks to see how your blood pressure is doing. We may need to add another medicine.   You should return to our clinic to see Dr. Rosalyn Gess in 1-2 weeks for blood pressure.   Best,  Dr. Lindell Noe

## 2017-01-02 NOTE — Assessment & Plan Note (Signed)
BP elevated today, even on recheck. Anticipate that she may need additional BP agents with decrease in metoprolol. Could consider norvasc, but did not want to add another BP med today for fear of exacerbating dizziness. Asked to RTC in 1 week to see PCP and adjust BP regimen and recheck dizziness.

## 2017-01-03 ENCOUNTER — Other Ambulatory Visit: Payer: Self-pay | Admitting: Family Medicine

## 2017-01-10 ENCOUNTER — Ambulatory Visit (INDEPENDENT_AMBULATORY_CARE_PROVIDER_SITE_OTHER): Payer: Medicare Other | Admitting: Family Medicine

## 2017-01-10 ENCOUNTER — Other Ambulatory Visit: Payer: Self-pay

## 2017-01-10 ENCOUNTER — Encounter: Payer: Self-pay | Admitting: Family Medicine

## 2017-01-10 VITALS — BP 142/64 | HR 61 | Temp 98.0°F | Ht 62.0 in | Wt 196.2 lb

## 2017-01-10 DIAGNOSIS — Z23 Encounter for immunization: Secondary | ICD-10-CM

## 2017-01-10 DIAGNOSIS — I10 Essential (primary) hypertension: Secondary | ICD-10-CM

## 2017-01-10 DIAGNOSIS — R42 Dizziness and giddiness: Secondary | ICD-10-CM

## 2017-01-10 MED ORDER — PRAVASTATIN SODIUM 40 MG PO TABS: 40.0000 mg | ORAL_TABLET | Freq: Every day | ORAL | 3 refills | Status: DC

## 2017-01-10 MED ORDER — LOSARTAN POTASSIUM-HCTZ 100-12.5 MG PO TABS: 1.0000 | ORAL_TABLET | Freq: Every day | ORAL | 4 refills | Status: DC

## 2017-01-10 NOTE — Assessment & Plan Note (Signed)
Symptoms have since resolved after decreasing Lopressor 25 mg twice daily to 12.5 mg twice daily on 01/02/2017.  Symptoms are most likely secondary to bradycardia. -Continue Lopressor 12.5 mg twice daily; patient will follow-up for nursing visit in 2 weeks for blood pressure check.

## 2017-01-10 NOTE — Assessment & Plan Note (Signed)
Blood pressure goal less than 140/90 blood pressure today 142/64.  Recently decreased her Lopressor 25 mg twice daily to 2.5 mg twice daily on 01/02/2017.  Patient denies any chest pain, shortness of breath, headaches or changes in vision.  -Continue Lopressor 12.5 mg twice daily -Continue Hyzaar 100-12.5 mg daily -Follow-up in 2 weeks for nursing visit to have blood pressure checked, will make changes as appropriate

## 2017-01-10 NOTE — Patient Instructions (Signed)
Amber [name], you were seen today for follow-up for dizziness and elevated blood pressure.  After decreasing your metoprolol I am glad to hear that your dizziness has resolved.  Also your blood pressure looks good today.  I would like you to come back in 2 weeks for a nursing visit to have your blood pressure checked.   I am glad you got your flu shot today.  It was very nice to see you.  Daniel L. Rosalyn Gess, [place] [facility] Resident PGY-2 01/10/2017 8:53 AM

## 2017-01-10 NOTE — Progress Notes (Signed)
    Subjective:  Amber [name] is a 76 y.o. female who presents to the [facility] today for follow-up for dizziness and high blood pressure  HPI:  Amber [name] is a 76 year old female who was seen recently for report of dizziness and was noted to have elevated blood pressure with systolic blood pressures greater than 150.  Her blood pressure goal is less than 140/90.  At that time she was taking Lopressor 25 mg twice daily and Hyzaar 100-12.5mg  daily.  She was recommended to decrease her Lopressor to 12.5 mg twice daily.  After doing so she reports no further dizziness.  She also denies any chest pain, shortness of breath, nausea, vomiting or diarrhea.   PMH: Hypertension, type 2 diabetes Tobacco use: Former smoker Medication: reviewed and updated ROS: see HPI   Objective:  Physical Exam: BP (!) 142/64   Pulse 61   Temp 98 F (36.7 C) (Oral)   Ht 5\' 2"  (1.575 m)   Wt 196 lb 3.2 oz (89 kg)   SpO2 97%   BMI 35.89 kg/m   Gen: 76 year old female in NAD, resting comfortably CV: RRR with no murmurs appreciated Pulm: NWOB, CTAB with no crackles, wheezes, or rhonchi GI: Normal bowel sounds present. Soft, Nontender, Nondistended. MSK: no edema, cyanosis, or clubbing noted Skin: warm, dry Neuro: grossly normal, moves all extremities Psych: Normal affect and thought content  Diabetic Foot Exam - Simple   Simple Foot Form Diabetic Foot exam was performed with the following findings:  Yes 01/10/2017  8:57 AM  Visual Inspection No deformities, no ulcerations, no other skin breakdown bilaterally:  Yes Sensation Testing Intact to touch and monofilament testing bilaterally:  Yes Pulse Check Posterior Tibialis and Dorsalis pulse intact bilaterally:  Yes Comments     No results found for this or any previous visit (from the past 72 hour(s)).   Assessment/Plan:  Dizziness Symptoms have since resolved after decreasing Lopressor 25 mg twice daily to 12.5 mg twice daily on 01/02/2017.   Symptoms are most likely secondary to bradycardia. -Continue Lopressor 12.5 mg twice daily; patient will follow-up for nursing visit in 2 weeks for blood pressure check.  HYPERTENSION, BENIGN SYSTEMIC Blood pressure goal less than 140/90 blood pressure today 142/64.  Recently decreased her Lopressor 25 mg twice daily to 2.5 mg twice daily on 01/02/2017.  Patient denies any chest pain, shortness of breath, headaches or changes in vision.  -Continue Lopressor 12.5 mg twice daily -Continue Hyzaar 100-12.5 mg daily -Follow-up in 2 weeks for nursing visit to have blood pressure checked, will make changes as appropriate  Amber [name] L. Rosalyn Gess, [place] [facility] Resident PGY-2 01/10/2017 9:01 AM

## 2017-01-19 ENCOUNTER — Telehealth: Payer: Self-pay | Admitting: Family Medicine

## 2017-01-19 NOTE — Telephone Encounter (Signed)
**  After Hours/ Emergency Line Call*  Received a call to report that Amber [name] checked her blood sugar and it was 203. She does not know what to do. States she ate a piece of peppermint candy earlier. Feels well. Denying missing medications.  Recommended that she take her metformin tonight as scheduled.  Red flags discussed.  Will forward to PCP.  Steve Rattler, DO PGY-2, [facility]

## 2017-01-24 ENCOUNTER — Ambulatory Visit: Payer: Medicare Other | Admitting: *Deleted

## 2017-01-24 VITALS — BP 136/78 | Wt 197.6 lb

## 2017-01-24 DIAGNOSIS — Z0130 Encounter for examination of blood pressure without abnormal findings: Secondary | ICD-10-CM

## 2017-03-05 ENCOUNTER — Other Ambulatory Visit: Payer: Self-pay | Admitting: Family Medicine

## 2017-03-05 DIAGNOSIS — I10 Essential (primary) hypertension: Secondary | ICD-10-CM

## 2017-03-21 ENCOUNTER — Encounter: Payer: Self-pay | Admitting: Family Medicine

## 2017-03-21 ENCOUNTER — Ambulatory Visit (INDEPENDENT_AMBULATORY_CARE_PROVIDER_SITE_OTHER): Payer: Medicare Other | Admitting: Family Medicine

## 2017-03-21 ENCOUNTER — Other Ambulatory Visit: Payer: Self-pay

## 2017-03-21 VITALS — BP 158/70 | HR 59 | Temp 98.1°F | Wt 200.0 lb

## 2017-03-21 DIAGNOSIS — R42 Dizziness and giddiness: Secondary | ICD-10-CM | POA: Diagnosis not present

## 2017-03-21 MED ORDER — DICLOFENAC SODIUM 1 % TD GEL: 2.0000 g | Freq: Four times a day (QID) | TRANSDERMAL | 3 refills | Status: DC

## 2017-03-21 NOTE — Patient Instructions (Addendum)
[name], you were seen today for dizziness. I believe this is due to your low heart rate from your metoprolol.  I want you to stop taking this medication.  Please come back in 2 weeks for blood pressure check.   If you continue to have dizziness come back sooner.   Take care!  Brave Dack L. Rosalyn Gess, [place] Resident PGY-2 03/21/2017 11:43 AM

## 2017-03-21 NOTE — Progress Notes (Signed)
    Subjective:  Amber [name] is a 77 y.o. female who presents to the [facility] today for follow-up for dizziness   HPI:  Dizziness: Amber [name] is a 77 year old female who was last seen by myself in December 2018.  She is previously been evaluated for dizziness and elevated blood pressure was recommended to decrease Lopressor 25 twice daily to 12.5 twice daily and symptoms had resolved.  She also denies any chest pain, shortness of breath, nausea, vomiting or diarrhea.  She denies any recent upper respiratory tract infections and denies feelings of the room spinning.  She had orthostatic vital signs were normal.  She denies any shortness of breath at rest or with exertion.  Denies any blood in her stool or vomit.  Does have a history of anemia, which has been stable for the last 3 readings over 2017 and 18.   PMH: Hypertension, type 2 diabetes Tobacco use: Former smoker Medication: reviewed and updated ROS: see HPI   Objective:  Physical Exam: BP (!) 158/70   Pulse (!) 59   Temp 98.1 F (36.7 C) (Oral)   Wt 200 lb (90.7 kg)   SpO2 97%   BMI 36.58 kg/m   Gen: 77 year old female in NAD, resting comfortably CV: RRR with no murmurs appreciated Pulm: NWOB, CTAB with no crackles, wheezes, or rhonchi GI: Normal bowel sounds present. Soft, Nontender, Nondistended. MSK: no edema, cyanosis, or clubbing noted Skin: warm, dry Neuro: grossly normal, moves all extremities Psych: Normal affect and thought content  Diabetic Foot Exam - Simple   No data filed      No results found for this or any previous visit (from the past 72 hour(s)). \  Assessment/Plan:  Dizziness Previously patient was recommended to decrease Lopressor 25 mg twice daily to 12.5 mg twice daily for dizziness and has had resolved symptoms.  She is presenting again with heart rate to 59, reports of dizziness without orthostatics, reports of bleeding, reports of room spinning and appears steady on her feet.  Blood  pressure goal 140/90.  With blood pressure today 150/70.  Without chest pain, shortness of breath, headache or changes in vision.  Will discontinue Lopressor and have patient follow-up in nursing clinic for repeat blood pressure and will assess for symptoms.  Strict return precautions discussed.    Kitai Purdom L. Rosalyn Gess, [place] Resident PGY-2 03/25/2017 1:16 PM

## 2017-03-24 ENCOUNTER — Telehealth: Payer: Self-pay

## 2017-03-24 NOTE — Telephone Encounter (Signed)
Received fax from [place] requesting prior authorization of Voltaren gel. PA started in CoverMyMeds and status is pending. Will check back in 24 hours. Danley Danker, RN [facility] RN)

## 2017-03-25 ENCOUNTER — Encounter: Payer: Self-pay | Admitting: Family Medicine

## 2017-03-25 NOTE — Telephone Encounter (Signed)
Prior approval for Diclofenac gel completed via CoverMyMeds. Med approved for 01/21/18 - 02/03/18.  [place] informed. Danley Danker, RN [facility] RN)

## 2017-04-29 ENCOUNTER — Other Ambulatory Visit: Payer: Self-pay

## 2017-04-29 ENCOUNTER — Ambulatory Visit: Payer: Medicare Other | Admitting: Family Medicine

## 2017-04-29 ENCOUNTER — Ambulatory Visit (INDEPENDENT_AMBULATORY_CARE_PROVIDER_SITE_OTHER): Payer: Medicare Other | Admitting: Family Medicine

## 2017-04-29 ENCOUNTER — Encounter: Payer: Self-pay | Admitting: Family Medicine

## 2017-04-29 VITALS — BP 130/75 | HR 64 | Temp 97.9°F | Ht 62.0 in | Wt 203.0 lb

## 2017-04-29 DIAGNOSIS — I10 Essential (primary) hypertension: Secondary | ICD-10-CM | POA: Diagnosis not present

## 2017-04-29 DIAGNOSIS — E119 Type 2 diabetes mellitus without complications: Secondary | ICD-10-CM | POA: Diagnosis not present

## 2017-04-29 LAB — POCT GLYCOSYLATED HEMOGLOBIN (HGB A1C): HEMOGLOBIN A1C: 6.4

## 2017-04-29 NOTE — Patient Instructions (Signed)
It was a pleasure to see you today! Thank you for choosing [facility] for your primary care. Amber [name] was seen for regular check up on her blood pressure. Come back to the clinic if you have any new concerns, and go to the emergency room if you have life threatening symptoms.  Today we talked about how great you are doing!  Your blood pressure is good and your A1C is only 6.4.  Keep up the good work.    If we did any lab work today, and the results require attention, either me or my nurse will get in touch with you. If everything is normal, you will get a letter in mail and a message via . If you don't hear from [name] in two weeks, please give [name] a call. Otherwise, we look forward to seeing you again at your next visit. If you have any questions or concerns before then, please call the clinic at [phone].  Please bring all your medications to every doctors visit  Sign up for My Chart to have easy access to your labs results, and communication with your Primary care physician.    Please check-out at the front desk before leaving the clinic.    Best,  Dr. Sherene Sires FAMILY MEDICINE RESIDENT - PGY1 04/29/2017 2:53 PM

## 2017-05-01 ENCOUNTER — Encounter: Payer: Self-pay | Admitting: Family Medicine

## 2017-05-01 NOTE — Progress Notes (Signed)
    Subjective:  Amber [name] is a 77 y.o. female who presents to the [facility] today with a chief complaint of followup on high blood pressure and DM.   Appt had said otherwise but she is here to discuss her BP and DM since making diet improvements.  She requested an A1C (6.4 from last 6.3) and now has imporved BP control which is now normotensive.  She did this primarily by by altering diet and hasn't been relying on exercise.   She thinks the changes of reducing processed meats and adding vegetables are going to be sustainable for the long term.  She is happy with her progress and glad about her blood pressure lowering.   Objective:  Physical Exam: BP 130/75 (BP Location: Left Arm, Patient Position: Sitting, Cuff Size: Normal)   Pulse 64   Temp 97.9 F (36.6 C) (Oral)   Ht 5\' 2"  (1.575 m)   Wt 203 lb (92.1 kg)   SpO2 96%   BMI 37.13 kg/m   Gen: NAD, conversing comfortably, slightly overweight CV: RRR with no murmurs appreciated Pulm: NWOB, CTAB with no crackles, wheezes, or rhonchi GI: Normal bowel sounds present. Soft, Nontender, Nondistended. MSK: no edema, cyanosis, or clubbing noted Skin: warm, dry Neuro: grossly normal, moves all extremities Psych: Normal affect and thought content  Results for orders placed or performed in visit on 04/29/17 (from the past 72 hour(s))  HgB A1c     Status: None   Collection Time: 04/29/17  2:33 PM  Result Value Ref Range   Hemoglobin A1C 6.4      Assessment/Plan:  Diabetes mellitus without complication (HCC) I6O 6.4.  She has been specifically trying to eat healthier and has notably improved her BP.    Plan is to continue with her current regimen and congratulate her on lifestyle modifications  HYPERTENSION, BENIGN SYSTEMIC She says that concerns about her high blood pressure were discussed at her last visit and she made a commitment to improve her diet.  She has reduced her processed meat intake and has been focusing on adding  more vegetables.  She has been feeling well and expresses no physical complaints at this time.  Continue current regimen   Sherene Sires, [place] - PGY1 05/01/2017 10:47 AM

## 2017-05-01 NOTE — Assessment & Plan Note (Signed)
A1C 6.4.  She has been specifically trying to eat healthier and has notably improved her BP.    Plan is to continue with her current regimen and congratulate her on lifestyle modifications

## 2017-05-01 NOTE — Assessment & Plan Note (Signed)
She says that concerns about her high blood pressure were discussed at her last visit and she made a commitment to improve her diet.  She has reduced her processed meat intake and has been focusing on adding more vegetables.  She has been feeling well and expresses no physical complaints at this time.  Continue current regimen

## 2017-10-03 ENCOUNTER — Ambulatory Visit (INDEPENDENT_AMBULATORY_CARE_PROVIDER_SITE_OTHER): Payer: Medicare Other | Admitting: Cardiovascular Disease

## 2017-10-03 ENCOUNTER — Encounter: Payer: Self-pay | Admitting: Cardiovascular Disease

## 2017-10-03 VITALS — BP 124/80 | HR 75 | Ht 62.0 in | Wt 199.0 lb

## 2017-10-03 DIAGNOSIS — I10 Essential (primary) hypertension: Secondary | ICD-10-CM

## 2017-10-03 DIAGNOSIS — R002 Palpitations: Secondary | ICD-10-CM

## 2017-10-03 DIAGNOSIS — E1169 Type 2 diabetes mellitus with other specified complication: Secondary | ICD-10-CM | POA: Diagnosis not present

## 2017-10-03 DIAGNOSIS — E785 Hyperlipidemia, unspecified: Secondary | ICD-10-CM

## 2017-10-03 NOTE — Assessment & Plan Note (Signed)
History of palpitations in the past which is no longer an issue.

## 2017-10-03 NOTE — Assessment & Plan Note (Signed)
History of hyperlipidemia on statin therapy. 

## 2017-10-03 NOTE — Progress Notes (Signed)
10/03/2017 Brezlyn Manrique Centner   03-09-1940  [id number]  Primary Physician Sherene Sires, DO Primary Cardiologist: Lorretta Harp MD Lupe Carney, [place]  HPI:  Nyia Tsao Pasko is a 77 y.o.  moderately overweight widowed African American female mother of 4 children (one son recently passed away), grandmother and 35 grandchildren, referred by her primary care physician, Dr. Paulla Fore at [facility] family practice for evaluation of palpitations.  I last saw her in the office 09/01/2013.  She is retired from working in [employer] for 20 years. Her cardiovascular risk factor profile is notable for treated hypertension, hyperlipidemia and diabetes. For children, both sons, and had stents in the past. She has never had a heart attack or stroke, denies chest pain or shortness of breath. She said "fluttering in her chest" for last year off and on occurring every other day and lasting seconds at a time. They often cause her to cough. She did denies other associated symptoms. TSH was measured in the normal range. She drinks one cup of caffeinated coffee a day. Since I saw her 4 years ago her palpitations have since resolved.  She denies chest pain or shortness of breath.   Current Meds  Medication Sig  . acetaminophen (TYLENOL) 500 MG tablet Take 1 tablet (500 mg total) by mouth every 6 (six) hours as needed.  Marland Kitchen aspirin EC 81 MG tablet Take 81 mg by mouth daily.  . diclofenac sodium (VOLTAREN) 1 % GEL Apply 2 g topically 4 (four) times daily.  Marland Kitchen losartan-hydrochlorothiazide (HYZAAR) 100-12.5 MG tablet Take 1 tablet by mouth daily.  . metFORMIN (GLUCOPHAGE) 500 MG tablet Take 1 tablet (500 mg total) by mouth 2 (two) times daily with a meal.  . Multiple Vitamins-Minerals (WOMENS ONE DAILY) TABS Take 1 tablet by mouth daily.  . pravastatin (PRAVACHOL) 40 MG tablet Take 1 tablet (40 mg total) by mouth daily.  . timolol (BETIMOL) 0.5 % ophthalmic solution Place 1 drop into the right eye 2  (two) times daily. (Patient taking differently: Place 2 drops into both eyes daily. )     Allergies  Allergen Reactions  . Amoxicillin Other (See Comments)    REACTION: arrythmia  . Sulfamethoxazole Hives and Nausea And Vomiting    REACTION: urticaria (hives) & weakness  . Sulfonamide Derivatives Hives and Nausea And Vomiting    Weakness  . Codeine Other (See Comments)    Dry mouth  . Penicillins Hives    Social History   Socioeconomic History  . Marital status: Widowed    Spouse name: Not on file  . Number of children: 5  . Years of education: Not on file  . Highest education level: Not on file  Occupational History  . Not on file  Social Needs  . Financial resource strain: Not on file  . Food insecurity:    Worry: Not on file    Inability: Not on file  . Transportation needs:    Medical: Not on file    Non-medical: Not on file  Tobacco Use  . Smoking status: Never Smoker  . Smokeless tobacco: Former Network engineer and Sexual Activity  . Alcohol use: No  . Drug use: No  . Sexual activity: Never  Lifestyle  . Physical activity:    Days per week: Not on file    Minutes per session: Not on file  . Stress: Not on file  Relationships  . Social connections:    Talks on phone: Not  on file    Gets together: Not on file    Attends religious service: Not on file    Active member of club or organization: Not on file    Attends meetings of clubs or organizations: Not on file    Relationship status: Not on file  . Intimate partner violence:    Fear of current or ex partner: Not on file    Emotionally abused: Not on file    Physically abused: Not on file    Forced sexual activity: Not on file  Other Topics Concern  . 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  5 children, lives alone.   Pt exercises by walking every other day.           Review of  Systems: General: negative for chills, fever, night sweats or weight changes.  Cardiovascular: negative for chest pain, dyspnea on exertion, edema, orthopnea, palpitations, paroxysmal nocturnal dyspnea or shortness of breath Dermatological: negative for rash Respiratory: negative for cough or wheezing Urologic: negative for hematuria Abdominal: negative for nausea, vomiting, diarrhea, bright red blood per rectum, melena, or hematemesis Neurologic: negative for visual changes, syncope, or dizziness All other systems reviewed and are otherwise negative except as noted above.    Blood pressure 124/80, pulse 75, height 5\' 2"  (1.575 m), weight 199 lb (90.3 kg).  General appearance: alert and no distress Neck: no adenopathy, no carotid bruit, no JVD, supple, symmetrical, trachea midline and thyroid not enlarged, symmetric, no tenderness/mass/nodules Lungs: clear to auscultation bilaterally Heart: regular rate and rhythm, S1, S2 normal, no murmur, click, rub or gallop Extremities: extremities normal, atraumatic, no cyanosis or edema Pulses: 2+ and symmetric Skin: Skin color, texture, turgor normal. No rashes or lesions Neurologic: Alert and oriented X 3, normal strength and tone. Normal symmetric reflexes. Normal coordination and gait  EKG normal sinus rhythm at 75 without ST or T wave changes.  I personally reviewed this EKG.  ASSESSMENT AND PLAN:   HYPERTENSION, BENIGN SYSTEMIC History of essential hypertension her blood pressure measured today at 124/80.  She is on losartan and hydrochlorothiazide.  Hyperlipidemia associated with type 2 diabetes mellitus History of hyperlipidemia on statin therapy   Heart palpitations History of palpitations in the past which is no longer an issue.      Lorretta Harp MD FACP,FACC,FAHA, [facility] 10/03/2017 12:47 PM

## 2017-10-03 NOTE — Patient Instructions (Signed)
Your physician recommends that you schedule a follow-up appointment in: AS NEEDED  

## 2017-10-03 NOTE — Assessment & Plan Note (Signed)
History of essential hypertension her blood pressure measured today at 124/80.  She is on losartan and hydrochlorothiazide.

## 2017-10-10 ENCOUNTER — Ambulatory Visit: Payer: Medicare Other | Admitting: Family Medicine

## 2017-11-11 ENCOUNTER — Emergency Department (HOSPITAL_COMMUNITY)
Admission: EM | Admit: 2017-11-11 | Discharge: 2017-11-11 | Disposition: A | Discharge status: Home / Self Care | Payer: Medicare Other | Attending: Emergency Medicine | Admitting: Emergency Medicine

## 2017-11-11 ENCOUNTER — Encounter (HOSPITAL_COMMUNITY): Payer: Self-pay

## 2017-11-11 ENCOUNTER — Other Ambulatory Visit: Payer: Self-pay

## 2017-11-11 DIAGNOSIS — Z7984 Long term (current) use of oral hypoglycemic drugs: Secondary | ICD-10-CM | POA: Insufficient documentation

## 2017-11-11 DIAGNOSIS — Z79899 Other long term (current) drug therapy: Secondary | ICD-10-CM | POA: Insufficient documentation

## 2017-11-11 DIAGNOSIS — Z7982 Long term (current) use of aspirin: Secondary | ICD-10-CM | POA: Diagnosis not present

## 2017-11-11 DIAGNOSIS — I10 Essential (primary) hypertension: Secondary | ICD-10-CM | POA: Diagnosis not present

## 2017-11-11 DIAGNOSIS — R42 Dizziness and giddiness: Secondary | ICD-10-CM | POA: Insufficient documentation

## 2017-11-11 DIAGNOSIS — E119 Type 2 diabetes mellitus without complications: Secondary | ICD-10-CM | POA: Diagnosis not present

## 2017-11-11 LAB — CBG MONITORING, ED: Glucose-Capillary: 141 mg/dL — ABNORMAL HIGH (ref 70–99)

## 2017-11-11 LAB — BASIC METABOLIC PANEL
ANION GAP: 12 (ref 5–15)
BUN: 15 mg/dL (ref 8–23)
CALCIUM: 9.5 mg/dL (ref 8.9–10.3)
CO2: 28 mmol/L (ref 22–32)
Chloride: 102 mmol/L (ref 98–111)
Creatinine, Ser: 1.01 mg/dL — ABNORMAL HIGH (ref 0.44–1.00)
GFR calc Af Amer: >60 mL/min (ref >60)
GFR, EST NON AFRICAN AMERICAN: 52 mL/min — AB (ref >60)
GLUCOSE: 142 mg/dL — AB (ref 70–99)
Potassium: 4.1 mmol/L (ref 3.5–5.1)
SODIUM: 142 mmol/L (ref 135–145)

## 2017-11-11 LAB — URINALYSIS, ROUTINE W REFLEX MICROSCOPIC
Bilirubin Urine: NEGATIVE
Glucose, UA: NEGATIVE mg/dL
Hgb urine dipstick: NEGATIVE
Ketones, ur: NEGATIVE mg/dL
Leukocytes, UA: NEGATIVE
Nitrite: NEGATIVE
Protein, ur: NEGATIVE mg/dL
Specific Gravity, Urine: 1.005 (ref 1.005–1.030)
pH: 8 (ref 5.0–8.0)

## 2017-11-11 LAB — CBC
HCT: 38.5 % (ref 36.0–46.0)
Hemoglobin: 11.7 g/dL — ABNORMAL LOW (ref 12.0–15.0)
MCH: 24.7 pg — ABNORMAL LOW (ref 26.0–34.0)
MCHC: 30.4 g/dL (ref 30.0–36.0)
MCV: 81.2 fL (ref 80.0–100.0)
PLATELETS: 344 10*3/uL (ref 150–400)
RBC: 4.74 MIL/uL (ref 3.87–5.11)
RDW: 14.5 % (ref 11.5–15.5)
WBC: 6.3 10*3/uL (ref 4.0–10.5)
nRBC: 0 % (ref 0.0–0.2)

## 2017-11-11 LAB — TROPONIN I

## 2017-11-11 NOTE — ED Triage Notes (Signed)
She c/o dizziness which began at about 2100 hours yesterday. She is alert and oriented x 4 with clear speech.

## 2017-11-11 NOTE — ED Notes (Signed)
She has just ambulated to b.r. And back without assistance with no difficulty.

## 2017-11-11 NOTE — ED Provider Notes (Signed)
[place] DEPT Provider Note  CSN: [id number] Arrival date & time: 11/11/17  0845  History   Chief Complaint Chief Complaint  Patient presents with  . Dizziness    HPI Amber [name] is a 77 y.o. female with a medical history of CLL, HLD, HTN and Type 2 DM who presented to the ED for dizziness.  The history is provided by the patient.  Dizziness  Quality:  Lightheadedness Severity:  Mild Onset quality:  Gradual Duration: < 2 minutes. Timing: When moving from sitting to standing. Progression:  Resolved Chronicity:  Recurrent (Reports having similar episodes 2 months ago) Context: standing up   Relieved by:  Being still Ineffective treatments:  None tried Associated symptoms: nausea   Associated symptoms: no blood in stool, no chest pain, no diarrhea, no headaches, no hearing loss, no palpitations, no shortness of breath, no syncope, no tinnitus, no vision changes, no vomiting and no weakness    Additional history obtained by medical chart. Patient seen by PCP in 03/2017 for dizziness which was related to HTN medication. Patient denies any recent medication changes or new medications started. Past Medical History:  Diagnosis Date  . Anemia   . Arthritis   . Back pain   . Blood transfusion without reported diagnosis   . CLL (chronic lymphocytic leukemia) ([place]) 09/21/2015  . Dizziness 12/27/2014  . H/O echocardiogram    a. 2015: echo showing a preserved EF of 60-65%, Grade 1 DD and mild MR.  Marland Kitchen History of cardiac cath    a. normal cors by cath in 03/2010  . Hyperlipidemia   . Palpitations     Patient Active Problem List   Diagnosis Date Noted  . Lower extremity edema 09/30/2016  . Swelling of lower extremity 09/16/2016  . Right hip pain 03/19/2016  . Microcytic anemia 10/05/2015  . Meralgia paraesthetica 10/04/2015  . Atypical lymphocytosis 09/21/2015  . Left arm weakness 08/18/2015  . Greater trochanteric bursitis of right hip  07/06/2013  . Heart palpitations 05/01/2012  . Chronic sinusitis 09/02/2011  . GERD 06/05/2006  . Diabetes mellitus without complication ([place]) 61/60/7371  . Hyperlipidemia associated with type 2 diabetes mellitus ([place]) 04/03/2006  . OBESITY, NOS 04/03/2006  . Iron deficiency anemia 04/03/2006  . HYPERTENSION, BENIGN SYSTEMIC 04/03/2006  . MITRAL VALVE PROLAPSE 04/03/2006  . OSTEOARTHRITIS, MULTI SITES 04/03/2006  . Proteinuria 04/03/2006    Past Surgical History:  Procedure Laterality Date  . ABDOMINAL HYSTERECTOMY    . COLON SURGERY       OB History   None      Home Medications    Prior to Admission medications   Medication Sig Start Date End Date Taking? Authorizing Provider  acetaminophen (TYLENOL) 500 MG tablet Take 1 tablet (500 mg total) by mouth every 6 (six) hours as needed. Patient taking differently: Take 500 mg by mouth every 6 (six) hours as needed for mild pain.  03/18/15  Yes Antonietta Breach, PA-C  aspirin EC 81 MG tablet Take 81 mg by mouth daily.   Yes [provider]  diclofenac sodium (VOLTAREN) 1 % GEL Apply 2 g topically 4 (four) times daily. Patient taking differently: Apply 2 g topically daily as needed (pain and inflammation).  03/21/17  Yes Eloise Levels, MD  losartan-hydrochlorothiazide (HYZAAR) 100-12.5 MG tablet Take 1 tablet by mouth daily. 01/10/17  Yes Eloise Levels, MD  metFORMIN (GLUCOPHAGE) 500 MG tablet Take 1 tablet (500 mg total) by mouth 2 (two) times daily with  a meal. 09/16/16  Yes Eloise Levels, MD  Multiple Vitamins-Minerals (WOMENS ONE DAILY) TABS Take 1 tablet by mouth daily.   Yes [provider]  pravastatin (PRAVACHOL) 40 MG tablet Take 1 tablet (40 mg total) by mouth daily. 01/10/17  Yes Eloise Levels, MD  timolol (BETIMOL) 0.5 % ophthalmic solution Place 1 drop into the right eye 2 (two) times daily. 04/12/13  Yes Gerda Diss, DO    Family History Family History  Problem Relation Age of Onset  .  Pancreatic cancer Sister        3 sisters died of it  . Heart attack Father 42       Massive MI  . Diabetes Mother   . Cancer Mother   . Hypertension Son   . Congestive Heart Failure Son   . Diabetes Son   . Hypertension Daughter     Social History Social History   Tobacco Use  . Smoking status: Never Smoker  . Smokeless tobacco: Former Network engineer Use Topics  . Alcohol use: No  . Drug use: No     Allergies   Amoxicillin; Sulfamethoxazole; Sulfonamide derivatives; Codeine; and Penicillins   Review of Systems Review of Systems  Constitutional: Negative.   HENT: Negative for hearing loss and tinnitus.   Eyes:       Reports current treatment for glaucoma and has decreased vision in right eye  Respiratory: Negative for shortness of breath.   Cardiovascular: Negative for chest pain, palpitations and syncope.  Gastrointestinal: Positive for nausea. Negative for blood in stool, diarrhea and vomiting.  Musculoskeletal: Negative for gait problem.  Skin: Negative.   Neurological: Positive for dizziness and light-headedness. Negative for syncope, facial asymmetry, speech difficulty, weakness, numbness and headaches.  Psychiatric/Behavioral: Negative for confusion and decreased concentration.   Physical Exam Updated Vital Signs BP (!) 162/122   Pulse 65   Temp 97.9 F (36.6 C) (Oral)   Resp 19   Ht 5\' 2"  (1.575 m)   Wt 90.3 kg   SpO2 98%   BMI 36.40 kg/m   Physical Exam  Constitutional: She is oriented to person, place, and time. She is cooperative. She does not appear ill. No distress.  HENT:  Head: Normocephalic and atraumatic.  Right Ear: Tympanic membrane, external ear and ear canal normal.  Left Ear: Tympanic membrane, external ear and ear canal normal.  Eyes: Pupils are equal, round, and reactive to light. Conjunctivae, EOM and lids are normal.  Neck: Normal range of motion and full passive range of motion without pain. Neck supple. Normal carotid pulses  present. No spinous process tenderness and no muscular tenderness present. Carotid bruit is not present. Normal range of motion present.  Cardiovascular: Normal rate, regular rhythm and normal heart sounds.  No murmur heard. Pulmonary/Chest: Effort normal and breath sounds normal.  Abdominal: Soft. Normal appearance and bowel sounds are normal. There is no tenderness.  Musculoskeletal: Normal range of motion.  Neurological: She is alert and oriented to person, place, and time. She has normal strength. No cranial nerve deficit or sensory deficit. She exhibits normal muscle tone. GCS eye subscore is 4. GCS verbal subscore is 5. GCS motor subscore is 6.  Reflex Scores:      Tricep reflexes are 1+ on the right side and 1+ on the left side.      Bicep reflexes are 1+ on the right side and 1+ on the left side.      Brachioradialis reflexes are 1+  on the right side and 1+ on the left side. Skin: Skin is warm and intact. Capillary refill takes less than 2 seconds.  Psychiatric: She has a normal mood and affect. Her speech is normal and behavior is normal. Thought content normal. Cognition and memory are normal.  Nursing note and vitals reviewed.    ED Treatments / Results  Labs (all labs ordered are listed, but only abnormal results are displayed) Labs Reviewed  URINALYSIS, ROUTINE W REFLEX MICROSCOPIC - Abnormal; Notable for the following components:      Result Value   Color, Urine STRAW (*)    All other components within normal limits  CBC - Abnormal; Notable for the following components:   Hemoglobin 11.7 (*)    MCH 24.7 (*)    All other components within normal limits  CBG MONITORING, ED - Abnormal; Notable for the following components:   Glucose-Capillary 141 (*)    All other components within normal limits  BASIC METABOLIC PANEL  TROPONIN I    EKG EKG Interpretation  Date/Time:  Tuesday November 11 2017 08:59:19 EDT Ventricular Rate:  67 PR Interval:    QRS Duration: 101 QT  Interval:  390 QTC Calculation: 412 R Axis:   -62 Text Interpretation:  Sinus rhythm Consider left atrial enlargement LAD, consider left anterior fascicular block Borderline T wave abnormalities Confirmed by Virgel Manifold [phone]) on 11/11/2017 10:13:44 AM   Radiology No results found.  Procedures Procedures (including critical care time)  Medications Ordered in ED Medications - No data to display   Initial Impression / Assessment and Plan / ED Course  Triage vital signs and the nursing notes have been reviewed.  Pertinent labs & imaging results that were available during care of the patient were reviewed and considered in medical decision making (see chart for details).  Patient presents to the ED following a dizzy episode that lasted less than 2 minutes the night prior after moving from a seated to standing position. She endorsed lightheadedness and nausea during that episode. Denies cardiac, pulm or other neuro symptoms at that time. Patient is currently asymptomatic. Physical exam is unremarkable. There are no focal neuro deficits that raise concern for an acute intracranial process like stroke or hemorrhage. While history and current clinical presentation is most consistent with orthostasis, given pt's age and medical history it is necessary to evaluate for cardiac, infectious and metabolic etiologies to complaint. Will proceed with typical syncope work-up.  Clinical Course as of Nov 11 1220  Tue Nov 11, 2017  1014 Hypertensive upon arrival. Has HTN diagnosis and reports not taking her medication before coming to the ED.   [GM]  1104 UA not indicative of UTI. CBC and CBG unremarkable.   [GM]  1113 EKG showed NSR. No ST elevations/depressions or signs of acute ischemia or infarct. This is reassuring in combination with negative troponin which assists in evaluating and ruling out an acute cardiac process.   [GM]  1219 Orthostatic vitals taken. No decrease in BP with position changes  to suggest orthostasis.  Standing: 157/85 Sitting: 163/81 Lying:  152/65   [GM]    Clinical Course User Index [GM] Jamorion Gomillion, Jonelle Sports, PA-C   Final Clinical Impressions(s) / ED Diagnoses  1. Dizziness. Possibly due to dehydration. Education provided on s/s that would warrant follow-up with medical provider. Education on lifestyle modifications to help mitigate dizziness and associated s/s with position changes. Advised to discuss with PCP if episodes become more frequent or severe.  Dispo: Home. After  thorough clinical evaluation, this patient is determined to be medically stable and can be safely discharged with the previously mentioned treatment and/or outpatient follow-up/referral(s). At this time, there are no other apparent medical conditions that require further screening, evaluation or treatment.  Final diagnoses:  Dizziness    ED Discharge Orders    None        Junita Push 11/11/17 1226    Virgel Manifold, MD 11/12/17 1313

## 2017-11-11 NOTE — Discharge Instructions (Signed)
Your lab work and evaluation today looked good and was normal.  I suspect that your dizzy episode last night was due to changing positions too quickly. Be sure to take your time when moving from one position to another and to take short (30 seconds) rest when going from lying to sitting to standing.  If these dizzy spells become more frequent, please be sure to discuss with your cardiologist or PCP.  Thank you for allowing me to take care of you today! Be sure to stay hydrated.

## 2017-11-19 ENCOUNTER — Encounter: Payer: Self-pay | Admitting: Family Medicine

## 2017-11-19 ENCOUNTER — Ambulatory Visit (INDEPENDENT_AMBULATORY_CARE_PROVIDER_SITE_OTHER): Payer: Medicare Other | Admitting: Family Medicine

## 2017-11-19 ENCOUNTER — Ambulatory Visit (INDEPENDENT_AMBULATORY_CARE_PROVIDER_SITE_OTHER): Payer: Medicare Other | Admitting: Licensed Clinical Social Worker

## 2017-11-19 ENCOUNTER — Other Ambulatory Visit: Payer: Self-pay

## 2017-11-19 VITALS — BP 156/72 | HR 76 | Temp 98.2°F | Ht 62.0 in | Wt 198.0 lb

## 2017-11-19 DIAGNOSIS — F439 Reaction to severe stress, unspecified: Secondary | ICD-10-CM | POA: Diagnosis not present

## 2017-11-19 DIAGNOSIS — F4329 Adjustment disorder with other symptoms: Secondary | ICD-10-CM

## 2017-11-19 DIAGNOSIS — E119 Type 2 diabetes mellitus without complications: Secondary | ICD-10-CM

## 2017-11-19 DIAGNOSIS — I10 Essential (primary) hypertension: Secondary | ICD-10-CM | POA: Diagnosis not present

## 2017-11-19 LAB — POCT GLYCOSYLATED HEMOGLOBIN (HGB A1C): HBA1C, POC (CONTROLLED DIABETIC RANGE): 6.3 % (ref 0.0–7.0)

## 2017-11-19 MED ORDER — METOPROLOL TARTRATE 50 MG PO TABS: ORAL_TABLET | ORAL | 3 refills | Status: DC

## 2017-11-19 MED ORDER — METFORMIN HCL 500 MG PO TABS: 500.0000 mg | ORAL_TABLET | Freq: Every day | ORAL | 3 refills | Status: DC

## 2017-11-19 NOTE — Assessment & Plan Note (Signed)
Reviewed her hemoglobin A1c which is excellent.  We will try to decrease her metformin to once daily and have her follow-up for repeat A1c in 3 months.

## 2017-11-19 NOTE — Assessment & Plan Note (Signed)
Long discussion.  She seems very concerned about these elevated blood pressures that she is concerned that we will give her a stroke.  Will restart her metoprolol low-dose.  Follow-up with me or her PCP in the next 2 to 3 weeks.

## 2017-11-19 NOTE — Progress Notes (Signed)
    CHIEF COMPLAINT / HPI: 1.  Concern about blood pressures.  Checking them at home and in the last week or so they have been in the 160s to 170s which is unusual for her.  She is also noted more palpitations.  Has not had chest pain.  No change in exercise tolerance.  No lower extremity swelling. #2.  Last week her son was diagnosed with lung cancer and this is been very stressful for her.  She feels a little bit nauseated and anxious at all times.  Is not sure if this is related to her blood pressure issues or if this is related to stress.  She has 1 sister who is her main support system.  REVIEW OF SYSTEMS: No abdominal pain.  No change in bowel or bladder habits.  Appetite is slightly decreased.  Sleep is interrupted.  Denies suicidal or homicidal ideation.  Denies tearfulness denies problems focusing.  PERTINENT  PMH / PSH: I have reviewed the patient's medications, allergies, past medical and surgical history, smoking status and updated in the EMR as appropriate.   OBJECTIVE:  Vital signs reviewed. GENERAL: Well-developed, well-nourished, no acute distress. CARDIOVASCULAR: Regular rate and rhythm no murmur gallop or rub LUNGS: Clear to auscultation bilaterally, no rales or wheeze. ABDOMEN: Soft positive bowel sounds NEURO: No gross focal neurological deficits. MSK: Movement of extremity x 4. PSYCHIATRIC: Alert and oriented x4.  Affect is interactive although mildly constricted.  She answers and asks questions appropriately.  Speech is low in volume.  Her movements are mildly constricted.  She has normal judgment and intact recent and remote memory  ASSESSMENT / PLAN:  No problem-specific Assessment & Plan notes found for this encounter.

## 2017-11-19 NOTE — Patient Instructions (Signed)
I have restarted your metoprolol.  I have called in a new prescription.  If you have any questions about this, please call my office.  I will have you take 1/2 pill twice a day.  Continue your other blood pressure medicine for right now.  As we discussed, I would recommend you decrease the metformin from twice daily to once daily.  We can recheck your A1c in 2 to 3 months.  You have excellent control right now and I suspect that 1 pill a day will continue to give you that control.  I am sorry to hear about your son's recent diagnosis.  I really hope things go well for him.  Your new physician, Dr. Criss Rosales is looked up for the next 2 weeks so I have scheduled you to see me back in a couple of weeks.  I look forward to seeing you.

## 2017-11-19 NOTE — Assessment & Plan Note (Signed)
I am unsure which of her symptoms are related to elevated blood pressure and which are related to recent stressors.  The elevated BP may be related to recent stressors. Warm Handoff and f/u with me or PCP in 2 -3 weeks

## 2017-11-19 NOTE — BH Specialist Note (Signed)
Integrated Behavioral Health Warm Handoff  MRN: [id number] Name: Amber [name]  Session Start time: 10:30  Session End time: 10:50 Total time: 20 minutes  Type of Service: He[place] Interpretor:No. Interpretor Name and Language: NA   Warm Hand Off Completed.     Patient verbally consented to meet with [facility] Consultant about presenting concerns. SUBJECTIVE: Amber [name] is a 77 y.o. female referred by Dr. Nori Riis for assistance with managing stress.  Patient reports : having difficulty with managing stress related to recent diagnosis of son having lung cancer. Duration of problem: 2 weeks; difficult sleeping, can't stop thinking about her son, worrying and not wanting to do things she previously enjoyed and spending time at home alone. Mental Health History/ Substance use: none. OBJECTIVE: Mood: sad and Affect: appropriate. Risk of harm to self or others: No plan to harm self or others LIFE CONTEXT: Family and Social: lives alone in senior apartments, has 4 children and 16 grand, uses bus for transportation or family takes to appointments Self-Care: goes out with sister, previously enjoyed going to [employer] Life Changes: recent diagnosis for son GOALS ADDRESSED: Patient will: 1. Reduce symptoms of: anxiety and stress 2. Increase knowledge and/or ability of: coping skills and stress reduction  3. Self-care, start doing things she enjoys 4.    Support son INTERVENTIONS:  Mindfulness or Psychologist, educational, Veterinary surgeon and Supportive Counseling, Psychoeducation on stress Standardized Assessments completed: Not Needed Issues discussed: support system, previous and current coping skills, things patient enjoys doing, self-care, demonstration of relaxed breathing and Integrated Behavioral Health services. ASSESSMENT: Patient is currently experiencing symptoms of stress due to difficulty with new of son's medical diagnosis. Patient may  benefit from and is in agreement to implement brief therapeutic interventions discussed today to assist with managing her symptoms.  PLAN: 1. Follow up with behavioral health clinician: in 2 weeks  2. Behavioral recommendations: relaxed breathing 3 times daily, Bingo (Behavioral Activation) 3. Referral(s): none at this time  Casimer Lanius, [place]   [phone] 12:13 PM

## 2017-12-03 ENCOUNTER — Ambulatory Visit: Payer: Medicare Other | Admitting: Family Medicine

## 2017-12-03 ENCOUNTER — Ambulatory Visit: Payer: Medicare Other

## 2017-12-17 ENCOUNTER — Ambulatory Visit: Payer: Medicare Other | Admitting: Family Medicine

## 2017-12-22 ENCOUNTER — Other Ambulatory Visit: Payer: Self-pay | Admitting: *Deleted

## 2017-12-22 MED ORDER — METFORMIN HCL 500 MG PO TABS: 500.0000 mg | ORAL_TABLET | Freq: Every day | ORAL | 3 refills | Status: DC

## 2017-12-29 ENCOUNTER — Telehealth: Payer: Self-pay | Admitting: *Deleted

## 2017-12-29 NOTE — Telephone Encounter (Signed)
Pt states that her metformin is making her nauseous.   Upon review of her chart she has been on metformin for several years and it was recently decreased.   She denies vomiting, diarrhea, or fever. I advised to continue metformin as prescribed as it was unlikely to be the cause of the nausea.  Advised to try BRAT diet for nausea, but I would send message to MD for additional info.  To MD to advise. Fleeger, Salome Spotted, CMA

## 2018-01-07 NOTE — Telephone Encounter (Signed)
Can this be signed? Arretta Toenjes, Salome Spotted, CMA

## 2018-01-07 NOTE — Telephone Encounter (Signed)
I was seeing if it had been addressed.  Have not heard back from pt. Serrina Minogue, Salome Spotted, CMA

## 2018-01-09 NOTE — Telephone Encounter (Signed)
Tried to contact pt to inquire about below and phone only rang with no option to LVM.  If pt calls back please ask her if she is doing better or still having problems. Katharina Caper, Itzamar Traynor D, CMA          Sherene Sires, DO  P [facility]  Caller: Unspecified (1 week ago)        Please call patient and see if she has been able to keep food down. This nausea shuold not be caused by a reduction metformin. If she has been unable to eat, she needs to be seen by a doctor

## 2018-01-13 NOTE — Telephone Encounter (Signed)
Tried to contact pt a second time with no answer.  Phone only rang.  If pt calls back check to see if she is doing better and if not assist her in getting an appointment scheduled. Katharina Caper, Kierra Jezewski D, [name]

## 2018-01-26 ENCOUNTER — Ambulatory Visit: Payer: Medicare Other | Admitting: Family Medicine

## 2018-01-26 ENCOUNTER — Ambulatory Visit (HOSPITAL_COMMUNITY)
Admission: RE | Admit: 2018-01-26 | Discharge: 2018-01-26 | Disposition: A | Discharge status: Home / Self Care | Payer: Medicare Other | Source: Ambulatory Visit | Attending: Family Medicine | Admitting: Family Medicine

## 2018-01-26 ENCOUNTER — Other Ambulatory Visit: Payer: Self-pay

## 2018-01-26 ENCOUNTER — Encounter: Payer: Self-pay | Admitting: Family Medicine

## 2018-01-26 VITALS — BP 152/70 | HR 61 | Temp 98.1°F | Ht 62.0 in | Wt 200.2 lb

## 2018-01-26 DIAGNOSIS — R079 Chest pain, unspecified: Secondary | ICD-10-CM | POA: Diagnosis not present

## 2018-01-26 DIAGNOSIS — R001 Bradycardia, unspecified: Secondary | ICD-10-CM | POA: Insufficient documentation

## 2018-01-26 DIAGNOSIS — R29898 Other symptoms and signs involving the musculoskeletal system: Secondary | ICD-10-CM | POA: Diagnosis not present

## 2018-01-26 MED ORDER — ISOSORBIDE MONONITRATE ER 30 MG PO TB24: 30.0000 mg | ORAL_TABLET | Freq: Every day | ORAL | 6 refills | Status: DC

## 2018-01-26 NOTE — Assessment & Plan Note (Signed)
While I am unclear why she attributed her symptoms to the metoprolol, I do feel reasonable to stop with bradycardia.  She has been off metoprolol (not taking) for three days and was on a low dose to start with.  At this point, no need to wean.

## 2018-01-26 NOTE — Assessment & Plan Note (Signed)
Atypical for angina or ACS.  She has risk factors. More likely musculoskeletal. Rx with imdur Stop metoprolol given bradycardia on EKG. Recheck in three days.

## 2018-01-26 NOTE — Progress Notes (Signed)
Established Patient Office Visit  Subjective:  Patient ID: Amber [name], female    DOB: August 02, 1940  Age: 77 y.o. MRN: [id number]  CC:  Chief Complaint  Patient presents with  . Head Congestion    HPI Amber [name] presents for chest pain and left arm weakness.  Yes she does have minor head congestion.  But her real concern is left arm heaviness x 4 days.  She said it started with some chest pain lasting seconds to minutes.  Then she will have left arm heaviness for up to an hour afterward.  No associated signs or symptoms.  Does not come on with exercise, position or eating.  At this moment she has no chest pain or arm heaviness.  For some reason, she attributes the metoprolol that she has been taking as causing or worsening this problem.  No history of CAD or CVD.  She is on a statin and aspirin daily for primary prevention.  By my review of her chart, no previous stress test.  On her problem list is left arm weakness felt to be musculoskeletal.    Past Medical History:  Diagnosis Date  . Anemia   . Arthritis   . Back pain   . Blood transfusion without reported diagnosis   . CLL (chronic lymphocytic leukemia) ([place]) 09/21/2015  . Dizziness 12/27/2014  . H/O echocardiogram    a. 2015: echo showing a preserved EF of 60-65%, Grade 1 DD and mild MR.  Marland Kitchen History of cardiac cath    a. normal cors by cath in 03/2010  . Hyperlipidemia   . Palpitations     Past Surgical History:  Procedure Laterality Date  . ABDOMINAL HYSTERECTOMY    . COLON SURGERY      Family History  Problem Relation Age of Onset  . Pancreatic cancer Sister        3 sisters died of it  . Heart attack Father 53       Massive MI  . Diabetes Mother   . Cancer Mother   . Hypertension Son   . Congestive Heart Failure Son   . Diabetes Son   . Hypertension Daughter     Social History   Socioeconomic History  . Marital status: Widowed    Spouse name: Not on file  . Number of children: 5  .  Years of education: Not on file  . Highest education level: Not on file  Occupational History  . Not on file  Social Needs  . Financial resource strain: Not on file  . Food insecurity:    Worry: Not on file    Inability: Not on file  . Transportation needs:    Medical: Not on file    Non-medical: Not on file  Tobacco Use  . Smoking status: Never Smoker  . Smokeless tobacco: Former Network engineer and Sexual Activity  . Alcohol use: No  . Drug use: No  . Sexual activity: Never  Lifestyle  . Physical activity:    Days per week: Not on file    Minutes per session: Not on file  . Stress: Not on file  Relationships  . Social connections:    Talks on phone: Not on file    Gets together: Not on file    Attends religious service: Not on file    Active member of club or organization: Not on file    Attends meetings of clubs or organizations: Not on file    Relationship status:  Not on file  . Intimate partner violence:    Fear of current or ex partner: Not on file    Emotionally abused: Not on file    Physically abused: Not on file    Forced sexual activity: Not on file  Other Topics Concern  . 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  5 children, lives alone.   Pt exercises by walking every other day.          Outpatient Medications Prior to Visit  Medication Sig Dispense Refill  . acetaminophen (TYLENOL) 500 MG tablet Take 1 tablet (500 mg total) by mouth every 6 (six) hours as needed. (Patient taking differently: Take 500 mg by mouth every 6 (six) hours as needed for mild pain. ) 30 tablet 0  . aspirin EC 81 MG tablet Take 81 mg by mouth daily.    . diclofenac sodium (VOLTAREN) 1 % GEL Apply 2 g topically 4 (four) times daily. (Patient taking differently: Apply 2 g topically daily as needed (pain and inflammation). ) 1 Tube 3  . losartan-hydrochlorothiazide (HYZAAR)  100-12.5 MG tablet Take 1 tablet by mouth daily. 90 tablet 4  . metFORMIN (GLUCOPHAGE) 500 MG tablet Take 1 tablet (500 mg total) by mouth daily. 90 tablet 3  . Multiple Vitamins-Minerals (WOMENS ONE DAILY) TABS Take 1 tablet by mouth daily.    . pravastatin (PRAVACHOL) 40 MG tablet Take 1 tablet (40 mg total) by mouth daily. 90 tablet 3  . timolol (BETIMOL) 0.5 % ophthalmic solution Place 1 drop into the right eye 2 (two) times daily. 10 mL 12  . metoprolol tartrate (LOPRESSOR) 50 MG tablet Take one half tab twice a day by mouth (Patient not taking: Reported on 01/26/2018) 90 tablet 3   No facility-administered medications prior to visit.     Allergies  Allergen Reactions  . Amoxicillin Itching and Other (See Comments)    REACTION: arrythmia Has patient had a PCN reaction causing immediate rash, facial/tongue/throat swelling, SOB or lightheadedness with hypotension: Yes Has patient had a PCN reaction causing severe rash involving mucus membranes or skin necrosis: No Has patient had a PCN reaction that required hospitalization: No Has patient had a PCN reaction occurring within the last 10 years: Yes If all of the above answers are "NO", then may proceed with Cephalosporin use.  . Sulfamethoxazole Hives and Nausea And Vomiting    REACTION: urticaria (hives) & weakness  . Sulfonamide Derivatives Hives and Nausea And Vomiting    Weakness  . Codeine Other (See Comments)    Dry mouth  . Penicillins Hives    Has patient had a PCN reaction causing immediate rash, facial/tongue/throat swelling, SOB or lightheadedness with hypotension: Yes Has patient had a PCN reaction causing severe rash involving mucus membranes or skin necrosis: No Has patient had a PCN reaction that required hospitalization: No Has patient had a PCN reaction occurring within the last 10 years: Yes If all of the above answers are "NO", then may proceed with Cephalosporin use.    ROS Review of Systems    Objective:     Physical Exam  BP (!) 152/70   Pulse 61   Temp 98.1 F (36.7 C) (Oral)   Ht 5\' 2"  (1.575 m)   Wt 200 lb 3.2 oz (90.8 kg)   SpO2 97%   BMI 36.62 kg/m  Wt Readings from Last 3  Encounters:  01/26/18 200 lb 3.2 oz (90.8 kg)  11/19/17 198 lb (89.8 kg)  11/11/17 199 lb (90.3 kg)   Lungs clear Cardiac RRR without g.  1/6 SEM. Abd benign Ext left arm normal strength and sensation.  EKG done - no acute changes.    Health Maintenance Due  Topic Date Due  . OPHTHALMOLOGY EXAM  05/28/2017  . INFLUENZA VACCINE  09/04/2017  . FOOT EXAM  01/10/2018    There are no preventive care reminders to display for this patient.  Lab Results  Component Value Date   TSH 2.780 05/23/2016   Lab Results  Component Value Date   WBC 6.3 11/11/2017   HGB 11.7 (L) 11/11/2017   HCT 38.5 11/11/2017   MCV 81.2 11/11/2017   PLT 344 11/11/2017   Lab Results  Component Value Date   NA 142 11/11/2017   K 4.1 11/11/2017   CO2 28 11/11/2017   GLUCOSE 142 (H) 11/11/2017   BUN 15 11/11/2017   CREATININE 1.01 (H) 11/11/2017   BILITOT 0.3 03/17/2015   ALKPHOS 55 03/17/2015   AST 20 03/17/2015   ALT 14 03/17/2015   PROT 7.3 03/17/2015   ALBUMIN 3.6 03/17/2015   CALCIUM 9.5 11/11/2017   ANIONGAP 12 11/11/2017   Lab Results  Component Value Date   CHOL 164 09/05/2014   Lab Results  Component Value Date   HDL 48 09/05/2014   Lab Results  Component Value Date   LDLCALC 89 09/05/2014   Lab Results  Component Value Date   TRIG 135 09/05/2014   Lab Results  Component Value Date   CHOLHDL 3.4 09/05/2014   Lab Results  Component Value Date   HGBA1C 6.3 11/19/2017      Assessment & Plan:   Problem List Items Addressed This Visit    Chest pain - Primary   Relevant Medications   isosorbide mononitrate (IMDUR) 30 MG 24 hr tablet   Other Relevant Orders   EKG 12-Lead (Completed)      Meds ordered this encounter  Medications  . isosorbide mononitrate (IMDUR) 30 MG 24 hr tablet      Sig: Take 1 tablet (30 mg total) by mouth daily.    Dispense:  30 tablet    Refill:  6    Follow-up: No follow-ups on file.    Zenia Resides, MD

## 2018-01-26 NOTE — Assessment & Plan Note (Signed)
Likely musculoskeletal.   Unlikely to be due to CVA given fluctuating weakness and normal strength now. Could be an atypical presentation of CAD - but unlikely.

## 2018-01-26 NOTE — Patient Instructions (Signed)
I am not sure about your chest pain and left arm weakness.  It worries, but not enough to put you in the hospital today. If you develop chest pain that comes and stays, please to the emergency room. I am going to treat this as if it is angina - a pain coming from your heart. Stay on the aspirin and cholerterol medicine (pravastatin) every day. Stop the metoprolol I sent in a new medicine to the pharmacy  On your way out, make an appointment to see me on Thursday.  I want to know if the new medicine helps and if your having spells.

## 2018-01-29 ENCOUNTER — Ambulatory Visit (INDEPENDENT_AMBULATORY_CARE_PROVIDER_SITE_OTHER): Payer: Medicare Other | Admitting: Family Medicine

## 2018-01-29 ENCOUNTER — Other Ambulatory Visit: Payer: Self-pay

## 2018-01-29 ENCOUNTER — Encounter: Payer: Self-pay | Admitting: Family Medicine

## 2018-01-29 DIAGNOSIS — R29898 Other symptoms and signs involving the musculoskeletal system: Secondary | ICD-10-CM | POA: Diagnosis not present

## 2018-01-29 DIAGNOSIS — R079 Chest pain, unspecified: Secondary | ICD-10-CM

## 2018-01-29 DIAGNOSIS — Z23 Encounter for immunization: Secondary | ICD-10-CM | POA: Diagnosis not present

## 2018-01-29 MED ORDER — ISOSORBIDE MONONITRATE ER 30 MG PO TB24: 30.0000 mg | ORAL_TABLET | Freq: Every day | ORAL | 3 refills | Status: DC

## 2018-01-29 NOTE — Patient Instructions (Signed)
I am delighted that you are feeling better and that the new medicine seems to agree with you. My nurse will give you a flu shot today.

## 2018-01-29 NOTE — Assessment & Plan Note (Signed)
Resolved (with imdur or coincidentally.)

## 2018-01-29 NOTE — Progress Notes (Signed)
Established Patient Office Visit  Subjective:  Patient ID: Amber [name], female    DOB: [birth year]  Age: 77 y.o. MRN: [id number]  CC:  Chief Complaint  Patient presents with  . follow up chest pain, arm pain    HPI Amber [name] presents for Chest and arm pain.  Patient feels great.  Has stopped her metoprolol.  No chest or arm pain since beginning imdur.  Takes imdur and night and states she has slept great the last two nights.    Needs a flu shot. Is willing to get today.    States that she had an eye exam in the past six months.  Past Medical History:  Diagnosis Date  . Anemia   . Arthritis   . Back pain   . Blood transfusion without reported diagnosis   . CLL (chronic lymphocytic leukemia) ([place]) 09/21/2015  . Dizziness 12/27/2014  . H/O echocardiogram    a. 2015: echo showing a preserved EF of 60-65%, Grade 1 DD and mild MR.  Marland Kitchen History of cardiac cath    a. normal cors by cath in 03/2010  . Hyperlipidemia   . Palpitations     Past Surgical History:  Procedure Laterality Date  . ABDOMINAL HYSTERECTOMY    . COLON SURGERY      Family History  Problem Relation Age of Onset  . Pancreatic cancer Sister        3 sisters died of it  . Heart attack Father 79       Massive MI  . Diabetes Mother   . Cancer Mother   . Hypertension Son   . Congestive Heart Failure Son   . Diabetes Son   . Hypertension Daughter     Social History   Socioeconomic History  . Marital status: Widowed    Spouse name: Not on file  . Number of children: 5  . Years of education: Not on file  . Highest education level: Not on file  Occupational History  . Not on file  Social Needs  . Financial resource strain: Not on file  . Food insecurity:    Worry: Not on file    Inability: Not on file  . Transportation needs:    Medical: Not on file    Non-medical: Not on file  Tobacco Use  . Smoking status: Never Smoker  . Smokeless tobacco: Former Network engineer and Sexual  Activity  . Alcohol use: No  . Drug use: No  . Sexual activity: Never  Lifestyle  . Physical activity:    Days per week: Not on file    Minutes per session: Not on file  . Stress: Not on file  Relationships  . Social connections:    Talks on phone: Not on file    Gets together: Not on file    Attends religious service: Not on file    Active member of club or organization: Not on file    Attends meetings of clubs or organizations: Not on file    Relationship status: Not on file  . Intimate partner violence:    Fear of current or ex partner: Not on file    Emotionally abused: Not on file    Physically abused: Not on file    Forced sexual activity: Not on file  Other Topics Concern  . 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  5 children, lives alone.   Pt exercises by walking every other day.          Outpatient Medications Prior to Visit  Medication Sig Dispense Refill  . acetaminophen (TYLENOL) 500 MG tablet Take 1 tablet (500 mg total) by mouth every 6 (six) hours as needed. (Patient taking differently: Take 500 mg by mouth every 6 (six) hours as needed for mild pain. ) 30 tablet 0  . aspirin EC 81 MG tablet Take 81 mg by mouth daily.    . diclofenac sodium (VOLTAREN) 1 % GEL Apply 2 g topically 4 (four) times daily. (Patient taking differently: Apply 2 g topically daily as needed (pain and inflammation). ) 1 Tube 3  . losartan-hydrochlorothiazide (HYZAAR) 100-12.5 MG tablet Take 1 tablet by mouth daily. 90 tablet 4  . metFORMIN (GLUCOPHAGE) 500 MG tablet Take 1 tablet (500 mg total) by mouth daily. 90 tablet 3  . Multiple Vitamins-Minerals (WOMENS ONE DAILY) TABS Take 1 tablet by mouth daily.    . pravastatin (PRAVACHOL) 40 MG tablet Take 1 tablet (40 mg total) by mouth daily. 90 tablet 3  . timolol (BETIMOL) 0.5 % ophthalmic solution Place 1 drop into the right eye 2 (two)  times daily. 10 mL 12  . isosorbide mononitrate (IMDUR) 30 MG 24 hr tablet Take 1 tablet (30 mg total) by mouth daily. 30 tablet 6   No facility-administered medications prior to visit.     Allergies  Allergen Reactions  . Amoxicillin Itching and Other (See Comments)    REACTION: arrythmia Has patient had a PCN reaction causing immediate rash, facial/tongue/throat swelling, SOB or lightheadedness with hypotension: Yes Has patient had a PCN reaction causing severe rash involving mucus membranes or skin necrosis: No Has patient had a PCN reaction that required hospitalization: No Has patient had a PCN reaction occurring within the last 10 years: Yes If all of the above answers are "NO", then may proceed with Cephalosporin use.  . Sulfamethoxazole Hives and Nausea And Vomiting    REACTION: urticaria (hives) & weakness  . Sulfonamide Derivatives Hives and Nausea And Vomiting    Weakness  . Codeine Other (See Comments)    Dry mouth  . Penicillins Hives    Has patient had a PCN reaction causing immediate rash, facial/tongue/throat swelling, SOB or lightheadedness with hypotension: Yes Has patient had a PCN reaction causing severe rash involving mucus membranes or skin necrosis: No Has patient had a PCN reaction that required hospitalization: No Has patient had a PCN reaction occurring within the last 10 years: Yes If all of the above answers are "NO", then may proceed with Cephalosporin use.    ROS Review of Systems    Objective:    Physical Exam  BP 122/60   Pulse 67   Temp 98.1 F (36.7 C) (Oral)   Wt 199 lb 12.8 oz (90.6 kg)   SpO2 95%   BMI 36.54 kg/m  Wt Readings from Last 3 Encounters:  01/29/18 199 lb 12.8 oz (90.6 kg)  01/26/18 200 lb 3.2 oz (90.8 kg)  11/19/17 198 lb (89.8 kg)   Lungs clear Cardiac RRR without m or g Left arm, normal strength and sensation.  Health Maintenance Due  Topic Date Due  . OPHTHALMOLOGY EXAM  05/28/2017  . INFLUENZA VACCINE   09/04/2017  . FOOT EXAM  01/10/2018    There are no preventive care reminders to display for this patient.  Lab Results  Component Value Date   TSH  2.780 05/23/2016   Lab Results  Component Value Date   WBC 6.3 11/11/2017   HGB 11.7 (L) 11/11/2017   HCT 38.5 11/11/2017   MCV 81.2 11/11/2017   PLT 344 11/11/2017   Lab Results  Component Value Date   NA 142 11/11/2017   K 4.1 11/11/2017   CO2 28 11/11/2017   GLUCOSE 142 (H) 11/11/2017   BUN 15 11/11/2017   CREATININE 1.01 (H) 11/11/2017   BILITOT 0.3 03/17/2015   ALKPHOS 55 03/17/2015   AST 20 03/17/2015   ALT 14 03/17/2015   PROT 7.3 03/17/2015   ALBUMIN 3.6 03/17/2015   CALCIUM 9.5 11/11/2017   ANIONGAP 12 11/11/2017   Lab Results  Component Value Date   CHOL 164 09/05/2014   Lab Results  Component Value Date   HDL 48 09/05/2014   Lab Results  Component Value Date   LDLCALC 89 09/05/2014   Lab Results  Component Value Date   TRIG 135 09/05/2014   Lab Results  Component Value Date   CHOLHDL 3.4 09/05/2014   Lab Results  Component Value Date   HGBA1C 6.3 11/19/2017      Assessment & Plan:   Problem List Items Addressed This Visit    Chest pain   Relevant Medications   isosorbide mononitrate (IMDUR) 30 MG 24 hr tablet    Other Visit Diagnoses    Need for immunization against influenza       Relevant Orders   Flu Vaccine QUAD 36+ mos IM (Completed)      Meds ordered this encounter  Medications  . isosorbide mononitrate (IMDUR) 30 MG 24 hr tablet    Sig: Take 1 tablet (30 mg total) by mouth daily.    Dispense:  90 tablet    Refill:  3    Follow-up: No follow-ups on file.    Zenia Resides, MD

## 2018-02-14 ENCOUNTER — Emergency Department (HOSPITAL_COMMUNITY): Payer: Medicare Other

## 2018-02-14 ENCOUNTER — Encounter (HOSPITAL_COMMUNITY): Payer: Self-pay | Admitting: Emergency Medicine

## 2018-02-14 ENCOUNTER — Emergency Department (HOSPITAL_COMMUNITY)
Admission: EM | Admit: 2018-02-14 | Discharge: 2018-02-14 | Disposition: A | Discharge status: Home / Self Care | Payer: Medicare Other | Attending: Emergency Medicine | Admitting: Emergency Medicine

## 2018-02-14 DIAGNOSIS — Z7982 Long term (current) use of aspirin: Secondary | ICD-10-CM | POA: Insufficient documentation

## 2018-02-14 DIAGNOSIS — Z7984 Long term (current) use of oral hypoglycemic drugs: Secondary | ICD-10-CM | POA: Diagnosis not present

## 2018-02-14 DIAGNOSIS — K219 Gastro-esophageal reflux disease without esophagitis: Secondary | ICD-10-CM | POA: Insufficient documentation

## 2018-02-14 DIAGNOSIS — E1169 Type 2 diabetes mellitus with other specified complication: Secondary | ICD-10-CM | POA: Insufficient documentation

## 2018-02-14 DIAGNOSIS — R079 Chest pain, unspecified: Secondary | ICD-10-CM | POA: Diagnosis present

## 2018-02-14 DIAGNOSIS — Z79899 Other long term (current) drug therapy: Secondary | ICD-10-CM | POA: Diagnosis not present

## 2018-02-14 DIAGNOSIS — I10 Essential (primary) hypertension: Secondary | ICD-10-CM | POA: Insufficient documentation

## 2018-02-14 DIAGNOSIS — C9110 Chronic lymphocytic leukemia of B-cell type not having achieved remission: Secondary | ICD-10-CM | POA: Insufficient documentation

## 2018-02-14 DIAGNOSIS — E785 Hyperlipidemia, unspecified: Secondary | ICD-10-CM | POA: Insufficient documentation

## 2018-02-14 HISTORY — DX: Type 2 diabetes mellitus without complications: E11.9

## 2018-02-14 LAB — URINALYSIS, ROUTINE W REFLEX MICROSCOPIC
Bilirubin Urine: NEGATIVE
GLUCOSE, UA: NEGATIVE mg/dL
Hgb urine dipstick: NEGATIVE
KETONES UR: NEGATIVE mg/dL
LEUKOCYTES UA: NEGATIVE
Nitrite: NEGATIVE
PH: 6 (ref 5.0–8.0)
Protein, ur: NEGATIVE mg/dL
Specific Gravity, Urine: 1.012 (ref 1.005–1.030)

## 2018-02-14 LAB — COMPREHENSIVE METABOLIC PANEL
ALBUMIN: 4.1 g/dL (ref 3.5–5.0)
ALT: 14 U/L (ref 0–44)
AST: 20 U/L (ref 15–41)
Alkaline Phosphatase: 49 U/L (ref 38–126)
Anion gap: 9 (ref 5–15)
BUN: 14 mg/dL (ref 8–23)
CHLORIDE: 104 mmol/L (ref 98–111)
CO2: 29 mmol/L (ref 22–32)
CREATININE: 0.92 mg/dL (ref 0.44–1.00)
Calcium: 9.2 mg/dL (ref 8.9–10.3)
GFR calc Af Amer: >60 mL/min (ref >60)
GFR calc non Af Amer: >60 mL/min (ref >60)
Glucose, Bld: 128 mg/dL — ABNORMAL HIGH (ref 70–99)
Potassium: 3.4 mmol/L — ABNORMAL LOW (ref 3.5–5.1)
SODIUM: 142 mmol/L (ref 135–145)
Total Bilirubin: 0.7 mg/dL (ref 0.3–1.2)
Total Protein: 8.1 g/dL (ref 6.5–8.1)

## 2018-02-14 LAB — CBC
HEMATOCRIT: 37.6 % (ref 36.0–46.0)
HEMOGLOBIN: 11.1 g/dL — AB (ref 12.0–15.0)
MCH: 24.9 pg — ABNORMAL LOW (ref 26.0–34.0)
MCHC: 29.5 g/dL — AB (ref 30.0–36.0)
MCV: 84.3 fL (ref 80.0–100.0)
NRBC: 0 % (ref 0.0–0.2)
Platelets: 317 10*3/uL (ref 150–400)
RBC: 4.46 MIL/uL (ref 3.87–5.11)
RDW: 14.6 % (ref 11.5–15.5)
WBC: 7.5 10*3/uL (ref 4.0–10.5)

## 2018-02-14 LAB — I-STAT TROPONIN, ED: Troponin i, poc: 0 ng/mL (ref 0.00–0.08)

## 2018-02-14 LAB — LIPASE, BLOOD: LIPASE: 34 U/L (ref 11–51)

## 2018-02-14 MED ORDER — ALUM & MAG HYDROXIDE-SIMETH 200-200-20 MG/5ML PO SUSP
30.0000 mL | Freq: Once | ORAL | Status: AC
  Administered 2018-02-14: 30 mL via ORAL
  Filled 2018-02-14: qty 30

## 2018-02-14 MED ORDER — PANTOPRAZOLE SODIUM 20 MG PO TBEC: 20.0000 mg | DELAYED_RELEASE_TABLET | Freq: Every day | ORAL | 0 refills | Status: DC

## 2018-02-14 NOTE — Discharge Instructions (Addendum)
Take Maalox, 2 tablespoons before meals and at bedtime.  Start the prescription that was sent to your pharmacy to treat an acid condition.  See your primary care doctor for checkup in 1 or 2 weeks.

## 2018-02-14 NOTE — ED Notes (Signed)
Pt able to tolerate PO fluids at this time.

## 2018-02-14 NOTE — ED Triage Notes (Signed)
Pt c/o generalized chest pains and abd pains that has been constant for couple days. Reports nausea but denies vomiting.

## 2018-02-14 NOTE — ED Provider Notes (Signed)
[facility] DEPT Provider Note   CSN: [id number] Arrival date & time: 02/14/18  0855     History   Chief Complaint Chief Complaint  Patient presents with  . Chest Pain  . Abdominal Pain    HPI Amber [name] is a 78 y.o. female.  HPI  She presents for evaluation of chest discomfort described as a burning sensation present for several weeks, on and off.  In the last 2 days it has been more constant.  There is been no associated anorexia, nausea or vomiting.  She saw her PCP for the same difficulty and was treated symptomatically.  He denies shortness of breath, weakness, paresthesia, fever or chills.  There are no other known modifying factors.   Past Medical History:  Diagnosis Date  . Anemia   . Arthritis   . Back pain   . Blood transfusion without reported diagnosis   . CLL (chronic lymphocytic leukemia) ([place]) 09/21/2015  . Diabetes mellitus without complication ([place])   . Dizziness 12/27/2014  . H/O echocardiogram    a. 2015: echo showing a preserved EF of 60-65%, Grade 1 DD and mild MR.  Marland Kitchen History of cardiac cath    a. normal cors by cath in 03/2010  . Hyperlipidemia   . Hypertension   . Palpitations     Patient Active Problem List   Diagnosis Date Noted  . Chest pain 01/26/2018  . Bradycardia 01/26/2018  . Stress at home 11/19/2017  . Lower extremity edema 09/30/2016  . Swelling of lower extremity 09/16/2016  . Right hip pain 03/19/2016  . Microcytic anemia 10/05/2015  . Meralgia paraesthetica 10/04/2015  . Atypical lymphocytosis 09/21/2015  . Left arm weakness 08/18/2015  . Greater trochanteric bursitis of right hip 07/06/2013  . Heart palpitations 05/01/2012  . Chronic sinusitis 09/02/2011  . GERD 06/05/2006  . Diabetes mellitus without complication ([place]) 70/35/0093  . Hyperlipidemia associated with type 2 diabetes mellitus ([place]) 04/03/2006  . OBESITY, NOS 04/03/2006  . Iron deficiency anemia 04/03/2006  .  HYPERTENSION, BENIGN SYSTEMIC 04/03/2006  . MITRAL VALVE PROLAPSE 04/03/2006  . OSTEOARTHRITIS, MULTI SITES 04/03/2006  . Proteinuria 04/03/2006    Past Surgical History:  Procedure Laterality Date  . ABDOMINAL HYSTERECTOMY    . COLON SURGERY       OB History   No obstetric history on file.      Home Medications    Prior to Admission medications   Medication Sig Start Date End Date Taking? Authorizing Provider  acetaminophen (TYLENOL) 500 MG tablet Take 1 tablet (500 mg total) by mouth every 6 (six) hours as needed. 03/18/15  Yes Antonietta Breach, PA-C  aspirin EC 81 MG tablet Take 81 mg by mouth daily.   Yes [provider]  isosorbide mononitrate (IMDUR) 30 MG 24 hr tablet Take 1 tablet (30 mg total) by mouth daily. 01/29/18  Yes Hensel, Jamal Collin, MD  losartan-hydrochlorothiazide (HYZAAR) 100-12.5 MG tablet Take 1 tablet by mouth daily. 01/10/17  Yes Eloise Levels, MD  metFORMIN (GLUCOPHAGE) 500 MG tablet Take 1 tablet (500 mg total) by mouth daily. 12/22/17  Yes Sherene Sires, DO  Multiple Vitamins-Minerals (WOMENS ONE DAILY) TABS Take 1 tablet by mouth daily.   Yes [provider]  pravastatin (PRAVACHOL) 40 MG tablet Take 1 tablet (40 mg total) by mouth daily. 01/10/17  Yes Eloise Levels, MD  timolol (BETIMOL) 0.5 % ophthalmic solution Place 1 drop into the right eye 2 (two) times daily. 04/12/13  Yes Gerda Diss, DO  diclofenac sodium (VOLTAREN) 1 % GEL Apply 2 g topically 4 (four) times daily. Patient not taking: Reported on 02/14/2018 03/21/17   Eloise Levels, MD  pantoprazole (PROTONIX) 20 MG tablet Take 1 tablet (20 mg total) by mouth daily. 02/14/18   Daleen Bo, MD    Family History Family History  Problem Relation Age of Onset  . Pancreatic cancer Sister        3 sisters died of it  . Heart attack Father 37       Massive MI  . Diabetes Mother   . Cancer Mother   . Hypertension Son   . Congestive Heart Failure Son   . Diabetes Son   .  Hypertension Daughter     Social History Social History   Tobacco Use  . Smoking status: Never Smoker  . Smokeless tobacco: Former Network engineer Use Topics  . Alcohol use: No  . Drug use: No     Allergies   Amoxicillin; Sulfamethoxazole; Sulfonamide derivatives; Codeine; and Penicillins   Review of Systems Review of Systems  All other systems reviewed and are negative.    Physical Exam Updated Vital Signs BP 102/63   Pulse 61   Temp 98.5 F (36.9 C) (Oral)   Resp 16   SpO2 98%   Physical Exam Vitals signs and nursing note reviewed.  Constitutional:      General: She is not in acute distress.    Appearance: She is well-developed. She is obese. She is not ill-appearing, toxic-appearing or diaphoretic.  HENT:     Head: Normocephalic and atraumatic.     Right Ear: External ear normal.     Left Ear: External ear normal.  Eyes:     Conjunctiva/sclera: Conjunctivae normal.     Pupils: Pupils are equal, round, and reactive to light.  Neck:     Musculoskeletal: Normal range of motion and neck supple.     Trachea: Phonation normal.  Cardiovascular:     Rate and Rhythm: Normal rate and regular rhythm.     Heart sounds: Normal heart sounds.  Pulmonary:     Effort: Pulmonary effort is normal. No respiratory distress.     Breath sounds: Normal breath sounds. No stridor.  Abdominal:     General: There is no distension.     Palpations: Abdomen is soft. There is no mass.     Tenderness: There is abdominal tenderness (Epigastric, mild). There is no guarding.     Hernia: No hernia is present.  Musculoskeletal: Normal range of motion.  Skin:    General: Skin is warm and dry.  Neurological:     Mental Status: She is alert and oriented to person, place, and time.     Cranial Nerves: No cranial nerve deficit.     Sensory: No sensory deficit.     Motor: No abnormal muscle tone.     Coordination: Coordination normal.  Psychiatric:        Mood and Affect: Mood normal.         Behavior: Behavior normal.        Thought Content: Thought content normal.        Judgment: Judgment normal.      ED Treatments / Results  Labs (all labs ordered are listed, but only abnormal results are displayed) Labs Reviewed  COMPREHENSIVE METABOLIC PANEL - Abnormal; Notable for the following components:      Result Value   Potassium 3.4 (*)  Glucose, Bld 128 (*)    All other components within normal limits  CBC - Abnormal; Notable for the following components:   Hemoglobin 11.1 (*)    MCH 24.9 (*)    MCHC 29.5 (*)    All other components within normal limits  LIPASE, BLOOD  URINALYSIS, ROUTINE W REFLEX MICROSCOPIC  I-STAT TROPONIN, ED    EKG EKG Interpretation  Date/Time:  Saturday February 14 2018 09:09:10 EST Ventricular Rate:  66 PR Interval:    QRS Duration: 99 QT Interval:  417 QTC Calculation: 437 R Axis:   -56 Text Interpretation:  Sinus rhythm Consider left atrial enlargement LAD, consider left anterior fascicular block Baseline wander in lead(s) V6 since last tracing no significant change Confirmed by Daleen Bo [phone]) on 02/14/2018 9:25:36 AM   Radiology Dg Chest 2 View  Result Date: 02/14/2018 CLINICAL DATA:  Chest pain EXAM: CHEST - 2 VIEW COMPARISON:  06/16/2016 chest radiograph. FINDINGS: Stable cardiomediastinal silhouette with borderline mild cardiomegaly. No pneumothorax. No pleural effusion. Lungs appear clear, with no acute consolidative airspace disease and no pulmonary edema. IMPRESSION: Stable borderline mild cardiomegaly without pulmonary edema. No active pulmonary disease. Electronically Signed   By: Ilona Sorrel M.D.   On: 02/14/2018 10:04    Procedures Procedures (including critical care time)  Medications Ordered in ED Medications  alum & mag hydroxide-simeth (MAALOX/MYLANTA) 200-200-20 MG/5ML suspension 30 mL (30 mLs Oral Given 02/14/18 1101)     Initial Impression / Assessment and Plan / ED Course  I have reviewed the  triage vital signs and the nursing notes.  Pertinent labs & imaging results that were available during my care of the patient were reviewed by me and considered in my medical decision making (see chart for details).  Clinical Course as of Feb 14 1233  Sat Feb 14, 2018  1229 Normal  I-stat troponin, ED [EW]  1229 Normal  Lipase, blood [EW]  1229 Normal except hemoglobin low 11.1   [EW]  1229 Normal except potassium low, glucose high  Comprehensive metabolic panel(!) [EW]  2774 Normal  Urinalysis, Routine w reflex microscopic [EW]    Clinical Course User Index [EW] Daleen Bo, MD     Patient Vitals for the past 24 hrs:  BP Temp Temp src Pulse Resp SpO2  02/14/18 1130 102/63 - - 61 16 98 %  02/14/18 0909 (!) 177/81 98.5 F (36.9 C) Oral 66 20 96 %    12:29 PM Reevaluation with update and discussion. After initial assessment and treatment, an updated evaluation reveals she states that the Maalox resolved her burning discomfort.  Findings discussed with the patient and all questions were answered. Daleen Bo   Medical Decision Making: Evaluation is consistent with gastritis versus GERD, with improvement with this since.  ACS, PE or pneumonia.  Patient stable for discharge with outpatient management.  CRITICAL CARE-no Performed by: Daleen Bo  Nursing Notes Reviewed/ Care Coordinated Applicable Imaging Reviewed Interpretation of Laboratory Data incorporated into ED treatment  The patient appears reasonably screened and/or stabilized for discharge and I doubt any other medical condition or other [facility] requiring further screening, evaluation, or treatment in the ED at this time prior to discharge.  Plan: Home Medications-Maalox AC at bedtime, continued usual; Home Treatments-rest; return here if the recommended treatment, does not improve the symptoms; Recommended follow up-PCP, PRN   Final Clinical Impressions(s) / ED Diagnoses   Final diagnoses:  Gastroesophageal  reflux disease, esophagitis presence not specified    ED Discharge Orders  Ordered    pantoprazole (PROTONIX) 20 MG tablet  Daily     02/14/18 1233           Daleen Bo, MD 02/14/18 1235

## 2018-02-18 ENCOUNTER — Encounter: Payer: Self-pay | Admitting: Family Medicine

## 2018-02-18 ENCOUNTER — Telehealth: Payer: Self-pay | Admitting: *Deleted

## 2018-02-18 ENCOUNTER — Ambulatory Visit (INDEPENDENT_AMBULATORY_CARE_PROVIDER_SITE_OTHER): Payer: Medicare Other | Admitting: Family Medicine

## 2018-02-18 VITALS — BP 142/82 | HR 72 | Temp 98.3°F | Ht 62.0 in | Wt 200.8 lb

## 2018-02-18 DIAGNOSIS — M62838 Other muscle spasm: Secondary | ICD-10-CM | POA: Diagnosis not present

## 2018-02-18 DIAGNOSIS — E119 Type 2 diabetes mellitus without complications: Secondary | ICD-10-CM

## 2018-02-18 LAB — POCT GLYCOSYLATED HEMOGLOBIN (HGB A1C): HbA1c, POC (controlled diabetic range): 6.5 % (ref 0.0–7.0)

## 2018-02-18 MED ORDER — DICLOFENAC SODIUM 1 % TD GEL: 2.0000 g | Freq: Four times a day (QID) | TRANSDERMAL | 3 refills | Status: DC

## 2018-02-18 MED ORDER — BACLOFEN 10 MG PO TABS: 10.0000 mg | ORAL_TABLET | Freq: Three times a day (TID) | ORAL | 0 refills | Status: DC

## 2018-02-18 NOTE — Patient Instructions (Addendum)
It was good to meet you today.  Take the Baclofen one pill in the AM and one pill in the PM to help with your neck.  Use the Voltaren gel on the area to help with pain relief.    Keep using heat.  Massage can be helpful as well.    You can continue to use Tylenol for pain relief as well.  If you have any trouble seeing or worsening headache or worsening pain despite treatment make sure to come back and see [name].

## 2018-02-18 NOTE — Assessment & Plan Note (Signed)
A1C obtained today.  FU with PCP.

## 2018-02-18 NOTE — Progress Notes (Signed)
Subjective:    Amber [name] is a 78 y.o. female who presents to [facility] today for neck pain:  1.  Neck pain:  Acute issue for patient.  Present for the past 4 to 5 days.  No eliciting factors that she knows of.  No lifting heavy objects.  No trouble sleeping.  Has had sharp stabbing pain bilateral neck but worse on the right.  Worse when she tries to turn her head to the right and to the left.  Also worse if she lays down.  She had no headaches.  She had trouble with chest pain last month and earlier last week but none this week.  No arm weakness.  No radiation to her arms or hands.  No hand weakness.  No falls.  No injuries.  Describes a 6 out of 10 in severity.  Pain is relieved by Tylenol.  When the Tylenol wears off the pain returns.  ROS as above per HPI.    The following portions of the patient's history were reviewed and updated as appropriate: allergies, current medications, past medical history, family and social history, and problem list. Patient is a nonsmoker.    PMH reviewed.  Past Medical History:  Diagnosis Date  . Anemia   . Arthritis   . Back pain   . Blood transfusion without reported diagnosis   . CLL (chronic lymphocytic leukemia) ([place]) 09/21/2015  . Diabetes mellitus without complication ([place])   . Dizziness 12/27/2014  . H/O echocardiogram    a. 2015: echo showing a preserved EF of 60-65%, Grade 1 DD and mild MR.  Marland Kitchen History of cardiac cath    a. normal cors by cath in 03/2010  . Hyperlipidemia   . Hypertension   . Palpitations    Past Surgical History:  Procedure Laterality Date  . ABDOMINAL HYSTERECTOMY    . COLON SURGERY      Medications reviewed. Current Outpatient Medications  Medication Sig Dispense Refill  . acetaminophen (TYLENOL) 500 MG tablet Take 1 tablet (500 mg total) by mouth every 6 (six) hours as needed. 30 tablet 0  . aspirin EC 81 MG tablet Take 81 mg by mouth daily.    . diclofenac sodium (VOLTAREN) 1 % GEL Apply 2 g topically 4  (four) times daily. (Patient not taking: Reported on 02/14/2018) 1 Tube 3  . isosorbide mononitrate (IMDUR) 30 MG 24 hr tablet Take 1 tablet (30 mg total) by mouth daily. 90 tablet 3  . losartan-hydrochlorothiazide (HYZAAR) 100-12.5 MG tablet Take 1 tablet by mouth daily. 90 tablet 4  . metFORMIN (GLUCOPHAGE) 500 MG tablet Take 1 tablet (500 mg total) by mouth daily. 90 tablet 3  . Multiple Vitamins-Minerals (WOMENS ONE DAILY) TABS Take 1 tablet by mouth daily.    . pantoprazole (PROTONIX) 20 MG tablet Take 1 tablet (20 mg total) by mouth daily. 30 tablet 0  . pravastatin (PRAVACHOL) 40 MG tablet Take 1 tablet (40 mg total) by mouth daily. 90 tablet 3  . timolol (BETIMOL) 0.5 % ophthalmic solution Place 1 drop into the right eye 2 (two) times daily. 10 mL 12   No current facility-administered medications for this visit.      Objective:   Physical Exam BP (!) 142/82   Pulse 72   Temp 98.3 F (36.8 C) (Oral)   Ht 5\' 2"  (1.575 m)   Wt 200 lb 12.8 oz (91.1 kg)   SpO2 97%   BMI 36.73 kg/m  Gen:  Alert, cooperative patient who  appears stated age in no acute distress.  Vital signs reviewed. HEENT: EOMI,  MMM Neck: No lymphadenopathy.  No neck stiffness. Musculoskeletal: Decreased lateral motion past about 45 degrees left and right limited by pain.  She can force it beyond this.  More pain with neck extension.  No pain with neck flexion. Neurovascular intact bilateral arms.  Sensation strength bilateral upper extremities is 5 out of 5.  Strength sensation is 5/5 bilateral lower extremities.  Impression/plan: 1.  Neck pain: -No red flags by exam or history. -Treating a cervical neck spasm. -See AVS for full details.  Treated with low-dose baclofen, heat, massage.  Continue Tylenol for pain relief. -Morning precautions provided.

## 2018-02-18 NOTE — Telephone Encounter (Signed)
Pt calls c/o of a stiff neck.  Tylenol helps but once tylenol wears off her neck hurts again.  Pt states it has been like this for one week.  Appt made for this afternoon. Anamae Rochelle, Salome Spotted, CMA

## 2018-03-13 ENCOUNTER — Ambulatory Visit: Payer: Medicare Other | Admitting: Family Medicine

## 2018-03-13 ENCOUNTER — Other Ambulatory Visit: Payer: Self-pay

## 2018-03-13 VITALS — BP 150/78 | HR 70 | Temp 98.2°F | Wt 200.0 lb

## 2018-03-13 DIAGNOSIS — R0989 Other specified symptoms and signs involving the circulatory and respiratory systems: Secondary | ICD-10-CM | POA: Insufficient documentation

## 2018-03-13 NOTE — Progress Notes (Signed)
    Subjective:  Amber [name] is a 78 y.o. female who presents to the [facility] today with a chief complaint of upper respiratory symptoms for 4 days.   HPI: Patient with 4-day history of upper respiratory symptoms potentially consistent with minor flu.  She did have muscle aches for the first few days which have since resolved.  She has had a cough which is occasionally productive.  She does not think that she has been having fever symptoms.  Appetite is still been fine she has not had any diarrhea or bowel symptoms or urinary symptoms.  She has not had any vomiting or any significant nausea.  No loss of consciousness or dizziness.  She has been feeling a little more tired but does believe that she can accomplish her normal daily activities with a little extra effort.   Objective:  Physical Exam: BP (!) 150/78   Pulse 70   Temp 98.2 F (36.8 C) (Oral)   Wt 200 lb (90.7 kg)   SpO2 97%   BMI 36.58 kg/m   Gen: Ill-appearing but not toxic, she is able to walk under her own power without assistance CV: RRR with no murmurs appreciated Pulm: NWOB, some minor congestion bilaterally at the bases, no stridor or indication of airway trouble Skin: warm, dry Neuro: grossly normal, moves all extremities Psych: Normal affect and thought content  No results found for this or any previous visit (from the past 72 hour(s)).   Assessment/Plan:  Chest congestion Patient with 4-day history of upper respiratory symptoms potentially consistent with minor flu.  She did have muscle aches for the first few days which have since resolved.  She has had a cough which is occasionally productive.  She does not think that she has been having fever symptoms.  Appetite is still been fine she has not had any diarrhea or bowel symptoms or urinary symptoms.  She has not had any vomiting or any significant nausea.  No loss of consciousness or dizziness.  She has been feeling a little more tired but does believe that she  can accomplish her normal daily activities with a little extra effort.  We discussed that she is sick but had some shared decision making discussion over the risks and benefits of significant work-up for this at this time.  If this is flu she is past the treatment window for Tamiflu which has limited utility anyway.  Lung exam did show minor congestion but nothing significant enough to indicate large pneumonia.  Hospitalization at this point would really only offer monitoring while exposing her to significant increased risk of coinfection.  She should likely start to recover within a few days but in the chance that she does not we are ordering a chest x-ray to be done outpatient that she can get done at the hospital on Monday if she is still feeling ill then.  We are also scheduling an outpatient follow-up in our clinic with Dr. Reesa Chew so that someone can evaluate her.  She understands that she does not need to get the chest x-ray if she is starting to improve.  We went over significant symptoms that would indicate a need to go to the emergency department.  She agrees with plan   Sherene Sires, [place] - PGY2 03/13/2018 11:22 AM

## 2018-03-13 NOTE — Assessment & Plan Note (Signed)
Patient with 4-day history of upper respiratory symptoms potentially consistent with minor flu.  She did have muscle aches for the first few days which have since resolved.  She has had a cough which is occasionally productive.  She does not think that she has been having fever symptoms.  Appetite is still been fine she has not had any diarrhea or bowel symptoms or urinary symptoms.  She has not had any vomiting or any significant nausea.  No loss of consciousness or dizziness.  She has been feeling a little more tired but does believe that she can accomplish her normal daily activities with a little extra effort.  We discussed that she is sick but had some shared decision making discussion over the risks and benefits of significant work-up for this at this time.  If this is flu she is past the treatment window for Tamiflu which has limited utility anyway.  Lung exam did show minor congestion but nothing significant enough to indicate large pneumonia.  Hospitalization at this point would really only offer monitoring while exposing her to significant increased risk of coinfection.  She should likely start to recover within a few days but in the chance that she does not we are ordering a chest x-ray to be done outpatient that she can get done at the hospital on Monday if she is still feeling ill then.  We are also scheduling an outpatient follow-up in our clinic with Dr. Reesa Chew so that someone can evaluate her.  She understands that she does not need to get the chest x-ray if she is starting to improve.  We went over significant symptoms that would indicate a need to go to the emergency department.  She agrees with plan

## 2018-03-13 NOTE — Patient Instructions (Signed)
It was a pleasure to see you today! Thank you for choosing [facility] for your primary care. Amber [name] was seen for chest congestion for 3 days. Come back to the clinic for your appointment with Dr. Reesa Chew on Monday at 11 AM, and go to the emergency room if things get significantly worse before then.   Today we talked about your 4-day history of congestion, it does sound like you have an upper respiratory infection although if it is flu at this point we are out of the treatment window and so we went to focus on symptoms.  You can use over-the-counter Tylenol and saline nose spray to help deal with congestion as well as over-the-counter NyQuil or Robitussin for your cough symptoms.  We have called in a chest x-ray that you can take at the hospital if you are not feeling better by Monday just go to the front desk and tell them you have an order for an x-ray and they can get you to the radiology department.  This is something you only need to do if you are not feeling better by Monday if you are feeling better you do not go get the x-ray.    Please bring all your medications to every doctors visit   Sign up for My Chart to have easy access to your labs results, and communication with your Primary care physician.     Please check-out at the front desk before leaving the clinic.     Best,  Dr. Sherene Sires FAMILY MEDICINE RESIDENT - PGY2 03/13/2018 10:20 AM

## 2018-03-15 ENCOUNTER — Encounter (HOSPITAL_COMMUNITY): Payer: Self-pay

## 2018-03-15 ENCOUNTER — Emergency Department (HOSPITAL_COMMUNITY)
Admission: EM | Admit: 2018-03-15 | Discharge: 2018-03-15 | Disposition: A | Discharge status: Home / Self Care | Payer: Medicare Other | Attending: Emergency Medicine | Admitting: Emergency Medicine

## 2018-03-15 ENCOUNTER — Emergency Department (HOSPITAL_COMMUNITY): Payer: Medicare Other

## 2018-03-15 DIAGNOSIS — Z79899 Other long term (current) drug therapy: Secondary | ICD-10-CM | POA: Diagnosis not present

## 2018-03-15 DIAGNOSIS — B9789 Other viral agents as the cause of diseases classified elsewhere: Secondary | ICD-10-CM

## 2018-03-15 DIAGNOSIS — J069 Acute upper respiratory infection, unspecified: Secondary | ICD-10-CM

## 2018-03-15 DIAGNOSIS — E785 Hyperlipidemia, unspecified: Secondary | ICD-10-CM | POA: Diagnosis not present

## 2018-03-15 DIAGNOSIS — I10 Essential (primary) hypertension: Secondary | ICD-10-CM | POA: Diagnosis not present

## 2018-03-15 DIAGNOSIS — R0789 Other chest pain: Secondary | ICD-10-CM | POA: Diagnosis not present

## 2018-03-15 DIAGNOSIS — Z7982 Long term (current) use of aspirin: Secondary | ICD-10-CM | POA: Diagnosis not present

## 2018-03-15 DIAGNOSIS — E119 Type 2 diabetes mellitus without complications: Secondary | ICD-10-CM | POA: Insufficient documentation

## 2018-03-15 DIAGNOSIS — R0981 Nasal congestion: Secondary | ICD-10-CM | POA: Diagnosis present

## 2018-03-15 LAB — CBC
HCT: 35.8 % — ABNORMAL LOW (ref 36.0–46.0)
Hemoglobin: 10.9 g/dL — ABNORMAL LOW (ref 12.0–15.0)
MCH: 24.9 pg — ABNORMAL LOW (ref 26.0–34.0)
MCHC: 30.4 g/dL (ref 30.0–36.0)
MCV: 81.7 fL (ref 80.0–100.0)
Platelets: 297 10*3/uL (ref 150–400)
RBC: 4.38 MIL/uL (ref 3.87–5.11)
RDW: 14.6 % (ref 11.5–15.5)
WBC: 7.8 10*3/uL (ref 4.0–10.5)
nRBC: 0 % (ref 0.0–0.2)

## 2018-03-15 LAB — BASIC METABOLIC PANEL
Anion gap: 12 (ref 5–15)
BUN: 20 mg/dL (ref 8–23)
CO2: 25 mmol/L (ref 22–32)
Calcium: 9.3 mg/dL (ref 8.9–10.3)
Chloride: 104 mmol/L (ref 98–111)
Creatinine, Ser: 1.32 mg/dL — ABNORMAL HIGH (ref 0.44–1.00)
GFR calc Af Amer: 45 mL/min — ABNORMAL LOW (ref >60)
GFR, EST NON AFRICAN AMERICAN: 39 mL/min — AB (ref >60)
Glucose, Bld: 91 mg/dL (ref 70–99)
Potassium: 3.7 mmol/L (ref 3.5–5.1)
Sodium: 141 mmol/L (ref 135–145)

## 2018-03-15 LAB — I-STAT TROPONIN, ED: Troponin i, poc: 0.01 ng/mL (ref 0.00–0.08)

## 2018-03-15 MED ORDER — DEXTROMETHORPHAN-GUAIFENESIN 5-100 MG/5ML PO LIQD: 5.0000 mL | Freq: Four times a day (QID) | ORAL | 0 refills | Status: DC | PRN

## 2018-03-15 MED ORDER — ACETAMINOPHEN 500 MG PO TABS: 500.0000 mg | ORAL_TABLET | Freq: Four times a day (QID) | ORAL | 0 refills | Status: AC | PRN

## 2018-03-15 MED ORDER — FLUTICASONE PROPIONATE 50 MCG/ACT NA SUSP: 2.0000 | Freq: Every day | NASAL | 0 refills | Status: DC

## 2018-03-15 NOTE — ED Triage Notes (Signed)
Pt reports cough, congestion and bilateral rib pain for the past few days. Pt denies fever. A.o, nad noted

## 2018-03-15 NOTE — ED Provider Notes (Addendum)
[facility] EMERGENCY DEPARTMENT Provider Note   CSN: [id number] Arrival date & time: 03/15/18  1304     History   Chief Complaint Chief Complaint  Patient presents with  . Cough  . Chest Pain    HPI Amber [name] is a 78 y.o. female with history of CLL, diabetes mellitus, HLD, HTN presents for evaluation of acute onset, persistent URI symptoms for 6 days.  She notes nasal congestion, sinus pressure, nonproductive cough.  Notes in the last 2 days or so she is developed bilateral chest wall pain with cough only.  Denies significant shortness of breath or chest pains otherwise.  Denies abdominal pain, nausea, vomiting, fevers, chills.  Has been taking NyQuil without significant relief of her symptoms.  Went to her PCP 2 days ago who advised that she was outside of the window for Tamiflu.  She is unsure if she has had any sick contacts.  She is a non-smoker.  The history is provided by the patient.    Past Medical History:  Diagnosis Date  . Anemia   . Arthritis   . Back pain   . Blood transfusion without reported diagnosis   . CLL (chronic lymphocytic leukemia) ([place]) 09/21/2015  . Diabetes mellitus without complication ([place])   . Dizziness 12/27/2014  . H/O echocardiogram    a. 2015: echo showing a preserved EF of 60-65%, Grade 1 DD and mild MR.  Marland Kitchen History of cardiac cath    a. normal cors by cath in 03/2010  . Hyperlipidemia   . Hypertension   . Palpitations     Patient Active Problem List   Diagnosis Date Noted  . Chest congestion 03/13/2018  . Chest pain 01/26/2018  . Bradycardia 01/26/2018  . Stress at home 11/19/2017  . Lower extremity edema 09/30/2016  . Swelling of lower extremity 09/16/2016  . Right hip pain 03/19/2016  . Microcytic anemia 10/05/2015  . Meralgia paraesthetica 10/04/2015  . Atypical lymphocytosis 09/21/2015  . Left arm weakness 08/18/2015  . Greater trochanteric bursitis of right hip 07/06/2013  . Heart palpitations  05/01/2012  . Chronic sinusitis 09/02/2011  . GERD 06/05/2006  . Diabetes mellitus without complication ([place]) 38/25/0539  . Hyperlipidemia associated with type 2 diabetes mellitus ([place]) 04/03/2006  . OBESITY, NOS 04/03/2006  . Iron deficiency anemia 04/03/2006  . HYPERTENSION, BENIGN SYSTEMIC 04/03/2006  . MITRAL VALVE PROLAPSE 04/03/2006  . OSTEOARTHRITIS, MULTI SITES 04/03/2006  . Proteinuria 04/03/2006    Past Surgical History:  Procedure Laterality Date  . ABDOMINAL HYSTERECTOMY    . COLON SURGERY       OB History   No obstetric history on file.      Home Medications    Prior to Admission medications   Medication Sig Start Date End Date Taking? Authorizing Provider  acetaminophen (TYLENOL) 500 MG tablet Take 1 tablet (500 mg total) by mouth every 6 (six) hours as needed. 03/15/18   Nils Flack, Ireta Pullman A, PA-C  aspirin EC 81 MG tablet Take 81 mg by mouth daily.    [provider]  baclofen (LIORESAL) 10 MG tablet Take 1 tablet (10 mg total) by mouth 3 (three) times daily. 02/18/18   Alveda Reasons, MD  Dextromethorphan-guaiFENesin 5-100 MG/5ML LIQD Take 5 mLs by mouth every 6 (six) hours as needed (cough). 03/15/18   Mendel Binsfeld A, PA-C  diclofenac sodium (VOLTAREN) 1 % GEL Apply 2 g topically 4 (four) times daily. 02/18/18   Alveda Reasons, MD  fluticasone Asencion Islam)  50 MCG/ACT nasal spray Place 2 sprays into both nostrils daily. 03/15/18   Nils Flack, Kristain Filo A, PA-C  isosorbide mononitrate (IMDUR) 30 MG 24 hr tablet Take 1 tablet (30 mg total) by mouth daily. 01/29/18   Zenia Resides, MD  losartan-hydrochlorothiazide (HYZAAR) 100-12.5 MG tablet Take 1 tablet by mouth daily. 01/10/17   Eloise Levels, MD  metFORMIN (GLUCOPHAGE) 500 MG tablet Take 1 tablet (500 mg total) by mouth daily. 12/22/17   Sherene Sires, DO  Multiple Vitamins-Minerals (WOMENS ONE DAILY) TABS Take 1 tablet by mouth daily.    [provider]  pantoprazole (PROTONIX) 20 MG tablet Take 1 tablet (20  mg total) by mouth daily. 02/14/18   Daleen Bo, MD  pravastatin (PRAVACHOL) 40 MG tablet Take 1 tablet (40 mg total) by mouth daily. 01/10/17   Eloise Levels, MD  timolol (BETIMOL) 0.5 % ophthalmic solution Place 1 drop into the right eye 2 (two) times daily. 04/12/13   Gerda Diss, DO    Family History Family History  Problem Relation Age of Onset  . Pancreatic cancer Sister        3 sisters died of it  . Heart attack Father 73       Massive MI  . Diabetes Mother   . Cancer Mother   . Hypertension Son   . Congestive Heart Failure Son   . Diabetes Son   . Hypertension Daughter     Social History Social History   Tobacco Use  . Smoking status: Never Smoker  . Smokeless tobacco: Former Network engineer Use Topics  . Alcohol use: No  . Drug use: No     Allergies   Amoxicillin; Sulfamethoxazole; Sulfonamide derivatives; Codeine; and Penicillins   Review of Systems Review of Systems  Constitutional: Negative for chills and fever.  HENT: Positive for congestion and sinus pressure. Negative for sore throat.   Respiratory: Positive for cough. Negative for shortness of breath.   Cardiovascular: Positive for chest pain (with cough only).  Gastrointestinal: Negative for abdominal pain, nausea and vomiting.  All other systems reviewed and are negative.    Physical Exam Updated Vital Signs BP 132/72   Pulse 66   Temp 98.3 F (36.8 C) (Oral)   Resp (!) 21   SpO2 98%   Physical Exam Vitals signs and nursing note reviewed.  Constitutional:      General: She is not in acute distress.    Appearance: She is well-developed.  HENT:     Head: Normocephalic and atraumatic.     Comments: Sounds audibly congested.  Mucosal edema bilaterally.  There is some frontal sinus tenderness, no maxillary sinus tenderness.  Posterior pharynx without erythema, tonsillar hypertrophy, exudates, or uvular deviation.  Tolerating secretions without difficulty.  Phonating normally. Eyes:      General:        Right eye: No discharge.        Left eye: No discharge.     Extraocular Movements: Extraocular movements intact.     Conjunctiva/sclera: Conjunctivae normal.     Pupils: Pupils are equal, round, and reactive to light.  Neck:     Musculoskeletal: Normal range of motion and neck supple.     Vascular: No JVD.     Trachea: No tracheal deviation.  Cardiovascular:     Rate and Rhythm: Normal rate and regular rhythm.  Pulmonary:     Effort: Pulmonary effort is normal. No tachypnea.     Comments: Speaking in full sentences  without difficulty. Chest:     Chest wall: Tenderness present. No deformity.     Comments: Bilateral lateral chest wall tenderness with no deformity, crepitus, ecchymosis, or flail segment Abdominal:     General: There is no distension.     Palpations: Abdomen is soft. There is no mass.     Tenderness: There is no abdominal tenderness. There is no guarding.  Lymphadenopathy:     Cervical: No cervical adenopathy.  Skin:    General: Skin is warm and dry.     Findings: No erythema.  Neurological:     Mental Status: She is alert.  Psychiatric:        Behavior: Behavior normal.      ED Treatments / Results  Labs (all labs ordered are listed, but only abnormal results are displayed) Labs Reviewed  BASIC METABOLIC PANEL - Abnormal; Notable for the following components:      Result Value   Creatinine, Ser 1.32 (*)    GFR calc non Af Amer 39 (*)    GFR calc Af Amer 45 (*)    All other components within normal limits  CBC - Abnormal; Notable for the following components:   Hemoglobin 10.9 (*)    HCT 35.8 (*)    MCH 24.9 (*)    All other components within normal limits  I-STAT TROPONIN, ED    EKG EKG Interpretation  Date/Time:  Sunday March 15 2018 13:09:15 EST Ventricular Rate:  75 PR Interval:  158 QRS Duration: 84 QT Interval:  404 QTC Calculation: 451 R Axis:   177 Text Interpretation:  Normal sinus rhythm Right axis deviation  Abnormal ECG Confirmed by Carmin Muskrat [phone]) on 03/15/2018 3:02:59 PM   Radiology Dg Chest 2 View  Result Date: 03/15/2018 CLINICAL DATA:  78 year old female with cough and congestion for 5 days. Right lateral chest pain under the breast. EXAM: CHEST - 2 VIEW COMPARISON:  Chest radiographs 02/14/2018 and earlier. FINDINGS: Normal lung volumes. Stable mild cardiomegaly. Other mediastinal contours are within normal limits. Visualized tracheal air column is within normal limits. Stable lung markings with no pneumothorax, pulmonary edema, pleural effusion or or acute pulmonary opacity. Mild chronic scarring suspected in the lingula. No acute osseous abnormality identified. Negative visible bowel gas pattern. IMPRESSION: No acute cardiopulmonary abnormality. Electronically Signed   By: Genevie Ann M.D.   On: 03/15/2018 14:41    Procedures Procedures (including critical care time)  Medications Ordered in ED Medications - No data to display   Initial Impression / Assessment and Plan / ED Course  I have reviewed the triage vital signs and the nursing notes.  Pertinent labs & imaging results that were available during my care of the patient were reviewed by me and considered in my medical decision making (see chart for details).     Patient presenting with URI symptoms for 6 days.  She is afebrile, intermittently mildly hypertensive in the ED but vital signs otherwise stable.  She is nontoxic in appearance.  Tolerating secretions without difficulty.  Patient chest xray negative for acute infiltrate and lungs are clear to auscultation bilaterally.  No evidence of strep pharyngitis, PTA, RPA, or meningitis.  EKG shows normal sinus rhythm.  Troponin obtained in triage is negative.  Doubt ACS/MI and I do not think that she requires serial troponins as her chest pain appears to be musculoskeletal in nature secondary to persistent cough.  Doubt PE.  Remainder of lab work reviewed by me shows stable H&H though  she  is mildly anemic.  Mildly elevated creatinine but her BUN is within normal limits.  Patients symptoms are consistent with URI, likely viral etiology. Discussed that antibiotics are not indicated for viral infections. Pt will be discharged with symptomatic treatment.  Recommend follow with PCP if symptoms persist.  Discussed strict ED return precautions.  Patient and daughter verbalized understanding of and agreement with plan and patient stable for discharge home at this time.  Final Clinical Impressions(s) / ED Diagnoses   Final diagnoses:  Viral URI with cough  Chest wall pain    ED Discharge Orders         Ordered    fluticasone (FLONASE) 50 MCG/ACT nasal spray  Daily     03/15/18 1523    Dextromethorphan-guaiFENesin 5-100 MG/5ML LIQD  Every 6 hours PRN     03/15/18 1523    acetaminophen (TYLENOL) 500 MG tablet  Every 6 hours PRN     03/15/18 1523           Renita Papa, PA-C 03/15/18 1528    Carmin Muskrat, MD 03/15/18 1542

## 2018-03-15 NOTE — Discharge Instructions (Addendum)
Drink plenty water and get plenty of rest.  Take flonase to decrease nasal congestion.  You can use nasal saline spray, humidifier, and/or Netti pot (sinus rinses) using distilled water purchased at a pharmacy or grocery store for nasal congestion. Do not use tap water. You may find Delsym helpful with your cough.  You can take [phone] mg of Tylenol every 6 hours as needed for pain/fever. Do not exceed 4000 mg of Tylenol daily.   Followup with your primary care doctor in 3-5 days for recheck of ongoing symptoms. Return to emergency department for emergent changing or worsening of symptoms such as throat tightness, facial swelling, fever not controlled by ibuprofen or Tylenol,difficulty breathing, or chest pain.

## 2018-03-16 ENCOUNTER — Ambulatory Visit: Payer: Medicare Other | Admitting: Family Medicine

## 2018-04-01 LAB — HM DIABETES EYE EXAM

## 2018-04-06 ENCOUNTER — Other Ambulatory Visit: Payer: Self-pay

## 2018-04-06 MED ORDER — LOSARTAN POTASSIUM-HCTZ 100-12.5 MG PO TABS: 1.0000 | ORAL_TABLET | Freq: Every day | ORAL | 0 refills | Status: DC

## 2018-04-06 NOTE — Telephone Encounter (Signed)
Pt is out of meds.  Advised I would send in one months worth and send additional refills to Dr. Criss Rosales.    Last visit to discuss HTN was last year.  Appt made for 04/24/18,  Pt appreciative. Fleeger, Salome Spotted, CMA

## 2018-04-17 ENCOUNTER — Encounter: Payer: Self-pay | Admitting: Family Medicine

## 2018-04-23 ENCOUNTER — Other Ambulatory Visit: Payer: Self-pay | Admitting: Family Medicine

## 2018-04-23 ENCOUNTER — Telehealth: Payer: Self-pay | Admitting: Family Medicine

## 2018-04-23 MED ORDER — PRAVASTATIN SODIUM 40 MG PO TABS: 40.0000 mg | ORAL_TABLET | Freq: Every day | ORAL | 3 refills | Status: DC

## 2018-04-23 NOTE — Telephone Encounter (Signed)
Pt is calling to cancel her dm f/u with Dr. Criss Rosales that was scheduled for 04/24/18. She does not want to be in the office with everything going on.   Pt would like to have a refill of cholesterol medication sent to her pharmacy. She said she does not remember the name of the medication.

## 2018-04-23 NOTE — Progress Notes (Signed)
Due to coronavirus outbreak patient would like to cancel her appointment, all she asked for is a cholesterol medicine refill she has no complaints at this time.  Dr. Criss Rosales

## 2018-04-24 ENCOUNTER — Ambulatory Visit: Payer: Medicare Other | Admitting: Family Medicine

## 2018-05-04 ENCOUNTER — Telehealth: Payer: Self-pay

## 2018-05-04 NOTE — Telephone Encounter (Signed)
Attempted to call patient regarding her triage call this AM.  No answer.  Please attempt to return call in Telemedicine this PM.  Thanks.

## 2018-05-04 NOTE — Telephone Encounter (Signed)
Pt called nurse line complaining of right sided pain. Pt stated it stared on Friday, it has been a 10/10 even with Tylenol. Pt kept saying, "it just hurts so bad, I want to be seen, I want to be seen." I tried calling back triage, all persons unavailable.   Will send message to triage team to call her.  [phone]

## 2018-05-04 NOTE — Telephone Encounter (Signed)
Return patient call.  Patient states that she has had hip pain radiating down to her groin since Friday.  States that sometimes it also radiates to her umbilicus.  States that she did take Tylenol today and it eased off.  Denies any nausea.  Denies any incontinence.  Denies any trauma to the area.  Denies any fever.  Denies any dysuria or other urinary complaints.  Nothing makes it worse.  Has been able to walk on it as well.  Discussed that this is likely hip arthritis as patient has had a previous medical problem of this in the past.  Discussed conservative measures such as Tylenol and stretches.  Patient is adamant that she needs to be seen by a physician.  Is very concerned of appendicitis.  Explained to patient that appendicitis pain would likely be right lower quadrant rather than hip however patient is adamant that she wants to be seen here in clinic.  Access to care appointment made for tomorrow morning.

## 2018-05-04 NOTE — Telephone Encounter (Signed)
Attempted to call. No answer, no machine to leave VM.  Will attempt again later.

## 2018-05-05 ENCOUNTER — Telehealth: Payer: Self-pay | Admitting: *Deleted

## 2018-05-05 ENCOUNTER — Ambulatory Visit: Payer: Medicare Other | Admitting: Family Medicine

## 2018-05-05 ENCOUNTER — Other Ambulatory Visit: Payer: Self-pay

## 2018-05-05 ENCOUNTER — Ambulatory Visit
Admission: RE | Admit: 2018-05-05 | Discharge: 2018-05-05 | Disposition: A | Discharge status: Home / Self Care | Payer: Medicare Other | Source: Ambulatory Visit | Attending: Family Medicine | Admitting: Family Medicine

## 2018-05-05 VITALS — BP 134/72 | HR 77 | Temp 98.3°F | Ht 62.0 in | Wt 200.0 lb

## 2018-05-05 DIAGNOSIS — M25551 Pain in right hip: Secondary | ICD-10-CM

## 2018-05-05 MED ORDER — TRAMADOL HCL 50 MG PO TABS: 50.0000 mg | ORAL_TABLET | Freq: Three times a day (TID) | ORAL | 0 refills | Status: DC | PRN

## 2018-05-05 MED ORDER — LIDOCAINE 4 % EX PTCH: 1.0000 | MEDICATED_PATCH | Freq: Every day | CUTANEOUS | 0 refills | Status: DC | PRN

## 2018-05-05 NOTE — Assessment & Plan Note (Addendum)
Appears to be acute on chronic.  Likelihood of intra-abdominal process is very low given benign abdominal exam, normal bowel movements, and normal appetite.  Patient is not taking any long-term glucocorticoids, but she is an elderly female, so her risk of fracture is higher than normal.  Her last DEXA scan was in 2008, so it is not very useful.  Will prescribe tramadol for short arm pain relief and obtain an x-ray of her hip to ensure that she does not have a hip fracture, although I think this is unlikely given her lack of history of trauma.  Last BMP is notable for an AKI, so will obtain another BMP.  If her creatinine has returned to normal, we can consider adding a short course of NSAIDs if she would find this helpful.  Patient would also benefit from a repeat DEXA scan in the future.

## 2018-05-05 NOTE — Progress Notes (Signed)
Subjective:    Amber [name] - 78 y.o. female MRN [id number]  Date of birth: 20-May-1940  CC:  Amber [name] is here for R hip pain.  HPI: -Has chronic right hip pain, but her pain acutely worsened on Friday, March 27 -No known trauma to the area -Sometimes needs to walk with a limp, but does not walk with a limp currently -Has tried Tylenol without any relief -Pain radiates to her right shoulder blade and throughout her back and abdomen on the right side -Has been afebrile and has had normal activity level, bowel movements, and appetite, although pain has limited her mobility   Health Maintenance:  There are no preventive care reminders to display for this patient.  -  reports that she has never smoked. She quit smokeless tobacco use about 35 years ago. - Review of Systems: Per HPI. - Past Medical History: Patient Active Problem List   Diagnosis Date Noted  . Chest congestion 03/13/2018  . Chest pain 01/26/2018  . Bradycardia 01/26/2018  . Stress at home 11/19/2017  . Lower extremity edema 09/30/2016  . Swelling of lower extremity 09/16/2016  . Right hip pain 03/19/2016  . Microcytic anemia 10/05/2015  . Meralgia paraesthetica 10/04/2015  . Atypical lymphocytosis 09/21/2015  . Left arm weakness 08/18/2015  . Greater trochanteric bursitis of right hip 07/06/2013  . Heart palpitations 05/01/2012  . Chronic sinusitis 09/02/2011  . GERD 06/05/2006  . DM type 2 with diabetic background retinopathy ([place]) 04/03/2006  . Hyperlipidemia associated with type 2 diabetes mellitus ([place]) 04/03/2006  . OBESITY, NOS 04/03/2006  . Iron deficiency anemia 04/03/2006  . HYPERTENSION, BENIGN SYSTEMIC 04/03/2006  . MITRAL VALVE PROLAPSE 04/03/2006  . OSTEOARTHRITIS, MULTI SITES 04/03/2006  . Proteinuria 04/03/2006   - Medications: reviewed and updated   Objective:   Physical Exam BP 134/72   Pulse 77   Temp 98.3 F (36.8 C) (Oral)   Ht 5\' 2"  (1.575 m)   Wt 200 lb  (90.7 kg)   SpO2 96%   BMI 36.58 kg/m  Gen: NAD, alert, cooperative with exam, appears uncomfortable CV: RRR, good S1/S2, no murmur, no edema Resp: CTABL, no wheezes, non-labored Abd: SNTND, BS present, no guarding or organomegaly, no right lower quadrant tenderness, no peritoneal signs Musculoskeletal: No visual abnormality of right hip.  Significant tenderness to palpation surrounding the entire right hip area as well as in the right lower back.  Passive and active range of motion normal, although patient reports in earnest with adduction and flexion of right hip.  Normal gait. Neuro: no gross deficits.  Psych: good insight, alert and oriented        Assessment & Plan:   Right hip pain Appears to be acute on chronic.  Likelihood of intra-abdominal process is very low given benign abdominal exam, normal bowel movements, and normal appetite.  Patient is not taking any long-term glucocorticoids, but she is an elderly female, so her risk of fracture is higher than normal.  Her last DEXA scan was in 2008, so it is not very useful.  Will prescribe tramadol for short arm pain relief and obtain an x-ray of her hip to ensure that she does not have a hip fracture, although I think this is unlikely given her lack of history of trauma.  Last BMP is notable for an AKI, so will obtain another BMP.  If her creatinine has returned to normal, we can consider adding a short course of NSAIDs if she would find  this helpful.  Patient would also benefit from a repeat DEXA scan in the future.    Maia Breslow, M.D. 05/05/2018, 12:13 PM PGY-2, [place]

## 2018-05-05 NOTE — Telephone Encounter (Signed)
-----   Message from Kathrene Alu, MD sent at 05/05/2018  3:41 PM EDT ----- Would you call and let patient know that her x-ray was normal?  Thank you.

## 2018-05-05 NOTE — Telephone Encounter (Signed)
Pt informed. Deseree Blount, CMA  

## 2018-05-05 NOTE — Patient Instructions (Addendum)
It was nice meeting you today Amber [name]!  Your hip pain, we will try a medicine called tramadol.  Take this medicine up to 3 times per day as needed.  Can also try lidocaine patches if they are affordable.  We will check your kidney function today, and if it has improved, we can try a short course of other medicines if needed for your hip pain.  We will also get an x-ray today to make sure that you do not have a fracture, although this is unlikely.  You will need to have a bone density test in the future.  These let us know if your hip pain worsens or does not improve after a few days.  If you have any questions or concerns, please feel free to call the clinic.   Be well,  Dr. Shan Levans

## 2018-05-06 ENCOUNTER — Other Ambulatory Visit: Payer: Self-pay | Admitting: Family Medicine

## 2018-05-06 LAB — COMPREHENSIVE METABOLIC PANEL
ALT: 12 IU/L (ref 0–32)
AST: 14 IU/L (ref 0–40)
Albumin/Globulin Ratio: 1.2 (ref 1.2–2.2)
Albumin: 4.2 g/dL (ref 3.7–4.7)
Alkaline Phosphatase: 60 IU/L (ref 39–117)
BUN/Creatinine Ratio: 14 (ref 12–28)
BUN: 12 mg/dL (ref 8–27)
Bilirubin Total: 0.3 mg/dL (ref 0.0–1.2)
CO2: 27 mmol/L (ref 20–29)
Calcium: 9.7 mg/dL (ref 8.7–10.3)
Chloride: 102 mmol/L (ref 96–106)
Creatinine, Ser: 0.88 mg/dL (ref 0.57–1.00)
GFR calc Af Amer: 73 mL/min/{1.73_m2} (ref >59)
GFR calc non Af Amer: 64 mL/min/{1.73_m2} (ref >59)
GLOBULIN, TOTAL: 3.4 g/dL (ref 1.5–4.5)
Glucose: 120 mg/dL — ABNORMAL HIGH (ref 65–99)
Potassium: 4.2 mmol/L (ref 3.5–5.2)
SODIUM: 144 mmol/L (ref 134–144)
Total Protein: 7.6 g/dL (ref 6.0–8.5)

## 2018-05-07 ENCOUNTER — Telehealth: Payer: Self-pay

## 2018-05-07 DIAGNOSIS — I10 Essential (primary) hypertension: Secondary | ICD-10-CM

## 2018-05-07 NOTE — Telephone Encounter (Signed)
Pt called nurse line stating her pharmacy does not have her BP medication, the manufacturer is on backorder. Please send in an alternative to her pharmacy.

## 2018-05-08 MED ORDER — HYDROCHLOROTHIAZIDE 12.5 MG PO TABS: 12.5000 mg | ORAL_TABLET | Freq: Every day | ORAL | 3 refills | Status: DC

## 2018-05-08 MED ORDER — LOSARTAN POTASSIUM 100 MG PO TABS: 100.0000 mg | ORAL_TABLET | Freq: Every day | ORAL | 3 refills | Status: DC

## 2018-05-08 NOTE — Telephone Encounter (Signed)
Hyzaar is been back ordered, ordered individual generic components for Hyzaar losartan 100 and HCTZ 12.5.  Patient should not be taking these medicines with Hyzaar is a replacements for backordered prescription  Dr. Criss Rosales

## 2018-05-08 NOTE — Telephone Encounter (Signed)
Pt informed.  Nixon Kolton, CMA  

## 2018-06-02 ENCOUNTER — Other Ambulatory Visit: Payer: Self-pay

## 2018-06-02 ENCOUNTER — Telehealth (INDEPENDENT_AMBULATORY_CARE_PROVIDER_SITE_OTHER): Payer: Medicare Other | Admitting: Family Medicine

## 2018-06-02 DIAGNOSIS — R05 Cough: Secondary | ICD-10-CM

## 2018-06-02 DIAGNOSIS — R059 Cough, unspecified: Secondary | ICD-10-CM

## 2018-06-02 NOTE — Assessment & Plan Note (Signed)
Likely an acute viral illness with cough, chills and myalgia.  Viral cough is also possible.  Very hard to know given short duration of problem.  No red flag symptoms.

## 2018-06-02 NOTE — Progress Notes (Signed)
Agrees to phone visit.  Does not have video capability.  Doesn't feel right today.  Not terribly ill, just a bit off.  Checked BS to make sure OK.  FBS 150.  Went up to 185 post breakfast.  Last A1C=6.5, but that was one year ago. Now has a bit of chills and cough.Took temp 98.5.  Also has aching in legs.  Chills are not hard shaking chills, just a coldish feeling.  No chest pain, sore throat or SOB.  She has been great at social isolation.  Doesn't get out much and rigorously follows rules when she does.  Lives alone.  Risk of infectious exposure sounds very low.  Felt great yesterday.  Alls sx are less than 12 hours in duration.  Diabetes, which seems well controled, is her biggest underlying problem.  We discussed it is very difficult to know this early in an illness the specific diagnosis.  At present, none of her symptoms sound serious.  We agreed that I would call again tomorrow to touch base and see if she was worsening.    Duration of call, 16 minutes.

## 2018-06-15 ENCOUNTER — Telehealth: Payer: Self-pay | Admitting: *Deleted

## 2018-06-15 NOTE — Telephone Encounter (Addendum)
Tried to contact pt to see about scheduling her AWV for this Thursday 5/14 with PCP.  WAnted to let her know that we are offering phone or virtual.  If pt calls back please inform her of this and assist her in scheduling this 1 hour appointment with PCP.Amber [name], CMA

## 2018-07-28 ENCOUNTER — Other Ambulatory Visit: Payer: Self-pay

## 2018-07-28 ENCOUNTER — Ambulatory Visit (INDEPENDENT_AMBULATORY_CARE_PROVIDER_SITE_OTHER): Payer: Medicare Other | Admitting: Family Medicine

## 2018-07-28 VITALS — BP 160/78 | HR 60

## 2018-07-28 DIAGNOSIS — M545 Low back pain, unspecified: Secondary | ICD-10-CM

## 2018-07-28 DIAGNOSIS — I10 Essential (primary) hypertension: Secondary | ICD-10-CM | POA: Diagnosis not present

## 2018-07-28 DIAGNOSIS — E113299 Type 2 diabetes mellitus with mild nonproliferative diabetic retinopathy without macular edema, unspecified eye: Secondary | ICD-10-CM

## 2018-07-28 DIAGNOSIS — G8929 Other chronic pain: Secondary | ICD-10-CM

## 2018-07-28 LAB — POCT GLYCOSYLATED HEMOGLOBIN (HGB A1C): HbA1c, POC (controlled diabetic range): 7.2 % — AB (ref 0.0–7.0)

## 2018-07-28 MED ORDER — METFORMIN HCL 500 MG PO TABS: 500.0000 mg | ORAL_TABLET | Freq: Two times a day (BID) | ORAL | 3 refills | Status: DC

## 2018-07-28 MED ORDER — HYDROCHLOROTHIAZIDE 12.5 MG PO TABS: 25.0000 mg | ORAL_TABLET | Freq: Every day | ORAL | 3 refills | Status: DC

## 2018-07-28 NOTE — Patient Instructions (Signed)
It was a pleasure to see you today! Thank you for choosing [facility] for your primary care. Amber [name] was seen for back pain and an ear ache. Come back to the clinic after you get the imaging done for your back.   1. Call the breast center to arrange your bone scan (dexa) for osteoporosis 2. Go to [facility] imaging for your xrays (it's walk in) 3. We doubled your metformin and HCTZ 4. We ordered a referal to physical therapy, if you don't hear from them by next week, call back and let us know. 5. Your ear looks like minor congestion, if you get a fever or it gets worse, let me know and I can send in an antibiotic. 6. Call me to set an appt after your images are done.  If you don't hear from [name] in two weeks, please give [name] a call to verify your results. Otherwise, we look forward to seeing you again at your next visit. If you have any questions or concerns before then, please call the clinic at [phone].   Please bring all your medications to every doctors visit   Sign up for My Chart to have easy access to your labs results, and communication with your Primary care physician.     Please check-out at the front desk before leaving the clinic.     Best,  Dr. Sherene Sires FAMILY MEDICINE RESIDENT - PGY2 07/28/2018 11:41 AM

## 2018-07-30 DIAGNOSIS — M545 Low back pain, unspecified: Secondary | ICD-10-CM | POA: Insufficient documentation

## 2018-07-30 DIAGNOSIS — G8929 Other chronic pain: Secondary | ICD-10-CM | POA: Insufficient documentation

## 2018-07-30 NOTE — Assessment & Plan Note (Signed)
No falls or injuries lately but patient with worsening chronic back pain, no radiculopoathy/neuro deficits.    Will send to PT, order back XR, and dexa.  Instructed to schedule tylenol and avoid nsaids if possible.  Will come back after imaging to discuss.

## 2018-07-30 NOTE — Assessment & Plan Note (Signed)
Had started taking lowered dose of metformin 500 daily, now with increase in aic to 7.2.  Will increase to 500BID

## 2018-07-30 NOTE — Assessment & Plan Note (Signed)
Still uncontrolled.  Patient had been on combination hyzaar but has been prescribed split medication for the past few months after a stocking problem.  She will check in with her pharmacy to see if we can start prescribing combo again.  Will increase HCTZ from 12.5mg  daily to 25mg  daily

## 2018-07-30 NOTE — Progress Notes (Signed)
    Subjective:  Amber [name] is a 78 y.o. female who presents to the [facility] today with a chief complaint of back pain.   HPI: Chronic bilateral low back pain without sciatica No falls or injuries lately but patient with worsening chronic back pain, no radiculopoathy/neuro deficits.    HYPERTENSION, BENIGN SYSTEMIC Still uncontrolled.  Patient had been on combination hyzaar but has been prescribed split medication for the past few months after a stocking problem.     DM type 2 with diabetic background retinopathy ([place]) Had started taking lowered dose of metformin 500 daily, now with increase in aic to 7.2.  No noted changes in diet  Chronic bilateral low back pain without sciatica No falls or injuries lately but patient with worsening chronic back pain, no radiculopoathy/neuro deficits.    Objective:  Physical Exam: BP (!) 160/78   Pulse 60   SpO2 97%   Gen: NAD, resting comfortably CV: RRR with no murmurs appreciated Pulm: NWOB, CTAB with no crackles, wheezes, or rhonchi GI: Normal bowel sounds present. Soft, Nontender, Nondistended. MSK: bilateral lumbar pain, no radiculopathy, no saddle anesthesia, no pain with internal/external rotation of leg. There is mild pain along midline although difficult to determine bony vs paraspinal, no change in gait. Skin: warm, dry Neuro: grossly normal, moves all extremities Psych: Normal affect and thought content  Results for orders placed or performed in visit on 07/28/18 (from the past 72 hour(s))  HgB A1c     Status: Abnormal   Collection Time: 07/28/18 11:24 AM  Result Value Ref Range   Hemoglobin A1C     HbA1c POC (<> result, manual entry)     HbA1c, POC (prediabetic range)     HbA1c, POC (controlled diabetic range) 7.2 (A) 0.0 - 7.0 %     Assessment/Plan:  HYPERTENSION, BENIGN SYSTEMIC Still uncontrolled.  Patient had been on combination hyzaar but has been prescribed split medication for the past few months after a  stocking problem.  She will check in with her pharmacy to see if we can start prescribing combo again.  Will increase HCTZ from 12.5mg  daily to 25mg  daily  DM type 2 with diabetic background retinopathy ([place]) Had started taking lowered dose of metformin 500 daily, now with increase in aic to 7.2.  Will increase to 500BID  Chronic bilateral low back pain without sciatica No falls or injuries lately but patient with worsening chronic back pain, no radiculopoathy/neuro deficits.    Will send to PT, order back XR, and dexa.  Instructed to schedule tylenol and avoid nsaids if possible.  Will come back after imaging to discuss.   Sherene Sires, DO FAMILY MEDICINE RESIDENT - PGY2 07/30/2018 8:41 PM

## 2018-08-04 ENCOUNTER — Other Ambulatory Visit: Payer: Self-pay | Admitting: Family Medicine

## 2018-08-04 ENCOUNTER — Ambulatory Visit
Admission: RE | Admit: 2018-08-04 | Discharge: 2018-08-04 | Disposition: A | Discharge status: Home / Self Care | Payer: Medicare Other | Source: Ambulatory Visit | Attending: Family Medicine | Admitting: Family Medicine

## 2018-08-04 DIAGNOSIS — M545 Low back pain, unspecified: Secondary | ICD-10-CM

## 2018-08-04 DIAGNOSIS — G8929 Other chronic pain: Secondary | ICD-10-CM

## 2018-08-04 DIAGNOSIS — M4310 Spondylolisthesis, site unspecified: Secondary | ICD-10-CM

## 2018-08-05 NOTE — Progress Notes (Signed)
Please call patient,  "Dr. Criss Rosales is asking [name] to arrange a f/u MRI for your back.  The XR didn't show any fractures, which is good but the MRI can be better for seeing nerve problems so that's the next step to understand your pain.  If you want to have a longer conversation about that, we can schedule a phone call with Dr. Criss Rosales or we can help you get set for the MRI and you can talk to Dr. Criss Rosales after it's done."  -Dr. Criss Rosales

## 2018-08-06 ENCOUNTER — Telehealth: Payer: Self-pay | Admitting: *Deleted

## 2018-08-06 NOTE — Telephone Encounter (Signed)
-----   Message from Sherene Sires, DO sent at 08/05/2018  7:13 PM EDT ----- Please call patient (instructions in note)

## 2018-08-06 NOTE — Telephone Encounter (Signed)
Tried to contact pt to inform her of below and to see if she wants to do MRI first or would she like to set up a phone visit if she would like to have a longer conversation.  Please inform her of below if she calls back, and if she would like [name] to go ahead and schedule her MRI check what time of day and any conflicts she may have and we can call GI and have them contact her to schedule. Ariel Dimitri Zimmerman Rumple, [place], DO at 08/04/2018 1:44 PM  Status: Signed    Please call patient,  "Dr. Criss Rosales is asking [name] to arrange a f/u MRI for your back.  The XR didn't show any fractures, which is good but the MRI can be better for seeing nerve problems so that's the next step to understand your pain.  If you want to have a longer conversation about that, we can schedule a phone call with Dr. Criss Rosales or we can help you get set for the MRI and you can talk to Dr. Criss Rosales after it's done."  -Dr. Criss Rosales

## 2018-08-10 ENCOUNTER — Ambulatory Visit: Payer: Medicare Other | Attending: Family Medicine

## 2018-08-10 NOTE — Telephone Encounter (Signed)
Tried to contact pt and phone only rang with no option to LVM.  If she calls back please give her Dr. Perley Jain message and let us know what she would like to do. Amber [name], CMA

## 2018-08-13 NOTE — Telephone Encounter (Signed)
Contacted pt and she said that she would like to have a tele visit prior to having an MRI.  Pt is scheduled for 08/25/2018 at 2:30pm for this. Hans Rusher Zimmerman Rumple, CMA

## 2018-08-21 ENCOUNTER — Ambulatory Visit: Payer: Medicare Other

## 2018-08-25 ENCOUNTER — Telehealth: Payer: Medicare Other | Admitting: Family Medicine

## 2018-08-25 ENCOUNTER — Other Ambulatory Visit: Payer: Self-pay

## 2018-08-25 DIAGNOSIS — R079 Chest pain, unspecified: Secondary | ICD-10-CM

## 2018-08-25 MED ORDER — ISOSORBIDE MONONITRATE ER 30 MG PO TB24: 30.0000 mg | ORAL_TABLET | Freq: Every day | ORAL | 3 refills | Status: DC

## 2018-08-25 NOTE — Progress Notes (Signed)
Patient was scheduled for follow-up discussion about MRI for her back, due to miscommunication this MRI has not happened.  We called the facility to try and get her scheduled it looks like she will be scheduled likely sometime in early August.  She will reschedule to discuss results with me after that is done.  Dr. Criss Rosales

## 2018-09-05 ENCOUNTER — Other Ambulatory Visit: Payer: Self-pay | Admitting: Family Medicine

## 2018-09-05 DIAGNOSIS — I10 Essential (primary) hypertension: Secondary | ICD-10-CM

## 2018-09-07 NOTE — Telephone Encounter (Signed)
Hyzaar (combo losartan HCTZ) are supply no longer an issue per the pharmacy when I called them.  Will reorder the Hyzaar cancel the losartan HCTZ.  Pharmacy note will reflect that they should not be given together.

## 2018-09-19 ENCOUNTER — Other Ambulatory Visit: Payer: Medicare Other

## 2018-09-21 ENCOUNTER — Ambulatory Visit: Payer: Medicare Other | Admitting: Family Medicine

## 2018-10-03 ENCOUNTER — Ambulatory Visit
Admission: RE | Admit: 2018-10-03 | Discharge: 2018-10-03 | Disposition: A | Discharge status: Home / Self Care | Payer: Medicare Other | Source: Ambulatory Visit | Attending: Family Medicine | Admitting: Family Medicine

## 2018-10-03 ENCOUNTER — Other Ambulatory Visit: Payer: Self-pay

## 2018-10-03 DIAGNOSIS — M4310 Spondylolisthesis, site unspecified: Secondary | ICD-10-CM

## 2018-10-03 DIAGNOSIS — G8929 Other chronic pain: Secondary | ICD-10-CM

## 2018-10-12 ENCOUNTER — Other Ambulatory Visit: Payer: Self-pay | Admitting: Family Medicine

## 2018-10-12 DIAGNOSIS — I10 Essential (primary) hypertension: Secondary | ICD-10-CM

## 2018-10-14 ENCOUNTER — Ambulatory Visit (INDEPENDENT_AMBULATORY_CARE_PROVIDER_SITE_OTHER): Payer: Medicare Other | Admitting: Family Medicine

## 2018-10-14 ENCOUNTER — Other Ambulatory Visit: Payer: Self-pay

## 2018-10-14 ENCOUNTER — Encounter: Payer: Self-pay | Admitting: Family Medicine

## 2018-10-14 VITALS — BP 158/76 | HR 78 | Wt 200.6 lb

## 2018-10-14 DIAGNOSIS — M48061 Spinal stenosis, lumbar region without neurogenic claudication: Secondary | ICD-10-CM

## 2018-10-14 MED ORDER — BACLOFEN 10 MG PO TABS: 10.0000 mg | ORAL_TABLET | Freq: Three times a day (TID) | ORAL | 0 refills | Status: AC

## 2018-10-14 NOTE — Patient Instructions (Signed)
It was a pleasure to see you today! Thank you for choosing [facility] for your primary care. Amber [name] was seen for MRI results for her back pain. Come back to the clinic in about a month to discuss the results of your baclofen.   Today we reordered the baclofen medicine that you used to be on and sent in a referral to physical therapy.  We also cleaned up your medication list and went over the results from your MRI.  If you start getting any numbness in your legs or new incontinence please check in with [name] immediately.  If you decide that you want to get a surgical referral please check in with [name].   Please bring all your medications to every doctors visit   Sign up for My Chart to have easy access to your labs results, and communication with your Primary care physician.     Please check-out at the front desk before leaving the clinic.     Best,  Dr. Sherene Sires FAMILY MEDICINE RESIDENT - PGY3 10/14/2018 3:15 PM

## 2018-10-16 DIAGNOSIS — M48061 Spinal stenosis, lumbar region without neurogenic claudication: Secondary | ICD-10-CM | POA: Insufficient documentation

## 2018-10-16 NOTE — Assessment & Plan Note (Signed)
Patient with MRI showing multiple objective potential sources of her chronic midline back pain without sciatica.  Does have lumbar stenosis as well as general degeneration of the lumbar spine.  No concerning neurological findings and patient is still able to ambulate.  Discussed this is does not appear to be a radiating nerve pain, that baclofen might be a reasonable muscle relaxer for her sensation of spasm in her back, went over how this was preferred to Flexeril due to lack of sedation.  Also discussed that physical therapy might be appropriate as strengthening her core muscles could potentially reduce speed of degeneration.  Discussed that goal was to teach her exercises not to supervise long-term exercising.

## 2018-10-16 NOTE — Progress Notes (Signed)
    Subjective:  Amber [name] is a 78 y.o. female who presents to the [facility] today with a chief complaint of review results of MRI.   HPI: Spinal stenosis of lumbar region Chronic back pain with no emergency symptoms prompted recent order of MRI.  Patient with MRI showing multiple objective potential sources of her chronic midline back pain without sciatica.  Does have lumbar stenosis as well as general degeneration of the lumbar spine.  No concerning neurological findings and patient is still able to ambulate.   Objective:  Physical Exam: BP (!) 158/76   Pulse 78   Wt 200 lb 9.6 oz (91 kg)   SpO2 96%   BMI 36.69 kg/m   Gen: NAD, conversing comfortably CV: RRR with no murmurs appreciated Pulm: NWOB, CTAB with no crackles, wheezes, or rhonchi GI: Normal bowel sounds present. Soft, Nontender, Nondistended. MSK: Increased tonicity on paraspinal musculature of lower back, no bony tenderness to hips, patient able to ambulate with minor discomfort.  No paresthesias claimed Skin: warm, dry Neuro: grossly normal, moves all extremities Psych: Normal affect and thought content  No results found for this or any previous visit (from the past 72 hour(s)).   Assessment/Plan:  Spinal stenosis of lumbar region Patient with MRI showing multiple objective potential sources of her chronic midline back pain without sciatica.  Does have lumbar stenosis as well as general degeneration of the lumbar spine.  No concerning neurological findings and patient is still able to ambulate.  Discussed this is does not appear to be a radiating nerve pain, that baclofen might be a reasonable muscle relaxer for her sensation of spasm in her back, went over how this was preferred to Flexeril due to lack of sedation.  Also discussed that physical therapy might be appropriate as strengthening her core muscles could potentially reduce speed of degeneration.  Discussed that goal was to teach her exercises not to  supervise long-term exercising.   Sherene Sires, DO FAMILY MEDICINE RESIDENT - PGY3 10/16/2018 6:31 AM

## 2018-11-13 ENCOUNTER — Other Ambulatory Visit: Payer: Self-pay

## 2018-11-13 ENCOUNTER — Ambulatory Visit (INDEPENDENT_AMBULATORY_CARE_PROVIDER_SITE_OTHER): Payer: Medicare Other | Admitting: Family Medicine

## 2018-11-13 ENCOUNTER — Encounter: Payer: Self-pay | Admitting: Family Medicine

## 2018-11-13 VITALS — BP 124/78 | HR 79 | Wt 198.2 lb

## 2018-11-13 DIAGNOSIS — I10 Essential (primary) hypertension: Secondary | ICD-10-CM | POA: Diagnosis not present

## 2018-11-13 DIAGNOSIS — E113299 Type 2 diabetes mellitus with mild nonproliferative diabetic retinopathy without macular edema, unspecified eye: Secondary | ICD-10-CM

## 2018-11-13 DIAGNOSIS — Z23 Encounter for immunization: Secondary | ICD-10-CM | POA: Diagnosis not present

## 2018-11-13 DIAGNOSIS — R6883 Chills (without fever): Secondary | ICD-10-CM | POA: Diagnosis not present

## 2018-11-13 LAB — POCT GLYCOSYLATED HEMOGLOBIN (HGB A1C): HbA1c, POC (controlled diabetic range): 6.4 % (ref 0.0–7.0)

## 2018-11-13 MED ORDER — LOSARTAN POTASSIUM-HCTZ 100-12.5 MG PO TABS: 1.0000 | ORAL_TABLET | Freq: Every day | ORAL | 0 refills | Status: DC

## 2018-11-13 NOTE — Patient Instructions (Signed)
It was a pleasure to see you today! Thank you for choosing [facility] for your primary care. Amber [name] was seen for chills and hot flashes. Come back to the clinic in 2 weeks if this does not resolve.  Today we talked about your chills and hot flashes, it is possible this is a weather change or a mild viral illness that you have.  Since you do have a history in the past of some abnormal thyroid values were going to check that test today.  We also refilled your chronic blood pressure medicine.  We hope you do well come back to see Amber [name] in 2 weeks if this does not resolve.    Please bring all your medications to every doctors visit   Sign up for My Chart to have easy access to your labs results, and communication with your Primary care physician.     Please check-out at the front desk before leaving the clinic.     Best,  Dr. Sherene Sires FAMILY MEDICINE RESIDENT - PGY3 11/13/2018 10:29 AM

## 2018-11-13 NOTE — Assessment & Plan Note (Signed)
Well controlled and at goal with current medication  Refill chronic medication

## 2018-11-13 NOTE — Assessment & Plan Note (Signed)
Patient consents to flu shot 

## 2018-11-13 NOTE — Assessment & Plan Note (Signed)
A1c down to 6.4 from 7.5.  We are excited about this progress.  Patient said that she has been making intentional efforts to eat healthy and stay active.  No change in medication at this time

## 2018-11-13 NOTE — Assessment & Plan Note (Signed)
Patient said for the last 3 days she has had once or twice per day a roughly 5 to 10-second burst of sensation of chills followed immediately by what feels like a flood of warmth and some intermittent sweating.  This goes away immediately and spontaneously.  This is not timed with activity.  There is no shortness of breath, no chest pain, changes in activity tolerance.  No neurological symptoms.  Did not happening during emotional moments.  Unknown etiology at this time, will check thyroid but have low suspicion.  It is possible this is just a minor virus given that is only been going on for 3 days.  Will have patient come back and see [name] in 2 weeks if this is persisting.

## 2018-11-13 NOTE — Progress Notes (Signed)
    Subjective:  Amber [name] is a 78 y.o. female who presents to the [facility] today with a chief complaint of 3 days of intermittent hot flashes /chills.   HPI: Chills Patient said for the last 3 days she has had once or twice per day a roughly 5 to 10-second burst of sensation of chills followed immediately by what feels like a flood of warmth and some intermittent sweating.  This goes away immediately and spontaneously.  This is not timed with activity.  There is no shortness of breath, no chest pain, changes in activity tolerance.  No neurological symptoms.  Did not happening during emotional moments.  HYPERTENSION, BENIGN SYSTEMIC No chest complaints, no concerns of this medication.  Well controlled and at goal with current medication  DM type 2 with diabetic background retinopathy ([place]) A1c down to 6.4 from 7.5.  We are excited about this progress.  Patient said that she has been making intentional efforts to eat healthy and stay active.  No change in medication at this time   Objective:  Physical Exam: BP 124/78   Pulse 79   Wt 198 lb 3.2 oz (89.9 kg)   SpO2 97%   BMI 36.25 kg/m   Gen: NAD, conversing comfortably CV: RRR with no murmurs appreciated Pulm: NWOB, CTAB with no crackles, wheezes, or rhonchi MSK: no edema, cyanosis, or clubbing noted Skin: warm, dry Neuro: grossly normal, moves all extremities Psych: Normal affect and thought content  Results for orders placed or performed in visit on 11/13/18 (from the past 72 hour(s))  HgB A1c     Status: None   Collection Time: 11/13/18 11:00 AM  Result Value Ref Range   Hemoglobin A1C     HbA1c POC (<> result, manual entry)     HbA1c, POC (prediabetic range)     HbA1c, POC (controlled diabetic range) 6.4 0.0 - 7.0 %     Assessment/Plan:  HYPERTENSION, BENIGN SYSTEMIC Well controlled and at goal with current medication  Refill chronic medication  DM type 2 with diabetic background retinopathy ([place]) A1c down to  6.4 from 7.5.  We are excited about this progress.  Patient said that she has been making intentional efforts to eat healthy and stay active.  No change in medication at this time  Need for immunization against influenza Patient consents to flu shot  Chills Patient said for the last 3 days she has had once or twice per day a roughly 5 to 10-second burst of sensation of chills followed immediately by what feels like a flood of warmth and some intermittent sweating.  This goes away immediately and spontaneously.  This is not timed with activity.  There is no shortness of breath, no chest pain, changes in activity tolerance.  No neurological symptoms.  Did not happening during emotional moments.  Unknown etiology at this time, will check thyroid but have low suspicion.  It is possible this is just a minor virus given that is only been going on for 3 days.  Will have patient come back and see [name] in 2 weeks if this is persisting.   Sherene Sires, DO FAMILY MEDICINE RESIDENT - PGY3 11/13/2018 7:24 PM

## 2018-11-14 LAB — TSH: TSH: 3.44 u[IU]/mL (ref 0.450–4.500)

## 2018-11-19 ENCOUNTER — Telehealth: Payer: Self-pay

## 2018-11-19 DIAGNOSIS — R110 Nausea: Secondary | ICD-10-CM

## 2018-11-19 MED ORDER — ONDANSETRON HCL 4 MG PO TABS: 4.0000 mg | ORAL_TABLET | Freq: Three times a day (TID) | ORAL | 0 refills | Status: DC | PRN

## 2018-11-19 NOTE — Telephone Encounter (Signed)
Please call patient, "Amber [name] sent in some Zofran to the pharmacy for you to help with her nausea.  Please keep your appointment on Monday as this medicine will only mask the symptom and will do nothing for the potential cause of her nausea.  Amber [name]

## 2018-11-19 NOTE — Telephone Encounter (Signed)
Pt informed. Pt has an appt on Monday at 10:30 with Dr. Andria Frames. Ottis Stain, CMA

## 2018-11-19 NOTE — Telephone Encounter (Signed)
Patient calls nurse line stating she has been  nauseated for 4 days now. Patient states she is unable to "relieve" herself, nothing is coming up. Patient has been eating small meals, soups, and broths. Patient denies fever, or diarrhea. No apts until Monday. Pt is requesting something to control nausea.

## 2018-11-23 ENCOUNTER — Ambulatory Visit: Payer: Medicare Other | Admitting: Family Medicine

## 2018-11-25 ENCOUNTER — Other Ambulatory Visit: Payer: Self-pay | Admitting: *Deleted

## 2018-11-25 DIAGNOSIS — I10 Essential (primary) hypertension: Secondary | ICD-10-CM

## 2018-11-25 MED ORDER — LOSARTAN POTASSIUM-HCTZ 100-12.5 MG PO TABS: 1.0000 | ORAL_TABLET | Freq: Every day | ORAL | 3 refills | Status: DC

## 2018-12-15 ENCOUNTER — Other Ambulatory Visit: Payer: Self-pay

## 2018-12-15 ENCOUNTER — Ambulatory Visit (INDEPENDENT_AMBULATORY_CARE_PROVIDER_SITE_OTHER): Payer: Medicare Other | Admitting: Family Medicine

## 2018-12-15 VITALS — BP 130/60 | HR 81 | Ht 62.0 in | Wt 197.4 lb

## 2018-12-15 DIAGNOSIS — G8929 Other chronic pain: Secondary | ICD-10-CM

## 2018-12-15 DIAGNOSIS — M546 Pain in thoracic spine: Secondary | ICD-10-CM | POA: Diagnosis not present

## 2018-12-15 MED ORDER — IBUPROFEN 400 MG PO TABS: 600.0000 mg | ORAL_TABLET | Freq: Three times a day (TID) | ORAL | 0 refills | Status: AC | PRN

## 2018-12-15 NOTE — Progress Notes (Signed)
Subjective:  Amber [name] is a 78 y.o. female who presents to the [facility] today with a chief complaint of upper back pain.   HPI: Patient reports 3 months of worsening upper back pain.  It is worse with movement.  Is better with rest.  Patient is taking Tylenol 500 mg twice daily or 3 times daily without any relief.  Patient did have a history of lower back pain, which she reports is now better.  Patient was ordered to have physical therapy, but said that she never received appointment to go.  Back pain radiates around both sides of her back.  Denies any chest pain or trouble breathing.  Denies any injury.  Patient had normal thoracic spine x-ray on 07/2018.  Did have findings of lumbar stenosis on MRI at that time as well.  Patient has been prescribed baclofen in the past, said that this did help with the lower back pain but that she is not wanting to take it anymore as her lower back pain is improved.  She does endorse some intermittent nausea, denies any vomiting.  Denies any chills.  Thinks she may have lost a few pounds, but is not intentionally trying lose weight.  Patient said that she had a history of ulcer disease in the past, but this was 20 years ago.  Denies any abdominal issues at this time.  Patient denies any rash.  Denies any upper extremity weakness.  Creatinine was within normal limits on 04/2018 ROS: Per HPI  Family history: Patient reports that she has 2 sisters that have had pancreatic cancer.  Had one son that had lung cancer, but he smoked.  Patient denies any personal history of cancer.  Objective:  Physical Exam: BP 130/60   Pulse 81   Ht 5\' 2"  (1.575 m)   Wt 197 lb 6 oz (89.5 kg)   SpO2 97%   BMI 36.10 kg/m   Gen: NAD, resting comfortably CV: RRR with no murmurs appreciated Pulm: NWOB, CTAB with no crackles, wheezes, or rhonchi GI:  Soft, Nontender, Nondistended. MSK: Patient has 5 of 5 strength upper extremities bilaterally.  Patient has no midline thoracic  spine tenderness.  Patient is tender along the right lower rib cage from dorsal aspect radiating to ventral Skin: warm, dry  Neuro: grossly normal, moves all extremities Psych: Normal affect and thought content  No results found for this or any previous visit (from the past 72 hour(s)).   Assessment/Plan:  Chronic bilateral thoracic back pain Patient presenting with thoracic back pain.  There is improvement in lower back pain.  Pain is not improved with Tylenol.  Did have normal imaging on 07/2018 of the thoracic spine.  Without midline tenderness or history of trauma, unlikely to be fracture.  Intermittent nausea with family history of pancreatic cancer is a little concerning, but do not have high suspicion at this time that she has pancreatic cancer.  Pain is most likely MSK.  Recommend 2-week course of low-dose ibuprofen in conjunction with Tylenol, and physical therapy.  Patient follow-up with PCP if not improved.   Lab Orders  No laboratory test(s) ordered today    Meds ordered this encounter  Medications  . ibuprofen (ADVIL) 400 MG tablet    Sig: Take 1.5 tablets (600 mg total) by mouth every 8 (eight) hours as needed for up to 14 days (back pain).    Dispense:  30 tablet    Refill:  0      Marny Lowenstein,  MD, MS FAMILY MEDICINE RESIDENT - PGY3 12/15/2018 12:06 PM

## 2018-12-15 NOTE — Patient Instructions (Signed)
It was a pleasure to see you today! Thank you for choosing [facility] for your primary care. Amber [name] was seen for thoracic back pain.    We are going to try a short course of ibuprofen for 2 weeks.  Please discontinue this if you have any GI upset or if you have noticing blood in your stool.  We also referring you to physical therapy.  Please come back to clinic in 1 month to reassess your back pain.  Best,  Marny Lowenstein, MD, MS FAMILY MEDICINE RESIDENT - PGY3 12/15/2018 11:32 AM

## 2018-12-15 NOTE — Assessment & Plan Note (Signed)
Patient presenting with thoracic back pain.  There is improvement in lower back pain.  Pain is not improved with Tylenol.  Did have normal imaging on 07/2018 of the thoracic spine.  Without midline tenderness or history of trauma, unlikely to be fracture.  Intermittent nausea with family history of pancreatic cancer is a little concerning, but do not have high suspicion at this time that she has pancreatic cancer.  Pain is most likely MSK.  Recommend 2-week course of low-dose ibuprofen in conjunction with Tylenol, and physical therapy.  Patient follow-up with PCP if not improved.

## 2019-01-04 ENCOUNTER — Ambulatory Visit: Payer: Medicare Other | Attending: Family Medicine | Admitting: Physical Therapy

## 2019-01-12 ENCOUNTER — Other Ambulatory Visit: Payer: Self-pay

## 2019-01-12 ENCOUNTER — Ambulatory Visit (INDEPENDENT_AMBULATORY_CARE_PROVIDER_SITE_OTHER): Payer: Medicare Other | Admitting: Family Medicine

## 2019-01-12 ENCOUNTER — Encounter (HOSPITAL_COMMUNITY): Payer: Self-pay | Admitting: *Deleted

## 2019-01-12 ENCOUNTER — Emergency Department (HOSPITAL_COMMUNITY)
Admission: EM | Admit: 2019-01-12 | Discharge: 2019-01-12 | Disposition: A | Discharge status: Home / Self Care | Payer: Medicare Other | Attending: Emergency Medicine | Admitting: Emergency Medicine

## 2019-01-12 VITALS — BP 150/70 | HR 83 | Ht 62.0 in | Wt 196.1 lb

## 2019-01-12 DIAGNOSIS — I10 Essential (primary) hypertension: Secondary | ICD-10-CM | POA: Insufficient documentation

## 2019-01-12 DIAGNOSIS — L0231 Cutaneous abscess of buttock: Secondary | ICD-10-CM | POA: Insufficient documentation

## 2019-01-12 DIAGNOSIS — K611 Rectal abscess: Secondary | ICD-10-CM | POA: Insufficient documentation

## 2019-01-12 DIAGNOSIS — E119 Type 2 diabetes mellitus without complications: Secondary | ICD-10-CM | POA: Insufficient documentation

## 2019-01-12 DIAGNOSIS — R222 Localized swelling, mass and lump, trunk: Secondary | ICD-10-CM | POA: Diagnosis present

## 2019-01-12 DIAGNOSIS — Z7982 Long term (current) use of aspirin: Secondary | ICD-10-CM | POA: Insufficient documentation

## 2019-01-12 DIAGNOSIS — Z79899 Other long term (current) drug therapy: Secondary | ICD-10-CM | POA: Insufficient documentation

## 2019-01-12 DIAGNOSIS — L0291 Cutaneous abscess, unspecified: Secondary | ICD-10-CM

## 2019-01-12 MED ORDER — LIDOCAINE HCL (PF) 1 % IJ SOLN
5.0000 mL | Freq: Once | INTRAMUSCULAR | Status: AC
  Administered 2019-01-12: 5 mL
  Filled 2019-01-12: qty 5

## 2019-01-12 NOTE — Progress Notes (Signed)
    Subjective:  Amber [name] is a 78 y.o. female who presents to the [facility] today with a chief complaint of rectal cyst.   HPI:  Patient presents with 1 week of worsening rectal pain.  She says that she feels a cyst near her anus.  The pain is getting worse.  Patient is afebrile.  Patient has been tolerating food and drink.  She has had mild nausea but no vomiting.  Denies any abdominal pain, vomiting, fevers or chills, drainage from the site.  Patient has been doing warm soaks at home without improvement.     ROS: Per HPI   Objective:  Physical Exam: BP (!) 150/70   Pulse 83   Ht 5\' 2"  (1.575 m)   Wt 196 lb 2 oz (89 kg)   SpO2 97%   BMI 35.87 kg/m   Gen: NAD, resting comfortably CV: RRR with no murmurs appreciated Pulm: NWOB, CTAB with no crackles, wheezes, or rhonchi GU/rectal: Normal-appearing vagina, perirectal abscess on the left medial gluteal cheek, there appeared to be no crypts within the rectal vault MSK: no edema, cyanosis, or clubbing noted Skin: warm, dry Neuro: grossly normal, moves all extremities Psych: Normal affect and thought content Chaperoned with Tashira.   No results found for this or any previous visit (from the past 72 hour(s)).   Assessment/Plan:  Peri-rectal abscess Patient presents with perirectal abscess.  Patient is afebrile and vital signs are stable.  Recommend patient go to the emergency department for further evaluation and possible lancing versus from general surgery.  Patient is coming by private vehicle, her son is driving her there.  Precepted with Dr. Juleen China.  Charge nurse notified.   Lab Orders  No laboratory test(s) ordered today    No orders of the defined types were placed in this encounter.     Marny Lowenstein, MD, MS FAMILY MEDICINE RESIDENT - PGY3 01/12/2019 10:15 AM

## 2019-01-12 NOTE — Discharge Instructions (Signed)
The abscess on your buttock should continue to drain over the next few days.  It also may bleed.  In a warm tub 2 or 3 times a day and keep the area clean with soap and water.  For pain use Motrin or Tylenol.  See your doctor or return here as needed for problems.

## 2019-01-12 NOTE — Assessment & Plan Note (Signed)
Patient presents with perirectal abscess.  Patient is afebrile and vital signs are stable.  Recommend patient go to the emergency department for further evaluation and possible lancing versus from general surgery.  Patient is coming by private vehicle, her son is driving her there.  Precepted with Dr. Juleen China.  Charge nurse notified.

## 2019-01-12 NOTE — ED Notes (Signed)
Patient verbalizes understanding of discharge instructions. Opportunity for questioning and answers were provided. Armband removed by staff, pt discharged from ED.  

## 2019-01-12 NOTE — ED Provider Notes (Signed)
[facility] EMERGENCY DEPARTMENT Provider Note   CSN: [id number] Arrival date & time: 01/12/19  1045     History   Chief Complaint Chief Complaint  Patient presents with  . Abscess    HPI Amber [name] is a 78 y.o. female.     HPI   She complains of a sore on her vaginal area, present for about 2 weeks.  Similar axilla lesion, many years ago.  She denies fever, nausea, vomiting, weakness or dizziness.  She does not have any pain with defecation or urination.  She has not had any vaginal discharge.  There are no other known modifying factors.  Past Medical History:  Diagnosis Date  . Anemia   . Arthritis   . Back pain   . Blood transfusion without reported diagnosis   . CLL (chronic lymphocytic leukemia) ([facility]) 09/21/2015  . Diabetes mellitus without complication ([facility])   . Dizziness 12/27/2014  . H/O echocardiogram    a. 2015: echo showing a preserved EF of 60-65%, Grade 1 DD and mild MR.  Marland Kitchen History of cardiac cath    a. normal cors by cath in 03/2010  . Hyperlipidemia   . Hypertension   . Palpitations     Patient Active Problem List   Diagnosis Date Noted  . Peri-rectal abscess 01/12/2019  . Chronic bilateral thoracic back pain 12/15/2018  . Need for immunization against influenza 11/13/2018  . Chills 11/13/2018  . Spinal stenosis of lumbar region 10/16/2018  . Chronic bilateral low back pain without sciatica 07/30/2018  . Chest congestion 03/13/2018  . Chest pain 01/26/2018  . Bradycardia 01/26/2018  . Stress at home 11/19/2017  . Lower extremity edema 09/30/2016  . Swelling of lower extremity 09/16/2016  . Right hip pain 03/19/2016  . Microcytic anemia 10/05/2015  . Meralgia paraesthetica 10/04/2015  . Atypical lymphocytosis 09/21/2015  . Left arm weakness 08/18/2015  . Cough 08/25/2014  . Greater trochanteric bursitis of right hip 07/06/2013  . Heart palpitations 05/01/2012  . Chronic sinusitis 09/02/2011  . GERD 06/05/2006   . DM type 2 with diabetic background retinopathy ([facility]) 04/03/2006  . Hyperlipidemia associated with type 2 diabetes mellitus ([facility]) 04/03/2006  . OBESITY, NOS 04/03/2006  . Iron deficiency anemia 04/03/2006  . HYPERTENSION, BENIGN SYSTEMIC 04/03/2006  . MITRAL VALVE PROLAPSE 04/03/2006  . OSTEOARTHRITIS, MULTI SITES 04/03/2006  . Proteinuria 04/03/2006    Past Surgical History:  Procedure Laterality Date  . ABDOMINAL HYSTERECTOMY    . COLON SURGERY       OB History   No obstetric history on file.      Home Medications    Prior to Admission medications   Medication Sig Start Date End Date Taking? Authorizing Provider  acetaminophen (TYLENOL) 500 MG tablet Take 1 tablet (500 mg total) by mouth every 6 (six) hours as needed. 03/15/18   Nils Flack, Mina A, PA-C  aspirin EC 81 MG tablet Take 81 mg by mouth daily.    [provider]  Dextromethorphan-guaiFENesin 5-100 MG/5ML LIQD Take 5 mLs by mouth every 6 (six) hours as needed (cough). 03/15/18   Fawze, Mina A, PA-C  diclofenac sodium (VOLTAREN) 1 % GEL Apply 2 g topically 4 (four) times daily. 02/18/18   Alveda Reasons, MD  fluticasone (FLONASE) 50 MCG/ACT nasal spray Place 2 sprays into both nostrils daily. 03/15/18   Nils Flack, Mina A, PA-C  isosorbide mononitrate (IMDUR) 30 MG 24 hr tablet Take 1 tablet (30 mg total) by mouth daily. 08/25/18  Sherene Sires, DO  Lidocaine 4 % PTCH Apply 1 patch topically daily as needed. 05/05/18   Kathrene Alu, MD  losartan-hydrochlorothiazide (HYZAAR) 100-12.5 MG tablet Take 1 tablet by mouth daily. 11/25/18   Sherene Sires, DO  metFORMIN (GLUCOPHAGE) 500 MG tablet Take 1 tablet (500 mg total) by mouth 2 (two) times daily with a meal. 07/28/18   Sherene Sires, DO  Multiple Vitamins-Minerals (WOMENS ONE DAILY) TABS Take 1 tablet by mouth daily.    [provider]  ondansetron (ZOFRAN) 4 MG tablet Take 1 tablet (4 mg total) by mouth every 8 (eight) hours as needed for up to 12 doses for  nausea or vomiting. 11/19/18   Sherene Sires, DO  pantoprazole (PROTONIX) 20 MG tablet Take 1 tablet (20 mg total) by mouth daily. 02/14/18   Daleen Bo, MD  pravastatin (PRAVACHOL) 40 MG tablet Take 1 tablet (40 mg total) by mouth daily. 04/23/18   Sherene Sires, DO  timolol (BETIMOL) 0.5 % ophthalmic solution Place 1 drop into the right eye 2 (two) times daily. 04/12/13   Gerda Diss, DO    Family History Family History  Problem Relation Age of Onset  . Pancreatic cancer Sister        3 sisters died of it  . Heart attack Father 85       Massive MI  . Diabetes Mother   . Cancer Mother   . Hypertension Son   . Congestive Heart Failure Son   . Diabetes Son   . Hypertension Daughter     Social History Social History   Tobacco Use  . Smoking status: Never Smoker  . Smokeless tobacco: Former Network engineer Use Topics  . Alcohol use: No  . Drug use: No     Allergies   Amoxicillin, Sulfamethoxazole, Sulfonamide derivatives, Codeine, and Penicillins   Review of Systems Review of Systems  All other systems reviewed and are negative.    Physical Exam Updated Vital Signs BP 125/74   Pulse 80   Temp 98.6 F (37 C) (Oral)   Resp 16   SpO2 99%   Physical Exam Vitals signs and nursing note reviewed.  Constitutional:      Appearance: She is well-developed.  HENT:     Head: Normocephalic and atraumatic.  Eyes:     Conjunctiva/sclera: Conjunctivae normal.     Pupils: Pupils are equal, round, and reactive to light.  Neck:     Musculoskeletal: Normal range of motion and neck supple.     Trachea: Phonation normal.  Cardiovascular:     Rate and Rhythm: Normal rate.  Pulmonary:     Effort: Pulmonary effort is normal. No respiratory distress.     Breath sounds: No stridor.  Abdominal:     Palpations: Abdomen is soft.  Genitourinary:    Comments: Normal sternal vaginal genitalia.  Vaginal introitus normal.  No abscess of the labia.  Left buttocks abscess present.   It is about 3 cm from the anus, and somewhat anterior.  This is fluctuant, and tender to palpation.  The abscess is approximately 3 cm in diameter.  No skin loss or sign of ulceration.  Doubt fistula on the clinical exam. Musculoskeletal: Normal range of motion.  Skin:    General: Skin is warm and dry.  Neurological:     Mental Status: She is alert and oriented to person, place, and time.     Motor: No abnormal muscle tone.  Psychiatric:  Behavior: Behavior normal.        Thought Content: Thought content normal.        Judgment: Judgment normal.      ED Treatments / Results  Labs (all labs ordered are listed, but only abnormal results are displayed) Labs Reviewed - No data to display  EKG None  Radiology No results found.  Procedures .Marland KitchenIncision and Drainage  Date/Time: 01/12/2019 4:39 PM Performed by: Daleen Bo, MD Authorized by: Daleen Bo, MD   Consent:    Consent obtained:  Verbal   Consent given by:  Patient   Risks discussed:  Bleeding and incomplete drainage   Alternatives discussed:  No treatment Location:    Type:  Abscess   Size:  3 cm   Location: Left buttock. Pre-procedure details:    Skin preparation:  Betadine Anesthesia (see MAR for exact dosages):    Anesthesia method:  Local infiltration   Local anesthetic:  Lidocaine 1% w/o epi Procedure type:    Complexity:  Simple Procedure details:    Needle aspiration: no     Incision types:  Single straight   Incision depth:  Subcutaneous   Scalpel blade:  11   Wound management:  Probed and deloculated   Drainage:  Serosanguinous and purulent   Drainage amount:  Moderate   Wound treatment:  Wound left open   Packing materials:  None   (including critical care time)  Medications Ordered in ED Medications  lidocaine (PF) (XYLOCAINE) 1 % injection 5 mL (5 mLs Infiltration Given by Other 01/12/19 1644)     Initial Impression / Assessment and Plan / ED Course  I have reviewed the  triage vital signs and the nursing notes.  Pertinent labs & imaging results that were available during my care of the patient were reviewed by me and considered in my medical decision making (see chart for details).         Patient Vitals for the past 24 hrs:  BP Temp Temp src Pulse Resp SpO2  01/12/19 1704 125/74 - - 80 16 99 %  01/12/19 1312 (!) 143/74 - - 78 16 100 %  01/12/19 1105 - 98.6 F (37 C) Oral - - -  01/12/19 1049 (!) 164/86 98.1 F (36.7 C) Oral 87 18 99 %    4:39 PM Reevaluation with update and discussion. After initial assessment and treatment, an updated evaluation reveals she reports pain is improved, findings discussed and questions answered. Daleen Bo   Medical Decision Making: Subcutaneous abscess, left buttock, drained with improvement of symptoms. Abscess is < 5 cm, thus packing not indicated.  Doubt deep tissue infection or generalized infection.  No evidence for metabolic instability.  CRITICAL CARE-no Performed by: Daleen Bo  Nursing Notes Reviewed/ Care Coordinated Applicable Imaging Reviewed Interpretation of Laboratory Data incorporated into ED treatment  The patient appears reasonably screened and/or stabilized for discharge and I doubt any other medical condition or other [facility] requiring further screening, evaluation, or treatment in the ED at this time prior to discharge.  Plan: Home Medications- usual meds, APAP for pain; Home Treatments- warm soaks; return here if the recommended treatment, does not improve the symptoms; Recommended follow up- PCP prn   Final Clinical Impressions(s) / ED Diagnoses   Final diagnoses:  Abscess    ED Discharge Orders    None       Daleen Bo, MD 01/12/19 1949

## 2019-01-12 NOTE — ED Triage Notes (Addendum)
To ED for eval of 'cyst' at vaginal area. States she noticed it a week ago. No drainage. Describes as the size of a small plum. States 'it's real sore'. No hx of same. No difficulty with urination or defecation. Pt is ambulatory without difficulty. Fasting blood sugar this am per pt was 123.

## 2019-02-09 ENCOUNTER — Other Ambulatory Visit: Payer: Self-pay

## 2019-02-09 ENCOUNTER — Encounter: Payer: Self-pay | Admitting: Family Medicine

## 2019-02-09 ENCOUNTER — Ambulatory Visit: Payer: Medicare PPO | Admitting: Family Medicine

## 2019-02-09 DIAGNOSIS — D509 Iron deficiency anemia, unspecified: Secondary | ICD-10-CM

## 2019-02-09 DIAGNOSIS — R42 Dizziness and giddiness: Secondary | ICD-10-CM | POA: Diagnosis not present

## 2019-02-09 MED ORDER — MECLIZINE HCL 12.5 MG PO TABS: 12.5000 mg | ORAL_TABLET | Freq: Three times a day (TID) | ORAL | 0 refills | Status: DC | PRN

## 2019-02-09 NOTE — Progress Notes (Signed)
Subjective  Amber [name] is a 79 y.o. female is presenting with the following  DIZZINESS  Feeling dizzy for about a week on and off  Dizziness described as:  Swimmy headed sort of like spinning Feels like room spins: yes Lightheadedness when stands: yes but also when lies flat Palpitations or heart racing: no Prior dizziness: yes similar episode years ago Medications tried: none Patient believes might be causing their pain: not sure  Taking blood thinners: no  Symptoms Hearing Loss: no Ear Pain or fullness: no Nausea or vomiting: no Vision difficulty or double vision: no Falls: no Head trauma: no Weakness in arm or leg: no Speaking problems: no Headache: no   PMH - anemia  Objective Vital Signs reviewed BP (!) 148/70   Pulse 87   Wt 195 lb (88.5 kg)   SpO2 97%   BMI 35.67 kg/m  Heart - Regular rate and rhythm.  No murmurs, gallops or rubs.    Lungs:  Normal respiratory effort, chest expands symmetrically. Lungs are clear to auscultation, no crackles or wheezes. Neurologic exam : Cn 2-7 intact Strength equal & normal in upper & lower extremities Able to walk on heels and toes.   Balance normal  Romberg normal, finger to nose normal Symptoms occur when she lies flat from sitting postion.  No nystagmus Able to stand and walk around room without symptoms  Assessments/Plans  Vertigo The most likely diagnosis is BPPV   I have considered and concluded the patient has a very low likelihood of having: CNS causes (CVA, CN8 tumor, Seizures, MS) Vascular structural disease (vertebral artery or aortic dissection) Cardiac structural disease (valvular, tamponade, myxoma, ischemia,) Arrhythmias, or Adrenal insufficiency. Given that her symptoms sometimes occur with standing and has history of anemia will check cbc.  Watch closely for resolution     See after visit summary for details of patient instructions

## 2019-02-09 NOTE — Patient Instructions (Addendum)
Good to see you today!  Thanks for coming in.  I think you have Benign Positional Vertigo.  The more you can move your head or change position and bring it on the quicker it will go away  If you have any weakness or change in vision or falling then call us immediately  Get up slowly and be carefull with your balance  Take the pill as needed 1/2-1 tab three times a day  If not better in 1-2 weeks or if getting worse then come back  I will let you know about the blood work

## 2019-02-09 NOTE — Assessment & Plan Note (Signed)
The most likely diagnosis is BPPV   I have considered and concluded the patient has a very low likelihood of having: CNS causes (CVA, CN8 tumor, Seizures, MS) Vascular structural disease (vertebral artery or aortic dissection) Cardiac structural disease (valvular, tamponade, myxoma, ischemia,) Arrhythmias, or Adrenal insufficiency. Given that her symptoms sometimes occur with standing and has history of anemia will check cbc.  Watch closely for resolution

## 2019-02-10 ENCOUNTER — Encounter: Payer: Self-pay | Admitting: Family Medicine

## 2019-02-10 LAB — FERRITIN: Ferritin: 43 ng/mL (ref 15–150)

## 2019-02-10 LAB — CBC
Hematocrit: 34.9 % (ref 34.0–46.6)
Hemoglobin: 11.1 g/dL (ref 11.1–15.9)
MCH: 24.9 pg — ABNORMAL LOW (ref 26.6–33.0)
MCHC: 31.8 g/dL (ref 31.5–35.7)
MCV: 78 fL — ABNORMAL LOW (ref 79–97)
Platelets: 332 10*3/uL (ref 150–450)
RBC: 4.46 x10E6/uL (ref 3.77–5.28)
RDW: 13.7 % (ref 11.7–15.4)
WBC: 8.7 10*3/uL (ref 3.4–10.8)

## 2019-02-13 ENCOUNTER — Telehealth: Payer: Self-pay | Admitting: Family Medicine

## 2019-02-13 NOTE — Telephone Encounter (Signed)
**  After Hours/ Emergency Line Call**  Received a call to report that Amber [name] acute onset of left upper back pain.  Patient said back pain started earlier this morning.  Denies any falls or trauma.  Denies any loss of bowel or bladder function.  Denies any weakness in the upper extremities.  Patient took Tylenol with minimal improvement.  Does have a history of chronic back pain.  Recommend patient try OTC medications, if no improvement recommended patient go to emergency department for further evaluations.  Does not draw any fevers.  Red flags discussed.  Will forward to PCP.      Bonnita Hollow, MD PGY-3, [facility] 02/13/2019 11:48 AM

## 2019-03-04 ENCOUNTER — Other Ambulatory Visit: Payer: Self-pay | Admitting: *Deleted

## 2019-03-04 DIAGNOSIS — E113299 Type 2 diabetes mellitus with mild nonproliferative diabetic retinopathy without macular edema, unspecified eye: Secondary | ICD-10-CM

## 2019-03-04 MED ORDER — METFORMIN HCL 500 MG PO TABS: 500.0000 mg | ORAL_TABLET | Freq: Two times a day (BID) | ORAL | 3 refills | Status: DC

## 2019-03-10 DIAGNOSIS — H2513 Age-related nuclear cataract, bilateral: Secondary | ICD-10-CM | POA: Diagnosis not present

## 2019-03-10 DIAGNOSIS — H40033 Anatomical narrow angle, bilateral: Secondary | ICD-10-CM | POA: Diagnosis not present

## 2019-03-10 DIAGNOSIS — H25041 Posterior subcapsular polar age-related cataract, right eye: Secondary | ICD-10-CM | POA: Diagnosis not present

## 2019-03-10 DIAGNOSIS — H5203 Hypermetropia, bilateral: Secondary | ICD-10-CM | POA: Diagnosis not present

## 2019-03-17 ENCOUNTER — Other Ambulatory Visit: Payer: Self-pay

## 2019-03-17 ENCOUNTER — Ambulatory Visit: Payer: Medicare PPO | Admitting: Family Medicine

## 2019-03-17 DIAGNOSIS — J3489 Other specified disorders of nose and nasal sinuses: Secondary | ICD-10-CM | POA: Diagnosis not present

## 2019-03-17 DIAGNOSIS — F4321 Adjustment disorder with depressed mood: Secondary | ICD-10-CM | POA: Diagnosis not present

## 2019-03-17 MED ORDER — FLUTICASONE PROPIONATE 50 MCG/ACT NA SUSP: 2.0000 | Freq: Every day | NASAL | 0 refills | Status: DC

## 2019-03-17 NOTE — Patient Instructions (Signed)
It was great seeing you again today!  I think you do have some sinus inflammation possibly due to a viral infection or maybe allergies.  I recommend taking some Flonase to help open up the sinuses.  I refilled this and sent this to your pharmacy.  For the throbbing pain you can try some Tylenol that is generally quite helpful.

## 2019-03-18 ENCOUNTER — Encounter: Payer: Self-pay | Admitting: Family Medicine

## 2019-03-18 DIAGNOSIS — J3489 Other specified disorders of nose and nasal sinuses: Secondary | ICD-10-CM | POA: Insufficient documentation

## 2019-03-18 DIAGNOSIS — F4321 Adjustment disorder with depressed mood: Secondary | ICD-10-CM | POA: Insufficient documentation

## 2019-03-18 NOTE — Assessment & Plan Note (Signed)
Likely 2/2 allergic process vs viral URI. No other symptoms aside from rhinorrhea. Has taken flonase before and feels like it helps significantly for the same problem in the past. Flonase 2 puffs daily.

## 2019-03-18 NOTE — Progress Notes (Signed)
   CHIEF COMPLAINT / HPI: 79 year old female who presents for frontal sinus pressure. She states that the pressure has been present for 2-3 days.There is no accompanying pain. Her only accompanying symptom is some rhinorrhea. No other symptoms to report.  Of note the patient's son passed away last week and the funeral was on 2/5. The patient did not disclose the reason for death. She states that she is handling it well and that she has a very strong support system to help her get through it.  PERTINENT  PMH / PSH:    OBJECTIVE: BP (!) 142/72   Pulse 81   Wt 194 lb (88 kg)   SpO2 97%   BMI 35.48 kg/m   Gen: 79 year old AA, NAD, comfortable HEENT: palpable tenderness front sinus, ear canals without abnormality, no tm distention, no cervical lymphadenopathy, no rhinorrhea appreciated CV: rrr, no m/r/g Resp: lungs clear to ausculation bilaterally, no accessory muscle use  Neuro: Alert and oriented, Speech clear, No gross deficits. Cn 2-12 intact  ASSESSMENT / PLAN:  Sinus pressure Likely 2/2 allergic process vs viral URI. No other symptoms aside from rhinorrhea. Has taken flonase before and feels like it helps significantly for the same problem in the past. Flonase 2 puffs daily.  Grief Undergoing grief reaction at loss of child. Is handling it very well with large support system. PHQ-2 only at 1, PHQ-9 with only a 3. No SI. Offered supports services in the form of counseling but patient declined stating that it was unnecessary at this time.     Guadalupe Dawn MD PGY-3 Family Medicine Resident [place]

## 2019-03-18 NOTE — Assessment & Plan Note (Signed)
Undergoing grief reaction at loss of child. Is handling it very well with large support system. PHQ-2 only at 1, PHQ-9 with only a 3. No SI. Offered supports services in the form of counseling but patient declined stating that it was unnecessary at this time.

## 2019-03-29 ENCOUNTER — Other Ambulatory Visit: Payer: Self-pay

## 2019-03-29 ENCOUNTER — Telehealth (INDEPENDENT_AMBULATORY_CARE_PROVIDER_SITE_OTHER): Payer: Medicare PPO | Admitting: Family Medicine

## 2019-03-29 DIAGNOSIS — R29898 Other symptoms and signs involving the musculoskeletal system: Secondary | ICD-10-CM | POA: Diagnosis not present

## 2019-03-29 NOTE — Progress Notes (Signed)
[facility] Visit  Patient consented to have virtual visit. Method of visit: Telephone  Encounter participants: Patient: Amber [name] - located at Home Provider: Daisy Floro - located at Clinic Others (if applicable): None  Chief Complaint: Left arm weak (now resolved)  HPI: Patient is a pleasant individual with history of T2DM, HTN, HLD, and GERD who reports she had some weakness in her left arm for one day "a few days ago". She says the left arm weakness started after she had slept on her left side. At the time, however, she also had COVID. She denied having any chest pain, dyspnea, abdominal pain, or other weakness in other extremities. She took an extra Aspirin 81mg  at the time but this did not help her weakness. She went to bed that night and the weakness was gone the next morning. She is RIGHT handed. Denies any other falls or traumas to the left arm.  ROS: per HPI  Pertinent PMHx:  Spinal Stenosis T2DM HTN HLD Obesity OA of multiple joints  Exam:  Respiratory: Speaking in complete sentences, normal work of  breathing  Assessment/Plan: Left arm weakness Patient last had this weakness about 1 year ago (01/2018) that resolved with IMDUR or coincidentally. This most recent episode last about 16 hours, resolved the next day. Could be due to sleeping on the arm vs Cardiac etiology. No history of trauma or injury. -Patient encouraged to keep taking her meds as prescribed -Told to go to ED should she have this again with any associated chest pain, abdominal pain, nausea, vomiting, sweats, or shortness of breath.     Time spent during visit with patient: 6:20 minutes   Milus Banister, [place], PGY-2 03/29/2019 2:35 PM

## 2019-03-29 NOTE — Assessment & Plan Note (Signed)
Patient last had this weakness about 1 year ago (01/2018) that resolved with IMDUR or coincidentally. This most recent episode last about 16 hours, resolved the next day. Could be due to sleeping on the arm vs Cardiac etiology. No history of trauma or injury. -Patient encouraged to keep taking her meds as prescribed -Told to go to ED should she have this again with any associated chest pain, abdominal pain, nausea, vomiting, sweats, or shortness of breath.

## 2019-04-03 ENCOUNTER — Ambulatory Visit (HOSPITAL_COMMUNITY)
Admission: EM | Admit: 2019-04-03 | Discharge: 2019-04-03 | Disposition: A | Discharge status: Home / Self Care | Payer: Medicare PPO | Attending: Emergency Medicine | Admitting: Emergency Medicine

## 2019-04-03 ENCOUNTER — Encounter (HOSPITAL_COMMUNITY): Payer: Self-pay

## 2019-04-03 ENCOUNTER — Other Ambulatory Visit: Payer: Self-pay

## 2019-04-03 ENCOUNTER — Ambulatory Visit (INDEPENDENT_AMBULATORY_CARE_PROVIDER_SITE_OTHER): Payer: Medicare PPO

## 2019-04-03 DIAGNOSIS — R0789 Other chest pain: Secondary | ICD-10-CM | POA: Diagnosis not present

## 2019-04-03 DIAGNOSIS — M546 Pain in thoracic spine: Secondary | ICD-10-CM | POA: Diagnosis not present

## 2019-04-03 DIAGNOSIS — U071 COVID-19: Secondary | ICD-10-CM | POA: Diagnosis not present

## 2019-04-03 MED ORDER — TIZANIDINE HCL 2 MG PO TABS: 2.0000 mg | ORAL_TABLET | Freq: Three times a day (TID) | ORAL | 0 refills | Status: DC | PRN

## 2019-04-03 NOTE — Discharge Instructions (Addendum)
Your chest x-ray was normal. This appears to be musculoskeletal. I am sending you home with Zanaflex which will help with muscle spasms. You may try six 500 mg of Tylenol and 200 mg of ibuprofen three or four times a day as needed for pain. If you develop a rash in the area of pain, then this is shingles. You may return here or see your doctor for reevaluation. Follow-up with your doctor in several days if you are not getting any better, go immediately to the ER if you get worse.

## 2019-04-03 NOTE — ED Provider Notes (Signed)
HPI  SUBJECTIVE  Amber [name] is a 79 y.o. female who presents with dull posterior intermittent seconds long right thoracic back pain starting yesterday.  She reports burning anterior right upper chest pain starting today. She reports an occasional cough.  No fevers, wheezing, shortness of breath.  No trauma to the area, rash.  No change in physical activity.  No left-sided chest pain, pressure, heaviness.  No radiation of this pain up her neck, down her arm.  No antipyretic in the past 4 to 6 hours.  No calf pain, swelling, hemoptysis, surgery in the past 4 weeks, recent immobilization.  She tried 500 mg Tylenol every 4 hours without much improvement in her symptoms.  Symptoms worse with torso rotation to the left.  She has had similar back pain before which was thought to be due to arthritis however she has never had this burning anterior chest pain.  She tested positive for Covid 15 days ago.  She has a history of hypertension on an ACE inhibitor, diabetes, chronic thoracic bilateral back pain, CLL per chart review.  Patient denies history of this.  She has history of chickenpox as a child.  No history of shingles MI coronary disease, liver or kidney disease, PE, DVT.  PMD: [facility].   Past Medical History:  Diagnosis Date  . Anemia   . Arthritis   . Back pain   . Blood transfusion without reported diagnosis   . CLL (chronic lymphocytic leukemia) ([place]) 09/21/2015  . Diabetes mellitus without complication ([place])   . Dizziness 12/27/2014  . H/O echocardiogram    a. 2015: echo showing a preserved EF of 60-65%, Grade 1 DD and mild MR.  Marland Kitchen History of cardiac cath    a. normal cors by cath in 03/2010  . Hyperlipidemia   . Hypertension   . Palpitations     Past Surgical History:  Procedure Laterality Date  . ABDOMINAL HYSTERECTOMY    . COLON SURGERY      Family History  Problem Relation Age of Onset  . Pancreatic cancer Sister        3 sisters died of it  . Heart  attack Father 82       Massive MI  . Diabetes Mother   . Cancer Mother   . Hypertension Son   . Congestive Heart Failure Son   . Diabetes Son   . Hypertension Daughter     Social History   Tobacco Use  . Smoking status: Never Smoker  . Smokeless tobacco: Former Network engineer Use Topics  . Alcohol use: No  . Drug use: No    No current facility-administered medications for this encounter.  Current Outpatient Medications:  .  acetaminophen (TYLENOL) 500 MG tablet, Take 1 tablet (500 mg total) by mouth every 6 (six) hours as needed., Disp: 30 tablet, Rfl: 0 .  aspirin EC 81 MG tablet, Take 81 mg by mouth daily., Disp: , Rfl:  .  Dextromethorphan-guaiFENesin 5-100 MG/5ML LIQD, Take 5 mLs by mouth every 6 (six) hours as needed (cough)., Disp: 118 mL, Rfl: 0 .  diclofenac sodium (VOLTAREN) 1 % GEL, Apply 2 g topically 4 (four) times daily., Disp: 1 Tube, Rfl: 3 .  fluticasone (FLONASE) 50 MCG/ACT nasal spray, Place 2 sprays into both nostrils daily., Disp: 16 g, Rfl: 0 .  isosorbide mononitrate (IMDUR) 30 MG 24 hr tablet, Take 1 tablet (30 mg total) by mouth daily., Disp: 90 tablet, Rfl: 3 .  Lidocaine 4 %  PTCH, Apply 1 patch topically daily as needed., Disp: 5 patch, Rfl: 0 .  losartan-hydrochlorothiazide (HYZAAR) 100-12.5 MG tablet, Take 1 tablet by mouth daily., Disp: 90 tablet, Rfl: 3 .  meclizine (ANTIVERT) 12.5 MG tablet, Take 1 tablet (12.5 mg total) by mouth 3 (three) times daily as needed for dizziness., Disp: 30 tablet, Rfl: 0 .  metFORMIN (GLUCOPHAGE) 500 MG tablet, Take 1 tablet (500 mg total) by mouth 2 (two) times daily with a meal., Disp: 90 tablet, Rfl: 3 .  Multiple Vitamins-Minerals (WOMENS ONE DAILY) TABS, Take 1 tablet by mouth daily., Disp: , Rfl:  .  ondansetron (ZOFRAN) 4 MG tablet, Take 1 tablet (4 mg total) by mouth every 8 (eight) hours as needed for up to 12 doses for nausea or vomiting., Disp: 12 tablet, Rfl: 0 .  pantoprazole (PROTONIX) 20 MG tablet, Take 1  tablet (20 mg total) by mouth daily. (Patient not taking: Reported on 02/09/2019), Disp: 30 tablet, Rfl: 0 .  pravastatin (PRAVACHOL) 40 MG tablet, Take 1 tablet (40 mg total) by mouth daily., Disp: 90 tablet, Rfl: 3 .  timolol (BETIMOL) 0.5 % ophthalmic solution, Place 1 drop into the right eye 2 (two) times daily., Disp: 10 mL, Rfl: 12 .  tiZANidine (ZANAFLEX) 2 MG tablet, Take 1 tablet (2 mg total) by mouth every 8 (eight) hours as needed for muscle spasms., Disp: 21 tablet, Rfl: 0  Allergies  Allergen Reactions  . Amoxicillin Itching and Other (See Comments)    REACTION: arrythmia Has patient had a PCN reaction causing immediate rash, facial/tongue/throat swelling, SOB or lightheadedness with hypotension: Yes Has patient had a PCN reaction causing severe rash involving mucus membranes or skin necrosis: No Has patient had a PCN reaction that required hospitalization: No Has patient had a PCN reaction occurring within the last 10 years: Yes If all of the above answers are "NO", then may proceed with Cephalosporin use.  . Sulfamethoxazole Hives and Nausea And Vomiting    REACTION: urticaria (hives) & weakness  . Sulfonamide Derivatives Hives and Nausea And Vomiting    Weakness  . Codeine Other (See Comments)    Dry mouth  . Penicillins Hives    Has patient had a PCN reaction causing immediate rash, facial/tongue/throat swelling, SOB or lightheadedness with hypotension: Yes Has patient had a PCN reaction causing severe rash involving mucus membranes or skin necrosis: No Has patient had a PCN reaction that required hospitalization: No Has patient had a PCN reaction occurring within the last 10 years: Yes If all of the above answers are "NO", then may proceed with Cephalosporin use.     ROS  As noted in HPI.   Physical Exam  BP (!) 159/98 (BP Location: Left Arm)   Pulse 92   Temp 98.9 F (37.2 C) (Oral)   Resp 18   SpO2 99%   Constitutional: Well developed, well nourished, no  acute distress Eyes: PERRL, EOMI, conjunctiva normal bilaterally HENT: Normocephalic, atraumatic,mucus membranes moist Respiratory: Clear to auscultation bilaterally, no rales, no wheezing, no rhonchi. Cardiovascular: Normal rate and rhythm, no murmurs, no gallops, no rubs Back: no CVAT right side.  No C, T, L-spine tenderness.  Positive muscular tenderness in the middle of the posterior chest from the mid thoracic area down to the lower thoracic area.  No bruising rash crepitus. skin: No rash over right side thorax anteriorly/posteriorly, skin intact.  No tenderness in the area of burning pain. Musculoskeletal: No deformities Neurologic: Alert & oriented x 3, CN III-XII  grossly intact, no motor deficits, sensation grossly intact Psychiatric: Speech and behavior appropriate   ED Course   Medications - No data to display  Orders Placed This Encounter  Procedures  . DG Chest 2 View    Standing Status:   Standing    Number of Occurrences:   1    Order Specific Question:   Reason for Exam (SYMPTOM  OR DIAGNOSIS REQUIRED)    Answer:   R posterior CP recent COVID r/o PNA PTX effusion   No results found for this or any previous visit (from the past 24 hour(s)). DG Chest 2 View  Result Date: 04/03/2019 CLINICAL DATA:  79 year old female with right chest and back burning. Recent diagnosis of COVID on 03/19/2019 EXAM: CHEST - 2 VIEW COMPARISON:  Prior chest x-ray 03/15/2018 FINDINGS: The lungs are clear and negative for focal airspace consolidation, pulmonary edema or suspicious pulmonary nodule. No pleural effusion or pneumothorax. Cardiac and mediastinal contours are within normal limits. No acute fracture or lytic or blastic osseous lesions. The visualized upper abdominal bowel gas pattern is unremarkable. IMPRESSION: No active cardiopulmonary disease. Electronically Signed   By: Jacqulynn Cadet M.D.   On: 04/03/2019 12:43    ED Clinical Impression  1. Acute right-sided thoracic back pain       ED Assessment/Plan  Thoracic back pain: Appears to be musculoskeletal, it is aggravated with torso rotation.  It spans several dermatomes.  The burning pain could be early shingles however it is higher up than where she is tender posteriorly.  We will check chest x-ray to rule out pneumonia pneumothorax effusion given recent Covid infection.  Doubt rib fracture.  Doubt PE.  Reviewed imaging independently. Normal CXR. See radiology report for full details.  Home with 2 mg Zanaflex three times a day, 200 mg ibuprofen combined with 500 mg of Tylenol 3-4 times a day as needed for pain follow-up with PMD in several days, return here if develops a rash, to the ER if she gets worse  Discussed  imaging, MDM, treatment plan, and plan for follow-up with patient Discussed sn/sx that should prompt return to the ED. patient agrees with plan.   Meds ordered this encounter  Medications  . tiZANidine (ZANAFLEX) 2 MG tablet    Sig: Take 1 tablet (2 mg total) by mouth every 8 (eight) hours as needed for muscle spasms.    Dispense:  21 tablet    Refill:  0    *This clinic note was created using [employer]. Therefore, there may be occasional mistakes despite careful proofreading.  ?   Melynda Ripple, MD 04/03/19 1320

## 2019-04-03 NOTE — ED Triage Notes (Addendum)
Pt present cough and back pain, symptoms started yesterday. Pt is having a burning sensation on the right side of her chest and back when she cough.  Pt was recently positive for covid 19 on 03/19/19

## 2019-04-14 ENCOUNTER — Encounter: Payer: Self-pay | Admitting: Family Medicine

## 2019-04-14 ENCOUNTER — Other Ambulatory Visit: Payer: Self-pay

## 2019-04-14 ENCOUNTER — Telehealth (INDEPENDENT_AMBULATORY_CARE_PROVIDER_SITE_OTHER): Payer: Medicare PPO | Admitting: Family Medicine

## 2019-04-14 DIAGNOSIS — D72820 Lymphocytosis (symptomatic): Secondary | ICD-10-CM

## 2019-04-14 NOTE — Progress Notes (Signed)
[facility] Telemedicine Visit  Patient consented to have virtual visit. Method of visit: Video was attempted, but technology challenges prevented patient from using video, so visit was conducted via telephone.  Encounter participants: Patient: Amber [name] - located at home Provider: Sherene Sires - located at [facility] clinic  Chief Complaint: Patient calls in to ask "do I have leukemia "  HPI: Was actually caught off guard by this request this patient is my primary for some time.  After further chart review it looks like there was a prior diagnosis of an atypical lymphocytosis that was worked up briefly by oncology and they had said in 2017 that they intended to do a few more labs to evaluate for potential of CLL.  Patient says that she got scared of a potential diagnosis and did not return the phone call or go in for the labs.  She has no new symptoms and has been feeling well but when she went to her recent urgent care visit someone noticed this in her history and brought it up to her so she decided to follow-up on her now.  She is willing to go to the oncologist to see them and complete the work-up.  ROS: per HPI  Pertinent PMHx: Prior diagnosis of atypical leukocytosis, but recent blood work has been unremarkable  Exam:  Respiratory: No exam as patient was over the phone, she was speaking and mentating at her normal status and comfortably speaking in full sentences  Assessment/Plan:  Atypical lymphocytosis Prior work-up in 2017 by oncology, patient was lost to follow-up because she was scared of a potential diagnosis and did not return phone calls.  As this was mentioned by an urgent care doctor she saw she would like to follow-up now, referral placed to oncology so that she can go see them.  No symptoms at all    Time spent during visit with patient: 12 minutes

## 2019-04-15 NOTE — Assessment & Plan Note (Signed)
Prior work-up in 2017 by oncology, patient was lost to follow-up because she was scared of a potential diagnosis and did not return phone calls.  As this was mentioned by an urgent care doctor she saw she would like to follow-up now, referral placed to oncology so that she can go see them.  No symptoms at all

## 2019-04-16 ENCOUNTER — Telehealth: Payer: Self-pay | Admitting: Hematology and Oncology

## 2019-04-16 NOTE — Telephone Encounter (Signed)
Scheduled per referral. Called and spoke with pt, confirmed 4/1 appt. Pt made aware to arrive 15-30 mins early and to bring insurance card with photo ID

## 2019-04-19 ENCOUNTER — Telehealth: Payer: Self-pay | Admitting: Family Medicine

## 2019-04-19 ENCOUNTER — Ambulatory Visit: Payer: Medicare PPO | Admitting: Family Medicine

## 2019-04-19 NOTE — Telephone Encounter (Signed)
Attempted to reach patient for telemed AWV.  She did not pick up and there was no option to leave message.  -Dr. Genavie Boettger

## 2019-05-05 NOTE — Progress Notes (Deleted)
[place] Telephone:(336) [phone]   Fax:(336) [place] NOTE  Patient Care Team: Sherene Sires, DO as PCP - General (Family Medicine)  Hematological/Oncological History # ***  CHIEF COMPLAINTS/PURPOSE OF CONSULTATION:  "*** "  HISTORY OF PRESENTING ILLNESS:  Amber [name] 79 y.o. female with medical history significant for ***  On review of the previous records ***  On exam today ***  MEDICAL HISTORY:  Past Medical History:  Diagnosis Date  . Anemia   . Arthritis   . Back pain   . Blood transfusion without reported diagnosis   . CLL (chronic lymphocytic leukemia) ([place]) 09/21/2015  . Diabetes mellitus without complication ([place])   . Dizziness 12/27/2014  . H/O echocardiogram    a. 2015: echo showing a preserved EF of 60-65%, Grade 1 DD and mild MR.  Marland Kitchen History of cardiac cath    a. normal cors by cath in 03/2010  . Hyperlipidemia   . Hypertension   . Palpitations     SURGICAL HISTORY: Past Surgical History:  Procedure Laterality Date  . ABDOMINAL HYSTERECTOMY    . COLON SURGERY      SOCIAL HISTORY: Social History   Socioeconomic History  . Marital status: Widowed    Spouse name: Not on file  . Number of children: 5  . Years of education: Not on file  . Highest education level: Not on file  Occupational History  . Not on file  Tobacco Use  . Smoking status: Never Smoker  . Smokeless tobacco: Former Network engineer and Sexual Activity  . Alcohol use: No  . Drug use: No  . Sexual activity: Never  Other Topics Concern  . 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  5 children, lives alone.   Pt exercises by walking every other day.         Social Determinants of Health   Financial Resource Strain:   . Difficulty of Paying Living Expenses:   Food Insecurity:   . Worried About Charity fundraiser in the Last  Year:   . Arboriculturist in the Last Year:   Transportation Needs:   . Film/video editor (Medical):   Marland Kitchen Lack of Transportation (Non-Medical):   Physical Activity:   . Days of Exercise per Week:   . Minutes of Exercise per Session:   Stress:   . Feeling of Stress :   Social Connections:   . Frequency of Communication with Friends and Family:   . Frequency of Social Gatherings with Friends and Family:   . Attends Religious Services:   . Active Member of Clubs or Organizations:   . Attends Archivist Meetings:   Marland Kitchen Marital Status:   Intimate Partner Violence:   . Fear of Current or Ex-Partner:   . Emotionally Abused:   Marland Kitchen Physically Abused:   . Sexually Abused:     FAMILY HISTORY: Family History  Problem Relation Age of Onset  . Pancreatic cancer Sister        3 sisters died of it  . Heart attack Father 70       Massive MI  . Diabetes Mother   . Cancer Mother   . Hypertension Son   . Congestive Heart Failure Son   . Diabetes Son   . Hypertension Daughter     ALLERGIES:  is allergic to  amoxicillin; sulfamethoxazole; sulfonamide derivatives; codeine; and penicillins.  MEDICATIONS:  Current Outpatient Medications  Medication Sig Dispense Refill  . acetaminophen (TYLENOL) 500 MG tablet Take 1 tablet (500 mg total) by mouth every 6 (six) hours as needed. 30 tablet 0  . aspirin EC 81 MG tablet Take 81 mg by mouth daily.    Marland Kitchen Dextromethorphan-guaiFENesin 5-100 MG/5ML LIQD Take 5 mLs by mouth every 6 (six) hours as needed (cough). 118 mL 0  . diclofenac sodium (VOLTAREN) 1 % GEL Apply 2 g topically 4 (four) times daily. 1 Tube 3  . fluticasone (FLONASE) 50 MCG/ACT nasal spray Place 2 sprays into both nostrils daily. 16 g 0  . isosorbide mononitrate (IMDUR) 30 MG 24 hr tablet Take 1 tablet (30 mg total) by mouth daily. 90 tablet 3  . Lidocaine 4 % PTCH Apply 1 patch topically daily as needed. 5 patch 0  . losartan-hydrochlorothiazide (HYZAAR) 100-12.5 MG tablet  Take 1 tablet by mouth daily. 90 tablet 3  . meclizine (ANTIVERT) 12.5 MG tablet Take 1 tablet (12.5 mg total) by mouth 3 (three) times daily as needed for dizziness. 30 tablet 0  . metFORMIN (GLUCOPHAGE) 500 MG tablet Take 1 tablet (500 mg total) by mouth 2 (two) times daily with a meal. 90 tablet 3  . Multiple Vitamins-Minerals (WOMENS ONE DAILY) TABS Take 1 tablet by mouth daily.    . ondansetron (ZOFRAN) 4 MG tablet Take 1 tablet (4 mg total) by mouth every 8 (eight) hours as needed for up to 12 doses for nausea or vomiting. 12 tablet 0  . pantoprazole (PROTONIX) 20 MG tablet Take 1 tablet (20 mg total) by mouth daily. (Patient not taking: Reported on 02/09/2019) 30 tablet 0  . pravastatin (PRAVACHOL) 40 MG tablet Take 1 tablet (40 mg total) by mouth daily. 90 tablet 3  . timolol (BETIMOL) 0.5 % ophthalmic solution Place 1 drop into the right eye 2 (two) times daily. 10 mL 12  . tiZANidine (ZANAFLEX) 2 MG tablet Take 1 tablet (2 mg total) by mouth every 8 (eight) hours as needed for muscle spasms. 21 tablet 0   No current facility-administered medications for this visit.    REVIEW OF SYSTEMS:   Constitutional: ( - ) fevers, ( - )  chills , ( - ) night sweats Eyes: ( - ) blurriness of vision, ( - ) double vision, ( - ) watery eyes Ears, nose, mouth, throat, and face: ( - ) mucositis, ( - ) sore throat Respiratory: ( - ) cough, ( - ) dyspnea, ( - ) wheezes Cardiovascular: ( - ) palpitation, ( - ) chest discomfort, ( - ) lower extremity swelling Gastrointestinal:  ( - ) nausea, ( - ) heartburn, ( - ) change in bowel habits Skin: ( - ) abnormal skin rashes Lymphatics: ( - ) new lymphadenopathy, ( - ) easy bruising Neurological: ( - ) numbness, ( - ) tingling, ( - ) new weaknesses Behavioral/Psych: ( - ) mood change, ( - ) new changes  All other systems were reviewed with the patient and are negative.  PHYSICAL EXAMINATION: ECOG PERFORMANCE STATUS: {CHL ONC ECOG [phone]}  There were  no vitals filed for this visit. There were no vitals filed for this visit.  GENERAL: well appearing *** in NAD  SKIN: skin color, texture, turgor are normal, no rashes or significant lesions EYES: conjunctiva are pink and non-injected, sclera clear OROPHARYNX: no exudate, no erythema; lips, buccal mucosa, and tongue normal  NECK: supple, non-tender  LYMPH:  no palpable lymphadenopathy in the cervical, axillary or inguinal LUNGS: clear to auscultation and percussion with normal breathing effort HEART: regular rate & rhythm and no murmurs and no lower extremity edema ABDOMEN: soft, non-tender, non-distended, normal bowel sounds Musculoskeletal: no cyanosis of digits and no clubbing  PSYCH: alert & oriented x 3, fluent speech NEURO: no focal motor/sensory deficits  LABORATORY DATA:  I have reviewed the data as listed CBC Latest Ref Rng & Units 02/09/2019 03/15/2018 02/14/2018  WBC 3.4 - 10.8 x10E3/uL 8.7 7.8 7.5  Hemoglobin 11.1 - 15.9 g/dL 11.1 10.9(L) 11.1(L)  Hematocrit 34.0 - 46.6 % 34.9 35.8(L) 37.6  Platelets 150 - 450 x10E3/uL 332 297 317    CMP Latest Ref Rng & Units 05/05/2018 03/15/2018 02/14/2018  Glucose 65 - 99 mg/dL 120(H) 91 128(H)  BUN 8 - 27 mg/dL 12 20 14   Creatinine 0.57 - 1.00 mg/dL 0.88 1.32(H) 0.92  Sodium 134 - 144 mmol/L 144 141 142  Potassium 3.5 - 5.2 mmol/L 4.2 3.7 3.4(L)  Chloride 96 - 106 mmol/L 102 104 104  CO2 20 - 29 mmol/L 27 25 29   Calcium 8.7 - 10.3 mg/dL 9.7 9.3 9.2  Total Protein 6.0 - 8.5 g/dL 7.6 - 8.1  Total Bilirubin 0.0 - 1.2 mg/dL 0.3 - 0.7  Alkaline Phos 39 - 117 IU/L 60 - 49  AST 0 - 40 IU/L 14 - 20  ALT 0 - 32 IU/L 12 - 14     PATHOLOGY: ***  BLOOD FILM: *** Review of the peripheral blood smear showed normal appearing white cells with neutrophils that were appropriately lobated and granulated. There was no predominance of bi-lobed or hyper-segmented neutrophils appreciated. No Dohle bodies were noted. There was no left shifting, immature  forms or blasts noted. Lymphocytes remain normal in size without any predominance of large granular lymphocytes. Red cells show no anisopoikilocytosis, macrocytes , microcytes or polychromasia. There were no schistocytes, target cells, echinocytes, acanthocytes, dacrocytes, or stomatocytes.There was no rouleaux formation, nucleated red cells, or intra-cellular inclusions noted. The platelets are normal in size, shape, and color without any clumping evident.  RADIOGRAPHIC STUDIES: I have personally reviewed the radiological images as listed and agreed with the findings in the report. No results found.  ASSESSMENT & PLAN ***  No orders of the defined types were placed in this encounter.   All questions were answered. The patient knows to call the clinic with any problems, questions or concerns.  A total of more than {CHL ONC TIME VISIT - [id number] were spent on this encounter and over half of that time was spent on counseling and coordination of care as outlined above.   Ledell Peoples, MD Department of Hematology/Oncology [place] [facility] Phone: [phone] Pager: [phone] Email: [email or web address]  05/05/2019 10:44 PM

## 2019-05-06 ENCOUNTER — Inpatient Hospital Stay: Payer: Medicare PPO

## 2019-05-06 ENCOUNTER — Inpatient Hospital Stay: Payer: Medicare PPO | Attending: Hematology and Oncology | Admitting: Hematology and Oncology

## 2019-05-06 DIAGNOSIS — Z7984 Long term (current) use of oral hypoglycemic drugs: Secondary | ICD-10-CM | POA: Insufficient documentation

## 2019-05-06 DIAGNOSIS — Z8249 Family history of ischemic heart disease and other diseases of the circulatory system: Secondary | ICD-10-CM | POA: Insufficient documentation

## 2019-05-06 DIAGNOSIS — Z808 Family history of malignant neoplasm of other organs or systems: Secondary | ICD-10-CM | POA: Insufficient documentation

## 2019-05-06 DIAGNOSIS — E785 Hyperlipidemia, unspecified: Secondary | ICD-10-CM | POA: Insufficient documentation

## 2019-05-06 DIAGNOSIS — Z7982 Long term (current) use of aspirin: Secondary | ICD-10-CM | POA: Insufficient documentation

## 2019-05-06 DIAGNOSIS — Z800 Family history of malignant neoplasm of digestive organs: Secondary | ICD-10-CM | POA: Insufficient documentation

## 2019-05-06 DIAGNOSIS — Z8616 Personal history of COVID-19: Secondary | ICD-10-CM | POA: Insufficient documentation

## 2019-05-06 DIAGNOSIS — Z833 Family history of diabetes mellitus: Secondary | ICD-10-CM | POA: Insufficient documentation

## 2019-05-06 DIAGNOSIS — D649 Anemia, unspecified: Secondary | ICD-10-CM | POA: Insufficient documentation

## 2019-05-06 DIAGNOSIS — D72820 Lymphocytosis (symptomatic): Secondary | ICD-10-CM | POA: Insufficient documentation

## 2019-05-06 DIAGNOSIS — Z79899 Other long term (current) drug therapy: Secondary | ICD-10-CM | POA: Insufficient documentation

## 2019-05-06 DIAGNOSIS — I10 Essential (primary) hypertension: Secondary | ICD-10-CM | POA: Insufficient documentation

## 2019-05-06 DIAGNOSIS — E119 Type 2 diabetes mellitus without complications: Secondary | ICD-10-CM | POA: Insufficient documentation

## 2019-05-07 ENCOUNTER — Telehealth: Payer: Self-pay | Admitting: Hematology and Oncology

## 2019-05-07 NOTE — Telephone Encounter (Signed)
Amber [name] has been cld and rescheduled to see Dr. Lorenso Courier on 4/16 at 2pm w/labs at 3pm.

## 2019-05-11 ENCOUNTER — Other Ambulatory Visit: Payer: Self-pay

## 2019-05-11 ENCOUNTER — Encounter: Payer: Self-pay | Admitting: Family Medicine

## 2019-05-11 ENCOUNTER — Ambulatory Visit: Payer: Medicare PPO | Admitting: Family Medicine

## 2019-05-11 VITALS — BP 142/76 | HR 87 | Temp 98.6°F | Wt 190.8 lb

## 2019-05-11 DIAGNOSIS — R1011 Right upper quadrant pain: Secondary | ICD-10-CM

## 2019-05-11 DIAGNOSIS — E785 Hyperlipidemia, unspecified: Secondary | ICD-10-CM

## 2019-05-11 DIAGNOSIS — E1169 Type 2 diabetes mellitus with other specified complication: Secondary | ICD-10-CM

## 2019-05-11 DIAGNOSIS — I10 Essential (primary) hypertension: Secondary | ICD-10-CM

## 2019-05-11 NOTE — Patient Instructions (Addendum)
It was a pleasure to see you today! Thank you for choosing [facility] for your primary care. Amber [name] was seen for right upper quadrant pain. Come back to the clinic if there is anything we can do for you after we figure the situation out.   Today we drew some labs to try and figure out why you are having pain on the right side of your belly.  We are also going to order a picture called in ultrasound, please make sure you go to this appointment as scheduled.  Like we discussed, if you start having pain so severe you can get around, a fever, or nausea or vomiting to or you cannot eat.. That means that it is time to go to the emergency department so that you can be checked out on a more urgent basis.  If he is just a mildly uncomfortable will wait to see had the results of all these test come out and develop a plan from there, I do think this might be her gallbladder but we will see.   Please bring all your medications to every doctors visit   Sign up for My Chart to have easy access to your labs results, and communication with your Primary care physician.     Please check-out at the front desk before leaving the clinic.    We have scheduled your Ultrasound for tomorrow morning at 8:30. Please arrive at 8:10 for check-in at [place] not eat or drink after midnight.   Best,  Dr. Sherene Sires FAMILY MEDICINE RESIDENT - PGY3 05/11/2019 2:22 PM

## 2019-05-11 NOTE — Progress Notes (Signed)
    SUBJECTIVE:   CHIEF COMPLAINT / HPI: Right upper quadrant abdominal pain  Patient with approximately 1 week of slowly worsening right upper quadrant abdominal pain.  She has had no nausea or vomiting, she has had no bowel symptoms, she said no urinary symptoms, she has no pain elsewhere.  She says it hurts worse if she pushes on it.  This is the first time this is happened to her.  It is reasonably consistent and is dull and achy.  She says it is definitely not like a burning sensation of the skin but feels like it is "deeper ".  She has had no trouble breathing  PERTINENT  PMH / PSH:   OBJECTIVE:   BP (!) 142/76   Pulse 87   Temp 98.6 F (37 C)   Wt 190 lb 12.8 oz (86.5 kg)   SpO2 96%   BMI 34.90 kg/m   General: No acute distress, pleasant and alert, discussing at baseline Respiratory: No increased work of breathing, no cough Cardiac: Regular rate and rhythm Abdominal: Soft with no rebound tenderness, there is a mild [name] sign at the right upper quadrant and she is tender to very deep palpation.  No tenderness elsewhere  ASSESSMENT/PLAN:   Hyperlipidemia associated with type 2 diabetes mellitus On moderate intensity statin, this is the recommended intensity given her ASCVD risk and as she is generally otherwise healthy appropriate continue  HYPERTENSION, BENIGN SYSTEMIC 142/76, this is above goal of [id number] systolic but patient is uncomfortable today and I will not adjust it based off this reading  RUQ pain CBC, CMP, lipase, right upper quadrant ultrasound all unremarkable, I did call her on the eighth to discuss these results and her symptoms had improved and she was feeling fine.  No new intervention and she has been advised to reach out to [name] if the symptoms return     Sherene Sires, [place]

## 2019-05-12 ENCOUNTER — Ambulatory Visit
Admission: RE | Admit: 2019-05-12 | Discharge: 2019-05-12 | Disposition: A | Discharge status: Home / Self Care | Payer: Medicare PPO | Source: Ambulatory Visit | Attending: Family Medicine | Admitting: Family Medicine

## 2019-05-12 DIAGNOSIS — R1011 Right upper quadrant pain: Secondary | ICD-10-CM | POA: Diagnosis not present

## 2019-05-12 LAB — LIPID PANEL
Chol/HDL Ratio: 3.3 ratio (ref 0.0–4.4)
Cholesterol, Total: 163 mg/dL (ref 100–199)
HDL: 50 mg/dL (ref >39)
LDL Chol Calc (NIH): 83 mg/dL (ref 0–99)
Triglycerides: 179 mg/dL — ABNORMAL HIGH (ref 0–149)
VLDL Cholesterol Cal: 30 mg/dL (ref 5–40)

## 2019-05-12 LAB — COMPREHENSIVE METABOLIC PANEL
ALT: 14 IU/L (ref 0–32)
AST: 19 IU/L (ref 0–40)
Albumin/Globulin Ratio: 1.1 — ABNORMAL LOW (ref 1.2–2.2)
Albumin: 4 g/dL (ref 3.7–4.7)
Alkaline Phosphatase: 59 IU/L (ref 39–117)
BUN/Creatinine Ratio: 17 (ref 12–28)
BUN: 16 mg/dL (ref 8–27)
Bilirubin Total: <0.2 mg/dL (ref 0.0–1.2)
CO2: 26 mmol/L (ref 20–29)
Calcium: 10.1 mg/dL (ref 8.7–10.3)
Chloride: 101 mmol/L (ref 96–106)
Creatinine, Ser: 0.94 mg/dL (ref 0.57–1.00)
GFR calc Af Amer: 67 mL/min/{1.73_m2} (ref >59)
GFR calc non Af Amer: 58 mL/min/{1.73_m2} — ABNORMAL LOW (ref >59)
Globulin, Total: 3.6 g/dL (ref 1.5–4.5)
Glucose: 104 mg/dL — ABNORMAL HIGH (ref 65–99)
Potassium: 4.1 mmol/L (ref 3.5–5.2)
Sodium: 142 mmol/L (ref 134–144)
Total Protein: 7.6 g/dL (ref 6.0–8.5)

## 2019-05-12 LAB — CBC
Hematocrit: 33 % — ABNORMAL LOW (ref 34.0–46.6)
Hemoglobin: 10.7 g/dL — ABNORMAL LOW (ref 11.1–15.9)
MCH: 25.2 pg — ABNORMAL LOW (ref 26.6–33.0)
MCHC: 32.4 g/dL (ref 31.5–35.7)
MCV: 78 fL — ABNORMAL LOW (ref 79–97)
Platelets: 328 10*3/uL (ref 150–450)
RBC: 4.25 x10E6/uL (ref 3.77–5.28)
RDW: 14.5 % (ref 11.7–15.4)
WBC: 9.8 10*3/uL (ref 3.4–10.8)

## 2019-05-12 LAB — LIPASE: Lipase: 38 U/L (ref 14–85)

## 2019-05-13 NOTE — Assessment & Plan Note (Signed)
142/76, this is above goal of [id number] systolic but patient is uncomfortable today and I will not adjust it based off this reading

## 2019-05-13 NOTE — Assessment & Plan Note (Signed)
On moderate intensity statin, this is the recommended intensity given her ASCVD risk and as she is generally otherwise healthy appropriate continue

## 2019-05-13 NOTE — Assessment & Plan Note (Signed)
CBC, CMP, lipase, right upper quadrant ultrasound all unremarkable, I did call her on the eighth to discuss these results and her symptoms had improved and she was feeling fine.  No new intervention and she has been advised to reach out to [name] if the symptoms return

## 2019-05-21 ENCOUNTER — Inpatient Hospital Stay (HOSPITAL_BASED_OUTPATIENT_CLINIC_OR_DEPARTMENT_OTHER): Payer: Medicare PPO | Admitting: Hematology and Oncology

## 2019-05-21 ENCOUNTER — Other Ambulatory Visit: Payer: Self-pay

## 2019-05-21 ENCOUNTER — Inpatient Hospital Stay: Payer: Medicare PPO

## 2019-05-21 VITALS — BP 147/66 | HR 79 | Temp 97.8°F | Resp 20 | Ht 62.0 in | Wt 191.2 lb

## 2019-05-21 DIAGNOSIS — Z79899 Other long term (current) drug therapy: Secondary | ICD-10-CM

## 2019-05-21 DIAGNOSIS — Z7984 Long term (current) use of oral hypoglycemic drugs: Secondary | ICD-10-CM | POA: Diagnosis not present

## 2019-05-21 DIAGNOSIS — Z833 Family history of diabetes mellitus: Secondary | ICD-10-CM

## 2019-05-21 DIAGNOSIS — E785 Hyperlipidemia, unspecified: Secondary | ICD-10-CM | POA: Diagnosis not present

## 2019-05-21 DIAGNOSIS — Z7982 Long term (current) use of aspirin: Secondary | ICD-10-CM | POA: Diagnosis not present

## 2019-05-21 DIAGNOSIS — Z8616 Personal history of COVID-19: Secondary | ICD-10-CM

## 2019-05-21 DIAGNOSIS — I10 Essential (primary) hypertension: Secondary | ICD-10-CM | POA: Diagnosis not present

## 2019-05-21 DIAGNOSIS — D72820 Lymphocytosis (symptomatic): Secondary | ICD-10-CM

## 2019-05-21 DIAGNOSIS — Z808 Family history of malignant neoplasm of other organs or systems: Secondary | ICD-10-CM

## 2019-05-21 DIAGNOSIS — Z8249 Family history of ischemic heart disease and other diseases of the circulatory system: Secondary | ICD-10-CM | POA: Diagnosis not present

## 2019-05-21 DIAGNOSIS — Z800 Family history of malignant neoplasm of digestive organs: Secondary | ICD-10-CM | POA: Diagnosis not present

## 2019-05-21 DIAGNOSIS — E119 Type 2 diabetes mellitus without complications: Secondary | ICD-10-CM | POA: Diagnosis not present

## 2019-05-21 DIAGNOSIS — D509 Iron deficiency anemia, unspecified: Secondary | ICD-10-CM

## 2019-05-21 DIAGNOSIS — D649 Anemia, unspecified: Secondary | ICD-10-CM

## 2019-05-21 LAB — RETIC PANEL
Immature Retic Fract: 11 % (ref 2.3–15.9)
RBC.: 4.31 MIL/uL (ref 3.87–5.11)
Retic Count, Absolute: 44.8 10*3/uL (ref 19.0–186.0)
Retic Ct Pct: 1 % (ref 0.4–3.1)
Reticulocyte Hemoglobin: 28.2 pg (ref >27.9)

## 2019-05-21 LAB — CMP (CANCER CENTER ONLY)
ALT: 14 U/L (ref 0–44)
AST: 18 U/L (ref 15–41)
Albumin: 3.8 g/dL (ref 3.5–5.0)
Alkaline Phosphatase: 57 U/L (ref 38–126)
Anion gap: 11 (ref 5–15)
BUN: 16 mg/dL (ref 8–23)
CO2: 27 mmol/L (ref 22–32)
Calcium: 9.5 mg/dL (ref 8.9–10.3)
Chloride: 104 mmol/L (ref 98–111)
Creatinine: 1.07 mg/dL — ABNORMAL HIGH (ref 0.44–1.00)
GFR, Est AFR Am: 58 mL/min — ABNORMAL LOW (ref >60)
GFR, Estimated: 50 mL/min — ABNORMAL LOW (ref >60)
Glucose, Bld: 94 mg/dL (ref 70–99)
Potassium: 4.2 mmol/L (ref 3.5–5.1)
Sodium: 142 mmol/L (ref 135–145)
Total Bilirubin: 0.4 mg/dL (ref 0.3–1.2)
Total Protein: 8.3 g/dL — ABNORMAL HIGH (ref 6.5–8.1)

## 2019-05-21 LAB — CBC WITH DIFFERENTIAL (CANCER CENTER ONLY)
Abs Immature Granulocytes: 0.02 10*3/uL (ref 0.00–0.07)
Basophils Absolute: 0 10*3/uL (ref 0.0–0.1)
Basophils Relative: 0 %
Eosinophils Absolute: 0.2 10*3/uL (ref 0.0–0.5)
Eosinophils Relative: 3 %
HCT: 34.8 % — ABNORMAL LOW (ref 36.0–46.0)
Hemoglobin: 10.6 g/dL — ABNORMAL LOW (ref 12.0–15.0)
Immature Granulocytes: 0 %
Lymphocytes Relative: 40 %
Lymphs Abs: 3.4 10*3/uL (ref 0.7–4.0)
MCH: 24.5 pg — ABNORMAL LOW (ref 26.0–34.0)
MCHC: 30.5 g/dL (ref 30.0–36.0)
MCV: 80.6 fL (ref 80.0–100.0)
Monocytes Absolute: 0.7 10*3/uL (ref 0.1–1.0)
Monocytes Relative: 8 %
Neutro Abs: 4.3 10*3/uL (ref 1.7–7.7)
Neutrophils Relative %: 49 %
Platelet Count: 310 10*3/uL (ref 150–400)
RBC: 4.32 MIL/uL (ref 3.87–5.11)
RDW: 15.7 % — ABNORMAL HIGH (ref 11.5–15.5)
WBC Count: 8.7 10*3/uL (ref 4.0–10.5)
nRBC: 0 % (ref 0.0–0.2)

## 2019-05-21 LAB — LACTATE DEHYDROGENASE: LDH: 162 U/L (ref 98–192)

## 2019-05-21 LAB — SAVE SMEAR(SSMR), FOR PROVIDER SLIDE REVIEW

## 2019-05-21 NOTE — Progress Notes (Signed)
[place] [phone]   Fax:([phone]) [place] NOTE  Patient Care Team: Sherene Sires, DO as PCP - General (Family Medicine)  Hematological/Oncological History # Atypical Lymphocytes/Lymphocytosis 1) 09/21/2015: establish care with Dr. Alvy Bimler at the [facility]. She was referred because her labs from 2008-2017 showed white count from 5.5 to 9.3 with absolute lymphocyte count from 1.3 to 5.5 with longstanding microcytosis 2) 10/05/2015: Flow cytometry was inconclusive. Alvy Bimler recommended 1 year follow up. 3) 10/05/2015-05/21/2019: lost to follow up with the hematology service  4) 05/21/2019: establish care with Dr. Lorenso Courier   CHIEF COMPLAINTS/PURPOSE OF CONSULTATION:  "Atypical Lymphocytes "  HISTORY OF PRESENTING ILLNESS:  Amber [name] 79 y.o. female with medical history significant for DM type II, HTN, HLD, and anemia who presents for evaluation of longstanding anemia and lymphocytosis.  On review of the previous records the patient previously established care with the [facility] on 09/21/2015 at which time she was seen by Dr. Alvy Bimler.  At that time she was concerned for the patient's longstanding microcytosis and lymphocytosis.  Flow cytometry was ordered, however this was inconclusive.  It was intended for the patient to follow-up in 1 years time, however she was lost to follow-up.  On a recent PCP visit the patient was concerned that she had leukemia and requested her provider reconnect her to the cancer center.  Due to concern for the patient's lymphocytosis she was referred to hematology for further evaluation and management.  On exam today Amber [name] notes that she feels well.  She notes that she has not had any issues with fatigue, shortness of breath, or issues with low energy.  She notes that she does have some issues with sweats at night and that she has lost approximately 12 pounds this year already.  She reports that she has  been at the weight that she is at right now for the last 2 weeks.  She endorses having a Covid infection in February 2021, but happily notes that she received her vaccine yesterday.  In terms of the patient's anemia she notes that she has been told she has had a low hemoglobin since she was 79 years old.  She reports that she underwent a partial hysterectomy did not have heavy menstrual bleeding since that time.  She currently denies any overt sources of bleeding such as nosebleeds, bruising, or dark stools.  The patient is unfortunately behind on her colonoscopies as it was noted she was overdue in 2016.  She is also 3 years behind on her mammogram at this time.  In regards to family history the patient notes that her mother passed away of stomach cancer and that she has had 2 sisters and one brother who died of pancreatic cancer.  Otherwise Amber [name] denies any fevers, chills, nausea, vomiting or diarrhea.  A full 10 point ROS is listed below.  MEDICAL HISTORY:  Past Medical History:  Diagnosis Date  . Anemia   . Arthritis   . Back pain   . Blood transfusion without reported diagnosis   . CLL (chronic lymphocytic leukemia) ([place]) 09/21/2015  . Diabetes mellitus without complication ([place])   . Dizziness 12/27/2014  . H/O echocardiogram    a. 2015: echo showing a preserved EF of 60-65%, Grade 1 DD and mild MR.  Marland Kitchen History of cardiac cath    a. normal cors by cath in 03/2010  . Hyperlipidemia   . Hypertension   . Palpitations     SURGICAL  HISTORY: Past Surgical History:  Procedure Laterality Date  . ABDOMINAL HYSTERECTOMY    . COLON SURGERY      SOCIAL HISTORY: Social History   Socioeconomic History  . Marital status: Widowed    Spouse name: Not on file  . Number of children: 5  . Years of education: Not on file  . Highest education level: Not on file  Occupational History  . Not on file  Tobacco Use  . Smoking status: Never Smoker  . Smokeless tobacco: Former Network engineer  and Sexual Activity  . Alcohol use: No  . Drug use: No  . Sexual activity: Never  Other Topics Concern  . 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  5 children, lives alone.   Pt exercises by walking every other day.         Social Determinants of Health   Financial Resource Strain:   . Difficulty of Paying Living Expenses:   Food Insecurity:   . Worried About Charity fundraiser in the Last Year:   . Arboriculturist in the Last Year:   Transportation Needs:   . Film/video editor (Medical):   Marland Kitchen Lack of Transportation (Non-Medical):   Physical Activity:   . Days of Exercise per Week:   . Minutes of Exercise per Session:   Stress:   . Feeling of Stress :   Social Connections:   . Frequency of Communication with Friends and Family:   . Frequency of Social Gatherings with Friends and Family:   . Attends Religious Services:   . Active Member of Clubs or Organizations:   . Attends Archivist Meetings:   Marland Kitchen Marital Status:   Intimate Partner Violence:   . Fear of Current or Ex-Partner:   . Emotionally Abused:   Marland Kitchen Physically Abused:   . Sexually Abused:     FAMILY HISTORY: Family History  Problem Relation Age of Onset  . Pancreatic cancer Sister        3 sisters died of it  . Heart attack Father 38       Massive MI  . Diabetes Mother   . Cancer Mother   . Hypertension Son   . Congestive Heart Failure Son   . Diabetes Son   . Hypertension Daughter     ALLERGIES:  is allergic to amoxicillin; sulfamethoxazole; sulfonamide derivatives; codeine; and penicillins.  MEDICATIONS:  Current Outpatient Medications  Medication Sig Dispense Refill  . acetaminophen (TYLENOL) 500 MG tablet Take 1 tablet (500 mg total) by mouth every 6 (six) hours as needed. 30 tablet 0  . aspirin EC 81 MG tablet Take 81 mg by mouth daily.    . fluticasone (FLONASE) 50 MCG/ACT  nasal spray Place 2 sprays into both nostrils daily. 16 g 0  . isosorbide mononitrate (IMDUR) 30 MG 24 hr tablet Take 1 tablet (30 mg total) by mouth daily. 90 tablet 3  . losartan-hydrochlorothiazide (HYZAAR) 100-12.5 MG tablet Take 1 tablet by mouth daily. 90 tablet 3  . metFORMIN (GLUCOPHAGE) 500 MG tablet Take 1 tablet (500 mg total) by mouth 2 (two) times daily with a meal. 90 tablet 3  . Multiple Vitamins-Minerals (WOMENS ONE DAILY) TABS Take 1 tablet by mouth daily.    . pravastatin (PRAVACHOL) 40 MG tablet Take 1 tablet (40 mg total) by mouth daily. 90 tablet 3  .  timolol (BETIMOL) 0.5 % ophthalmic solution Place 1 drop into the right eye 2 (two) times daily. 10 mL 12  . tiZANidine (ZANAFLEX) 2 MG tablet Take 1 tablet (2 mg total) by mouth every 8 (eight) hours as needed for muscle spasms. (Patient not taking: Reported on 05/21/2019) 21 tablet 0   No current facility-administered medications for this visit.    REVIEW OF SYSTEMS:   Constitutional: ( - ) fevers, ( - )  chills , ( - ) night sweats Eyes: ( - ) blurriness of vision, ( - ) double vision, ( - ) watery eyes Ears, nose, mouth, throat, and face: ( - ) mucositis, ( - ) sore throat Respiratory: ( - ) cough, ( - ) dyspnea, ( - ) wheezes Cardiovascular: ( - ) palpitation, ( - ) chest discomfort, ( - ) lower extremity swelling Gastrointestinal:  ( - ) nausea, ( - ) heartburn, ( - ) change in bowel habits Skin: ( - ) abnormal skin rashes Lymphatics: ( - ) new lymphadenopathy, ( - ) easy bruising Neurological: ( - ) numbness, ( - ) tingling, ( - ) new weaknesses Behavioral/Psych: ( - ) mood change, ( - ) new changes  All other systems were reviewed with the patient and are negative.  PHYSICAL EXAMINATION: ECOG PERFORMANCE STATUS: 1 - Symptomatic but completely ambulatory  Vitals:   05/21/19 1358  BP: (!) 147/66  Pulse: 79  Resp: 20  Temp: 97.8 F (36.6 C)  SpO2: 98%   Filed Weights   05/21/19 1358  Weight: 191 lb 3.2 oz  (86.7 kg)    GENERAL: well appearing elderly African American female in NAD  SKIN: skin color, texture, turgor are normal, no rashes or significant lesions EYES: conjunctiva are pink and non-injected, sclera clear NECK: supple, non-tender. Enlarged thyroid.  LYMPH:  A few shoddy lymph nodes/cervical fullness on right and left side.  LUNGS: clear to auscultation and percussion with normal breathing effort HEART: regular rate & rhythm and no murmurs and no lower extremity edema Musculoskeletal: no cyanosis of digits and no clubbing  PSYCH: alert & oriented x 3, fluent speech NEURO: no focal motor/sensory deficits  LABORATORY DATA:  I have reviewed the data as listed CBC Latest Ref Rng & Units 05/11/2019 02/09/2019 03/15/2018  WBC 3.4 - 10.8 x10E3/uL 9.8 8.7 7.8  Hemoglobin 11.1 - 15.9 g/dL 10.7(L) 11.1 10.9(L)  Hematocrit 34.0 - 46.6 % 33.0(L) 34.9 35.8(L)  Platelets 150 - 450 x10E3/uL 328 332 297    CMP Latest Ref Rng & Units 05/11/2019 05/05/2018 03/15/2018  Glucose 65 - 99 mg/dL 104(H) 120(H) 91  BUN 8 - 27 mg/dL 16 12 20   Creatinine 0.57 - 1.00 mg/dL 0.94 0.88 1.32(H)  Sodium 134 - 144 mmol/L 142 144 141  Potassium 3.5 - 5.2 mmol/L 4.1 4.2 3.7  Chloride 96 - 106 mmol/L 101 102 104  CO2 20 - 29 mmol/L 26 27 25   Calcium 8.7 - 10.3 mg/dL 10.1 9.7 9.3  Total Protein 6.0 - 8.5 g/dL 7.6 7.6 -  Total Bilirubin 0.0 - 1.2 mg/dL <0.2 0.3 -  Alkaline Phos 39 - 117 IU/L 59 60 -  AST 0 - 40 IU/L 19 14 -  ALT 0 - 32 IU/L 14 12 -    PATHOLOGY: Flow Pathology Reviewed from 09/21/2015.   BLOOD FILM:  Review of the peripheral blood smear showed normal appearing white cells with neutrophils that were appropriately lobated and granulated. There was no predominance of bi-lobed or hyper-segmented  neutrophils appreciated. No Dohle bodies were noted. There was no left shifting, immature forms or blasts noted. Lymphocytes remain normal in size without any predominance of large granular lymphocytes. Rare  smudge cell noted. Red cells show no anisopoikilocytosis, macrocytes , microcytes or polychromasia. There were no schistocytes, target cells, echinocytes, acanthocytes, dacrocytes, or stomatocytes.There was no rouleaux formation, nucleated red cells, or intra-cellular inclusions noted. The platelets are normal in size, shape, and color without any clumping evident.  RADIOGRAPHIC STUDIES: US Abdomen Limited RUQ  Result Date: 05/12/2019 CLINICAL DATA:  Acute right upper quadrant abdominal pain. EXAM: ULTRASOUND ABDOMEN LIMITED RIGHT UPPER QUADRANT COMPARISON:  March 18, 2015. FINDINGS: Gallbladder: No gallstones or wall thickening visualized. No sonographic [name] sign noted by sonographer. Common bile duct: Diameter: 4 mm which is within normal limits. Liver: No focal lesion identified. Within normal limits in parenchymal echogenicity. Portal vein is patent on color Doppler imaging with normal direction of blood flow towards the liver. Other: None. IMPRESSION: No definite abnormality seen in the right upper quadrant of the abdomen. Electronically Signed   By: Marijo Conception M.D.   On: 05/12/2019 15:01    ASSESSMENT & PLAN Amber [name] 79 y.o. female with medical history significant for DM type II, HTN, HLD, and anemia who presents for evaluation of longstanding anemia and lymphocytosis.  After review of the labs, prior documentation, and discussion with the patient her findings are most consistent with an atypical lymphocytosis most likely CLL.  In order to assure this is the diagnosis today we will repeat flow cytometry and order a CLL prognostic panel.  Additionally we will review a peripheral blood film to determine if there are any smudge cells.  Of note the patient did recently have a Covid infection in February of this year.  We have been having atypical findings on peripheral blood films due to Covid infections, but literature supporting this has been spotty and sparse.  As such I would  anticipate that her lymphocytes may appear abnormal but that this may be secondary to her Covid infection rather than a longstanding hematological disorder.  In the event that the patient does have CLL it is likely that she would not require any treatment at this time.  She does not have any clear bulky lymphadenopathy, though there is some cervical fullness on physical exam.  She does not have any unexplained anemia or thrombocytopenia or any feeling of hepatosplenomegaly.  I would also notes it is reassuring that the patient has gone for 4 years without routine follow-up in hematology without any marked change in her blood counts or symptoms.  #Atypical Lymphocytes/Lymphocytosis -- today will repeat flow cytometry and with order a CLL prognostic panel -- will order CMP, CBC, LDH and review peripheral blood film -- in the event the findings are consistent with CLL I would recommend observation at this time as she has no markers indicating she would require treatment at this time -- f/u in clinic in 6 months time or sooner as indicated by above blood work.   #Microcytosis/Anemia without Iron Deficiency #Possible Beta Thalassemia  --patient has a lifelong microcytosis and mild anemia.  --will order a Hemoglobinopathy assessment to determine if there is a thalassemia --iron studies to r/o iron def anemia.  --continue to monitor   Orders Placed This Encounter  Procedures  . CBC with Differential (Cancer Center Only)    Standing Status:   Future    Standing Expiration Date:   05/20/2020  . Retic Panel  Standing Status:   Future    Standing Expiration Date:   05/20/2020  . CMP ([place]  only)    Standing Status:   Future    Standing Expiration Date:   05/20/2020  . Lactate dehydrogenase (LDH)    Standing Status:   Future    Standing Expiration Date:   05/20/2020  . Iron and TIBC    Standing Status:   Future    Standing Expiration Date:   05/20/2020  . Ferritin    Standing Status:    Future    Standing Expiration Date:   05/20/2020  . Hgb Fractionation Cascade    Standing Status:   Future    Standing Expiration Date:   05/20/2020  . Flow Cytometry    Standing Status:   Future    Standing Expiration Date:   05/20/2020  . Save Smear (SSMR)    Standing Status:   Future    Standing Expiration Date:   05/20/2020  . FISH, CLL Prognostic Panel    Standing Status:   Future    Standing Expiration Date:   05/20/2020    All questions were answered. The patient knows to call the clinic with any problems, questions or concerns.  A total of more than 60 minutes were spent on this encounter and over half of that time was spent on counseling and coordination of care as outlined above.   Ledell Peoples, MD Department of Hematology/Oncology [place] [facility] Phone: [phone] Pager: [phone] Email: [email or web address]  05/21/2019 2:42 PM

## 2019-05-22 ENCOUNTER — Other Ambulatory Visit: Payer: Self-pay | Admitting: Family Medicine

## 2019-05-24 ENCOUNTER — Other Ambulatory Visit: Payer: Self-pay | Admitting: *Deleted

## 2019-05-24 ENCOUNTER — Encounter: Payer: Self-pay | Admitting: Hematology and Oncology

## 2019-05-24 LAB — IRON AND TIBC
Iron: 33 ug/dL — ABNORMAL LOW (ref 41–142)
Saturation Ratios: 10 % — ABNORMAL LOW (ref 21–57)
TIBC: 321 ug/dL (ref 236–444)
UIBC: 289 ug/dL (ref 120–384)

## 2019-05-24 LAB — FERRITIN: Ferritin: 30 ng/mL (ref 11–307)

## 2019-05-24 LAB — FLOW CYTOMETRY

## 2019-05-24 LAB — SURGICAL PATHOLOGY

## 2019-05-25 ENCOUNTER — Telehealth: Payer: Self-pay | Admitting: Hematology and Oncology

## 2019-05-25 NOTE — Telephone Encounter (Signed)
Scheduled per los. Called and spoke with patient. Confirmed appt 

## 2019-05-26 LAB — HGB FRACTIONATION BY HPLC
Hgb A2: 1.5 % — ABNORMAL LOW (ref 1.8–3.2)
Hgb A: 96.9 % (ref 96.4–98.8)
Hgb C: 0 %
Hgb E: 0 %
Hgb F: 0.5 % (ref 0.0–2.0)
Hgb S: 0 %
Hgb Variant: 1.1 % — ABNORMAL HIGH

## 2019-05-26 LAB — HGB FRACTIONATION CASCADE

## 2019-06-18 ENCOUNTER — Telehealth: Payer: Self-pay | Admitting: Family Medicine

## 2019-06-18 ENCOUNTER — Ambulatory Visit: Payer: Medicare PPO

## 2019-06-18 NOTE — Telephone Encounter (Signed)
Patient called to cancel her appointment for today (06/18/2019) for her ear pain and numbness in mouth and just requested Dr. Criss Rosales call her. Advised patient that she may not hear from Dr. Criss Rosales today and would need to keep appointment if she needs to be seen. Patient declined keeping appointment and still asked that Dr. Criss Rosales call her.

## 2019-07-19 ENCOUNTER — Other Ambulatory Visit: Payer: Self-pay

## 2019-07-19 ENCOUNTER — Encounter: Payer: Self-pay | Admitting: Family Medicine

## 2019-07-19 ENCOUNTER — Ambulatory Visit (INDEPENDENT_AMBULATORY_CARE_PROVIDER_SITE_OTHER): Payer: Medicare PPO | Admitting: Family Medicine

## 2019-07-19 VITALS — BP 124/64 | HR 81 | Ht 62.0 in | Wt 189.8 lb

## 2019-07-19 DIAGNOSIS — E113299 Type 2 diabetes mellitus with mild nonproliferative diabetic retinopathy without macular edema, unspecified eye: Secondary | ICD-10-CM | POA: Diagnosis not present

## 2019-07-19 DIAGNOSIS — M62838 Other muscle spasm: Secondary | ICD-10-CM | POA: Diagnosis not present

## 2019-07-19 LAB — POCT GLYCOSYLATED HEMOGLOBIN (HGB A1C): HbA1c, POC (controlled diabetic range): 5.9 % (ref 0.0–7.0)

## 2019-07-19 MED ORDER — DICLOFENAC SODIUM 1 % EX GEL: 2.0000 g | Freq: Four times a day (QID) | CUTANEOUS | 1 refills | Status: DC

## 2019-07-19 MED ORDER — BACLOFEN 10 MG PO TABS: 10.0000 mg | ORAL_TABLET | Freq: Three times a day (TID) | ORAL | 0 refills | Status: DC

## 2019-07-19 NOTE — Patient Instructions (Addendum)
Today we talked about your left shoulder pain.  I prescribed some baclofen which will help with the muscle spasm part of this, I also put in some Voltaren gel.  This should start to slowly improve over the next few weeks.  Often these types of things take 2 to 6 weeks to feel better so unfortunately this will just hurt for a while.  Your A1c was 5.9 today which is very well controlled.  I think you are doing wonderful on that  As we discussed I will be leaving the practice on June 30, if you need something after that date you will have been reassigned to another one of our physicians here who will still have access to all of her notes.  Dr. Criss Rosales

## 2019-07-19 NOTE — Progress Notes (Signed)
° ° °  SUBJECTIVE:   CHIEF COMPLAINT / HPI: Left shoulder pain  Pain is more specifically in left trapezius.  Patient does not know a specific moment that it happened but is been hurting for the last couple of days.  She said it mainly just hurts if she tries to move her arm around but she has had no weakness in her arm or range of motion problems.  No new injuries or trouble feeling anything with her arm.  She said this is happened before and it took a few weeks to go a last time  PERTINENT  PMH / PSH:   OBJECTIVE:   BP 124/64    Pulse 81    Ht 5\' 2"  (1.575 m)    Wt 189 lb 12.8 oz (86.1 kg)    SpO2 98%    BMI 34.71 kg/m   General: Alert and pleasant, mentating at baseline MSK: No passive range of motion restriction to the shoulder but patient does have specific point tenderness in the mid body of the trapezius muscle on the left side.  No bony tenderness along the cervical spine, no distal perfusion/sensation/motor control deficits  ASSESSMENT/PLAN:   Muscle spasm Very specifically palpable point tenderness to left trapezius muscle.  There is significant increased tonicity, will try a short course of baclofen and Voltaren gel for pain relief.  No specific injury or moment that she knows this started, no distal sensation or perfusion deficits.  No range of motion restriction  DM type 2 with diabetic background retinopathy ([place]) Controlled with just Metformin and diet, A1c 5.9, no changes needed     [place]

## 2019-07-22 NOTE — Assessment & Plan Note (Signed)
Controlled with just Metformin and diet, A1c 5.9, no changes needed

## 2019-07-22 NOTE — Assessment & Plan Note (Signed)
Very specifically palpable point tenderness to left trapezius muscle.  There is significant increased tonicity, will try a short course of baclofen and Voltaren gel for pain relief.  No specific injury or moment that she knows this started, no distal sensation or perfusion deficits.  No range of motion restriction

## 2019-08-02 ENCOUNTER — Ambulatory Visit: Payer: Medicare PPO

## 2019-08-20 ENCOUNTER — Ambulatory Visit (INDEPENDENT_AMBULATORY_CARE_PROVIDER_SITE_OTHER): Payer: Medicare PPO | Admitting: Family Medicine

## 2019-08-20 ENCOUNTER — Ambulatory Visit (HOSPITAL_COMMUNITY)
Admission: RE | Admit: 2019-08-20 | Discharge: 2019-08-20 | Disposition: A | Discharge status: Home / Self Care | Payer: Medicare PPO | Source: Ambulatory Visit | Attending: Family Medicine | Admitting: Family Medicine

## 2019-08-20 ENCOUNTER — Encounter: Payer: Self-pay | Admitting: Family Medicine

## 2019-08-20 ENCOUNTER — Other Ambulatory Visit: Payer: Self-pay

## 2019-08-20 VITALS — BP 142/84 | HR 84 | Wt 191.2 lb

## 2019-08-20 DIAGNOSIS — I498 Other specified cardiac arrhythmias: Secondary | ICD-10-CM | POA: Insufficient documentation

## 2019-08-20 DIAGNOSIS — F320 Major depressive disorder, single episode, mild: Secondary | ICD-10-CM | POA: Diagnosis not present

## 2019-08-20 DIAGNOSIS — R079 Chest pain, unspecified: Secondary | ICD-10-CM

## 2019-08-20 DIAGNOSIS — R002 Palpitations: Secondary | ICD-10-CM | POA: Diagnosis not present

## 2019-08-20 MED ORDER — ISOSORBIDE MONONITRATE ER 30 MG PO TB24: 30.0000 mg | ORAL_TABLET | Freq: Every day | ORAL | 3 refills | Status: DC

## 2019-08-20 MED ORDER — CITALOPRAM HYDROBROMIDE 20 MG PO TABS: 20.0000 mg | ORAL_TABLET | Freq: Every day | ORAL | 3 refills | Status: DC

## 2019-08-20 NOTE — Progress Notes (Signed)
    SUBJECTIVE:   CHIEF COMPLAINT / HPI:   Heart palpitations Patient reports she feels like her heart is "fluttering" for the past 3 days.  Denies any pain, shortness of breath.  Reports that does not radiate anywhere and cannot associate it with anything.  Patient reports that she does notice that it only happens when she is sitting up and does not notice it more when she is lying down.  Patient says that she has felt this feeling in the past and had cardiac work-ups for this with no definitive findings.  Through further conversation patient does report that she notices it more when she is anxious.  Patient notices it more when she is thinking about the loss of 2 of her sons.  Anxiety/depressiom Patient reports continued anxiety and depression which she has discussed with other providers.  She says that this all stems from the loss of 2 of her sons in 2000 and 2001.  Patient reports that she is spoken with counselors in the past with some benefit but is interested in a medication at this time.  She is also interested in counseling but given her Medi care status she will have to look through the resources we provided her to find a counselor.  Denies any SI or HI.  OBJECTIVE:   BP (!) 142/84   Pulse 84   Wt 191 lb 3.2 oz (86.7 kg)   SpO2 99%   BMI 34.97 kg/m   General: Initially well-appearing, no acute distress.  Does become tearful when we discussed her anxiety and the loss of 2 of her sons. Cardiac: Regular rate and rhythm, no murmurs appreciated, no friction rub noted Respiratory: Normal work of breathing, lungs clear to auscultation bilaterally Abdomen: Soft, nontender, positive bowel sounds MSK: Patient does have tenderness to palpation ribs bilaterally.  No tenderness to palpation along sternum  ASSESSMENT/PLAN:   Heart palpitations Patient with history of heart palpitations.  Reports that her heart has "been fluttering" for 3 days.  Alleviating factors include times of anxiety.   Denies any chest pain whatsoever.  EKG today stable from previous evaluations.  Patient has been evaluated by cardiology for this in the past with no definitive diagnosis. -Started treatment for patient's anxiety -Discussed with patient strict return precautions including if the palpitations continued, chest pain, radiation anywhere that she should be seen -Consider cardiology referral for loop recorder if heart palpitations continue  Depression, major, single episode, mild ([place]) Patient reports that she has been very depressed recently.  PHQ-9 was 3 today.  Denies any SI or HI.  Reports that she is spoken with a counselor before which helped but is interested in medication today.  The depression and anxiety revolves around the death of 2 of the patient's children in 2000 and 2001. -Prescribed citalopram 20 mg daily -Provided information regarding counseling with her insurance -Strict return precautions given -Follow-up in 1 month     Gifford Shave, MD [place]

## 2019-08-20 NOTE — Assessment & Plan Note (Signed)
Patient reports that she has been very depressed recently.  PHQ-9 was 3 today.  Denies any SI or HI.  Reports that she is spoken with a counselor before which helped but is interested in medication today.  The depression and anxiety revolves around the death of 2 of the patient's children in 2000 and 2001. -Prescribed citalopram 20 mg daily -Provided information regarding counseling with her insurance -Strict return precautions given -Follow-up in 1 month

## 2019-08-20 NOTE — Patient Instructions (Signed)
It was a pleasure to meet you today!  I am sorry you are having these issues with the heart fluttering.  We did an EKG on you today which was completely normal.  I feel like this is most likely related to your anxiety and would like to start you on a medication for that called Celexa.  You have taken this in the past.  I have sent the prescription to your pharmacy.  Regarding counseling unfortunately because of Medicare our counselor cannot see you.  Below is a list of the counselors that are available.  I would like to follow-up with you in 1 month to see how you are anxiety is doing as well as check on your heart fluttering.  If it continues I may need to send you back to the cardiologist for them to evaluate you and possibly give you something to record your heart rhythm over an extended period of time.  I hope you have a wonderful afternoon!   Therapy and Counseling Resources Most providers on this list will take Medicaid. Patients with commercial insurance or Medicare should contact their insurance company to get a list of in network providers.  [facility] [facility]  [address] [place], [place] [address]      [facility] [address]  [place], [place] [address] [phone]  [facility] [address] [place]  [place], [place] [address]       [phone]      Jinny Blossom [facility] [address] [place], [place]  Family [facility]:  [place]. [place] [facility]  Journeys Counseling:  [address] Loni Muse, [place]  [facility] (under & uninsured) [address] [place] [phone]    [email or web address]    [facility] [place]     Phone:  [phone]     Irmo Orovada  [place] [address]       [place], [place] ext [place]: [place], [place], [place]   [place] ([place] therapist) [address] [place] [address]   [place] [address]    [phone]    [facility]   [address],  [place], [place]   [facility] [place]  [facility]  [address] [place], [place]        [phone]  Open Access/Walk In Clinic under & uninsured  [place], To schedule an appointment call [phone] [address] [place] [phone]):  Fay Records from 8 AM - 3 PM Moving June 1 to [employer] at [facility] [address] [place],  ([place])   [place], [place]: [phone]) 8:30 - 12; 1 - 2:30  [facility] the [place],  [place], [place]    ([phone]):8:30 - 12; 2 - 3PM  [facility],  [address]  [place]; [phone]):   Mon - Fri 8 AM - 5 PM  [facility] [place]  MWF 12:30 to 3:00 or call to schedule an appointment  [phone]  Specific  Provider options Psychology Today  https://[email or web address].com/us 1. click on find a therapist  2. enter your zip code 3. left side and select or tailor a therapist for your specific need.   [facility] Provider Directory [email or web address]  (Medicaid)   Follow all drop down to find a provider  [place] [phone] or [email or web address] [address], [place], [place] Recovery support and educational   24- Hour Availability:  .  Marland Kitchen [facility]  . [place], [place] Crisis [phone]  . Family Service of the [employer] [phone]  [facility] Crisis Service  [phone]    . [place]  [phone] (after hours)  . Therapeutic Alternative/Mobile Crisis   [phone]  . [place] National Suicide Hotline  442-390-6336 ([place])  . Call 911 or go to emergency room  . [employer]  [phone]);  [place] and [employer]   . [facility] ACCESS  [phone]); [place], [place], [place], [place], [place], [place], [place]

## 2019-08-20 NOTE — Assessment & Plan Note (Signed)
Patient with history of heart palpitations.  Reports that her heart has "been fluttering" for 3 days.  Alleviating factors include times of anxiety.  Denies any chest pain whatsoever.  EKG today stable from previous evaluations.  Patient has been evaluated by cardiology for this in the past with no definitive diagnosis. -Started treatment for patient's anxiety -Discussed with patient strict return precautions including if the palpitations continued, chest pain, radiation anywhere that she should be seen -Consider cardiology referral for loop recorder if heart palpitations continue

## 2019-08-26 ENCOUNTER — Other Ambulatory Visit: Payer: Self-pay

## 2019-08-26 DIAGNOSIS — I10 Essential (primary) hypertension: Secondary | ICD-10-CM

## 2019-08-26 DIAGNOSIS — R079 Chest pain, unspecified: Secondary | ICD-10-CM

## 2019-08-26 DIAGNOSIS — F320 Major depressive disorder, single episode, mild: Secondary | ICD-10-CM

## 2019-08-26 DIAGNOSIS — M62838 Other muscle spasm: Secondary | ICD-10-CM

## 2019-08-26 DIAGNOSIS — E113299 Type 2 diabetes mellitus with mild nonproliferative diabetic retinopathy without macular edema, unspecified eye: Secondary | ICD-10-CM

## 2019-08-26 MED ORDER — DICLOFENAC SODIUM 1 % EX GEL: 2.0000 g | Freq: Four times a day (QID) | CUTANEOUS | 1 refills | Status: DC

## 2019-08-26 MED ORDER — ISOSORBIDE MONONITRATE ER 30 MG PO TB24: 30.0000 mg | ORAL_TABLET | Freq: Every day | ORAL | 3 refills | Status: DC

## 2019-08-26 MED ORDER — CITALOPRAM HYDROBROMIDE 20 MG PO TABS: 20.0000 mg | ORAL_TABLET | Freq: Every day | ORAL | 3 refills | Status: DC

## 2019-08-26 MED ORDER — METFORMIN HCL 500 MG PO TABS: 500.0000 mg | ORAL_TABLET | Freq: Two times a day (BID) | ORAL | 3 refills | Status: DC

## 2019-08-26 MED ORDER — ASPIRIN EC 81 MG PO TBEC: 81.0000 mg | DELAYED_RELEASE_TABLET | Freq: Every day | ORAL | 4 refills | Status: DC

## 2019-08-26 MED ORDER — LOSARTAN POTASSIUM-HCTZ 100-12.5 MG PO TABS: 1.0000 | ORAL_TABLET | Freq: Every day | ORAL | 3 refills | Status: DC

## 2019-08-26 NOTE — Telephone Encounter (Signed)
Rx request for the following from [facility]. Did not see on med list:  Tru Metrix Blood Glucose Mtr Humana True Metrix test strips Trueplus Ultra Thin 30G lancet  Ottis Stain, CMA

## 2019-08-27 MED ORDER — RELION TRUE METRIX TEST STRIPS VI STRP: ORAL_STRIP | 12 refills | Status: DC

## 2019-08-27 MED ORDER — KROGER LANCETS ULTRATHIN 30G MISC: 1.0000 [IU] | Freq: Every day | 12 refills | Status: DC

## 2019-08-27 MED ORDER — TRUE METRIX METER DEVI: 1.0000 | Freq: Every day | 0 refills | Status: DC

## 2019-08-27 NOTE — Addendum Note (Signed)
Addended by: Concepcion Living on: 08/27/2019 01:36 PM   Modules accepted: Orders

## 2019-08-30 ENCOUNTER — Telehealth: Payer: Self-pay | Admitting: *Deleted

## 2019-08-30 NOTE — Telephone Encounter (Signed)
Received fax from [facility] requesting a refill on:   BD dingle use swab  Latanoprost 0.005% eye drops  Did not see either of these on current med list.Amber [name], CMA

## 2019-09-01 MED ORDER — LOSARTAN POTASSIUM-HCTZ 100-12.5 MG PO TABS: 1.0000 | ORAL_TABLET | Freq: Every day | ORAL | 3 refills | Status: DC

## 2019-09-01 MED ORDER — ISOSORBIDE MONONITRATE ER 30 MG PO TB24: 30.0000 mg | ORAL_TABLET | Freq: Every day | ORAL | 3 refills | Status: DC

## 2019-09-01 MED ORDER — CITALOPRAM HYDROBROMIDE 20 MG PO TABS
20.0000 mg | ORAL_TABLET | Freq: Every day | ORAL | 3 refills | Status: DC
Start: 2019-09-01 — End: 2019-12-10

## 2019-09-01 NOTE — Telephone Encounter (Signed)
Received phone call from [place] at [facility], regarding rx refills. Upon chart review, medications were sent to [facility]. Called and cancelled celexa, imdur, and hyzaar at [facility]. Resending to [place]. Due to small amount of supply of metformin, patient has elected to get filled at [facility] for now.   Talbot Grumbling, RN

## 2019-09-01 NOTE — Addendum Note (Signed)
Addended by: Talbot Grumbling on: 09/01/2019 11:35 AM   Modules accepted: Orders

## 2019-09-02 ENCOUNTER — Telehealth: Payer: Self-pay | Admitting: *Deleted

## 2019-09-02 NOTE — Telephone Encounter (Signed)
Fax requested that these Rx's be sent to [facility].  It appears that they have been [email or web address]  [place]. Tate Zagal Zimmerman Rumple, CMA   True Metrix blood glucose monitor, Humana true metrix test strip Trueplus utlra thin 30g lancet BD single use swab Latanoprost- (did not see on current list) Metformin Diclofenac aspirin

## 2019-10-05 ENCOUNTER — Other Ambulatory Visit: Payer: Self-pay | Admitting: *Deleted

## 2019-10-05 MED ORDER — PRAVASTATIN SODIUM 40 MG PO TABS: 40.0000 mg | ORAL_TABLET | Freq: Every day | ORAL | 0 refills | Status: DC

## 2019-10-27 ENCOUNTER — Ambulatory Visit: Payer: Medicare PPO

## 2019-10-27 DIAGNOSIS — H3561 Retinal hemorrhage, right eye: Secondary | ICD-10-CM | POA: Diagnosis not present

## 2019-10-27 DIAGNOSIS — H40013 Open angle with borderline findings, low risk, bilateral: Secondary | ICD-10-CM | POA: Diagnosis not present

## 2019-10-27 DIAGNOSIS — H25811 Combined forms of age-related cataract, right eye: Secondary | ICD-10-CM | POA: Diagnosis not present

## 2019-10-27 DIAGNOSIS — H524 Presbyopia: Secondary | ICD-10-CM | POA: Diagnosis not present

## 2019-10-27 DIAGNOSIS — H5203 Hypermetropia, bilateral: Secondary | ICD-10-CM | POA: Diagnosis not present

## 2019-10-27 DIAGNOSIS — H2512 Age-related nuclear cataract, left eye: Secondary | ICD-10-CM | POA: Diagnosis not present

## 2019-11-02 ENCOUNTER — Ambulatory Visit: Payer: Medicare PPO

## 2019-11-02 ENCOUNTER — Ambulatory Visit: Payer: Medicare PPO | Admitting: Family Medicine

## 2019-11-03 ENCOUNTER — Encounter: Payer: Self-pay | Admitting: Family Medicine

## 2019-11-03 ENCOUNTER — Ambulatory Visit (INDEPENDENT_AMBULATORY_CARE_PROVIDER_SITE_OTHER): Payer: Medicare PPO | Admitting: Family Medicine

## 2019-11-03 ENCOUNTER — Other Ambulatory Visit: Payer: Self-pay

## 2019-11-03 VITALS — BP 130/80 | HR 84 | Wt 190.8 lb

## 2019-11-03 DIAGNOSIS — S46001A Unspecified injury of muscle(s) and tendon(s) of the rotator cuff of right shoulder, initial encounter: Secondary | ICD-10-CM

## 2019-11-03 DIAGNOSIS — M67911 Unspecified disorder of synovium and tendon, right shoulder: Secondary | ICD-10-CM | POA: Diagnosis not present

## 2019-11-03 NOTE — Progress Notes (Signed)
    SUBJECTIVE:   CHIEF COMPLAINT / HPI:   Right shoulder blade pain:   2-3 weeks of worsening intermittent pain near the medial aspect of her right scapula.  She takes 500mg  tylenol every other day. She is not putting any topicals on it. She sweeps/mops and lifts things around the house when cleaning but otherwise is not doing any repetitive motions involving the shoulders No chest pain or SOB.      PERTINENT  PMH / PSH: back pain, hip pain  OBJECTIVE:   BP 130/80   Pulse 84   Wt 190 lb 12.8 oz (86.5 kg)   SpO2 97%   BMI 34.90 kg/m   Gen: alert. Oriented.  Elderly female appears stated age.  CV: RRR. No murmurs Pulm: LCTAB MSK: no TTP of the shoulder joint.  TTP of the medial posteior scapula bilaterally.  Hawkins test negative on right side. Empty can test positive.  Pain with external shoulder rotation.  No pain with internal rotation.    ASSESSMENT/PLAN:   Rotator cuff injury Appears to be infraspinatus injury based on physical exam.   - DG R shoulder - amb ref PT - apply voltaren gel to affected area as needed.  - tylenol as needed.      Benay Pike, MD [place]

## 2019-11-03 NOTE — Patient Instructions (Addendum)
It was nice to meet you today,  I think your pain is coming from a rotator cuff injury, most likely your infraspinatus muscle.  I have sent you a referral to physical therapy so they can help you with exercises and stretching techniques.  For the pain I would like you to rub the Voltaren gel on the area daily.  You can also increase how often you take your Tylenol if necessary.  I will get an x-ray of your shoulder to rule out other things such as arthritis.  You can go to the [facility] imaging center at any time to get this done.  Please follow-up with your PCP in 1 month.  Have a great day,  Clemetine Marker, MD

## 2019-11-06 DIAGNOSIS — S46009A Unspecified injury of muscle(s) and tendon(s) of the rotator cuff of unspecified shoulder, initial encounter: Secondary | ICD-10-CM | POA: Insufficient documentation

## 2019-11-06 NOTE — Assessment & Plan Note (Signed)
Appears to be infraspinatus injury based on physical exam.   - DG R shoulder - amb ref PT - apply voltaren gel to affected area as needed.  - tylenol as needed.

## 2019-11-18 ENCOUNTER — Other Ambulatory Visit: Payer: Self-pay | Admitting: Hematology and Oncology

## 2019-11-18 DIAGNOSIS — D72820 Lymphocytosis (symptomatic): Secondary | ICD-10-CM

## 2019-11-18 NOTE — Progress Notes (Deleted)
[place] Telephone:([phone]) [phone]   [phone]  PROGRESS NOTE  Patient Care Team: Gifford Shave, MD as PCP - General (Family Medicine)  Hematological/Oncological History # Atypical Lymphocytes/Lymphocytosis 1) 09/21/2015: establish care with Dr. Alvy Bimler at the [facility]. She was referred because her labs from 2008-2017 showed white count from 5.5 to9.3 with absolute lymphocyte count from 1.3to5.5 with longstanding microcytosis 2) 10/05/2015: Flow cytometry was inconclusive. Alvy Bimler recommended 1 year follow up. 3) 10/05/2015-05/21/2019: lost to follow up with the hematology service  4) 05/21/2019: establish care with Dr. Lorenso Courier   Interval History:  Amber [name] 79 y.o. female with medical history significant for atypical lymphocytosis who presents for a follow up visit. The patient's last visit was on 05/21/2019 at which time she established care. In the interim since the last visit the patient has had no hospitalizations or ED visits.   On exam today ***  MEDICAL HISTORY:  Past Medical History:  Diagnosis Date  . Anemia   . Arthritis   . Back pain   . Blood transfusion without reported diagnosis   . CLL (chronic lymphocytic leukemia) ([place]) 09/21/2015  . Diabetes mellitus without complication ([place])   . Dizziness 12/27/2014  . H/O echocardiogram    a. 2015: echo showing a preserved EF of 60-65%, Grade 1 DD and mild MR.  Marland Kitchen History of cardiac cath    a. normal cors by cath in 03/2010  . Hyperlipidemia   . Hypertension   . Palpitations     SURGICAL HISTORY: Past Surgical History:  Procedure Laterality Date  . ABDOMINAL HYSTERECTOMY    . COLON SURGERY      SOCIAL HISTORY: Social History   Socioeconomic History  . Marital status: Widowed    Spouse name: Not on file  . Number of children: 5  . Years of education: Not on file  . Highest education level: Not on file  Occupational History  . Not on file  Tobacco Use  . Smoking status: Never  Smoker  . Smokeless tobacco: Former Network engineer  . Vaping Use: Never used  Substance and Sexual Activity  . Alcohol use: No  . Drug use: No  . Sexual activity: Never  Other Topics Concern  . 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  5 children, lives alone.   Pt exercises by walking every other day.         Social Determinants of Health   Financial Resource Strain:   . Difficulty of Paying Living Expenses: Not on file  Food Insecurity:   . Worried About Charity fundraiser in the Last Year: Not on file  . Ran Out of Food in the Last Year: Not on file  Transportation Needs:   . Lack of Transportation (Medical): Not on file  . Lack of Transportation (Non-Medical): Not on file  Physical Activity:   . Days of Exercise per Week: Not on file  . Minutes of Exercise per Session: Not on file  Stress:   . Feeling of Stress : Not on file  Social Connections:   . Frequency of Communication with Friends and Family: Not on file  . Frequency of Social Gatherings with Friends and Family: Not on file  . Attends Religious Services: Not on file  . Active Member of Clubs or Organizations: Not on file  . Attends Archivist Meetings: Not  on file  . Marital Status: Not on file  Intimate Partner Violence:   . Fear of Current or Ex-Partner: Not on file  . Emotionally Abused: Not on file  . Physically Abused: Not on file  . Sexually Abused: Not on file    FAMILY HISTORY: Family History  Problem Relation Age of Onset  . Pancreatic cancer Sister        3 sisters died of it  . Heart attack Father 50       Massive MI  . Diabetes Mother   . Cancer Mother   . Hypertension Son   . Congestive Heart Failure Son   . Diabetes Son   . Hypertension Daughter     ALLERGIES:  is allergic to amoxicillin, sulfamethoxazole, sulfonamide derivatives, codeine, and  penicillins.  MEDICATIONS:  Current Outpatient Medications  Medication Sig Dispense Refill  . acetaminophen (TYLENOL) 500 MG tablet Take 1 tablet (500 mg total) by mouth every 6 (six) hours as needed. 30 tablet 0  . aspirin EC 81 MG tablet Take 1 tablet (81 mg total) by mouth daily. 30 tablet 4  . baclofen (LIORESAL) 10 MG tablet Take 1 tablet (10 mg total) by mouth 3 (three) times daily. 30 each 0  . Blood Glucose Monitoring Suppl (TRUE METRIX METER) DEVI 1 Device by Does not apply route daily. 1 each 0  . citalopram (CELEXA) 20 MG tablet Take 1 tablet (20 mg total) by mouth daily. 30 tablet 3  . diclofenac Sodium (VOLTAREN) 1 % GEL Apply 2 g topically 4 (four) times daily. 100 g 1  . fluticasone (FLONASE) 50 MCG/ACT nasal spray Place 2 sprays into both nostrils daily. 16 g 0  . glucose blood (RELION TRUE METRIX TEST STRIPS) test strip Use as instructed 100 each 12  . isosorbide mononitrate (IMDUR) 30 MG 24 hr tablet Take 1 tablet (30 mg total) by mouth daily. 90 tablet 3  . [employer] Lancets UltraThin 30G MISC 1 Units by Does not apply route daily. 1 each 12  . losartan-hydrochlorothiazide (HYZAAR) 100-12.5 MG tablet Take 1 tablet by mouth daily. 90 tablet 3  . metFORMIN (GLUCOPHAGE) 500 MG tablet Take 1 tablet (500 mg total) by mouth 2 (two) times daily with a meal. 90 tablet 3  . Multiple Vitamins-Minerals (WOMENS ONE DAILY) TABS Take 1 tablet by mouth daily.    . pravastatin (PRAVACHOL) 40 MG tablet Take 1 tablet (40 mg total) by mouth daily. 90 tablet 0  . timolol (BETIMOL) 0.5 % ophthalmic solution Place 1 drop into the right eye 2 (two) times daily. 10 mL 12   No current facility-administered medications for this visit.    REVIEW OF SYSTEMS:   Constitutional: ( - ) fevers, ( - )  chills , ( - ) night sweats Eyes: ( - ) blurriness of vision, ( - ) double vision, ( - ) watery eyes Ears, nose, mouth, throat, and face: ( - ) mucositis, ( - ) sore throat Respiratory: ( - ) cough, ( - )  dyspnea, ( - ) wheezes Cardiovascular: ( - ) palpitation, ( - ) chest discomfort, ( - ) lower extremity swelling Gastrointestinal:  ( - ) nausea, ( - ) heartburn, ( - ) change in bowel habits Skin: ( - ) abnormal skin rashes Lymphatics: ( - ) new lymphadenopathy, ( - ) easy bruising Neurological: ( - ) numbness, ( - ) tingling, ( - ) new weaknesses Behavioral/Psych: ( - ) mood change, ( - ) new changes  All other systems were reviewed with the patient and are negative.  PHYSICAL EXAMINATION: ECOG PERFORMANCE STATUS: {CHL ONC ECOG PS:[phone]}  There were no vitals filed for this visit. There were no vitals filed for this visit.  GENERAL: alert, no distress and comfortable SKIN: skin color, texture, turgor are normal, no rashes or significant lesions EYES: conjunctiva are pink and non-injected, sclera clear OROPHARYNX: no exudate, no erythema; lips, buccal mucosa, and tongue normal  NECK: supple, non-tender LYMPH:  no palpable lymphadenopathy in the cervical, axillary or inguinal LUNGS: clear to auscultation and percussion with normal breathing effort HEART: regular rate & rhythm and no murmurs and no lower extremity edema ABDOMEN: soft, non-tender, non-distended, normal bowel sounds Musculoskeletal: no cyanosis of digits and no clubbing  PSYCH: alert & oriented x 3, fluent speech NEURO: no focal motor/sensory deficits  LABORATORY DATA:  I have reviewed the data as listed CBC Latest Ref Rng & Units 05/21/2019 05/11/2019 02/09/2019  WBC 4.0 - 10.5 K/uL 8.7 9.8 8.7  Hemoglobin 12.0 - 15.0 g/dL 10.6(L) 10.7(L) 11.1  Hematocrit 36 - 46 % 34.8(L) 33.0(L) 34.9  Platelets 150 - 400 K/uL 310 328 332    CMP Latest Ref Rng & Units 05/21/2019 05/11/2019 05/05/2018  Glucose 70 - 99 mg/dL 94 104(H) 120(H)  BUN 8 - 23 mg/dL 16 16 12   Creatinine 0.44 - 1.00 mg/dL 1.07(H) 0.94 0.88  Sodium 135 - 145 mmol/L 142 142 144  Potassium 3.5 - 5.1 mmol/L 4.2 4.1 4.2  Chloride 98 - 111 mmol/L 104 101 102   CO2 22 - 32 mmol/L 27 26 27   Calcium 8.9 - 10.3 mg/dL 9.5 10.1 9.7  Total Protein 6.5 - 8.1 g/dL 8.3(H) 7.6 7.6  Total Bilirubin 0.3 - 1.2 mg/dL 0.4 <0.2 0.3  Alkaline Phos 38 - 126 U/L 57 59 60  AST 15 - 41 U/L 18 19 14   ALT 0 - 44 U/L 14 14 12     No results found for: MPROTEIN No results found for: KPAFRELGTCHN, LAMBDASER, KAPLAMBRATIO   BLOOD FILM: *** Review of the peripheral blood smear showed normal appearing white cells with neutrophils that were appropriately lobated and granulated. There was no predominance of bi-lobed or hyper-segmented neutrophils appreciated. No Dohle bodies were noted. There was no left shifting, immature forms or blasts noted. Lymphocytes remain normal in size without any predominance of large granular lymphocytes. Red cells show no anisopoikilocytosis, macrocytes , microcytes or polychromasia. There were no schistocytes, target cells, echinocytes, acanthocytes, dacrocytes, or stomatocytes.There was no rouleaux formation, nucleated red cells, or intra-cellular inclusions noted. The platelets are normal in size, shape, and color without any clumping evident.  RADIOGRAPHIC STUDIES: I have personally reviewed the radiological images as listed and agreed with the findings in the report. No results found.  ASSESSMENT & PLAN Amber [name] 79 y.o. female with medical history significant for atypical lymphocytosis who presents for a follow up visit. ***  Amber [name] 80 y.o. female with medical history significant for DM type II, HTN, HLD, and anemia who presents for evaluation of longstanding anemia and lymphocytosis.  After review of the labs, prior documentation, and discussion with the patient her findings are most consistent with an atypical lymphocytosis most likely CLL.  In order to assure this is the diagnosis today we will repeat flow cytometry and order a CLL prognostic panel.  Additionally we will review a peripheral blood film to determine if  there are any smudge cells.  Of note the patient did have  a Covid infection in February of this year.  We have been having atypical findings on peripheral blood films due to Covid infections, but literature supporting this has been spotty and sparse.  As such I would anticipate that her lymphocytes may appear abnormal but that this may be secondary to her Covid infection rather than a longstanding hematological disorder.  In the event that the patient does have CLL it is likely that she would not require any treatment at this time.  She does not have any clear bulky lymphadenopathy, though there is some cervical fullness on physical exam.  She does not have any unexplained anemia or thrombocytopenia or any feeling of hepatosplenomegaly.  I would also notes it is reassuring that the patient has gone for 4 years without routine follow-up in hematology without any marked change in her blood counts or symptoms.   #Atypical Lymphocytes/Lymphocytosis -- will order repeat CMP, CBC, LDH and review peripheral blood film today -- *** -- f/u in clinic in 6 months time or sooner as indicated by above blood work.   #Microcytosis/Anemia without Iron Deficiency #Possible Beta Thalassemia  --patient has a lifelong microcytosis and mild anemia.  --Hemoglobinopathy assessment showed a Hgb A2' variant --iron studies showed borderline low iron  --continue to monitor   No orders of the defined types were placed in this encounter.   All questions were answered. The patient knows to call the clinic with any problems, questions or concerns.  A total of more than 30 minutes were spent on this encounter and over half of that time was spent on counseling and coordination of care as outlined above.   Ledell Peoples, MD Department of Hematology/Oncology [place] at [facility] Phone: [phone] Pager: [phone] Email: [email or web address]  11/18/2019 4:13 PM

## 2019-11-19 ENCOUNTER — Other Ambulatory Visit: Payer: Medicare PPO

## 2019-11-19 ENCOUNTER — Ambulatory Visit: Payer: Medicare PPO | Admitting: Hematology and Oncology

## 2019-12-02 ENCOUNTER — Ambulatory Visit (INDEPENDENT_AMBULATORY_CARE_PROVIDER_SITE_OTHER): Payer: Medicare PPO | Admitting: Family Medicine

## 2019-12-02 ENCOUNTER — Encounter: Payer: Self-pay | Admitting: Family Medicine

## 2019-12-02 ENCOUNTER — Other Ambulatory Visit: Payer: Self-pay

## 2019-12-02 VITALS — BP 142/80 | HR 71 | Ht 62.0 in | Wt 190.8 lb

## 2019-12-02 DIAGNOSIS — E113299 Type 2 diabetes mellitus with mild nonproliferative diabetic retinopathy without macular edema, unspecified eye: Secondary | ICD-10-CM | POA: Diagnosis not present

## 2019-12-02 DIAGNOSIS — Z23 Encounter for immunization: Secondary | ICD-10-CM

## 2019-12-02 DIAGNOSIS — F320 Major depressive disorder, single episode, mild: Secondary | ICD-10-CM | POA: Diagnosis not present

## 2019-12-02 DIAGNOSIS — S46001A Unspecified injury of muscle(s) and tendon(s) of the rotator cuff of right shoulder, initial encounter: Secondary | ICD-10-CM

## 2019-12-02 LAB — POCT GLYCOSYLATED HEMOGLOBIN (HGB A1C): Hemoglobin A1C: 5.9 % — AB (ref 4.0–5.6)

## 2019-12-02 NOTE — Patient Instructions (Signed)
It was wonderful to see you today!  I am glad you are doing so well and that you are no longer having shoulder pain.  Your depression is also doing very well and I want to continue on the same dose of medication.  Regarding your diabetes it is also doing well and your hemoglobin A1c was 5.9 today which is the same as it was at the previous check.  If you have any questions or concerns please feel free to call our clinic but if not we will see you in 3 months for a diabetes checkup.

## 2019-12-02 NOTE — Assessment & Plan Note (Signed)
Injury has resolved.  No complaints at this time, full range of motion and strength in shoulders bilaterally. -Follow-up as needed

## 2019-12-02 NOTE — Progress Notes (Signed)
    SUBJECTIVE:   CHIEF COMPLAINT / HPI:   Diabetes  Blood glucoses ranging between 101 and 10 7 in the morning.  Reports that she feels her blood sugars have been doing well and has no concerns at this time.  Shoulder pain  Patient reports her shoulder pain completely resolved after being seen by Dr. Jeannine Kitten.  She has not had issues with it since then.  Reports full range of motion.  Depression  Patient reports that her depression has been doing well since starting the Celexa.  She feels like it gives her a little extra boost and has no complaints.  PHQ-9 today was 3.  Denies any SI or HI.   OBJECTIVE:   BP (!) 142/80   Pulse 71   Ht 5\' 2"  (1.575 m)   Wt 190 lb 12.8 oz (86.5 kg)   SpO2 97%   BMI 34.90 kg/m   General: Well-appearing 79 year old, looks younger than age, in good spirits Cardiac: Regular rate and rhythm, no murmurs appreciated Respiratory: Normal work of breathing, lungs clear to auscultation bilaterally Abdomen: Soft, nontender, positive bowel sounds MSK: Shoulders nontender bilaterally with full range of motion and strength bilaterally  Diabetic Foot Exam - Simple   Simple Foot Form Diabetic Foot exam was performed with the following findings: Yes 12/02/2019  1:59 PM  Visual Inspection No deformities, no ulcerations, no other skin breakdown bilaterally: Yes Sensation Testing Intact to touch and monofilament testing bilaterally: Yes Pulse Check Posterior Tibialis and Dorsalis pulse intact bilaterally: Yes Comments     ASSESSMENT/PLAN:   Rotator cuff injury Injury has resolved.  No complaints at this time, full range of motion and strength in shoulders bilaterally. -Follow-up as needed  DM type 2 with diabetic background retinopathy ([place]) Well-controlled at this time.  Is on Metformin as well as dietary changes.  Hemoglobin A1c 5.9 today with previous being 5.9.  No changes at this time. -Continue Metformin as prescribed -Follow-up in 3  months  Depression, major, single episode, mild ([place]) Patient reports her depression is doing very well at this time.  PHQ-9 was 3 today.  It was 3 at previous visit.  Denies any SI or HI.  Feels like she is getting great benefit from the citalopram 20 mg daily -Continue citalopram 20 mg daily -Follow PHQ-9's at visits     Gifford Shave, MD [place]

## 2019-12-02 NOTE — Assessment & Plan Note (Signed)
Patient reports her depression is doing very well at this time.  PHQ-9 was 3 today.  It was 3 at previous visit.  Denies any SI or HI.  Feels like she is getting great benefit from the citalopram 20 mg daily -Continue citalopram 20 mg daily -Follow PHQ-9's at visits

## 2019-12-02 NOTE — Assessment & Plan Note (Signed)
Well-controlled at this time.  Is on Metformin as well as dietary changes.  Hemoglobin A1c 5.9 today with previous being 5.9.  No changes at this time. -Continue Metformin as prescribed -Follow-up in 3 months

## 2019-12-10 ENCOUNTER — Other Ambulatory Visit: Payer: Self-pay | Admitting: *Deleted

## 2019-12-10 DIAGNOSIS — E113299 Type 2 diabetes mellitus with mild nonproliferative diabetic retinopathy without macular edema, unspecified eye: Secondary | ICD-10-CM

## 2019-12-10 DIAGNOSIS — F320 Major depressive disorder, single episode, mild: Secondary | ICD-10-CM

## 2019-12-10 MED ORDER — CITALOPRAM HYDROBROMIDE 20 MG PO TABS: 20.0000 mg | ORAL_TABLET | Freq: Every day | ORAL | 3 refills | Status: DC

## 2019-12-10 MED ORDER — PRAVASTATIN SODIUM 40 MG PO TABS
40.0000 mg | ORAL_TABLET | Freq: Every day | ORAL | 0 refills | Status: DC
Start: 2019-12-10 — End: 2020-01-24

## 2019-12-10 MED ORDER — METFORMIN HCL 500 MG PO TABS: 500.0000 mg | ORAL_TABLET | Freq: Two times a day (BID) | ORAL | 3 refills | Status: DC

## 2019-12-13 ENCOUNTER — Encounter: Payer: Self-pay | Admitting: Family Medicine

## 2019-12-13 ENCOUNTER — Other Ambulatory Visit: Payer: Self-pay

## 2019-12-13 ENCOUNTER — Ambulatory Visit (INDEPENDENT_AMBULATORY_CARE_PROVIDER_SITE_OTHER): Payer: Medicare PPO | Admitting: Family Medicine

## 2019-12-13 VITALS — BP 146/80 | HR 98 | Ht 62.0 in | Wt 188.6 lb

## 2019-12-13 DIAGNOSIS — M25512 Pain in left shoulder: Secondary | ICD-10-CM

## 2019-12-13 DIAGNOSIS — R202 Paresthesia of skin: Secondary | ICD-10-CM | POA: Diagnosis not present

## 2019-12-13 DIAGNOSIS — G8929 Other chronic pain: Secondary | ICD-10-CM | POA: Insufficient documentation

## 2019-12-13 NOTE — Progress Notes (Signed)
    SUBJECTIVE:   CHIEF COMPLAINT / HPI:   Left shoulder pain Initially seen by Dr. Jeannine Kitten on 9/29 Was diagnosed with rotator cuff pathology on right, specifically in the infraspinatus She was referred to PT and advised to use Voltaren gel as needed for pain When seen on 10/28 by PCP, reported that pain had resolved Pain came back yesterday, feels it mostly in the posterior back/shoulder blade on the left She never went to PT Has a history of gastric ulcer  Lip Tingling Starting this morning for about 10 min Hasn't had before States that she was very nervous because of her pain No difficulty speaking, thinks her lips were a little swollen, denies shortness of breath Resolved on its own after 10 min Denies history of anaphylaxis    PERTINENT  PMH / PSH: Reported history of gastric ulcer, hypertension, T2DM, HLD, OA, IDA, MVP, Depression  OBJECTIVE:   BP (!) 146/80   Pulse 98   Ht 5\' 2"  (1.575 m)   Wt 188 lb 9.6 oz (85.5 kg)   SpO2 93%   BMI 34.50 kg/m    Physical Exam:  General: 79 y.o. female in NAD HEENT: EOMI, right pupil reactive to light with slightly larger diameter than left, left reactive to light, throat clear, no swelling noted Cardio: RRR  Lungs: CTAB, no wheezing, no rhonchi, no crackles, no IWOB on RA Skin: warm and dry Neuro: CN II through XII grossly intact, sensation intact throughout bilateral upper and lower extremities, 5/5 strength BUE/BLE  Limited Left Shoulder: Inspection reveals no obvious deformity, atrophy, or asymmetry. No bruising. No swelling b/l TTP left rhomboids Full ROM in flexion, abduction, internal/external rotation b/l NV intact distally b/l Special Tests:  -Negative Michel Bickers, no painful arc   ASSESSMENT/PLAN:   Chronic left shoulder pain Appears to be having pain in the periscapular musculature, specifically the rhomboids on the left side.  Discussed with patient that physical therapy would be most helpful for her, but  she states she is unable to leave her home safely without assistance and would have to have a ride, which is very difficult for her to obtain.  Home health PT would be the best option for her.  Referral placed.  Given that she has a history of gastric ulcers, would not prescribe NSAIDs at this time.  She can continue to use Tylenol, Voltaren gel, and can also add heat.  Follow-up in 4 weeks with her PCP.  Paresthesia of lip She is neurologically intact on examination and does not appear to be having any breathing difficulty.  She does not appear to have any swelling in her throat.  Lung exam is normal.  Very unlikely that she would have had a TIA or anaphylaxis.  She is on an ARB, but does not seem to have true symptoms of angioedema given lack of swelling.  It is most likely that this was secondary to a stress reaction or hyperventilation.  She was advised to follow-up immediately if this occurs again, especially if it does not resolve in a few moments on its own.     Cleophas Dunker, [place]

## 2019-12-13 NOTE — Assessment & Plan Note (Signed)
She is neurologically intact on examination and does not appear to be having any breathing difficulty.  She does not appear to have any swelling in her throat.  Lung exam is normal.  Very unlikely that she would have had a TIA or anaphylaxis.  She is on an ARB, but does not seem to have true symptoms of angioedema given lack of swelling.  It is most likely that this was secondary to a stress reaction or hyperventilation.  She was advised to follow-up immediately if this occurs again, especially if it does not resolve in a few moments on its own.

## 2019-12-13 NOTE — Patient Instructions (Signed)
Thank you for coming to see me today. It was a pleasure. Today we talked about:   We have referred you for home physical therapy for your shoulder/back pain.  This is coming from the musculature around your scapula.  Physical therapy will be very helpful in the long-term to prevent this from recurring.  In the meantime you can continue to use Tylenol, Voltaren gel, heat.  If your lip tingling happens again, please come back to see [name], especially if it does not improve.  It does not seem to be caused by anything bad, it was most likely secondary to being nervous.  Please follow-up with PCP in 4 weeks.  If you have any questions or concerns, please do not hesitate to call the office at [phone].  Best,   [place] Constable, DO

## 2019-12-13 NOTE — Assessment & Plan Note (Signed)
Appears to be having pain in the periscapular musculature, specifically the rhomboids on the left side.  Discussed with patient that physical therapy would be most helpful for her, but she states she is unable to leave her home safely without assistance and would have to have a ride, which is very difficult for her to obtain.  Home health PT would be the best option for her.  Referral placed.  Given that she has a history of gastric ulcers, would not prescribe NSAIDs at this time.  She can continue to use Tylenol, Voltaren gel, and can also add heat.  Follow-up in 4 weeks with her PCP.

## 2019-12-14 DIAGNOSIS — E1136 Type 2 diabetes mellitus with diabetic cataract: Secondary | ICD-10-CM | POA: Diagnosis not present

## 2019-12-14 DIAGNOSIS — H34811 Central retinal vein occlusion, right eye, with macular edema: Secondary | ICD-10-CM | POA: Diagnosis not present

## 2019-12-14 DIAGNOSIS — H2513 Age-related nuclear cataract, bilateral: Secondary | ICD-10-CM | POA: Diagnosis not present

## 2019-12-14 DIAGNOSIS — H3581 Retinal edema: Secondary | ICD-10-CM | POA: Diagnosis not present

## 2020-01-03 ENCOUNTER — Ambulatory Visit: Payer: Medicare PPO | Attending: Family Medicine | Admitting: Physical Therapy

## 2020-01-11 ENCOUNTER — Other Ambulatory Visit: Payer: Self-pay

## 2020-01-11 ENCOUNTER — Ambulatory Visit (INDEPENDENT_AMBULATORY_CARE_PROVIDER_SITE_OTHER): Payer: Medicare PPO | Admitting: Family Medicine

## 2020-01-11 VITALS — BP 140/72 | HR 79 | Wt 189.8 lb

## 2020-01-11 DIAGNOSIS — Z23 Encounter for immunization: Secondary | ICD-10-CM

## 2020-01-11 DIAGNOSIS — S46001D Unspecified injury of muscle(s) and tendon(s) of the rotator cuff of right shoulder, subsequent encounter: Secondary | ICD-10-CM

## 2020-01-11 DIAGNOSIS — F320 Major depressive disorder, single episode, mild: Secondary | ICD-10-CM

## 2020-01-11 DIAGNOSIS — K5900 Constipation, unspecified: Secondary | ICD-10-CM | POA: Diagnosis not present

## 2020-01-11 MED ORDER — FLUTICASONE PROPIONATE 50 MCG/ACT NA SUSP
2.0000 | Freq: Every day | NASAL | 0 refills | Status: DC
Start: 2020-01-11 — End: 2020-08-10

## 2020-01-11 MED ORDER — POLYETHYLENE GLYCOL 3350 17 GM/SCOOP PO POWD: 17.0000 g | Freq: Two times a day (BID) | ORAL | 1 refills | Status: AC | PRN

## 2020-01-11 NOTE — Progress Notes (Signed)
   Covid-19 Vaccination Clinic  Name:  Edison Wollschlager    MRN: [id number] DOB: 1940/11/04  01/11/2020  Ms. Sinquefield was observed post Covid-19 immunization for 15 minutes without incident. She was provided with Vaccine Information Sheet and instruction to access the V-Safe system.   Ms. Sowder was instructed to call 911 with any severe reactions post vaccine: Marland Kitchen Difficulty breathing  . Swelling of face and throat  . A fast heartbeat  . A bad rash all over body  . Dizziness and weakness

## 2020-01-11 NOTE — Assessment & Plan Note (Signed)
Will trial Miralax. She can continue taking homemade prune juice if she would like.  - Follow-up at next visit.

## 2020-01-11 NOTE — Progress Notes (Addendum)
    SUBJECTIVE:   CHIEF COMPLAINT / HPI:  Shoulder Follow-Up History of infraspinatus injury in October with persistent pain. Referred for home health PT at beginning of November but never set this up as pain resolved on its own. Having no pain today with full range of motion. Able to return to her usual activities.  Constipation Reports several months of infrequent, hard stools. Had been making her own prune juice which helped at first, but symptoms have persisted.    Depression Reports "I don't feel that depressed at all" on Celexa 20mg  daily. Tolerating medication well.   Medication Managament Requesting refill of Flonase.    OBJECTIVE:   BP 140/72   Pulse 79   Wt 189 lb 12.8 oz (86.1 kg)   SpO2 97%   BMI 34.71 kg/m   Physical Exam Vitals reviewed.  Cardiovascular:     Rate and Rhythm: Normal rate and regular rhythm.     Heart sounds: Normal heart sounds.  Pulmonary:     Effort: Pulmonary effort is normal.     Breath sounds: Normal breath sounds.  Musculoskeletal:     Comments: R shoulder with full strength and range of motion in all planes. No tenderness to palpation or pain with active/passive movement.      PHQ9 SCORE ONLY 01/11/2020 12/02/2019 12/02/2019  PHQ-9 Total Score 0 3 1    ASSESSMENT/PLAN:   Rotator cuff injury Seems to be resolved. No further intervention warranted at this time.   Depression, major, single episode, mild (HCC) Tolerating Celexa 20mg  daily without issue.  PHQ-9 today was 0 from 3 at previous visit -Continue Celexa 20 mg daily -Repeat PHQ 9 at each visit  Constipation Will trial Miralax. She can continue taking homemade prune juice if she would like.  - Follow-up at next visit.   Medication Management Refill Flonase   Pearla Dubonnet, Medical Student  [place]   Resident Attestation  I saw and evaluated the patient, performing the key elements of the service.I  personally performed or re-performed  the history, physical exam, and medical decision making activities of this service and have verified that the service and findings are accurately documented in the student's note. I developed the management plan that is described in the medical student's note, and I agree with the content, with my edits above.    Gifford Shave, PGY2

## 2020-01-11 NOTE — Patient Instructions (Signed)
It was a pleasure seeing you today!  I am glad your shoulder is doing better.  I have sent a prescription in for some MiraLAX to help with your constipation.  I also sent in a prescription for your Flonase to be refilled.  We are giving you the Covid booster vaccine today so you may have headache, body aches, fever due to the vaccine.  You can take Tylenol to help with the symptoms.  If you have any issues, questions, concerns please feel free to call the clinic.  I hope you have a wonderful afternoon!

## 2020-01-11 NOTE — Assessment & Plan Note (Signed)
Seems to be resolved. No further intervention warranted at this time.

## 2020-01-11 NOTE — Assessment & Plan Note (Addendum)
Tolerating Celexa 20mg  daily without issue.  PHQ-9 today was 0 from 3 at previous visit -Continue Celexa 20 mg daily -Repeat PHQ 9 at each visit

## 2020-01-22 ENCOUNTER — Other Ambulatory Visit: Payer: Self-pay | Admitting: Family Medicine

## 2020-01-26 DIAGNOSIS — H40053 Ocular hypertension, bilateral: Secondary | ICD-10-CM | POA: Diagnosis not present

## 2020-02-23 ENCOUNTER — Other Ambulatory Visit: Payer: Self-pay | Admitting: Family Medicine

## 2020-02-25 ENCOUNTER — Other Ambulatory Visit: Payer: Self-pay

## 2020-02-25 ENCOUNTER — Ambulatory Visit: Payer: Medicare PPO | Admitting: Family Medicine

## 2020-02-25 ENCOUNTER — Encounter: Payer: Self-pay | Admitting: Family Medicine

## 2020-02-25 DIAGNOSIS — H9203 Otalgia, bilateral: Secondary | ICD-10-CM | POA: Diagnosis not present

## 2020-02-25 NOTE — Progress Notes (Signed)
    SUBJECTIVE:   CHIEF COMPLAINT / HPI:   Ear pain Patient reports that she started having ear pain on Monday 1/17.  The pain started in her left ear and was a throbbing pain and then moved to the right.  Patient reports that she also feels like she has something crawling around in her ear.  She has been taking Tylenol 1-2 times a day and got a Q-tip with some peroxide and put it in her ear without any relief.  Denies any changes in hearing.  Denies any fevers.  Denies any other sick symptoms.  Reports that she is still using Flonase which is helping with her sinuses.   OBJECTIVE:   BP 136/80   Pulse 84   Ht 5\' 4"  (1.626 m)   Wt 189 lb 3.2 oz (85.8 kg)   SpO2 96%   BMI 32.48 kg/m   General: Well-appearing 80 year old female, no acute distress HEENT: Poor dentition with multiple cavities, no tenderness to palpation over sinuses, tenderness to palpation over right TMJ.  No rhinorrhea noted, mild erythema of nasal turbinates.  External auditory canals bilaterally normal, tympanic membranes normal bilaterally with no irritation, erythema, bulging noted. Respiratory: Normal work of breathing MSK: Ambulates without difficulty, no gross abnormalities  ASSESSMENT/PLAN:   Ear pain, bilateral 5-day history of ear pain which patient reports is throbbing.  Physical exam was reassuring and points towards TMJ being the most likely diagnosis.  Patient also has poor dentition but no active dental infection identified.  Recommended patient take Tylenol up to 3 times a day, use warm compresses, and see a dentist.  Strict return precautions given and patient is agreeable to this.     Gifford Shave, MD [place]

## 2020-02-25 NOTE — Patient Instructions (Addendum)
It was great to see you today.  The source of your ear pain is most likely from your mouth.  It may be due to dental issues but could also be something called TMJ.  I recommend that you schedule an appointment with your dentist because you do have a number of cavities that should be addressed.  You can take Tylenol up to 3 times a day, do not take more than 4 g in a day.  If your symptoms worsen or you start having fevers please be reevaluated.  I hope you have a wonderful afternoon!   Temporomandibular Joint Syndrome  Temporomandibular joint syndrome (TMJ syndrome) is a condition that causes pain in the temporomandibular joints. These joints are located near your ears and allow your jaw to open and close. For people with TMJ syndrome, chewing, biting, or other movements of the jaw can be difficult or painful. TMJ syndrome is often mild and goes away within a few weeks. However, sometimes the condition becomes a long-term (chronic) problem. What are the causes? This condition may be caused by:  Grinding your teeth or clenching your jaw. Some people do this when they are under stress.  Arthritis.  Injury to the jaw.  Head or neck injury.  Teeth or dentures that are not aligned well. In some cases, the cause of TMJ syndrome may not be known. What are the signs or symptoms? The most common symptom of this condition is an aching pain on the side of the head in the area of the TMJ. Other symptoms may include:  Pain when moving your jaw, such as when chewing or biting.  Being unable to open your jaw all the way.  Making a clicking sound when you open your mouth.  Headache.  Earache.  Neck or shoulder pain. How is this diagnosed? This condition may be diagnosed based on:  Your symptoms and medical history.  A physical exam. Your health care provider may check the range of motion of your jaw.  Imaging tests, such as X-rays or an MRI. You may also need to see your dentist, who will  determine if your teeth and jaw are lined up correctly. How is this treated? TMJ syndrome often goes away on its own. If treatment is needed, the options may include:  Eating soft foods and applying ice or heat.  Medicines to relieve pain or inflammation.  Medicines or massage to relax the muscles.  A splint, bite plate, or mouthpiece to prevent teeth grinding or jaw clenching.  Relaxation techniques or counseling to help reduce stress.  A therapy for pain in which an electrical current is applied to the nerves through the skin (transcutaneous electrical nerve stimulation).  Acupuncture. This is sometimes helpful to relieve pain.  Jaw surgery. This is rarely needed. Follow these instructions at home: Eating and drinking  Eat a soft diet if you are having trouble chewing.  Avoid foods that require a lot of chewing. Do not chew gum. General instructions  Take over-the-counter and prescription medicines only as told by your health care provider.  If directed, put ice on the painful area. ? Put ice in a plastic bag. ? Place a towel between your skin and the bag. ? Leave the ice on for 20 minutes, 2-3 times a day.  Apply a warm, wet cloth (warm compress) to the painful area as directed.  Massage your jaw area and do any jaw stretching exercises as told by your health care provider.  If you were  given a splint, bite plate, or mouthpiece, wear it as told by your health care provider.  Keep all follow-up visits as told by your health care provider. This is important.   Contact a health care provider if:  You are having trouble eating.  You have new or worsening symptoms. Get help right away if:  Your jaw locks open or closed. Summary  Temporomandibular joint syndrome (TMJ syndrome) is a condition that causes pain in the temporomandibular joints. These joints are located near your ears and allow your jaw to open and close.  TMJ syndrome is often mild and goes away within a  few weeks. However, sometimes the condition becomes a long-term (chronic) problem.  Symptoms include an aching pain on the side of the head in the area of the TMJ, pain when chewing or biting, and being unable to open your jaw all the way. You may also make a clicking sound when you open your mouth.  TMJ syndrome often goes away on its own. If treatment is needed, it may include medicines to relieve pain, reduce inflammation, or relax the muscles. A splint, bite plate, or mouthpiece may also be used to prevent teeth grinding or jaw clenching. This information is not intended to replace advice given to you by your health care provider. Make sure you discuss any questions you have with your health care provider. Document Revised: 04/04/2017 Document Reviewed: 03/04/2017 Elsevier Patient Education  2021 [employer].

## 2020-02-25 NOTE — Assessment & Plan Note (Signed)
5-day history of ear pain which patient reports is throbbing.  Physical exam was reassuring and points towards TMJ being the most likely diagnosis.  Patient also has poor dentition but no active dental infection identified.  Recommended patient take Tylenol up to 3 times a day, use warm compresses, and see a dentist.  Strict return precautions given and patient is agreeable to this.

## 2020-03-07 ENCOUNTER — Other Ambulatory Visit: Payer: Self-pay | Admitting: *Deleted

## 2020-03-07 DIAGNOSIS — I10 Essential (primary) hypertension: Secondary | ICD-10-CM

## 2020-03-07 MED ORDER — LOSARTAN POTASSIUM-HCTZ 100-12.5 MG PO TABS: 1.0000 | ORAL_TABLET | Freq: Every day | ORAL | 3 refills | Status: DC

## 2020-03-27 NOTE — Patient Instructions (Signed)
Good to see you today - Thank you for coming in  Things we discussed today:  Heart burn - take omeprazole 20 mg 1 hour before bed.  Should help in 2-3 days if not then take it twice a day  If not helping or getting worse any pain with exertion let us know immediately  Neck pain - continue tylenol as needed  Your goal blood pressure is less than 140/90.  Check your blood pressure several times a week.  If regularly higher than this please let me know - either with MyChart or leaving a phone message.  Consider getting blood pressure meter and  Next visit please bring in your blood pressure cuff.    Please always bring your medication bottles  See Dr Caron Presume in April or if not feeling better

## 2020-03-27 NOTE — Progress Notes (Signed)
    SUBJECTIVE:   CHIEF COMPLAINT / HPI:   HEARTBURN Complains of a burning mid chest discomfort seems to come up from stomach area especially when is lying down.  No pain with exertion or shortness of breath.  Tums seems to help for a little while.  No vomiting or bleeding or food sticking or weight loss Used to take nexium but costs too much  EAR PAIN Improved  NECK PAIN Sometimes has are below both ears that aches.  No swelling or sore throat or current ear pain.  No worse with neck range of motion. Pain is mild and does not interfere with her life   PERTINENT  PMH / PSH: diabetes   OBJECTIVE:   BP 138/82   Pulse 77   Wt 189 lb 6.4 oz (85.9 kg)   SpO2 96%   BMI 32.51 kg/m   Neck:  No deformities, thyromegaly, masses, or tenderness noted.   Supple with full range of motion without pain. Ears:  External ear exam shows no significant lesions or deformities.  Otoscopic examination reveals clear canals, tympanic membranes are intact bilaterally without bulging, retraction, inflammation or discharge. Hearing is grossly normal bilaterall Heart - Regular rate and rhythm.  No murmurs, gallops or rubs.    Lungs:  Normal respiratory effort, chest expands symmetrically. Lungs are clear to auscultation, no crackles or wheezes. Abdomen: soft and non-tender without masses, organomegaly or hernias noted.  No guarding or rebound Extremities:  No cyanosis, edema, or deformity noted with good range of motion of all major joints.     ASSESSMENT/PLAN:   GERD Recurrent heartburn consistent with GERD.  No signs of ulcer or esophageal mass.  Treat with omeprazole and monitor symptoms    Neck Pain - mild possible musculoskeletal vs residual from ear infection or TMJ.  Tylenol as needed.  Return if not improving   Lind Covert, MD [place]

## 2020-03-28 ENCOUNTER — Other Ambulatory Visit: Payer: Self-pay

## 2020-03-28 ENCOUNTER — Encounter: Payer: Self-pay | Admitting: Family Medicine

## 2020-03-28 ENCOUNTER — Ambulatory Visit (INDEPENDENT_AMBULATORY_CARE_PROVIDER_SITE_OTHER): Payer: Medicare PPO | Admitting: Family Medicine

## 2020-03-28 VITALS — BP 138/82 | HR 77 | Wt 189.4 lb

## 2020-03-28 DIAGNOSIS — E113299 Type 2 diabetes mellitus with mild nonproliferative diabetic retinopathy without macular edema, unspecified eye: Secondary | ICD-10-CM

## 2020-03-28 DIAGNOSIS — K219 Gastro-esophageal reflux disease without esophagitis: Secondary | ICD-10-CM | POA: Diagnosis not present

## 2020-03-28 LAB — POCT GLYCOSYLATED HEMOGLOBIN (HGB A1C): Hemoglobin A1C: 5.7 % — AB (ref 4.0–5.6)

## 2020-03-28 MED ORDER — OMEPRAZOLE 20 MG PO CPDR: 20.0000 mg | DELAYED_RELEASE_CAPSULE | Freq: Every day | ORAL | 3 refills | Status: DC

## 2020-03-29 NOTE — Assessment & Plan Note (Signed)
Recurrent heartburn consistent with GERD.  No signs of ulcer or esophageal mass.  Treat with omeprazole and monitor symptoms

## 2020-04-21 DIAGNOSIS — H3581 Retinal edema: Secondary | ICD-10-CM | POA: Diagnosis not present

## 2020-04-21 DIAGNOSIS — H34811 Central retinal vein occlusion, right eye, with macular edema: Secondary | ICD-10-CM | POA: Diagnosis not present

## 2020-04-21 DIAGNOSIS — E1136 Type 2 diabetes mellitus with diabetic cataract: Secondary | ICD-10-CM | POA: Diagnosis not present

## 2020-04-21 DIAGNOSIS — Z7984 Long term (current) use of oral hypoglycemic drugs: Secondary | ICD-10-CM | POA: Diagnosis not present

## 2020-04-21 DIAGNOSIS — H2513 Age-related nuclear cataract, bilateral: Secondary | ICD-10-CM | POA: Diagnosis not present

## 2020-05-22 ENCOUNTER — Telehealth: Payer: Self-pay

## 2020-05-22 NOTE — Telephone Encounter (Signed)
Patient calls nurse line regarding "bump" on upper leg. Patient reports that area is swollen and painful. Denies warmth, redness or fever. Patient has been taking tylenol PRN for pain. Scheduled patient with Dr. Jeannine Kitten tomorrow (4/19) afternoon. Strict UC/ED precautions given.   FYI to PCP and Dr. Jeannine Kitten.   Talbot Grumbling, RN

## 2020-05-23 ENCOUNTER — Ambulatory Visit (INDEPENDENT_AMBULATORY_CARE_PROVIDER_SITE_OTHER): Payer: BC Managed Care – PPO | Admitting: Family Medicine

## 2020-05-23 ENCOUNTER — Encounter: Payer: Self-pay | Admitting: Family Medicine

## 2020-05-23 ENCOUNTER — Other Ambulatory Visit: Payer: Self-pay

## 2020-05-23 VITALS — BP 134/78 | HR 102 | Wt 193.0 lb

## 2020-05-23 DIAGNOSIS — N9089 Other specified noninflammatory disorders of vulva and perineum: Secondary | ICD-10-CM

## 2020-05-23 MED ORDER — DOXYCYCLINE HYCLATE 100 MG PO TABS: 100.0000 mg | ORAL_TABLET | Freq: Two times a day (BID) | ORAL | 0 refills | Status: DC

## 2020-05-23 MED ORDER — TRAMADOL HCL 50 MG PO TABS: 50.0000 mg | ORAL_TABLET | Freq: Two times a day (BID) | ORAL | 0 refills | Status: DC | PRN

## 2020-05-23 NOTE — Progress Notes (Signed)
    SUBJECTIVE:   CHIEF COMPLAINT / HPI:   Patient states she has had a 'knot' on the right side of her groin for the past week.  Does not recall doing anything to injure it.  It is painful to the touch.  It has not been draining or bleeding.  No fevers/chills. No n/v.  No vaginal discharge or dysuria.      PERTINENT  PMH / PSH:   OBJECTIVE:   BP 134/78   Pulse (!) 102   Wt 193 lb (87.5 kg)   SpO2 95%   BMI 33.13 kg/m   Gen: alert, oriented.  No acute distress.  Elderly woman, appears stated age.  CV: RRR Pulm: lctab.  GU: on the superior right labia adjacent to the clitoris the patient has a large area of induration approximately 3cm x 5cm.  It is tender to palpation.  There is an overlying area of mild ulceration approximately 1cm x 1.5cm.  There is no discharge.  No fluctuance appreciated.  Not warm to touch.  Not particularly erythematous except for the overlying ulceration.   ASSESSMENT/PLAN:   Labial lesion suden onset, ongoing for 1 week.  Tender area of ulceration on the right labia. Concern for cellulitis vs sarcoma. No fluctuance indicating an abscess. Pt does not report drainage and none noted on exam.  No fevers, chills or signs of systemic infection.  Will treat with doxycycline x 10d, order cbc, cmp. Tylenol, naproxen for pain.  Tramadol for severe pain.  Will put in urgent referral to gyn for further evaluation.       Benay Pike, MD [place]

## 2020-05-23 NOTE — Patient Instructions (Addendum)
It was nice to meet you today,  For the bump on your groin we do have the following: - For pain control use Tylenol and Aleve over-the-counter as needed.  Do not take more than directed.  Do not take more than 2 mg every day. - for pain not controlled by Tylenol and Aleve you can use the tramadol as prescribed.  Do not use more than twice a day - We have prescribed you antibiotics that you will take twice a day for the next 10 days.  -You should also wash the area with soap and water - You can take a damp hot washcloth and put it on the area to help facilitate drainage.  You can do this multiple times a day. - We are going to put in a referral for OB/GYN in case this is not a infection.  Someone should call you soon from their office. - We are going to get some blood test today.  I will follow-up with the results of that when I get them.  Please follow-up with your primary care provider in 2 to 3 weeks.  Have a great day,  Clemetine Marker, MD

## 2020-05-24 DIAGNOSIS — N9089 Other specified noninflammatory disorders of vulva and perineum: Secondary | ICD-10-CM | POA: Insufficient documentation

## 2020-05-24 LAB — COMPREHENSIVE METABOLIC PANEL
ALT: 10 IU/L (ref 0–32)
AST: 14 IU/L (ref 0–40)
Albumin/Globulin Ratio: 1.2 (ref 1.2–2.2)
Albumin: 4.2 g/dL (ref 3.7–4.7)
Alkaline Phosphatase: 74 IU/L (ref 44–121)
BUN/Creatinine Ratio: 19 (ref 12–28)
BUN: 20 mg/dL (ref 8–27)
Bilirubin Total: <0.2 mg/dL (ref 0.0–1.2)
CO2: 23 mmol/L (ref 20–29)
Calcium: 9.8 mg/dL (ref 8.7–10.3)
Chloride: 103 mmol/L (ref 96–106)
Creatinine, Ser: 1.07 mg/dL — ABNORMAL HIGH (ref 0.57–1.00)
Globulin, Total: 3.4 g/dL (ref 1.5–4.5)
Glucose: 97 mg/dL (ref 65–99)
Potassium: 3.9 mmol/L (ref 3.5–5.2)
Sodium: 146 mmol/L — ABNORMAL HIGH (ref 134–144)
Total Protein: 7.6 g/dL (ref 6.0–8.5)
eGFR: 53 mL/min/{1.73_m2} — ABNORMAL LOW (ref >59)

## 2020-05-24 LAB — CBC WITH DIFFERENTIAL
Basophils Absolute: 0 10*3/uL (ref 0.0–0.2)
Basos: 0 %
EOS (ABSOLUTE): 0.2 10*3/uL (ref 0.0–0.4)
Eos: 2 %
Hematocrit: 32.7 % — ABNORMAL LOW (ref 34.0–46.6)
Hemoglobin: 10.3 g/dL — ABNORMAL LOW (ref 11.1–15.9)
Immature Grans (Abs): 0 10*3/uL (ref 0.0–0.1)
Immature Granulocytes: 0 %
Lymphocytes Absolute: 4.1 10*3/uL — ABNORMAL HIGH (ref 0.7–3.1)
Lymphs: 38 %
MCH: 24.5 pg — ABNORMAL LOW (ref 26.6–33.0)
MCHC: 31.5 g/dL (ref 31.5–35.7)
MCV: 78 fL — ABNORMAL LOW (ref 79–97)
Monocytes Absolute: 1 10*3/uL — ABNORMAL HIGH (ref 0.1–0.9)
Monocytes: 9 %
Neutrophils Absolute: 5.5 10*3/uL (ref 1.4–7.0)
Neutrophils: 51 %
RBC: 4.21 x10E6/uL (ref 3.77–5.28)
RDW: 14 % (ref 11.7–15.4)
WBC: 10.8 10*3/uL (ref 3.4–10.8)

## 2020-05-24 NOTE — Assessment & Plan Note (Signed)
suden onset, ongoing for 1 week.  Tender area of ulceration on the right labia. Concern for cellulitis vs sarcoma. No fluctuance indicating an abscess. Pt does not report drainage and none noted on exam.  No fevers, chills or signs of systemic infection.  Will treat with doxycycline x 10d, order cbc, cmp. Tylenol, naproxen for pain.  Tramadol for severe pain.  Will put in urgent referral to gyn for further evaluation.

## 2020-05-25 ENCOUNTER — Ambulatory Visit: Payer: Medicare PPO

## 2020-06-05 ENCOUNTER — Ambulatory Visit: Payer: Self-pay | Admitting: Obstetrics and Gynecology

## 2020-06-08 ENCOUNTER — Telehealth: Payer: Self-pay | Admitting: Family Medicine

## 2020-06-08 NOTE — Telephone Encounter (Signed)
Called patient regarding missed appointments and her vulvar lesion.  She reports that the lesion busted and drained and is now completely healed.  She reports that she has an appointment scheduled with gynecology in 1 month and that she has a ride already scheduled for that.  She has no concerns or questions at this time.

## 2020-06-08 NOTE — Telephone Encounter (Signed)
-----   Message from Martyn Malay, MD sent at 06/08/2020  8:56 AM EDT ----- Regarding: RE: Vulvar Lesion Sounds good thanks--be sure to document a phone note you called! This helps with many items (fewer staff messages from me).   ----- Message ----- From: Gifford Shave, MD Sent: 06/08/2020   8:55 AM EDT To: Martyn Malay, MD Subject: RE: Vulvar Lesion                              I attempted to call the patient twice and was unsuccessful. I will try again today.  Christy Sartorius ----- Message ----- From: Martyn Malay, MD Sent: 06/08/2020   8:55 AM EDT To: Benay Pike, MD, Gifford Shave, MD Subject: FW: Vulvar Lesion                              Let me know you if you are having issues contacting this patient.   Thanks, CB  ----- Message ----- From: Martyn Malay, MD Sent: 06/06/2020   7:56 AM EDT To: Benay Pike, MD, Gifford Shave, MD Subject: Vulvar Lesion                                  Hi Dan,  I am still worried about [facility] or other cancer for this patient---it looks like she missed 2 Gyn appointments. Can you call her and see what the barrier is? If transportation etc we should work with [employer].  Thanks! CB

## 2020-06-14 ENCOUNTER — Other Ambulatory Visit: Payer: Self-pay | Admitting: Family Medicine

## 2020-07-10 ENCOUNTER — Ambulatory Visit: Payer: Self-pay | Admitting: Obstetrics and Gynecology

## 2020-07-17 ENCOUNTER — Other Ambulatory Visit: Payer: Self-pay | Admitting: Family Medicine

## 2020-07-26 ENCOUNTER — Ambulatory Visit (INDEPENDENT_AMBULATORY_CARE_PROVIDER_SITE_OTHER): Payer: BC Managed Care – PPO | Admitting: Family Medicine

## 2020-07-26 ENCOUNTER — Other Ambulatory Visit: Payer: Self-pay

## 2020-07-26 ENCOUNTER — Encounter: Payer: Self-pay | Admitting: Family Medicine

## 2020-07-26 VITALS — BP 125/61 | HR 78 | Wt 188.2 lb

## 2020-07-26 DIAGNOSIS — N9089 Other specified noninflammatory disorders of vulva and perineum: Secondary | ICD-10-CM | POA: Diagnosis not present

## 2020-07-26 DIAGNOSIS — R110 Nausea: Secondary | ICD-10-CM | POA: Diagnosis not present

## 2020-07-26 MED ORDER — ONDANSETRON HCL 4 MG PO TABS: 4.0000 mg | ORAL_TABLET | Freq: Three times a day (TID) | ORAL | 0 refills | Status: DC | PRN

## 2020-07-26 NOTE — Assessment & Plan Note (Signed)
Occurred after eating spinach.  Had 1 episode lasting 30 minutes of vomiting.  Persistent nausea since that time, but not interfering with p.o. intake.  Most likely food poisoning given the timing of the episode.  Will prescribe Zofran for nausea as needed.  Return precautions discussed.

## 2020-07-26 NOTE — Progress Notes (Signed)
    SUBJECTIVE:   CHIEF COMPLAINT / HPI:   Nausea: Patient states that on Tuesday she cooked some spinach that she got from the grocery store.  It was back for spinach.  Afterwards she started vomiting for approximately 30 minutes.  Nonbloody, nonbilious.  No diarrhea.  No more episodes of vomiting after that but continues to be nauseous.  Is drinking water and cranberry juice without difficulty.  Is eating less due to nausea but still able to keep food down.  Nobody else ate the same thing she did that day.  Has not been around anybody else that has been sick  Labial lesion: Patient states that approximately 1 week after our last visit in which she complained about a labial lesion she states it resolved.  States a pinkish pea-sized piece of tissue expelled and after that the lesion resolved.  PERTINENT  PMH / PSH: DM  OBJECTIVE:   BP 125/61   Pulse 78   Wt 188 lb 4 oz (85.4 kg)   SpO2 96%   BMI 32.31 kg/m   General: Alert and oriented.  No acute distress CV: Regular rate and rhythm, Pulmonary: Clear to auscultation bilaterally GI: Soft, tender to palpation on the right lower quadrant.  Normal bowel sounds. GU: Normal-appearing labia.  No evidence of previous lesion.  ASSESSMENT/PLAN:   Nausea Occurred after eating spinach.  Had 1 episode lasting 30 minutes of vomiting.  Persistent nausea since that time, but not interfering with p.o. intake.  Most likely food poisoning given the timing of the episode.  Will prescribe Zofran for nausea as needed.  Return precautions discussed.  Labial lesion Patient states that lesion resolved approximately 10 days after our last appointment.  Therefore she did not go to the gynecologist.  States that a small spherical pinkish ball the size of a pea was expelled from the lesion and healed after that.  Physical exam does not show previous lesion seen at last visit.  Likely due to folliculitis.     Benay Pike, MD [place]

## 2020-07-26 NOTE — Assessment & Plan Note (Signed)
Patient states that lesion resolved approximately 10 days after our last appointment.  Therefore she did not go to the gynecologist.  States that a small spherical pinkish ball the size of a pea was expelled from the lesion and healed after that.  Physical exam does not show previous lesion seen at last visit.  Likely due to folliculitis.

## 2020-07-26 NOTE — Patient Instructions (Signed)
It was nice to see you today,  I believe you had food poisoning.  I have prescribed you a medication for nausea.  Take 1 tablet every 4 hours as needed for nausea.  If you start to develop uncontrollable vomiting or diarrhea and unable to keep liquids down please let us know or go to the emergency department to make sure you do not become dehydrated.  Have a great day,  Clemetine Marker, MD

## 2020-08-10 ENCOUNTER — Other Ambulatory Visit: Payer: Self-pay | Admitting: Family Medicine

## 2020-09-03 ENCOUNTER — Other Ambulatory Visit: Payer: Self-pay | Admitting: Family Medicine

## 2020-09-03 DIAGNOSIS — E113299 Type 2 diabetes mellitus with mild nonproliferative diabetic retinopathy without macular edema, unspecified eye: Secondary | ICD-10-CM

## 2020-09-11 ENCOUNTER — Other Ambulatory Visit: Payer: Self-pay | Admitting: Family Medicine

## 2020-09-11 DIAGNOSIS — R079 Chest pain, unspecified: Secondary | ICD-10-CM

## 2020-10-04 ENCOUNTER — Encounter: Payer: Self-pay | Admitting: Family Medicine

## 2020-10-04 ENCOUNTER — Ambulatory Visit (INDEPENDENT_AMBULATORY_CARE_PROVIDER_SITE_OTHER): Payer: BC Managed Care – PPO | Admitting: Family Medicine

## 2020-10-04 ENCOUNTER — Other Ambulatory Visit: Payer: Self-pay

## 2020-10-04 VITALS — BP 126/78 | HR 82 | Ht 64.0 in | Wt 187.4 lb

## 2020-10-04 DIAGNOSIS — E1169 Type 2 diabetes mellitus with other specified complication: Secondary | ICD-10-CM

## 2020-10-04 DIAGNOSIS — E785 Hyperlipidemia, unspecified: Secondary | ICD-10-CM | POA: Diagnosis not present

## 2020-10-04 DIAGNOSIS — E113299 Type 2 diabetes mellitus with mild nonproliferative diabetic retinopathy without macular edema, unspecified eye: Secondary | ICD-10-CM

## 2020-10-04 LAB — POCT GLYCOSYLATED HEMOGLOBIN (HGB A1C): HbA1c, POC (controlled diabetic range): 6.1 % (ref 0.0–7.0)

## 2020-10-04 MED ORDER — PRAVASTATIN SODIUM 40 MG PO TABS: 40.0000 mg | ORAL_TABLET | Freq: Every day | ORAL | 3 refills | Status: DC

## 2020-10-04 NOTE — Progress Notes (Signed)
    SUBJECTIVE:   Chief compliant/HPI: annual examination  Amber [name] is a 80 y.o. who presents today for an annual exam.    History tabs reviewed and updated .   Review of systems form reviewed and notable for no current concerns or complaints..   OBJECTIVE:   BP 126/78   Pulse 82   Ht '5\' 4"'$  (1.626 m)   Wt 187 lb 6.4 oz (85 kg)   SpO2 97%   BMI 32.17 kg/m   General: Well-appearing 80 year old female, no acute distress Cardiac: Regular rate and rhythm, no murmurs appreciated Respiratory: Normal for breathing, lungs clear to auscultation bilaterally Abdomen: Soft, nontender, positive bowel sounds MSK: No gross abnormalities Neuro: Cranial nerves grossly intact   ASSESSMENT/PLAN:   No problem-specific Assessment & Plan notes found for this encounter.    Annual Examination  See AVS for age appropriate recommendations  PHQ score 3, reviewed and discussed.  PHQ9 SCORE ONLY 10/04/2020 07/26/2020 03/28/2020  PHQ-9 Total Score 3 0 0    BP reviewed and at goal .  Asked about intimate partner violence and resources given as appropriate  Advance directives discussion :daughter is medical decision maker   Considered the following items based upon USPSTF recommendations: Diabetes screening: ordered A1c was 6.1.   Vaccinations discussed Shingrix.  Also discussed COVID-19 booster and patient will wait for new vaccine..   Follow up in 1  year or sooner if indicated.    Gifford Shave, MD [place]

## 2020-10-04 NOTE — Progress Notes (Signed)
a1c

## 2020-10-04 NOTE — Patient Instructions (Signed)
It was great seeing you today!  Your hemoglobin A1c was 6.1 so we will not change her diabetes medications.  Regarding your cholesterol I am going to check it today and refilled your cholesterol medication.  If you have any questions or concerns call the clinic.  I hope you have a wonderful afternoon!

## 2020-10-05 LAB — LIPID PANEL
Chol/HDL Ratio: 3.6 ratio (ref 0.0–4.4)
Cholesterol, Total: 167 mg/dL (ref 100–199)
HDL: 46 mg/dL (ref >39)
LDL Chol Calc (NIH): 100 mg/dL — ABNORMAL HIGH (ref 0–99)
Triglycerides: 118 mg/dL (ref 0–149)
VLDL Cholesterol Cal: 21 mg/dL (ref 5–40)

## 2020-10-05 NOTE — Assessment & Plan Note (Signed)
Current medications include metformin.  Hemoglobin A1c 6.1 today from 5.9 at previous check.  Continue metformin as prescribed and follow-up in 3 months.

## 2020-10-05 NOTE — Assessment & Plan Note (Signed)
We will continue to monitor intensity statin.  Lipid panel collected today.  Make adjustments as needed.

## 2020-10-08 ENCOUNTER — Other Ambulatory Visit: Payer: Self-pay | Admitting: Family Medicine

## 2020-10-08 DIAGNOSIS — R079 Chest pain, unspecified: Secondary | ICD-10-CM

## 2020-11-08 ENCOUNTER — Other Ambulatory Visit: Payer: Self-pay | Admitting: Family Medicine

## 2020-11-08 DIAGNOSIS — R079 Chest pain, unspecified: Secondary | ICD-10-CM

## 2020-11-28 ENCOUNTER — Other Ambulatory Visit: Payer: Self-pay | Admitting: Family Medicine

## 2020-11-30 ENCOUNTER — Ambulatory Visit: Payer: BC Managed Care – PPO

## 2020-12-08 ENCOUNTER — Ambulatory Visit (INDEPENDENT_AMBULATORY_CARE_PROVIDER_SITE_OTHER): Payer: BC Managed Care – PPO | Admitting: Family Medicine

## 2020-12-08 ENCOUNTER — Other Ambulatory Visit: Payer: Self-pay

## 2020-12-08 VITALS — BP 147/72 | HR 87 | Ht 64.0 in | Wt 187.0 lb

## 2020-12-08 DIAGNOSIS — M549 Dorsalgia, unspecified: Secondary | ICD-10-CM | POA: Diagnosis not present

## 2020-12-08 LAB — POCT URINALYSIS DIP (MANUAL ENTRY)
Bilirubin, UA: NEGATIVE
Blood, UA: NEGATIVE
Glucose, UA: NEGATIVE mg/dL
Ketones, POC UA: NEGATIVE mg/dL
Nitrite, UA: NEGATIVE
Protein Ur, POC: NEGATIVE mg/dL
Spec Grav, UA: 1.02 (ref 1.010–1.025)
Urobilinogen, UA: 0.2 E.U./dL
pH, UA: 5.5 (ref 5.0–8.0)

## 2020-12-08 LAB — POCT UA - MICROSCOPIC ONLY

## 2020-12-08 MED ORDER — PRAVASTATIN SODIUM 40 MG PO TABS: 40.0000 mg | ORAL_TABLET | Freq: Every day | ORAL | 3 refills | Status: DC

## 2020-12-08 NOTE — Progress Notes (Signed)
    SUBJECTIVE:   CHIEF COMPLAINT / HPI:   Low Back pain  Patient reports she has been having intermittent low back pain for the last month.  She is also noticed increased urination but denies any burning with urination or blood in her urine.  Reports history of UTIs.  She does acknowledge that she recently had a dental procedure and has been on clindamycin for the last week.  Left upper extremity weakness Patient reports that over the last few weeks she has also noticed what she feels is weakness in her left upper extremity.  She does report she has been working more around the house cleaning, sweeping, vacuuming and she uses her left hand to guide everything.  Denies any pain in the left arm or changes in sensation, numbness or tingling.  Denies any injury to the left upper extremity. OBJECTIVE:   BP (!) 147/72   Pulse 87   Ht 5\' 4"  (1.626 m)   Wt 187 lb (84.8 kg)   SpO2 100%   BMI 32.10 kg/m   General: Pleasant 80 year old female, no acute distress Cardiac: Regular rate and rhythm, no murmurs appreciated Respiratory: Normal work of breathing, lungs clear to auscultation bilaterally Abdomen: Soft, positive bowel sounds, right-sided costovertebral angle tenderness MSK: 5/5 upper extremity strength bilaterally, no physical abnormalities, ambulates without difficulty  ASSESSMENT/PLAN:   Back pain Feel that the back pain is most likely MSK.  There is concerned that she might have a UTI so urinalysis collected showing moderate bacteria but nitrite negative.  Patient is currently on clindamycin which would not treat a UTI but we will culture the urine before treating to see if there is need for antibiotics for urinary tract infection.  Discussed Tylenol use for the pain as well as strict ED and return precautions.  Patient is agreeable to this.   Left upper extremity weakness: Feel that this is most likely due to overuse.  Physical exam was extremely reassuring with no weakness  appreciated, no changes in sensation.  Discussed return precautions regarding this left upper extremity weakness.  No further questions or concerns.  Gifford Shave, MD [place]

## 2020-12-08 NOTE — Patient Instructions (Signed)
It was wonderful seeing you today.  Your urine does not look like you have a UTI but I am going to send it for a culture.  If a bacteria grows I will call you to call in an antibiotic.  If your symptoms worsen please go be evaluated either here or at the emergency department.  Regarding your left arm weakness I recommend resting it and you can use Tylenol for any pain.  If you have any questions or concerns please call the clinic.  I hope you have a wonderful afternoon!

## 2020-12-09 NOTE — Assessment & Plan Note (Signed)
Feel that the back pain is most likely MSK.  There is concerned that she might have a UTI so urinalysis collected showing moderate bacteria but nitrite negative.  Patient is currently on clindamycin which would not treat a UTI but we will culture the urine before treating to see if there is need for antibiotics for urinary tract infection.  Discussed Tylenol use for the pain as well as strict ED and return precautions.  Patient is agreeable to this.

## 2020-12-11 ENCOUNTER — Other Ambulatory Visit: Payer: Self-pay | Admitting: Family Medicine

## 2020-12-11 DIAGNOSIS — E113299 Type 2 diabetes mellitus with mild nonproliferative diabetic retinopathy without macular edema, unspecified eye: Secondary | ICD-10-CM

## 2020-12-11 DIAGNOSIS — R079 Chest pain, unspecified: Secondary | ICD-10-CM

## 2020-12-13 LAB — URINE CULTURE

## 2021-01-07 ENCOUNTER — Other Ambulatory Visit: Payer: Self-pay | Admitting: Family Medicine

## 2021-01-07 DIAGNOSIS — R079 Chest pain, unspecified: Secondary | ICD-10-CM

## 2021-01-10 ENCOUNTER — Other Ambulatory Visit: Payer: Self-pay | Admitting: Family Medicine

## 2021-02-11 ENCOUNTER — Other Ambulatory Visit: Payer: Self-pay | Admitting: Family Medicine

## 2021-02-11 DIAGNOSIS — R079 Chest pain, unspecified: Secondary | ICD-10-CM

## 2021-02-26 ENCOUNTER — Other Ambulatory Visit: Payer: Self-pay | Admitting: Family Medicine

## 2021-02-26 DIAGNOSIS — I10 Essential (primary) hypertension: Secondary | ICD-10-CM

## 2021-03-02 ENCOUNTER — Other Ambulatory Visit: Payer: Self-pay | Admitting: Family Medicine

## 2021-03-05 ENCOUNTER — Ambulatory Visit: Payer: BC Managed Care – PPO

## 2021-03-14 ENCOUNTER — Encounter: Payer: Self-pay | Admitting: Family Medicine

## 2021-03-14 ENCOUNTER — Ambulatory Visit (INDEPENDENT_AMBULATORY_CARE_PROVIDER_SITE_OTHER): Payer: HMO | Admitting: Family Medicine

## 2021-03-14 ENCOUNTER — Other Ambulatory Visit: Payer: Self-pay

## 2021-03-14 VITALS — BP 135/79 | HR 72 | Ht 64.0 in | Wt 189.4 lb

## 2021-03-14 DIAGNOSIS — H40053 Ocular hypertension, bilateral: Secondary | ICD-10-CM | POA: Diagnosis not present

## 2021-03-14 DIAGNOSIS — Z23 Encounter for immunization: Secondary | ICD-10-CM | POA: Diagnosis not present

## 2021-03-14 DIAGNOSIS — I10 Essential (primary) hypertension: Secondary | ICD-10-CM

## 2021-03-14 DIAGNOSIS — E113299 Type 2 diabetes mellitus with mild nonproliferative diabetic retinopathy without macular edema, unspecified eye: Secondary | ICD-10-CM

## 2021-03-14 DIAGNOSIS — R079 Chest pain, unspecified: Secondary | ICD-10-CM | POA: Diagnosis not present

## 2021-03-14 LAB — POCT GLYCOSYLATED HEMOGLOBIN (HGB A1C): HbA1c, POC (controlled diabetic range): 6.1 % (ref 0.0–7.0)

## 2021-03-14 MED ORDER — ISOSORBIDE MONONITRATE ER 30 MG PO TB24: 30.0000 mg | ORAL_TABLET | Freq: Every day | ORAL | 3 refills | Status: DC

## 2021-03-14 MED ORDER — METFORMIN HCL 500 MG PO TABS: 500.0000 mg | ORAL_TABLET | Freq: Two times a day (BID) | ORAL | 3 refills | Status: DC

## 2021-03-14 NOTE — Patient Instructions (Signed)
It was good seeing you today.  I am glad you are doing well.  I refilled the medications that you needed.  Congratulations on getting your flu shot this year.  I want to continue on your current diabetes regimen.  If you have any questions or concerns please call the clinic.  I hope you have a great afternoon!

## 2021-03-14 NOTE — Progress Notes (Signed)
° ° °  SUBJECTIVE:   CHIEF COMPLAINT / HPI:   HTN Patient presents for hypertension management.  She feels like her blood pressures have been well controlled on her current regimen but is about to run out of her Imdur.  Would like her refilled the prescription for that.  Blood pressure today well controlled.   Diabetes Patient's diabetes is managed with metformin.  Compliant with the medication but needs refill for this.  Denies any hypoglycemic episodes.  OBJECTIVE:   BP 135/79    Pulse 72    Ht 5\' 4"  (1.626 m)    Wt 189 lb 6 oz (85.9 kg)    SpO2 99%    BMI 32.51 kg/m   General: Pleasant 81 year old female sitting comfortably in chair, no acute distress Cardiac: Regular rate and rhythm Respiratory: Normal work of breathing Abdomen: Soft, nontender MSK: No gross abnormalities, ambulates without difficulty   ASSESSMENT/PLAN:   DM type 2 with diabetic background retinopathy (HCC) Hemoglobin A1c today stable at 6.1.  We will continue on current regimen.  Refill sent for metformin.  Follow-up in 3 to 6 months for repeat hemoglobin A1c  HYPERTENSION, BENIGN SYSTEMIC Patient currently on Imdur as well as Hyzaar.  Blood pressure well controlled on these medications.  Refill sent for Imdur.  No further questions or concerns.     Gifford Shave, MD [place]

## 2021-03-15 NOTE — Assessment & Plan Note (Signed)
Hemoglobin A1c today stable at 6.1.  We will continue on current regimen.  Refill sent for metformin.  Follow-up in 3 to 6 months for repeat hemoglobin A1c

## 2021-03-15 NOTE — Assessment & Plan Note (Signed)
Patient currently on Imdur as well as Hyzaar.  Blood pressure well controlled on these medications.  Refill sent for Imdur.  No further questions or concerns.

## 2021-04-10 ENCOUNTER — Other Ambulatory Visit: Payer: Self-pay

## 2021-04-10 ENCOUNTER — Ambulatory Visit (INDEPENDENT_AMBULATORY_CARE_PROVIDER_SITE_OTHER): Payer: HMO | Admitting: Family Medicine

## 2021-04-10 ENCOUNTER — Ambulatory Visit
Admission: RE | Admit: 2021-04-10 | Discharge: 2021-04-10 | Disposition: A | Discharge status: Home / Self Care | Payer: BC Managed Care – PPO | Source: Ambulatory Visit | Attending: Family Medicine | Admitting: Family Medicine

## 2021-04-10 VITALS — BP 149/73 | HR 71 | Wt 194.0 lb

## 2021-04-10 DIAGNOSIS — M79672 Pain in left foot: Secondary | ICD-10-CM

## 2021-04-10 DIAGNOSIS — Z13820 Encounter for screening for osteoporosis: Secondary | ICD-10-CM | POA: Diagnosis not present

## 2021-04-10 NOTE — Assessment & Plan Note (Signed)
Acute, one week duration. Concern for metatarsal fracture of 1st-2nd metatarsal heads. Ordered complete left foot XR. Recommend stiff soled shoe OTC. Return precautions given. Will await results, likely refer to ortho or podiatry.  ?

## 2021-04-10 NOTE — Progress Notes (Signed)
? ? ?SUBJECTIVE:  ? ?CHIEF COMPLAINT / HPI:  ? ?Left foot pain ?- one week duration ?- stepped out of bed, felt pain over arch of foot, area immediately swelled ?- has been able to ambulate, no specific pain with ambulation ?- sore throughout the day ?- no known trauma or accidents involving the foot ?- never happened before ?- has had fractures in past; fell and broke left toes ?- no known osteoporosis, but no DEXA on file ? ?PERTINENT  PMH / PSH:  ?Patient Active Problem List  ? Diagnosis Date Noted  ? Left foot pain 04/10/2021  ? Screening for osteoporosis 04/10/2021  ? Nausea 07/26/2020  ? Labial lesion 05/24/2020  ? Ear pain, bilateral 02/25/2020  ? Constipation 01/11/2020  ? Chronic left shoulder pain 12/13/2019  ? Paresthesia of lip 12/13/2019  ? Rotator cuff injury 11/06/2019  ? Depression, major, single episode, mild ([place]) 08/20/2019  ? Sinus pressure 03/18/2019  ? Grief 03/18/2019  ? Chronic bilateral thoracic back pain 12/15/2018  ? Need for immunization against influenza 11/13/2018  ? Spinal stenosis of lumbar region 10/16/2018  ? Chronic bilateral low back pain without sciatica 07/30/2018  ? Chest congestion 03/13/2018  ? Chest pain 01/26/2018  ? Bradycardia 01/26/2018  ? Stress at home 11/19/2017  ? Lower extremity edema 09/30/2016  ? Swelling of lower extremity 09/16/2016  ? Right hip pain 03/19/2016  ? Microcytic anemia 10/05/2015  ? Meralgia paraesthetica 10/04/2015  ? Atypical lymphocytosis 09/21/2015  ? Left arm weakness 08/18/2015  ? Vertigo 12/27/2014  ? Greater trochanteric bursitis of right hip 07/06/2013  ? Muscle spasm 11/05/2012  ? Heart palpitations 05/01/2012  ? Chronic sinusitis 09/02/2011  ? Back pain 07/20/2009  ? RUQ pain 02/24/2009  ? GERD 06/05/2006  ? DM type 2 with diabetic background retinopathy ([place]) 04/03/2006  ? Hyperlipidemia associated with type 2 diabetes mellitus ([place]) 04/03/2006  ? OBESITY, NOS 04/03/2006  ? Iron deficiency anemia 04/03/2006  ? HYPERTENSION, BENIGN  SYSTEMIC 04/03/2006  ? MITRAL VALVE PROLAPSE 04/03/2006  ? OSTEOARTHRITIS, MULTI SITES 04/03/2006  ? Proteinuria 04/03/2006  ?  ? ?OBJECTIVE:  ? ?BP (!) 149/73   Pulse 71   Wt 194 lb (88 kg)   SpO2 97%   BMI 33.30 kg/m?   ?PHQ-9:  ?Depression screen [facility] 2/9 03/14/2021 12/08/2020 10/04/2020  ?Decreased Interest 0 (No Data) 0  ?Down, Depressed, Hopeless 1 - 1  ?PHQ - 2 Score 1 - 1  ?Altered sleeping 0 - 0  ?Tired, decreased energy 1 - 0  ?Change in appetite 0 - 2  ?Feeling bad or failure about yourself  0 - 0  ?Trouble concentrating 1 - 0  ?Moving slowly or fidgety/restless 0 - 0  ?Suicidal thoughts 0 - 0  ?PHQ-9 Score 3 - 3  ?Difficult doing work/chores Somewhat difficult - -  ?Some recent data might be hidden  ?  ?GAD-7: No flowsheet data found. ?  ?Physical Exam ?General: Awake, alert, oriented, no acute distress ?Respiratory: Unlabored respirations, speaking in full sentences, no respiratory distress ?Extremities: Moving all extremities spontaneously, TTP and swelling overlying left 1st-2nd metatarsal heads, no erythema or fluctuance appreciated ? ?ASSESSMENT/PLAN:  ? ?Left foot pain ?Acute, one week duration. Concern for metatarsal fracture of 1st-2nd metatarsal heads. Ordered complete left foot XR. Recommend stiff soled shoe OTC. Return precautions given. Will await results, likely refer to ortho or podiatry.  ? ?Screening for osteoporosis ?Given likely fragility fracture of left 1st-2nd metatarsal heads and no previous DEXA on file,  will order DEXA to be completed at her convenience. Discussed with patient, all questions answered.  ?  ? ? ?Ezequiel Essex, MD ?[place]  ?

## 2021-04-10 NOTE — Assessment & Plan Note (Signed)
Given likely fragility fracture of left 1st-2nd metatarsal heads and no previous DEXA on file, will order DEXA to be completed at her convenience. Discussed with patient, all questions answered.  ?

## 2021-04-10 NOTE — Patient Instructions (Signed)
It was wonderful to see you today. Thank you for allowing me to be a part of your care. Below is a short summary of what we discussed at your visit today: ? ?Foot pain ?- I am concerned that you broke a bone in the middle of your foot.  ?- Obtain a stiff soled shoe from a medical supply store or pharmacy of your choosing. Below are examples. Wear this every day until we know what is going on.  ?- Go to the [facility] for x-rays of your foot as soon as possible.  ? ? ? ?[facility] ?[address], [place] [id number] ?[phone] ?Mon-Fri 8-5 ? ? ?  ?Osteoporosis screening ?Based on the possibility of this fracture, I believe we need to screen you for osteoporosis. This test (DEXA scan) is done at the [facility]. Please call them for an appointment at your convenience.  ? ?  ? ? ? ?Please bring all of your medications to every appointment! ? ?If you have any questions or concerns, please do not hesitate to contact us via phone or MyChart message.  ? ?Ezequiel Essex, MD  ?

## 2021-04-10 NOTE — Progress Notes (Deleted)
? ? ?  SUBJECTIVE:  ? ?CHIEF COMPLAINT / HPI:  ? ?*** ?Foot pain ?Tuesday ? ? ?PERTINENT  PMH / PSH:  ?Patient Active Problem List  ? Diagnosis Date Noted  ? Nausea 07/26/2020  ? Labial lesion 05/24/2020  ? Ear pain, bilateral 02/25/2020  ? Constipation 01/11/2020  ? Chronic left shoulder pain 12/13/2019  ? Paresthesia of lip 12/13/2019  ? Rotator cuff injury 11/06/2019  ? Depression, major, single episode, mild ([place]) 08/20/2019  ? Sinus pressure 03/18/2019  ? Grief 03/18/2019  ? Chronic bilateral thoracic back pain 12/15/2018  ? Need for immunization against influenza 11/13/2018  ? Spinal stenosis of lumbar region 10/16/2018  ? Chronic bilateral low back pain without sciatica 07/30/2018  ? Chest congestion 03/13/2018  ? Chest pain 01/26/2018  ? Bradycardia 01/26/2018  ? Stress at home 11/19/2017  ? Lower extremity edema 09/30/2016  ? Swelling of lower extremity 09/16/2016  ? Right hip pain 03/19/2016  ? Microcytic anemia 10/05/2015  ? Meralgia paraesthetica 10/04/2015  ? Atypical lymphocytosis 09/21/2015  ? Left arm weakness 08/18/2015  ? Vertigo 12/27/2014  ? Greater trochanteric bursitis of right hip 07/06/2013  ? Muscle spasm 11/05/2012  ? Heart palpitations 05/01/2012  ? Chronic sinusitis 09/02/2011  ? Back pain 07/20/2009  ? RUQ pain 02/24/2009  ? GERD 06/05/2006  ? DM type 2 with diabetic background retinopathy ([place]) 04/03/2006  ? Hyperlipidemia associated with type 2 diabetes mellitus ([place]) 04/03/2006  ? OBESITY, NOS 04/03/2006  ? Iron deficiency anemia 04/03/2006  ? HYPERTENSION, BENIGN SYSTEMIC 04/03/2006  ? MITRAL VALVE PROLAPSE 04/03/2006  ? OSTEOARTHRITIS, MULTI SITES 04/03/2006  ? Proteinuria 04/03/2006  ?  ? ?OBJECTIVE:  ? ?BP (!) 149/73   Pulse 71   Wt 194 lb (88 kg)   SpO2 97%   BMI 33.30 kg/m?   ?*** ? ?ASSESSMENT/PLAN:  ? ?No problem-specific Assessment & Plan notes found for this encounter. ?  ?Foot x-ray ?Stiff soled shoe OTC ?Pending referral to podiatary or ortho beased on XR ?DEXA scan at  convenience  ? ?Ezequiel Essex, MD ?[place]  ?

## 2021-04-13 ENCOUNTER — Telehealth: Payer: Self-pay

## 2021-04-13 NOTE — Telephone Encounter (Signed)
Patient calls nurse line requesting recent imaging results.  ? ?Will forward to ordering provider.  ?

## 2021-04-13 NOTE — Telephone Encounter (Signed)
Called patient to discuss results. I believe I called the patient earlier and left a voicemail, but unfortunately there is no documentation of that. Discussed results with patient, including no acute fractures but evidence of chronic degenerative changes. I did place the referral to podiatry on 3/07 and the patient has already been scheduled an appointment.  ? ?Amber Essex, MD ? ? ?

## 2021-04-18 ENCOUNTER — Other Ambulatory Visit: Payer: Self-pay | Admitting: Family Medicine

## 2021-05-09 ENCOUNTER — Ambulatory Visit (INDEPENDENT_AMBULATORY_CARE_PROVIDER_SITE_OTHER): Payer: Self-pay | Admitting: Podiatry

## 2021-05-09 DIAGNOSIS — Z91199 Patient's noncompliance with other medical treatment and regimen due to unspecified reason: Secondary | ICD-10-CM

## 2021-05-09 NOTE — Progress Notes (Signed)
No show

## 2021-05-14 ENCOUNTER — Ambulatory Visit (INDEPENDENT_AMBULATORY_CARE_PROVIDER_SITE_OTHER): Payer: HMO | Admitting: Family Medicine

## 2021-05-14 ENCOUNTER — Ambulatory Visit (HOSPITAL_COMMUNITY)
Admission: RE | Admit: 2021-05-14 | Discharge: 2021-05-14 | Disposition: A | Discharge status: Home / Self Care | Payer: HMO | Source: Ambulatory Visit | Attending: Family Medicine | Admitting: Family Medicine

## 2021-05-14 VITALS — BP 152/76 | HR 76 | Ht 64.0 in | Wt 193.4 lb

## 2021-05-14 DIAGNOSIS — M79674 Pain in right toe(s): Secondary | ICD-10-CM

## 2021-05-14 DIAGNOSIS — M7731 Calcaneal spur, right foot: Secondary | ICD-10-CM | POA: Diagnosis not present

## 2021-05-14 NOTE — Patient Instructions (Signed)
I would like for you to go to [facility] to get x-rays of the right foot and toe.  I have sent this request to them and you should build to walk in the door and state that you are there for x-rays. ? ?I would like to make a follow-up appointment with [name] later this week to see how you are doing.  If you start develop fevers, if the pain significantly worsens, or if you get any other concerns please seek help sooner.  You can use Tylenol in the meantime. ?

## 2021-05-14 NOTE — Progress Notes (Signed)
? ? ?  SUBJECTIVE:  ? ?CHIEF COMPLAINT / HPI:  ? ?"Pinky toe pain": ?Started 1.5 weeks ago. She has tried soaking it in warm water. States that she has some numbness in that toe but noticed the pain was worsening as well as the swelling. Swelling started about a week ago. She noticed it felt warm to the touch. Denies fevers. Last A1C 2 months ago of 6.1.  She does not recall any trauma. ? ?PERTINENT  PMH / PSH: Well-controlled type 2 diabetes ? ?OBJECTIVE:  ? ?BP (!) 152/76   Pulse 76   Ht '5\' 4"'$  (1.626 m)   Wt 193 lb 6 oz (87.7 kg)   SpO2 100%   BMI 33.19 kg/m?   ? ?General: NAD, pleasant, able to participate in exam ?Respiratory: No respiratory distress ?MSK: Right fifth digit is swollen and painful to the touch.  There is a small callus on the lateral aspect which does not look erythematous.  The joint does have warmth to it as well as some warmth to the midfoot compared to the medial aspect.  There is no obvious cellulitic changes to the skin. ?Psych: Normal affect and mood ? ? ? ? ?ASSESSMENT/PLAN:  ? ?Right fifth toe pain: ?Started about a week or week and a half ago.  She does think she has a history of gout but denies any trauma that she can recall to the toe.  She noticed that the toe was swelling more about a week ago and that the pain seems to be persistent.  She did notice a bit of warmth to the toe.  On visual inspection the toe is swollen but not erythematous and does not appear cellulitic.  She does have some warmth when palpating the toe as well as the midfoot next to the toe joint.  There is a callus present but it does not look overly infected.  Differential can include occult fracture/trauma, osteo or other infection by think this would be less likely without an open area to the foot or any overlying cellulitic changes, gout is a possibility.  We will start the work-up with x-ray.  X-ray should show chronic osteomyelitis and can look for any fractures as a cause of her pain.  Will use Tylenol  in the meantime to control pain.  She is going to follow-up with [name] in a few days.  Discussed return precautions if symptoms worsen in the meantime ? ? ?Lurline Del, DO ?[place]  ?

## 2021-05-29 ENCOUNTER — Other Ambulatory Visit: Payer: Self-pay | Admitting: Family Medicine

## 2021-05-29 DIAGNOSIS — I10 Essential (primary) hypertension: Secondary | ICD-10-CM

## 2021-06-09 ENCOUNTER — Other Ambulatory Visit: Payer: Self-pay | Admitting: Family Medicine

## 2021-06-13 ENCOUNTER — Telehealth: Payer: Self-pay

## 2021-06-13 ENCOUNTER — Other Ambulatory Visit (HOSPITAL_COMMUNITY): Payer: Self-pay

## 2021-06-13 NOTE — Telephone Encounter (Signed)
Pts pharmacy sent PA request for Accu Chek Guide Test strips.  ? ?Insurance rejections states: Please use preferred diabetic supplies such as OneTouch for BGM and Freestyle Libre for CGM. ? ?Would you like to change the rx or move forward with PA? ?

## 2021-06-14 MED ORDER — GLUCOSE BLOOD VI STRP: ORAL_STRIP | 12 refills | Status: DC

## 2021-06-14 NOTE — Addendum Note (Signed)
Addended by: Concepcion Living on: 06/14/2021 07:11 AM ? ? Modules accepted: Orders ? ?

## 2021-06-20 ENCOUNTER — Ambulatory Visit (INDEPENDENT_AMBULATORY_CARE_PROVIDER_SITE_OTHER): Payer: Self-pay | Admitting: Podiatry

## 2021-06-20 DIAGNOSIS — Z91199 Patient's noncompliance with other medical treatment and regimen due to unspecified reason: Secondary | ICD-10-CM

## 2021-06-20 NOTE — Progress Notes (Signed)
No show

## 2021-06-26 ENCOUNTER — Ambulatory Visit: Payer: HMO | Admitting: Family Medicine

## 2021-07-20 ENCOUNTER — Telehealth: Payer: Self-pay

## 2021-07-20 NOTE — Telephone Encounter (Signed)
Prior Auth for patients medication ACCU CHEK GUIDE STRIPS approved by [employer] from 07/20/21 to 02/03/22.  APPROVED UNDER PART B ONLY  Key: [id number]  Patients pharmacy notified... pt picked up One Touch brand strips 06/14/21. Disregard PA.

## 2021-07-20 NOTE — Telephone Encounter (Signed)
A Prior Authorization was initiated for this patients ACCU-CHEK GUIDE TEST STRIPS through CoverMyMeds.   Key: [id number]

## 2021-08-16 ENCOUNTER — Ambulatory Visit (INDEPENDENT_AMBULATORY_CARE_PROVIDER_SITE_OTHER): Payer: HMO | Admitting: Family Medicine

## 2021-08-16 VITALS — BP 145/75 | HR 81 | Ht 64.0 in | Wt 189.8 lb

## 2021-08-16 DIAGNOSIS — E113299 Type 2 diabetes mellitus with mild nonproliferative diabetic retinopathy without macular edema, unspecified eye: Secondary | ICD-10-CM | POA: Diagnosis not present

## 2021-08-16 DIAGNOSIS — R202 Paresthesia of skin: Secondary | ICD-10-CM

## 2021-08-16 LAB — POCT GLYCOSYLATED HEMOGLOBIN (HGB A1C): HbA1c, POC (controlled diabetic range): 6.1 % (ref 0.0–7.0)

## 2021-08-16 MED ORDER — CAPSAICIN 0.033 % EX CREA
TOPICAL_CREAM | CUTANEOUS | 0 refills | Status: DC
Start: 2021-08-16 — End: 2021-09-05

## 2021-08-16 NOTE — Progress Notes (Cosign Needed)
    SUBJECTIVE:   CHIEF COMPLAINT / HPI:   Patient presents to discuss abnormal sensation over extremities. Feels like pins are sticking her over her arms, thighs, and chest area for the past week. Denies any rash or bites. Denies loss of sensation. Tried applying rubbing alcohol without relief. Denies any new medications.   Type 2 diabetes Last a1c 6.1 10 months ago Fasting am blood sugar in the low 100s Takes metformin 500 BID  PERTINENT  PMH / PSH: Reviewed   OBJECTIVE:   BP (!) 145/75   Pulse 81   Ht '5\' 4"'$  (1.626 m)   Wt 189 lb 12.8 oz (86.1 kg)   SpO2 99%   BMI 32.58 kg/m    Physical exam General: well appearing, NAD Cardiovascular: RRR, no murmurs Lungs: CTAB. Normal WOB Abdomen: soft, non-distended, non-tender Skin: warm, dry. No visible rashes or lesions.  Normal sensation of extremities bilaterally Neuro: alert and oriented. CN 2-12 in tact.  Normal sensation of extremities bilaterally  ASSESSMENT/PLAN:   Paresthesia Occurring over arms, thighs and chest for the past week. Unknown etiology. Will check TSH, RPR, vitamin B12, BMP, CBC. Prescribe capsaicin cream to use on affected areas up to 4 times a day.  Recommended following up if no improvement in the next couple of weeks, and can consider starting an oral medication such as Gabapentin if needed.    DM type 2 with diabetic background retinopathy (HCC) A1c 6.1 today. Takes metformin 500 BID. Will continue current regimen     [place]

## 2021-08-16 NOTE — Patient Instructions (Addendum)
It was great seeing you today!  Today you came in for the needle sensations, and we are checking some blood work.  I will call you if anything is abnormal we will send a letter if normal.  Have also prescribed a cream to use when you get the sensations only on the affected areas up to 4 times a day.  Return if this does not improve over the next couple of weeks and we can consider a different medication. but if you need to be seen earlier than that for any new issues we're happy to fit you in, just give [name] a call!  Visit Reminders: - Stop by the pharmacy to pick up your prescriptions  - Continue to work on your healthy eating habits and incorporating exercise into your daily life.   Feel free to call with any questions or concerns at any time, at [phone].   Take care,  Dr. Shary Key [facility]

## 2021-08-17 LAB — VITAMIN B12: Vitamin B-12: 433 pg/mL (ref 232–1245)

## 2021-08-17 LAB — CBC
Hematocrit: 35.5 % (ref 34.0–46.6)
Hemoglobin: 11.2 g/dL (ref 11.1–15.9)
MCH: 24.7 pg — ABNORMAL LOW (ref 26.6–33.0)
MCHC: 31.5 g/dL (ref 31.5–35.7)
MCV: 78 fL — ABNORMAL LOW (ref 79–97)
Platelets: 308 10*3/uL (ref 150–450)
RBC: 4.53 x10E6/uL (ref 3.77–5.28)
RDW: 13.8 % (ref 11.7–15.4)
WBC: 8.6 10*3/uL (ref 3.4–10.8)

## 2021-08-17 LAB — BASIC METABOLIC PANEL
BUN/Creatinine Ratio: 14 (ref 12–28)
BUN: 16 mg/dL (ref 8–27)
CO2: 25 mmol/L (ref 20–29)
Calcium: 9.7 mg/dL (ref 8.7–10.3)
Chloride: 102 mmol/L (ref 96–106)
Creatinine, Ser: 1.11 mg/dL — ABNORMAL HIGH (ref 0.57–1.00)
Glucose: 114 mg/dL — ABNORMAL HIGH (ref 70–99)
Potassium: 4.2 mmol/L (ref 3.5–5.2)
Sodium: 142 mmol/L (ref 134–144)
eGFR: 50 mL/min/{1.73_m2} — ABNORMAL LOW (ref >59)

## 2021-08-17 LAB — HIV ANTIBODY (ROUTINE TESTING W REFLEX): HIV Screen 4th Generation wRfx: NONREACTIVE

## 2021-08-17 LAB — LIPID PANEL
Chol/HDL Ratio: 3.9 ratio (ref 0.0–4.4)
Cholesterol, Total: 181 mg/dL (ref 100–199)
HDL: 46 mg/dL (ref >39)
LDL Chol Calc (NIH): 103 mg/dL — ABNORMAL HIGH (ref 0–99)
Triglycerides: 184 mg/dL — ABNORMAL HIGH (ref 0–149)
VLDL Cholesterol Cal: 32 mg/dL (ref 5–40)

## 2021-08-17 LAB — RPR: RPR Ser Ql: NONREACTIVE

## 2021-08-17 LAB — TSH: TSH: 3.99 u[IU]/mL (ref 0.450–4.500)

## 2021-08-18 DIAGNOSIS — R202 Paresthesia of skin: Secondary | ICD-10-CM | POA: Insufficient documentation

## 2021-08-18 NOTE — Assessment & Plan Note (Signed)
A1c 6.1 today. Takes metformin 500 BID. Will continue current regimen

## 2021-08-18 NOTE — Assessment & Plan Note (Signed)
Occurring over arms, thighs and chest for the past week. Unknown etiology. Will check TSH, RPR, vitamin B12, BMP, CBC. Prescribe capsaicin cream to use on affected areas up to 4 times a day.  Recommended following up if no improvement in the next couple of weeks, and can consider starting an oral medication such as Gabapentin if needed.

## 2021-08-24 ENCOUNTER — Telehealth: Payer: Self-pay

## 2021-08-24 NOTE — Telephone Encounter (Signed)
Patient calls nurse line requesting lab results from 7/13.  Will forward to ordering provider.

## 2021-08-28 ENCOUNTER — Other Ambulatory Visit: Payer: Self-pay | Admitting: Family Medicine

## 2021-08-28 DIAGNOSIS — I10 Essential (primary) hypertension: Secondary | ICD-10-CM

## 2021-09-04 ENCOUNTER — Telehealth: Payer: Self-pay | Admitting: Family Medicine

## 2021-09-04 NOTE — Telephone Encounter (Signed)
**  After Hours/ Emergency Line Call**  Received a call to report that Amber [name] is feeling like something is "crawling all over me" and she feels "sick to my stomach". Has been going on for the last 5 days. She feels like there are pins sticking in her. She is wondering if it could be related to her nerves. She denies difficulty breathing. No chest pain.  She was seen for similar symptoms in mid-July. States that the symptoms improved and then came back. Denies weakness. Sometimes feels off-balance but that "doesn't last too long". No speech problems. No falls. No red flags at this time. Patient is already scheduled for in appointment in our clinic tomorrow morning. Feel that patient is appropriate to wait until appointment for further evaluation.   Discussed if she develops weakness, balance issues, speech changes, chest pain, SOB, or her symptoms become intolerable she should seek care immediately.   Will forward to Dr. Jinny Sanders.   Sharion Settler, DO PGY-3, [facility] 09/04/2021 5:28 PM

## 2021-09-05 ENCOUNTER — Encounter: Payer: Self-pay | Admitting: Student

## 2021-09-05 ENCOUNTER — Ambulatory Visit (INDEPENDENT_AMBULATORY_CARE_PROVIDER_SITE_OTHER): Payer: HMO | Admitting: Student

## 2021-09-05 VITALS — BP 148/72 | HR 78 | Ht 64.0 in | Wt 187.2 lb

## 2021-09-05 DIAGNOSIS — R202 Paresthesia of skin: Secondary | ICD-10-CM | POA: Diagnosis not present

## 2021-09-05 DIAGNOSIS — L299 Pruritus, unspecified: Secondary | ICD-10-CM | POA: Diagnosis not present

## 2021-09-05 MED ORDER — CETIRIZINE HCL 10 MG PO TABS: 10.0000 mg | ORAL_TABLET | Freq: Every day | ORAL | 1 refills | Status: DC

## 2021-09-05 NOTE — Assessment & Plan Note (Signed)
Continues to have itching/pins-and-needles all symptoms.  Possible that this is related to neuropathic pain although unusual that it is all over her body.  She feels like bugs are crawling under her but there are no signs of lesions/rash or excoriations.  Psychiatric cause possible as well given patient is not on SSRI currently and is not wanting to be however says that her anxiety comes after she gets the itching sensation.  She denies any methamphetamine/drug use.  Possible that there is gallbladder/liver issues as she only got a BMP last time we will recheck a CMP today.  We will check CBC with differential as well today as she has had a history of elevated lymphocyte absolute count in the past. -Zyrtec 10 mg nightly -CMP and CBC with differential -monitor for improvement in 1 mo

## 2021-09-05 NOTE — Progress Notes (Signed)
    SUBJECTIVE:   CHIEF COMPLAINT / HPI:   Parasthesias Patient says for the past month she has had issues with itching/feeling like something is crawling all over her.  She says that it improved for a little bit after her last visit with Dr. Arby Barrette but for the past 5 days has had the same pins-and-needles sensation and itching.  She mentions that it could be related to her "nerves" she says that she was on a medication for nerves previously.  When asked what this was patient did not know.  She says that sometimes she has anxiety but it is usually the itching that comes first and then anxiety over what this is afterwards.  The capsaicin cream has not been helping her at all.  She says that she has been using Dial antibiotic soap all over her body, and having bubble baths.  She sometimes puts Vaseline on the area as well.  She does have pins-and-needles sensation in her fingers that started last month as well.  She feels this pins and needle sensation in her arms legs and back.  She has not seen any rashes.  She is worried about shingles however she says that it has been all over her body and not in one location that she is having her itching/pins and needle sensation.  She denies any abdominal pain or vomiting.  She denies any alcohol use and says she has not used alcohol in 40 years.  She denies any methamphetamine or drug use.  She denies any smoking.  PERTINENT  PMH / PSH: T2DM, history of depression  OBJECTIVE:  BP (!) 148/72   Pulse 78   Ht '5\' 4"'$  (1.626 m)   Wt 187 lb 3.2 oz (84.9 kg)   SpO2 99%   BMI 32.13 kg/m   General: NAD, awake, alert, responsive to questions Head: Normocephalic atraumatic neck ROM intact, no radicular pain with movements CV: Regular rate and rhythm no murmurs rubs or gallops Respiratory: Clear to ausculation bilaterally no increased work of breathing Abdomen: Soft, non-tender, non-distended, normoactive bowel sounds, murphy's sign negative Extremities: Moves upper  and lower extremities freely, no edema in LE Skin: No rashes or lesions visualized, intermittent hypopigmented spots over body (not new), no excoriation marks  ASSESSMENT/PLAN:   Paresthesia Continues to have itching/pins-and-needles all symptoms.  Possible that this is related to neuropathic pain although unusual that it is all over her body.  She feels like bugs are crawling under her but there are no signs of lesions/rash or excoriations.  Psychiatric cause possible as well given patient is not on SSRI currently and is not wanting to be however says that her anxiety comes after she gets the itching sensation.  She denies any methamphetamine/drug use.  Possible that there is gallbladder/liver issues as she only got a BMP last time we will recheck a CMP today.  We will check CBC with differential as well today as she has had a history of elevated lymphocyte absolute count in the past. -Zyrtec 10 mg nightly -CMP and CBC with differential -monitor for improvement in 1 mo    Gerrit Heck, MD [place]

## 2021-09-05 NOTE — Patient Instructions (Addendum)
It was great to see you! Thank you for allowing me to participate in your care!   I recommend that you always bring your medications to each appointment as this makes it easy to ensure we are on the correct medications and helps [name] not miss when refills are needed.  Our plans for today:  - oatmeal baths, no dial soap-use dove soap - vaseline-keep with her - zyrtec 10 mg nightly  We are checking some labs today, I will call you if they are abnormal will send you a MyChart message or a letter if they are normal.  If you do not hear about your labs in the next 2 weeks please let us know.  Take care and seek immediate care sooner if you develop any concerns. Please remember to show up 15 minutes before your scheduled appointment time!  Gerrit Heck, MD [place]

## 2021-09-06 ENCOUNTER — Encounter: Payer: Self-pay | Admitting: Student

## 2021-09-06 LAB — COMPREHENSIVE METABOLIC PANEL
ALT: 14 IU/L (ref 0–32)
AST: 15 IU/L (ref 0–40)
Albumin/Globulin Ratio: 1.3 (ref 1.2–2.2)
Albumin: 4.3 g/dL (ref 3.7–4.7)
Alkaline Phosphatase: 66 IU/L (ref 44–121)
BUN/Creatinine Ratio: 15 (ref 12–28)
BUN: 15 mg/dL (ref 8–27)
Bilirubin Total: 0.3 mg/dL (ref 0.0–1.2)
CO2: 26 mmol/L (ref 20–29)
Calcium: 10.1 mg/dL (ref 8.7–10.3)
Chloride: 101 mmol/L (ref 96–106)
Creatinine, Ser: 1.02 mg/dL — ABNORMAL HIGH (ref 0.57–1.00)
Globulin, Total: 3.3 g/dL (ref 1.5–4.5)
Glucose: 170 mg/dL — ABNORMAL HIGH (ref 70–99)
Potassium: 4.1 mmol/L (ref 3.5–5.2)
Sodium: 142 mmol/L (ref 134–144)
Total Protein: 7.6 g/dL (ref 6.0–8.5)
eGFR: 55 mL/min/{1.73_m2} — ABNORMAL LOW (ref >59)

## 2021-09-06 LAB — CBC WITH DIFFERENTIAL/PLATELET
Basophils Absolute: 0 10*3/uL (ref 0.0–0.2)
Basos: 1 %
EOS (ABSOLUTE): 0.3 10*3/uL (ref 0.0–0.4)
Eos: 4 %
Hematocrit: 35.7 % (ref 34.0–46.6)
Hemoglobin: 11.5 g/dL (ref 11.1–15.9)
Immature Grans (Abs): 0 10*3/uL (ref 0.0–0.1)
Immature Granulocytes: 0 %
Lymphocytes Absolute: 3.4 10*3/uL — ABNORMAL HIGH (ref 0.7–3.1)
Lymphs: 48 %
MCH: 25.2 pg — ABNORMAL LOW (ref 26.6–33.0)
MCHC: 32.2 g/dL (ref 31.5–35.7)
MCV: 78 fL — ABNORMAL LOW (ref 79–97)
Monocytes Absolute: 0.5 10*3/uL (ref 0.1–0.9)
Monocytes: 7 %
Neutrophils Absolute: 2.8 10*3/uL (ref 1.4–7.0)
Neutrophils: 40 %
Platelets: 314 10*3/uL (ref 150–450)
RBC: 4.56 x10E6/uL (ref 3.77–5.28)
RDW: 13.6 % (ref 11.7–15.4)
WBC: 7.1 10*3/uL (ref 3.4–10.8)

## 2021-09-25 ENCOUNTER — Inpatient Hospital Stay: Admission: RE | Admit: 2021-09-25 | Payer: HMO | Source: Ambulatory Visit

## 2021-11-14 DIAGNOSIS — H04123 Dry eye syndrome of bilateral lacrimal glands: Secondary | ICD-10-CM | POA: Diagnosis not present

## 2021-11-14 DIAGNOSIS — H40053 Ocular hypertension, bilateral: Secondary | ICD-10-CM | POA: Diagnosis not present

## 2021-11-16 ENCOUNTER — Ambulatory Visit: Payer: HMO | Admitting: Student

## 2021-11-19 ENCOUNTER — Ambulatory Visit (INDEPENDENT_AMBULATORY_CARE_PROVIDER_SITE_OTHER): Payer: HMO | Admitting: Family Medicine

## 2021-11-19 DIAGNOSIS — R1011 Right upper quadrant pain: Secondary | ICD-10-CM

## 2021-11-19 NOTE — Patient Instructions (Addendum)
It was great seeing you today!  You came in for right upper stomach pain that worsens when you move your arms above head which seems like you may have strained a muscle. As we discussed you can take Tylenol as you need to and can alternate between ice and heat. Continue staying mobile, walking and stretching.   Let us know if after 2 weeks there is no improvement or let us know sooner if it worsens.   Feel free to call with any questions or concerns at any time, at [phone].   Take care,  Dr. Shary Key [facility]

## 2021-11-19 NOTE — Progress Notes (Unsigned)
    SUBJECTIVE:   CHIEF COMPLAINT / HPI:   Patient presents for right sided abdominal pain   Endorses some pain in her right upper quadrant. States it started Friday. Was cleaning the house and noticed. States its a mild aching that comes and goes. Denies pain when she eats. If she stretches her arms in the air feels a pulling. Denies any pain while shes sitting down.   HTN: 152/68. Wonders why her BP is high when she comes here Currently on Losartan and HCTZ 100/12.5 and Imdur '30mg'$  daily  Watching what she eats    PERTINENT  PMH / PSH: Reviewed   OBJECTIVE:   BP (!) 152/68   Pulse 77   Wt 186 lb 9.6 oz (84.6 kg)   SpO2 97%   BMI 32.03 kg/m    Physical exam General: well appearing, NAD Cardiovascular: RRR, no murmurs Lungs: CTAB. Normal WOB Abdomen: soft, non-distended, non-tender Skin: warm, dry. No edema  ASSESSMENT/PLAN:   No problem-specific Assessment & Plan notes found for this encounter.   Repeat BP not improved   [place]

## 2021-11-20 DIAGNOSIS — R109 Unspecified abdominal pain: Secondary | ICD-10-CM | POA: Insufficient documentation

## 2021-11-20 NOTE — Assessment & Plan Note (Signed)
Patient presented with right sided abdominal pain x 3 days worse with movement. Given this occurred after doing house work and not related to eating and with normal Bms highly suspect musculoskeletal in nature. Physical exam remarkable for tenderness in RUQ and R flank area worse when she raises her arms. Unlikely to be gall bladder given no relation to pain and eating. Recommended Tylenol for pain, alternating between ice and heat and stretches. Return precautions discussed if no improvement after 2 weeks or worsening of symptoms.

## 2022-01-08 ENCOUNTER — Ambulatory Visit (INDEPENDENT_AMBULATORY_CARE_PROVIDER_SITE_OTHER): Payer: HMO | Admitting: Family Medicine

## 2022-01-08 VITALS — BP 139/70 | HR 97 | Ht 64.0 in | Wt 185.6 lb

## 2022-01-08 DIAGNOSIS — B349 Viral infection, unspecified: Secondary | ICD-10-CM

## 2022-01-08 DIAGNOSIS — R42 Dizziness and giddiness: Secondary | ICD-10-CM

## 2022-01-08 MED ORDER — ONDANSETRON HCL 4 MG PO TABS: 4.0000 mg | ORAL_TABLET | Freq: Three times a day (TID) | ORAL | 0 refills | Status: DC | PRN

## 2022-01-08 NOTE — Patient Instructions (Addendum)
It was nice seeing you today!  Take Zofran every 8 hours as needed for nausea.  Try to stay well-hydrated as best you can.  For now, I would recommend that you stop taking your losartan-hydrochlorothiazide (Hyzaar) and you can restart this medication once you are feeling better.  Please give [name] a call if you are not getting better or are feeling worse in the next several days.  Stay well, Zola Button, MD [place] [phone]  --  Make sure to check out at the front desk before you leave today.  Please arrive at least 15 minutes prior to your scheduled appointments.  If you had blood work today, I will send you a MyChart message or a letter if results are normal. Otherwise, I will give you a call.  If you had a referral placed, they will call you to set up an appointment. Please give [name] a call if you don't hear back in the next 2 weeks.  If you need additional refills before your next appointment, please call your pharmacy first.

## 2022-01-08 NOTE — Progress Notes (Signed)
    SUBJECTIVE:   CHIEF COMPLAINT / HPI:  Chief Complaint  Patient presents with   Nausea   Dizziness    Patient started feeling ill about 1 week ago with cough, congestion, and dizziness/lightheadedness which is intermittent and described as worse when standing up and bending over.  No room spinning sensation.  She started feeling nauseous about 3 to 4 days ago and has not been eating or drinking as much.  Otherwise she has been having normal bowel movements and voiding normally.  Denies fever, chills, abdominal pain, chest pain, shortness of breath.  No known sick contacts.  She reports her blood pressure readings at home have been pretty normal, usually systolic low 154M which has been normal for her over the past several months.  PERTINENT  PMH / PSH: T2DM, GERD, HTN, anemia, vertigo (suspected BPPV)  Patient Care Team: Sharion Settler, DO as PCP - General (Family Medicine)   OBJECTIVE:   BP 139/70   Pulse 97   Ht '5\' 4"'$  (1.626 m)   Wt 185 lb 9.6 oz (84.2 kg)   SpO2 96%   BMI 31.86 kg/m   Physical Exam Constitutional:      General: She is not in acute distress. HENT:     Head: Normocephalic and atraumatic.     Mouth/Throat:     Mouth: Mucous membranes are moist.     Pharynx: Oropharynx is clear. No oropharyngeal exudate or posterior oropharyngeal erythema.  Eyes:     Extraocular Movements: Extraocular movements intact.     Pupils: Pupils are equal, round, and reactive to light.  Cardiovascular:     Rate and Rhythm: Normal rate and regular rhythm.  Pulmonary:     Effort: Pulmonary effort is normal. No respiratory distress.     Breath sounds: Normal breath sounds.  Abdominal:     Palpations: Abdomen is soft.     Tenderness: There is abdominal tenderness.     Comments: Diffuse tenderness  Musculoskeletal:     Cervical back: Neck supple.     Right lower leg: No edema.     Left lower leg: No edema.  Neurological:     General: No focal deficit present.      Mental Status: She is alert.     Cranial Nerves: No cranial nerve deficit.     Motor: No weakness.      Orthostatic VS for the past 24 hrs:  BP- Lying Pulse- Lying BP- Sitting Pulse- Sitting BP- Standing at 0 minutes Pulse- Standing at 0 minutes  01/08/22 1010 168/76 78 141/84 81 141/82 87       {Show previous vital signs (optional):23777}    ASSESSMENT/PLAN:   Viral illness Patient presenting with cough, congestion, dizziness, and nausea ongoing for about 1 week.  Likely due to viral illness, doubt bacterial infection.  Orthostatics positive in office, likely dizziness is from orthostatics due to decreased oral intake. - advised to hold losartan-HCTZ during current illness - Zofran prn for nausea - supportive care, push fluids  Return if symptoms worsen or fail to improve.   Zola Button, MD [place]

## 2022-01-10 ENCOUNTER — Telehealth: Payer: Self-pay

## 2022-01-10 NOTE — Telephone Encounter (Signed)
A Prior Authorization was initiated for this patients ondansetron through CoverMyMeds.   Key: [id number]

## 2022-01-14 ENCOUNTER — Other Ambulatory Visit (HOSPITAL_COMMUNITY): Payer: Self-pay

## 2022-01-14 NOTE — Telephone Encounter (Signed)
Prior Auth for patients medication ONDANSETRON denied by [facility] [employer] via CoverMyMeds.   Reason:   CoverMyMeds Key: [id number]

## 2022-02-28 ENCOUNTER — Ambulatory Visit: Payer: HMO

## 2022-03-01 ENCOUNTER — Encounter: Payer: Self-pay | Admitting: Family Medicine

## 2022-03-01 ENCOUNTER — Ambulatory Visit (INDEPENDENT_AMBULATORY_CARE_PROVIDER_SITE_OTHER): Payer: HMO | Admitting: Family Medicine

## 2022-03-01 VITALS — BP 133/80 | HR 83 | Ht 64.0 in | Wt 185.1 lb

## 2022-03-01 DIAGNOSIS — E113299 Type 2 diabetes mellitus with mild nonproliferative diabetic retinopathy without macular edema, unspecified eye: Secondary | ICD-10-CM | POA: Diagnosis not present

## 2022-03-01 DIAGNOSIS — R200 Anesthesia of skin: Secondary | ICD-10-CM | POA: Diagnosis not present

## 2022-03-01 LAB — POCT GLYCOSYLATED HEMOGLOBIN (HGB A1C): HbA1c, POC (controlled diabetic range): 5.9 % (ref 0.0–7.0)

## 2022-03-01 NOTE — Progress Notes (Signed)
    SUBJECTIVE:   CHIEF COMPLAINT / HPI:   Amber [name] is a 82 y.o. female who presents to the [facility] clinic today to discuss the following concerns:   Left Arm Numbness Started last night. Localized to distal anterior bicep. The numbness comes and goes. No pins and needle sensation. Flexing and extending bicep seems to make it better. Sleeping with arm flexed makes it worse. She sometimes takes aspirin or tylenol for the discomfort which helps. No trauma/injury to her arm. No recent falls. No swelling to her arm.   She was seen in August for generalized pruritus. She was advised to start Zyrtec. Patient reports she never did try this. Also had CBC and CMP at the time which was only notable for hyperglycemia (known diabetic) and stable renal disease.   PERTINENT  PMH / PSH: T2DM, HLD, GERD,   OBJECTIVE:   BP 133/80   Pulse 83   Ht '5\' 4"'$  (1.626 m)   Wt 185 lb 2 oz (84 kg)   SpO2 99%   BMI 31.78 kg/m    General: NAD, pleasant, able to participate in exam Respiratory: normal effort Extremities: no upper extremity edema. No ecchymosis or abrasion to left upper arm.  Neuro: alert, answers questions appropriately, no focal deficits. Normal sensation to b/l upper extremities. Normal muscle strength b/l upper extremities  Psych: Normal affect and mood  ASSESSMENT/PLAN:   1. DM type 2 with diabetic background retinopathy ([place]) A1c at goal. Has been well controlled for a while now. Congratulated patient.  - Next HgB A1c in 6 months - Microalbumin/Creatinine Ratio, Urine - Continue Metformin 500 mg BID  - Continue pravastatin 40 mg daily  - Continue ARB   2. Left arm numbness Acute, intermittent. Localized. Without any radiating symptoms. Could be positional as she mentions it is worse when she lays on her arm. No exam abnormalities today. Will continue to monitor. Discussed return precautions.     Sharion Settler, [place]

## 2022-03-01 NOTE — Patient Instructions (Addendum)
It was wonderful to see you today.  Please bring ALL of your medications with you to every visit.   Today we talked about:  I suspect that the numbness you felt was positional. If it gets worse, starts to spread, or you develop weakness in your arm or hand please come back. We will just monitor for now.  Thank you for coming to your visit as scheduled. We have had a large "no-show" problem lately, and this significantly limits our ability to see and care for patients. As a friendly reminder- if you cannot make your appointment please call to cancel. We do have a no show policy for those who do not cancel within 24 hours. Our policy is that if you miss or fail to cancel an appointment within 24 hours, 3 times in a 28-monthperiod, you may be dismissed from our clinic.   Thank you for choosing [facility]   Please call [phone] any questions about today's appointment.  Please be sure to schedule follow up at the front  desk before you leave today.   ASharion Settler DO PGY-3 Family Medicine

## 2022-03-04 ENCOUNTER — Other Ambulatory Visit: Payer: Self-pay | Admitting: Family Medicine

## 2022-03-04 DIAGNOSIS — I10 Essential (primary) hypertension: Secondary | ICD-10-CM

## 2022-03-04 LAB — MICROALBUMIN / CREATININE URINE RATIO
Creatinine, Urine: 99.8 mg/dL
Microalb/Creat Ratio: 8 mg/g creat (ref 0–29)
Microalbumin, Urine: 7.6 ug/mL

## 2022-03-05 ENCOUNTER — Encounter: Payer: Self-pay | Admitting: Family Medicine

## 2022-03-07 ENCOUNTER — Other Ambulatory Visit: Payer: Self-pay

## 2022-03-07 DIAGNOSIS — R079 Chest pain, unspecified: Secondary | ICD-10-CM

## 2022-03-07 MED ORDER — ISOSORBIDE MONONITRATE ER 30 MG PO TB24: 30.0000 mg | ORAL_TABLET | Freq: Every day | ORAL | 3 refills | Status: DC

## 2022-03-12 ENCOUNTER — Other Ambulatory Visit: Payer: Self-pay | Admitting: *Deleted

## 2022-03-12 DIAGNOSIS — E113299 Type 2 diabetes mellitus with mild nonproliferative diabetic retinopathy without macular edema, unspecified eye: Secondary | ICD-10-CM

## 2022-03-12 MED ORDER — METFORMIN HCL 500 MG PO TABS: 500.0000 mg | ORAL_TABLET | Freq: Two times a day (BID) | ORAL | 3 refills | Status: DC

## 2022-03-18 ENCOUNTER — Telehealth: Payer: Self-pay

## 2022-03-18 ENCOUNTER — Ambulatory Visit: Payer: HMO | Admitting: Family Medicine

## 2022-03-18 NOTE — Telephone Encounter (Signed)
Patient calls nurse line reporting a headache and runny nose.   She reports symptoms started "over night." She reports she woke up this morning feeling "awful."   She denies any fevers, body aches, nausea, vomiting or diarrhea. She does report feeling "short winded." She was speaking in full sentences while on the phone.   Patient scheduled for this afternoon for evaluation. ED precautions discussed in the meantime for SOB.

## 2022-03-20 ENCOUNTER — Ambulatory Visit: Payer: HMO | Admitting: Family Medicine

## 2022-04-02 DIAGNOSIS — Z7984 Long term (current) use of oral hypoglycemic drugs: Secondary | ICD-10-CM | POA: Diagnosis not present

## 2022-04-02 DIAGNOSIS — H2513 Age-related nuclear cataract, bilateral: Secondary | ICD-10-CM | POA: Diagnosis not present

## 2022-04-02 DIAGNOSIS — E1136 Type 2 diabetes mellitus with diabetic cataract: Secondary | ICD-10-CM | POA: Diagnosis not present

## 2022-04-02 DIAGNOSIS — H34811 Central retinal vein occlusion, right eye, with macular edema: Secondary | ICD-10-CM | POA: Diagnosis not present

## 2022-04-02 LAB — HM DIABETES EYE EXAM

## 2022-04-17 ENCOUNTER — Ambulatory Visit (INDEPENDENT_AMBULATORY_CARE_PROVIDER_SITE_OTHER): Payer: HMO | Admitting: Family Medicine

## 2022-04-17 ENCOUNTER — Telehealth: Payer: Self-pay | Admitting: Family Medicine

## 2022-04-17 VITALS — BP 134/76 | HR 72 | Temp 98.0°F | Wt 188.6 lb

## 2022-04-17 DIAGNOSIS — R300 Dysuria: Secondary | ICD-10-CM | POA: Diagnosis not present

## 2022-04-17 DIAGNOSIS — Z23 Encounter for immunization: Secondary | ICD-10-CM | POA: Diagnosis not present

## 2022-04-17 DIAGNOSIS — H3500 Unspecified background retinopathy: Secondary | ICD-10-CM

## 2022-04-17 DIAGNOSIS — R1084 Generalized abdominal pain: Secondary | ICD-10-CM

## 2022-04-17 DIAGNOSIS — R6881 Early satiety: Secondary | ICD-10-CM

## 2022-04-17 DIAGNOSIS — R109 Unspecified abdominal pain: Secondary | ICD-10-CM

## 2022-04-17 DIAGNOSIS — R195 Other fecal abnormalities: Secondary | ICD-10-CM | POA: Insufficient documentation

## 2022-04-17 LAB — POCT URINALYSIS DIP (MANUAL ENTRY)
Bilirubin, UA: NEGATIVE
Blood, UA: NEGATIVE
Glucose, UA: NEGATIVE mg/dL
Ketones, POC UA: NEGATIVE mg/dL
Nitrite, UA: NEGATIVE
Protein Ur, POC: NEGATIVE mg/dL
Spec Grav, UA: 1.01 (ref 1.010–1.025)
Urobilinogen, UA: 0.2 E.U./dL
pH, UA: 6.5 (ref 5.0–8.0)

## 2022-04-17 MED ORDER — ZOSTER VAC RECOMB ADJUVANTED 50 MCG/0.5ML IM SUSR: 0.5000 mL | Freq: Once | INTRAMUSCULAR | 0 refills | Status: AC

## 2022-04-17 NOTE — Telephone Encounter (Signed)
Called patient to discuss urine results. No signs of infection, thus no abx at this time.   Given age, abdominal pain, early satiety, and pencil thin stools - want to rule out malignancy first and foremost. I have already referred her to GI urgently for evaluation.  After precepted with Dr. Owens Shark, she recommends we go ahead and get an abdominal scan.  I have ordered that in this encounter and will forward to RN team for scheduling.  No allergies to IV or oral contrast in chart Last creatinine on file from 09/05/2021 CMP, creatinine 1.02, at her baseline since 2021 BMP collected today for updated creatinine  Ezequiel Essex, MD

## 2022-04-17 NOTE — Progress Notes (Signed)
    SUBJECTIVE:   CHIEF COMPLAINT / HPI:   Lower abdominal and flank pain Since last Thursday (6 day duration) Some chills, nausea, abdominal bloating No fever, emesis, dysuria, weight loss, night sweats, hemoptysis  Eating normally Lots of heart burn - takes tums, good relief Early satiety since Thursday  No diarrhea  Some constipation - taking miralax  Soft stools currently about 2-3 times per week w/ miralax Pencil shaped stools x 2-3 weeks  Last colonoscopy "everything looked fine" cannot remember when it was  PERTINENT  PMH / PSH:  Patient Active Problem List   Diagnosis Date Noted   Retinopathy 04/18/2022   Need for shingles vaccine 04/18/2022   Abnormal stool caliber 04/17/2022   Early satiety 04/17/2022   Abdominal pain 11/20/2021   Paresthesia 08/18/2021   Left foot pain 04/10/2021   Screening for osteoporosis 04/10/2021   Nausea 07/26/2020   Labial lesion 05/24/2020   Ear pain, bilateral 02/25/2020   Constipation 01/11/2020   Chronic left shoulder pain 12/13/2019   Paresthesia of lip 12/13/2019   Rotator cuff injury 11/06/2019   Depression, major, single episode, mild (HCC) 08/20/2019   Sinus pressure 03/18/2019   Grief 03/18/2019   Chronic bilateral thoracic back pain 12/15/2018   Need for immunization against influenza 11/13/2018   Spinal stenosis of lumbar region 10/16/2018   Chronic bilateral low back pain without sciatica 07/30/2018   Chest congestion 03/13/2018   Chest pain 01/26/2018   Bradycardia 01/26/2018   Stress at home 11/19/2017   Lower extremity edema 09/30/2016   Swelling of lower extremity 09/16/2016   Right hip pain 03/19/2016   Microcytic anemia 10/05/2015   Meralgia paraesthetica 10/04/2015   Atypical lymphocytosis 09/21/2015   Left arm weakness 08/18/2015   Vertigo 12/27/2014   Greater trochanteric bursitis of right hip 07/06/2013   Muscle spasm 11/05/2012   Heart palpitations 05/01/2012   Chronic sinusitis 09/02/2011    Back pain 07/20/2009   RUQ pain 02/24/2009   GERD 06/05/2006   DM type 2 with diabetic background retinopathy ([place]) 04/03/2006   Hyperlipidemia associated with type 2 diabetes mellitus ([place]) 04/03/2006   OBESITY, NOS 04/03/2006   Iron deficiency anemia 04/03/2006   HYPERTENSION, BENIGN SYSTEMIC 04/03/2006   MITRAL VALVE PROLAPSE 04/03/2006   OSTEOARTHRITIS, MULTI SITES 04/03/2006   Proteinuria 04/03/2006    OBJECTIVE:   BP 134/76   Pulse 72   Temp 98 F (36.7 C) (Oral)   Wt 188 lb 9.6 oz (85.5 kg)   SpO2 96%   BMI 32.37 kg/m    Gen: awake, alert, NAD Resp: CTAB Card: RRR no murmur Abd: mod TTP over left lateral umbilicus and mild TTP over suprapubic area and inferior LLQ/RLQ MSK: left CVA tenderness, however also with left lumbar paraspinal TTP  ASSESSMENT/PLAN:   Abdominal pain Concern for malignancy with abdominal pain, early satiety, and pencil thin stools. Also considered GERD, UA/pyelo, and diverticulitis. Pyelo less likely given absence of fever and a normal UA. GERD possible but would not give early satiety and pencil thin stools. Diverticulitis possible, but no known history of diverticula and no blood per rectum. Will refer to GI and order CT A/P with oral and IV contrast.   Retinopathy H/o retinopathy on last ophtho report in 2020. Will refer to ophtho for ongoing care.   Need for shingles vaccine Provided with written script to obtain Shingles vaccine at her convenience.      Ezequiel Essex, MD [place]

## 2022-04-17 NOTE — Patient Instructions (Addendum)
It was wonderful to see you today. Thank you for allowing me to be a part of your care. Below is a short summary of what we discussed at your visit today:  Abdominal pain, early fullness I am concerned about two different things: a possible urine infection and something in your intestines.  We obtained a urine sample to rule out infection. I will call you with the results and talk about if you need antibiotics.   I am also sending you to a GI doctor to ask about possibly doing a colonoscopy or upper scope to see why you are having abdominal pain, early fullness with eating, and pencil thin stools. Someone from their office should be calling you in 1 to 2 weeks to schedule an appointment.  If you do not hear from them, let us know. We may need to nudge along the referral.    Please follow up with your primary care doctor or myself in 2-3 weeks to check in on the abdominal pain.   Retinopathy Your previous eye exam in 2020 showed retinopathy.  I have referred you back to ophthalmology for routine eye exam and monitoring of the retinopathy.  Someone from their office should be calling you in 1 to 2 weeks to schedule an appointment.  If you do not hear from them, let us know. We may need to nudge along the referral.    Health Maintenance We like to think about ways to keep you healthy for years to come. Below are some interventions and screenings we can offer to keep you healthy. Please return for a visit with your PCP to address many of these.  - Shingles vaccine - Flu vaccine - COVID booster  Shingles We have provided a printed script for your Shingles vaccine. When you are feeling well, please take this to any local pharmacy to obtain your Shingles vaccine. This is to help prevent the painful Shingles rash caused by Varicella - the chicken pox virus.   Medicare Annual Wellness Exam Your chart indicates that you are due for your Medicare annual wellness exam.  Your insurance likes [name] to do one  of these every year.  This is a nurse only visit that takes about 30 minutes to an hour.  It can be done either in person or virtually.  This is an in-depth visit that focuses on preventative care and keeping you healthy.  Somebody from our clinic will be calling you soon to get this scheduled.  Please bring all of your medications to every appointment!  If you have any questions or concerns, please do not hesitate to contact us via phone or MyChart message.   Ezequiel Essex, MD

## 2022-04-18 DIAGNOSIS — Z23 Encounter for immunization: Secondary | ICD-10-CM | POA: Insufficient documentation

## 2022-04-18 DIAGNOSIS — H3500 Unspecified background retinopathy: Secondary | ICD-10-CM | POA: Insufficient documentation

## 2022-04-18 LAB — BASIC METABOLIC PANEL
BUN/Creatinine Ratio: 15 (ref 12–28)
BUN: 15 mg/dL (ref 8–27)
CO2: 26 mmol/L (ref 20–29)
Calcium: 9.6 mg/dL (ref 8.7–10.3)
Chloride: 102 mmol/L (ref 96–106)
Creatinine, Ser: 1 mg/dL (ref 0.57–1.00)
Glucose: 83 mg/dL (ref 70–99)
Potassium: 3.7 mmol/L (ref 3.5–5.2)
Sodium: 143 mmol/L (ref 134–144)
eGFR: 57 mL/min/{1.73_m2} — ABNORMAL LOW (ref >59)

## 2022-04-18 LAB — CBC
Hematocrit: 34.9 % (ref 34.0–46.6)
Hemoglobin: 11.1 g/dL (ref 11.1–15.9)
MCH: 24.9 pg — ABNORMAL LOW (ref 26.6–33.0)
MCHC: 31.8 g/dL (ref 31.5–35.7)
MCV: 78 fL — ABNORMAL LOW (ref 79–97)
Platelets: 312 10*3/uL (ref 150–450)
RBC: 4.45 x10E6/uL (ref 3.77–5.28)
RDW: 13.4 % (ref 11.7–15.4)
WBC: 7.9 10*3/uL (ref 3.4–10.8)

## 2022-04-18 LAB — URINALYSIS, MICROSCOPIC ONLY
Bacteria, UA: NONE SEEN
Casts: NONE SEEN /lpf
Epithelial Cells (non renal): NONE SEEN /hpf (ref 0–10)
RBC, Urine: NONE SEEN /hpf (ref 0–2)
WBC, UA: NONE SEEN /hpf (ref 0–5)

## 2022-04-18 NOTE — Assessment & Plan Note (Signed)
H/o retinopathy on last ophtho report in 2020. Will refer to ophtho for ongoing care.

## 2022-04-18 NOTE — Assessment & Plan Note (Signed)
Concern for malignancy with abdominal pain, early satiety, and pencil thin stools. Also considered GERD, UA/pyelo, and diverticulitis. Pyelo less likely given absence of fever and a normal UA. GERD possible but would not give early satiety and pencil thin stools. Diverticulitis possible, but no known history of diverticula and no blood per rectum. Will refer to GI and order CT A/P with oral and IV contrast.

## 2022-04-18 NOTE — Assessment & Plan Note (Signed)
Provided with written script to obtain Shingles vaccine at her convenience.

## 2022-04-18 NOTE — Telephone Encounter (Signed)
Called [facility]. Scheduler states that they will call patient in order to schedule CT scan.   Talbot Grumbling, RN

## 2022-04-22 ENCOUNTER — Telehealth: Payer: Self-pay | Admitting: Family Medicine

## 2022-04-22 ENCOUNTER — Encounter: Payer: Self-pay | Admitting: Family Medicine

## 2022-04-22 NOTE — Telephone Encounter (Signed)
Called patient discussed normal lab results.  Patient possible identified with name and date of birth.  Labs: Normal CBC, BMP, UA.  Radiology: She has an appointment 4/11 for the scan and she knows where to go to get the prep several days prior  GI appointment: Saw that we had referred her to [facility] and they attempted to contact, but they were not able to get a hold of her and unable to leave voicemail.  I provided Ms. Danese with the phone number below and asked her to please call today to get an appointment.  [facility] Address: [address], [place], [place] [address] Phone: [phone]   All questions answered, patient grateful for call.   Ezequiel Essex, MD

## 2022-04-24 NOTE — Telephone Encounter (Signed)
Note opened in error.  Ezequiel Essex, MD

## 2022-05-09 NOTE — Progress Notes (Deleted)
    SUBJECTIVE:   CHIEF COMPLAINT / HPI:   Amber [name] is a 82 y.o. female who presents to the [facility] clinic today to discuss the following concerns:   Abdominal Pain Last seen in the office on 3/13 for abdominal pain, constipation, "pencil-thin" stools. Was referred to GI (has appt in May) and CT of abd/pelv ordered (scheduled 4/11). Last CBC with microcytosis without anemia though does have hx of iron deficiency anemia in the past. Patient presents today for follow up.   PERTINENT  PMH / PSH: GERD, T2DM  OBJECTIVE:   There were no vitals taken for this visit.   General: NAD, pleasant, able to participate in exam Cardiac: RRR, no murmurs. Respiratory: CTAB, normal effort, No wheezes, rales or rhonchi Abdomen: Bowel sounds present, nontender, nondistended, no hepatosplenomegaly. Extremities: no edema or cyanosis. Skin: warm and dry, no rashes noted Neuro: alert, no obvious focal deficits Psych: Normal affect and mood  ASSESSMENT/PLAN:   No problem-specific Assessment & Plan notes found for this encounter.     Sharion Settler, [place]

## 2022-05-10 ENCOUNTER — Ambulatory Visit: Payer: Self-pay | Admitting: Family Medicine

## 2022-05-13 NOTE — Progress Notes (Unsigned)
    SUBJECTIVE:   CHIEF COMPLAINT / HPI:   Amber [name] is a 82 y.o. female who presents to the [facility] clinic today to discuss the following concerns:   Abdominal Pain Last seen in the office on 3/13 for abdominal pain, constipation, "pencil-thin" stools. Was referred to GI (has appt in May) and CT of abd/pelv ordered (scheduled 4/11). Last CBC with microcytosis without anemia though does have hx of iron deficiency anemia in the past. Patient presents today for follow up.   She reports that her abdominal pain has improved for about the last week. She takes Tylenol about twice a day still, however. She reports she is also taking it because she developed a cough and some congestion. She has had normal stools.   Cough, nasal congestion Cough has been present for the last week. Has some central chest burning only when she coughs. She has some green productive cough. No SOB. No fevers. Also notes congestion, runny nose, and watery eyes. Eyes are also itchy and burning. No nausea or vomiting.   No sick contacts or recent travel. She lives alone, doesn't get out much.   PERTINENT  PMH / PSH: GERD, T2DM  OBJECTIVE:   BP 138/72   Pulse 76   Ht 5\' 4"  (1.626 m)   Wt 187 lb (84.8 kg)   SpO2 95%   BMI 32.10 kg/m    General: NAD, pleasant, able to participate in exam HEENT: Normocephalic, EOMI, conjunctival clear, nares patent, oropharynx  clear, s/p tonsillectomy Neck: Supple, no thyroid nodules  Cardiac: RRR, no murmurs. Respiratory: CTAB, normal effort, No wheezes, rales or rhonchi Abdomen: Bowel sounds present, soft, mild ttp of right flank without R/G, nondistended Skin: warm and dry Psych: Normal affect and mood  ASSESSMENT/PLAN:   1. URI with cough and congestion Symptoms of productive cough, congestion x1 week without fever or SOB. NAD, well-appearing today. SpO2 normal, lungs clear on examination, cardiac examination benign. No focal changes to suggest PNA. Defer imaging for  now, continue conservative care.  -Continue conservative care, see AVS for details -Return precautions discussed  2. Seasonal allergies Watery and itchy eyes, per patient. No signs of conjunctivitis on examination. Also with runny nose, could be allergic rhinitis as well. Treat symptomatically.  - Olopatadine HCl 0.2 % SOLN; Apply 1 drop to eye daily.  Dispense: 2.5 mL; Refill: 0 - fluticasone (FLONASE) 50 MCG/ACT nasal spray; Place 2 sprays into both nostrils daily.  Dispense: 16 g; Refill: 6  Abdominal pain appears resolved now but encouraged patient to keep previously scheduled CT and GI appointment.   Sabino Dick, DO [facility]

## 2022-05-14 ENCOUNTER — Encounter: Payer: Self-pay | Admitting: Family Medicine

## 2022-05-14 ENCOUNTER — Ambulatory Visit (INDEPENDENT_AMBULATORY_CARE_PROVIDER_SITE_OTHER): Payer: HMO | Admitting: Family Medicine

## 2022-05-14 VITALS — BP 138/72 | HR 76 | Ht 64.0 in | Wt 187.0 lb

## 2022-05-14 DIAGNOSIS — J302 Other seasonal allergic rhinitis: Secondary | ICD-10-CM

## 2022-05-14 DIAGNOSIS — J069 Acute upper respiratory infection, unspecified: Secondary | ICD-10-CM | POA: Diagnosis not present

## 2022-05-14 MED ORDER — FLUTICASONE PROPIONATE 50 MCG/ACT NA SUSP: 2.0000 | Freq: Every day | NASAL | 6 refills | Status: DC

## 2022-05-14 MED ORDER — OLOPATADINE HCL 0.2 % OP SOLN: 1.0000 [drp] | Freq: Every day | OPHTHALMIC | 0 refills | Status: AC

## 2022-05-14 NOTE — Patient Instructions (Addendum)
It was wonderful to see you today.  Please bring ALL of your medications with you to every visit.   Today we talked about:  I have sent in eye drops that may help with your itchy and watery eyes.  I have also sent in a prescription for Flonase that can help with your runny nose.  You may have a viral illness that is also causing your cough. You can continue Tylenol as needed for fever or pain.  You can use over the counter saline drops for congestion as needed. Vicks under your nose or on your chest can also help with congestion.   You can buy Mucinex as well for chest congestion.  Return for shortness of breath, chest pain, if your symptoms worsen and don't improve, or any other concerns.   Thank you for coming to your visit as scheduled. We have had a large "no-show" problem lately, and this significantly limits our ability to see and care for patients. As a friendly reminder- if you cannot make your appointment please call to cancel. We do have a no show policy for those who do not cancel within 24 hours. Our policy is that if you miss or fail to cancel an appointment within 24 hours, 3 times in a 67-month period, you may be dismissed from our clinic.   Thank you for choosing [facility].   Please call [phone] with any questions about today's appointment.  Please be sure to schedule follow up at the front  desk before you leave today.   Sabino Dick, DO PGY-3 Family Medicine

## 2022-05-14 NOTE — Progress Notes (Deleted)
    SUBJECTIVE:   CHIEF COMPLAINT / HPI:   Dorlis Pompa is a 82 y.o. female who presents to the [facility] clinic today to discuss the following concerns:   Abdominal Pain Last seen in the office on 3/13 for abdominal pain, constipation, "pencil-thin" stools. Was referred to GI (has appt 5/28) and CT of abd/pelv ordered (scheduled 4/11). Last CBC with microcytosis without anemia though does have hx of iron deficiency anemia in the past. Patient presents today for follow up.   PERTINENT  PMH / PSH: GERD, T2DM  OBJECTIVE:   There were no vitals taken for this visit.   General: NAD, pleasant, able to participate in exam Cardiac: RRR, no murmurs. Respiratory: CTAB, normal effort, No wheezes, rales or rhonchi Abdomen: Bowel sounds present, nontender, nondistended, no hepatosplenomegaly. Extremities: no edema or cyanosis. Skin: warm and dry, no rashes noted Neuro: alert, no obvious focal deficits Psych: Normal affect and mood  ASSESSMENT/PLAN:   No problem-specific Assessment & Plan notes found for this encounter.     Sydnee Levans, Medical Student [facility]

## 2022-05-16 ENCOUNTER — Other Ambulatory Visit: Payer: HMO

## 2022-06-03 ENCOUNTER — Telehealth: Payer: Self-pay | Admitting: Family Medicine

## 2022-06-03 NOTE — Telephone Encounter (Signed)
Called patient to schedule Medicare Annual Wellness Visit (AWV). Left message for patient to call back and schedule Medicare Annual Wellness Visit (AWV).  Last date of AWV:  AWVI eligible as of 02/04/2009  Please schedule an AWVI appointment at any time with [facility] ANNUAL WELLNESS VISIT.  If any questions, please contact me at [phone].    Thank you,  Seraya Jobst  Ambulatory Clinic Support [facility] Direct dial  [phone]   

## 2022-06-13 ENCOUNTER — Telehealth: Payer: Self-pay

## 2022-06-13 NOTE — Telephone Encounter (Signed)
Patient calls nurse line reporting stomach pains.   She reports she has been constipated for several days. She reports last BM was this morning, however her stool was very hard. She denies seeing any blood.   She reports she has been feeling weak and tired. She reports episodes of SHOB over the last few days. She was speaking in full sentences during our conversation. She reports feeling a "hard knot" in her stomach. Denies any distention. No nausea, vomiting, diarrhea or fevers.  She denies any chest pains, dizziness, headaches or vision changes.   Patient scheduled for tomorrow morning for evaluation.   ED precautions discussed with patient in the meantime.

## 2022-06-14 ENCOUNTER — Other Ambulatory Visit: Payer: Self-pay

## 2022-06-14 ENCOUNTER — Encounter: Payer: Self-pay | Admitting: Family Medicine

## 2022-06-14 ENCOUNTER — Ambulatory Visit (INDEPENDENT_AMBULATORY_CARE_PROVIDER_SITE_OTHER): Payer: PPO | Admitting: Family Medicine

## 2022-06-14 VITALS — BP 136/74 | HR 75 | Ht 64.0 in | Wt 184.8 lb

## 2022-06-14 DIAGNOSIS — C9110 Chronic lymphocytic leukemia of B-cell type not having achieved remission: Secondary | ICD-10-CM

## 2022-06-14 DIAGNOSIS — R109 Unspecified abdominal pain: Secondary | ICD-10-CM

## 2022-06-14 NOTE — Patient Instructions (Signed)
It was wonderful to see you today.  Please bring ALL of your medications with you to every visit.   Today we talked about:  You need to see Hematology again for your CLL--I have placed a referral, you will be called about this   For your abdominal pain, you can take Tylenol 650 mg three times a day   Thank you for choosing [facility].   Please call [phone] with any questions about today's appointment.  Please be sure to schedule follow up at the front  desk before you leave today.   Terisa Starr, MD  Family Medicine

## 2022-06-14 NOTE — Progress Notes (Signed)
    SUBJECTIVE:   CHIEF COMPLAINT: abdominal pain  HPI:   Amber [name] is a 82 y.o.  with history notable for type 2 DM, hysterectomy, and CLL presenting for ongoing abdominal pain. She reports this occurred in March and went away somewhat. It recurred the past few weeks. She feels a 'lump' epigastric area. Has associated nausea. No chest pain, not with exertion, not associated with food. Denies vomiting, melena. Reports constipation entirely resolved with Miralax. No new medications.No inciting foods. No dyspnea or palpitations. She has tried nothing for this.   PERTINENT  PMH / PSH/Family/Social History :  Family history of pancreatic cancer Personal history of colon surgery (?)  OBJECTIVE:   BP 136/74   Pulse 75   Ht 5\' 4"  (1.626 m)   Wt 184 lb 12.8 oz (83.8 kg)   SpO2 98%   BMI 31.72 kg/m   Today's weight:  Last Weight  Most recent update: 06/14/2022 11:16 AM    Weight  83.8 kg (184 lb 12.8 oz)            Review of prior weights: Filed Weights   06/14/22 1115  Weight: 184 lb 12.8 oz (83.8 kg)    Cardiac: Regular rate and rhythm. Normal S1/S2. No murmurs, rubs, or gallops appreciated. Lungs: Clear bilaterally to ascultation.  Abdomen: Normoactive bowel sounds. Reproducible pain in mid-epigstric area with palpable (? Spleen tip).   Psych: Pleasant and appropriate    ASSESSMENT/PLAN:   CLL (chronic lymphocytic leukemia) (HCC) Diagnosed 2017-2018 Was supposed to see Hematology every 6 months CBC and CMP today Could be related to possible splenomegaly on exam Referral to Hematology/Oncology placed     Abdominal Pain Differential includes gastritis, hiatal hernia, pancreatic lesion, splenomegaly given exam Also consider cardiac cause but less likely given reproducibility and symptoms  Continue Miralax Tylenol for Pain CT on Monday as ordered by Dr. Christianne Dolin Dr. Marina Goodell in May   Follow up with PCP    Terisa Starr, MD  Family Medicine Teaching  Service  [facility]

## 2022-06-14 NOTE — Assessment & Plan Note (Signed)
Diagnosed 2017-2018 Was supposed to see Hematology every 6 months CBC and CMP today Could be related to possible splenomegaly on exam Referral to Hematology/Oncology placed

## 2022-06-15 ENCOUNTER — Encounter: Payer: Self-pay | Admitting: Family Medicine

## 2022-06-15 LAB — CBC WITH DIFFERENTIAL/PLATELET
Basophils Absolute: 0 10*3/uL (ref 0.0–0.2)
Basos: 1 %
EOS (ABSOLUTE): 0.2 10*3/uL (ref 0.0–0.4)
Eos: 3 %
Hematocrit: 34.1 % (ref 34.0–46.6)
Hemoglobin: 11 g/dL — ABNORMAL LOW (ref 11.1–15.9)
Immature Grans (Abs): 0 10*3/uL (ref 0.0–0.1)
Immature Granulocytes: 0 %
Lymphocytes Absolute: 4.1 10*3/uL — ABNORMAL HIGH (ref 0.7–3.1)
Lymphs: 55 %
MCH: 25.2 pg — ABNORMAL LOW (ref 26.6–33.0)
MCHC: 32.3 g/dL (ref 31.5–35.7)
MCV: 78 fL — ABNORMAL LOW (ref 79–97)
Monocytes Absolute: 0.6 10*3/uL (ref 0.1–0.9)
Monocytes: 8 %
Neutrophils Absolute: 2.4 10*3/uL (ref 1.4–7.0)
Neutrophils: 33 %
Platelets: 305 10*3/uL (ref 150–450)
RBC: 4.36 x10E6/uL (ref 3.77–5.28)
RDW: 13.6 % (ref 11.7–15.4)
WBC: 7.4 10*3/uL (ref 3.4–10.8)

## 2022-06-15 LAB — COMPREHENSIVE METABOLIC PANEL
ALT: 13 IU/L (ref 0–32)
AST: 16 IU/L (ref 0–40)
Albumin/Globulin Ratio: 1.4 (ref 1.2–2.2)
Albumin: 4.3 g/dL (ref 3.7–4.7)
Alkaline Phosphatase: 63 IU/L (ref 44–121)
BUN/Creatinine Ratio: 17 (ref 12–28)
BUN: 18 mg/dL (ref 8–27)
Bilirubin Total: 0.4 mg/dL (ref 0.0–1.2)
CO2: 26 mmol/L (ref 20–29)
Calcium: 9.8 mg/dL (ref 8.7–10.3)
Chloride: 102 mmol/L (ref 96–106)
Creatinine, Ser: 1.05 mg/dL — ABNORMAL HIGH (ref 0.57–1.00)
Globulin, Total: 3.1 g/dL (ref 1.5–4.5)
Glucose: 92 mg/dL (ref 70–99)
Potassium: 4.2 mmol/L (ref 3.5–5.2)
Sodium: 143 mmol/L (ref 134–144)
Total Protein: 7.4 g/dL (ref 6.0–8.5)
eGFR: 53 mL/min/{1.73_m2} — ABNORMAL LOW (ref >59)

## 2022-06-17 ENCOUNTER — Ambulatory Visit
Admission: RE | Admit: 2022-06-17 | Discharge: 2022-06-17 | Disposition: A | Discharge status: Home / Self Care | Payer: PPO | Source: Ambulatory Visit | Attending: Family Medicine | Admitting: Family Medicine

## 2022-06-17 DIAGNOSIS — R195 Other fecal abnormalities: Secondary | ICD-10-CM

## 2022-06-17 DIAGNOSIS — R1084 Generalized abdominal pain: Secondary | ICD-10-CM

## 2022-06-17 DIAGNOSIS — R1031 Right lower quadrant pain: Secondary | ICD-10-CM | POA: Diagnosis not present

## 2022-06-17 DIAGNOSIS — R197 Diarrhea, unspecified: Secondary | ICD-10-CM | POA: Diagnosis not present

## 2022-06-17 DIAGNOSIS — R6881 Early satiety: Secondary | ICD-10-CM

## 2022-06-17 DIAGNOSIS — R110 Nausea: Secondary | ICD-10-CM | POA: Diagnosis not present

## 2022-06-17 DIAGNOSIS — R194 Change in bowel habit: Secondary | ICD-10-CM | POA: Diagnosis not present

## 2022-06-17 MED ORDER — IOPAMIDOL (ISOVUE-300) INJECTION 61%
100.0000 mL | Freq: Once | INTRAVENOUS | Status: AC | PRN
  Administered 2022-06-17: 100 mL via INTRAVENOUS

## 2022-06-19 ENCOUNTER — Encounter: Payer: Self-pay | Admitting: Family Medicine

## 2022-07-02 ENCOUNTER — Ambulatory Visit: Payer: PPO | Admitting: Internal Medicine

## 2022-07-12 ENCOUNTER — Ambulatory Visit (INDEPENDENT_AMBULATORY_CARE_PROVIDER_SITE_OTHER): Payer: PPO

## 2022-07-12 VITALS — Ht 64.0 in | Wt 184.0 lb

## 2022-07-12 DIAGNOSIS — Z0000 Encounter for general adult medical examination without abnormal findings: Secondary | ICD-10-CM | POA: Diagnosis not present

## 2022-07-12 NOTE — Progress Notes (Signed)
Subjective:   Amber [name] is a 82 y.o. female who presents for an Initial Medicare Annual Wellness Visit.  I connected with  Amber [name] on 07/12/22 by a audio enabled telemedicine application and verified that I am speaking with the correct person using two identifiers.  Patient Location: Home  Provider Location: Home Office  I discussed the limitations of evaluation and management by telemedicine. The patient expressed understanding and agreed to proceed.  Review of Systems     Cardiac Risk Factors include: advanced age (>12men, >6 women);diabetes mellitus;dyslipidemia;hypertension     Objective:    Today's Vitals   07/12/22 1050  Weight: 184 lb (83.5 kg)  Height: 5\' 4"  (1.626 m)   Body mass index is 31.58 kg/m.     07/12/2022   11:04 AM 06/14/2022   11:17 AM 05/14/2022    1:33 PM 04/17/2022   12:00 PM 03/01/2022    2:58 PM 09/05/2021    8:49 AM 08/16/2021    3:26 PM  Advanced Directives  Does Patient Have a Medical Advance Directive? No No No No No No No  Would patient like information on creating a medical advance directive? Yes (MAU/Ambulatory/Procedural Areas - Information given) No - Patient declined No - Patient declined No - Patient declined No - Patient declined No - Patient declined No - Patient declined    Current Medications (verified) Outpatient Encounter Medications as of 07/12/2022  Medication Sig   Accu-Chek Softclix Lancets lancets USE 1 STRIP TO CHECK GLUCOSE ONCE DAILY   acetaminophen (TYLENOL) 500 MG tablet Take 1 tablet (500 mg total) by mouth every 6 (six) hours as needed.   Blood Glucose Monitoring Suppl (TRUE METRIX METER) DEVI 1 Device by Does not apply route daily.   EQ ASPIRIN ADULT LOW DOSE 81 MG EC tablet Take 1 tablet by mouth once daily   fluticasone (FLONASE) 50 MCG/ACT nasal spray Place 2 sprays into both nostrils daily.   glucose blood test strip Use as instructed   isosorbide mononitrate (IMDUR) 30 MG 24 hr tablet Take 1  tablet (30 mg total) by mouth daily.   losartan-hydrochlorothiazide (HYZAAR) 100-12.5 MG tablet TAKE ONE TABLET BY MOUTH ONCE DAILY   metFORMIN (GLUCOPHAGE) 500 MG tablet Take 1 tablet (500 mg total) by mouth 2 (two) times daily with a meal.   Multiple Vitamins-Minerals (WOMENS ONE DAILY) TABS Take 1 tablet by mouth daily.   Olopatadine HCl 0.2 % SOLN Apply 1 drop to eye daily.   polyethylene glycol powder (GLYCOLAX/MIRALAX) 17 GM/SCOOP powder Take 17 g by mouth 2 (two) times daily as needed.   pravastatin (PRAVACHOL) 40 MG tablet Take 1 tablet (40 mg total) by mouth daily.   timolol (BETIMOL) 0.5 % ophthalmic solution Place 1 drop into the right eye 2 (two) times daily.   [DISCONTINUED] cetirizine (ZYRTEC ALLERGY) 10 MG tablet Take 1 tablet (10 mg total) by mouth daily. (Patient not taking: Reported on 05/14/2022)   [DISCONTINUED] diclofenac Sodium (VOLTAREN) 1 % GEL Apply 2 g topically 4 (four) times daily. (Patient not taking: Reported on 05/14/2022)   No facility-administered encounter medications on file as of 07/12/2022.    Allergies (verified) Amoxicillin, Sulfamethoxazole, Sulfonamide derivatives, Codeine, and Penicillins   History: Past Medical History:  Diagnosis Date   Anemia    Arthritis    Back pain    Blood transfusion without reported diagnosis    CLL (chronic lymphocytic leukemia) (HCC) 09/21/2015   Diabetes mellitus without complication (HCC)    Dizziness 12/27/2014  H/O echocardiogram    a. 2015: echo showing a preserved EF of 60-65%, Grade 1 DD and mild MR.   History of cardiac cath    a. normal cors by cath in 03/2010   Hyperlipidemia    Hypertension    Palpitations    Past Surgical History:  Procedure Laterality Date   ABDOMINAL HYSTERECTOMY     COLON SURGERY     Family History  Problem Relation Age of Onset   Pancreatic cancer Sister        3 sisters died of it   Heart attack Father 66       Massive MI   Diabetes Mother    Cancer Mother    Hypertension  Son    Congestive Heart Failure Son    Diabetes Son    Hypertension Daughter    Social History   Socioeconomic History   Marital status: Widowed    Spouse name: Not on file   Number of children: 5   Years of education: Not on file   Highest education level: Not on file  Occupational History   Not on file  Tobacco Use   Smoking status: Never   Smokeless tobacco: Former    Quit date: 02/05/1983  Vaping Use   Vaping Use: Never used  Substance and Sexual Activity   Alcohol use: No   Drug use: No   Sexual activity: Never  Other Topics Concern   Not on file  Social History Narrative   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  5 children, lives alone.   Pt exercises by walking every other day.         Social Determinants of Health   Financial Resource Strain: Low Risk  (07/12/2022)   Overall Financial Resource Strain (CARDIA)    Difficulty of Paying Living Expenses: Not hard at all  Food Insecurity: No Food Insecurity (07/12/2022)   Hunger Vital Sign    Worried About Running Out of Food in the Last Year: Never true    Ran Out of Food in the Last Year: Never true  Transportation Needs: No Transportation Needs (07/12/2022)   PRAPARE - Administrator, Civil Service (Medical): No    Lack of Transportation (Non-Medical): No  Physical Activity: Insufficiently Active (07/12/2022)   Exercise Vital Sign    Days of Exercise per Week: 4 days    Minutes of Exercise per Session: 30 min  Stress: No Stress Concern Present (07/12/2022)   [employer] of Occupational Health - Occupational Stress Questionnaire    Feeling of Stress : Not at all  Social Connections: Moderately Isolated (07/12/2022)   Social Connection and Isolation Panel [NHANES]    Frequency of Communication with Friends and Family: More than three times a week    Frequency of Social Gatherings with Friends and Family: Three times a week    Attends  Religious Services: More than 4 times per year    Active Member of Clubs or Organizations: No    Attends Banker Meetings: Never    Marital Status: Widowed    Tobacco Counseling Counseling given: Not Answered   Clinical Intake:  Pre-visit preparation completed: Yes  Pain : No/denies pain  Diabetes: Yes CBG done?: No Did pt. bring in CBG monitor from home?: No  How often do you need to have someone help you when you read instructions, pamphlets, or other written materials from your doctor  or pharmacy?: 1 - Never  Diabetic?Yes   Nutrition Risk Assessment:  Has the patient had any N/V/D within the last 2 months?  No  Does the patient have any non-healing wounds?  No  Has the patient had any unintentional weight loss or weight gain?  No   Diabetes:  Is the patient diabetic?  Yes  If diabetic, was a CBG obtained today?  No  Did the patient bring in their glucometer from home?  No  How often do you monitor your CBG's? daily.   Financial Strains and Diabetes Management:  Are you having any financial strains with the device, your supplies or your medication? No .  Does the patient want to be seen by Chronic Care Management for management of their diabetes?  No  Would the patient like to be referred to a Nutritionist or for Diabetic Management?  No   Diabetic Exams:  Diabetic Eye Exam: Completed records requested  Diabetic Foot Exam: Overdue, Pt has been advised about the importance in completing this exam. Pt is scheduled for diabetic foot exam on at next office visit .   Interpreter Needed?: No  Information entered by :: Kandis Fantasia LPN   Activities of Daily Living    07/12/2022   11:04 AM  In your present state of health, do you have any difficulty performing the following activities:  Hearing? 0  Vision? 0  Difficulty concentrating or making decisions? 0  Walking or climbing stairs? 0  Dressing or bathing? 0  Doing errands, shopping? 0   Preparing Food and eating ? N  Using the Toilet? N  In the past six months, have you accidently leaked urine? N  Do you have problems with loss of bowel control? N  Managing your Medications? N  Managing your Finances? N  Housekeeping or managing your Housekeeping? N    Patient Care Team: Sabino Dick, DO as PCP - General (Family Medicine) Eber Jones, MD as Referring Physician (Ophthalmology) Luane School, OD (Optometry)  Indicate any recent Medical Services you may have received from other than [facility] providers in the past year (date may be approximate).     Assessment:   This is a routine wellness examination for [place].  Hearing/Vision screen Hearing Screening - Comments:: Denies hearing difficulties   Vision Screening - Comments:: Wears rx glasses - up to date with routine eye exams with Dr. Leveda Anna and Dr. Sherryll Burger   Dietary issues and exercise activities discussed: Current Exercise Habits: Home exercise routine, Type of exercise: walking, Time (Minutes): 30, Frequency (Times/Week): 4, Weekly Exercise (Minutes/Week): 120, Intensity: Mild   Goals Addressed             This Visit's Progress    Remain active and independent         Depression Screen    07/12/2022   11:03 AM 06/14/2022   11:16 AM 05/14/2022    1:33 PM 04/17/2022   12:00 PM 03/01/2022    2:59 PM 09/05/2021    8:51 AM 09/05/2021    8:48 AM  PHQ 2/9 Scores  PHQ - 2 Score 0 0 0   0   PHQ- 9 Score 0 0 0   0   Exception Documentation    Patient refusal Patient refusal  Patient refusal    Fall Risk    07/12/2022   11:03 AM 06/14/2022   11:16 AM 05/14/2022    1:33 PM 03/01/2022    2:59 PM 09/05/2021    8:48 AM  Fall  Risk   Falls in the past year? 0 0 0 0 0  Number falls in past yr: 0 0 0 0 0  Injury with Fall? 0 0 0 0 0  Risk for fall due to : No Fall Risks      Follow up Falls prevention discussed;Education provided;Falls evaluation completed  Falls evaluation completed      FALL RISK PREVENTION  PERTAINING TO THE HOME:  Any stairs in or around the home? No  If so, are there any without handrails? No  Home free of loose throw rugs in walkways, pet beds, electrical cords, etc? Yes  Adequate lighting in your home to reduce risk of falls? Yes   ASSISTIVE DEVICES UTILIZED TO PREVENT FALLS:  Life alert? No  Use of a cane, walker or w/c? No  Grab bars in the bathroom? Yes  Shower chair or bench in shower? No  Elevated toilet seat or a handicapped toilet? Yes   TIMED UP AND GO:  Was the test performed? No . Telephonic visit   Cognitive Function:        07/12/2022   11:04 AM  6CIT Screen  What Year? 0 points  What month? 0 points  What time? 0 points  Count back from 20 0 points  Months in reverse 0 points  Repeat phrase 0 points  Total Score 0 points    Immunizations Immunization History  Administered Date(s) Administered   Fluad Quad(high Dose 65+) 12/02/2019, 03/14/2021   Influenza Split 12/21/2010, 12/11/2011   Influenza Whole 01/09/2007, 02/11/2008, 11/29/2008, 12/14/2009   Influenza,inj,Quad PF,6+ Mos 04/12/2013, 11/08/2013, 12/27/2014, 11/28/2015, 01/10/2017, 01/29/2018, 11/13/2018   PFIZER(Purple Top)SARS-COV-2 Vaccination 05/20/2019, 06/14/2019, 01/11/2020   Pneumococcal Conjugate-13 11/08/2013   Pneumococcal Polysaccharide-23 07/06/1999, 01/22/2007   Td 04/04/2001   Tdap 06/05/2011    TDAP status: Due, Education has been provided regarding the importance of this vaccine. Advised may receive this vaccine at local pharmacy or Health Dept. Aware to provide a copy of the vaccination record if obtained from local pharmacy or Health Dept. Verbalized acceptance and understanding.  Pneumococcal vaccine status: Up to date  Covid-19 vaccine status: Information provided on how to obtain vaccines.   Qualifies for Shingles Vaccine? Yes   Zostavax completed No   Shingrix Completed?: No.    Education has been provided regarding the importance of this vaccine. Patient  has been advised to call insurance company to determine out of pocket expense if they have not yet received this vaccine. Advised may also receive vaccine at local pharmacy or Health Dept. Verbalized acceptance and understanding.  Screening Tests Health Maintenance  Topic Date Due   Zoster Vaccines- Shingrix (1 of 2) Never done   OPHTHALMOLOGY EXAM  04/06/2019   FOOT EXAM  12/01/2020   DTaP/Tdap/Td (3 - Td or Tdap) 06/04/2021   COVID-19 Vaccine (4 - 2023-24 season) 10/05/2021   HEMOGLOBIN A1C  08/30/2022   INFLUENZA VACCINE  09/05/2022   Diabetic kidney evaluation - Urine ACR  03/02/2023   Diabetic kidney evaluation - eGFR measurement  06/14/2023   Medicare Annual Wellness (AWV)  07/12/2023   Pneumonia Vaccine 62+ Years old  Completed   DEXA SCAN  Completed   HPV VACCINES  Aged Out    Health Maintenance  Health Maintenance Due  Topic Date Due   Zoster Vaccines- Shingrix (1 of 2) Never done   OPHTHALMOLOGY EXAM  04/06/2019   FOOT EXAM  12/01/2020   DTaP/Tdap/Td (3 - Td or Tdap) 06/04/2021   COVID-19 Vaccine (4 -  2023-24 season) 10/05/2021    Colorectal cancer screening: No longer required.   Mammogram status: No longer required due to age and preference.  Bone Density status: Completed 02/11/06. Results reflect: Bone density results: NORMAL. Repeat every 5 years.  Lung Cancer Screening: (Low Dose CT Chest recommended if Age 37-80 years, 30 pack-year currently smoking OR have quit w/in 15years.) does not qualify.   Lung Cancer Screening Referral: n/a  Additional Screening:  Hepatitis C Screening: does not qualify  Vision Screening: Recommended annual ophthalmology exams for early detection of glaucoma and other disorders of the eye. Is the patient up to date with their annual eye exam?  Yes  Who is the provider or what is the name of the office in which the patient attends annual eye exams? Dr. Sherryll Burger If pt is not established with a provider, would they like to be referred  to a provider to establish care? No .   Dental Screening: Recommended annual dental exams for proper oral hygiene  Community Resource Referral / Chronic Care Management: CRR required this visit?  No   CCM required this visit?  No      Plan:     I have personally reviewed and noted the following in the patient's chart:   Medical and social history Use of alcohol, tobacco or illicit drugs  Current medications and supplements including opioid prescriptions. Patient is not currently taking opioid prescriptions. Functional ability and status Nutritional status Physical activity Advanced directives List of other physicians Hospitalizations, surgeries, and ER visits in previous 12 months Vitals Screenings to include cognitive, depression, and falls Referrals and appointments  In addition, I have reviewed and discussed with patient certain preventive protocols, quality metrics, and best practice recommendations. A written personalized care plan for preventive services as well as general preventive health recommendations were provided to patient.     Durwin Nora, [place]   02/09/1094   Due to this being a virtual visit, the after visit summary with patients personalized plan was offered to patient via mail or my-chart. per request, patient was mailed a copy of AVS.  Nurse Notes: No concerns

## 2022-07-12 NOTE — Patient Instructions (Addendum)
Amber [name] , Thank you for taking time to come for your Medicare Wellness Visit. I appreciate your ongoing commitment to your health goals. Please review the following plan we discussed and let me know if I can assist you in the future.   These are the goals we discussed:  Goals      Blood Pressure < 150/95     HEMOGLOBIN A1C < 7.0     Remain active and independent        This is a list of the screening recommended for you and due dates:  Health Maintenance  Topic Date Due   Zoster (Shingles) Vaccine (1 of 2) Never done   Eye exam for diabetics  04/06/2019   Complete foot exam   12/01/2020   DTaP/Tdap/Td vaccine (3 - Td or Tdap) 06/04/2021   COVID-19 Vaccine (4 - 2023-24 season) 10/05/2021   Hemoglobin A1C  08/30/2022   Flu Shot  09/05/2022   Yearly kidney health urinalysis for diabetes  03/02/2023   Yearly kidney function blood test for diabetes  06/14/2023   Medicare Annual Wellness Visit  07/12/2023   Pneumonia Vaccine  Completed   DEXA scan (bone density measurement)  Completed   HPV Vaccine  Aged Out    Advanced directives: Information on Advanced Care Planning can be found at [facility] Advance Health Care Directives Advance Health Care Directives ([email or web address]  Please bring a copy of your health care power of attorney and living will to the office to be added to your chart at your convenience.   Conditions/risks identified: Aim for 30 minutes of exercise or brisk walking, 6-8 glasses of water, and 5 servings of fruits and vegetables each day.   Next appointment: Follow up in one year for your annual wellness visit    Preventive Care 65 Years and Older, Female Preventive care refers to lifestyle choices and visits with your health care provider that can promote health and wellness. What does preventive care include? A yearly physical exam. This is also called an annual well check. Dental exams once or twice a year. Routine eye exams. Ask your  health care provider how often you should have your eyes checked. Personal lifestyle choices, including: Daily care of your teeth and gums. Regular physical activity. Eating a healthy diet. Avoiding tobacco and drug use. Limiting alcohol use. Practicing safe sex. Taking low-dose aspirin every day. Taking vitamin and mineral supplements as recommended by your health care provider. What happens during an annual well check? The services and screenings done by your health care provider during your annual well check will depend on your age, overall health, lifestyle risk factors, and family history of disease. Counseling  Your health care provider may ask you questions about your: Alcohol use. Tobacco use. Drug use. Emotional well-being. Home and relationship well-being. Sexual activity. Eating habits. History of falls. Memory and ability to understand (cognition). Work and work Astronomer. Reproductive health. Screening  You may have the following tests or measurements: Height, weight, and BMI. Blood pressure. Lipid and cholesterol levels. These may be checked every 5 years, or more frequently if you are over 22 years old. Skin check. Lung cancer screening. You may have this screening every year starting at age 52 if you have a 30-pack-year history of smoking and currently smoke or have quit within the past 15 years. Fecal occult blood test (FOBT) of the stool. You may have this test every year starting at age 18. Flexible sigmoidoscopy or colonoscopy. You  may have a sigmoidoscopy every 5 years or a colonoscopy every 10 years starting at age 70. Hepatitis C blood test. Hepatitis B blood test. Sexually transmitted disease (STD) testing. Diabetes screening. This is done by checking your blood sugar (glucose) after you have not eaten for a while (fasting). You may have this done every 1-3 years. Bone density scan. This is done to screen for osteoporosis. You may have this done  starting at age 68. Mammogram. This may be done every 1-2 years. Talk to your health care provider about how often you should have regular mammograms. Talk with your health care provider about your test results, treatment options, and if necessary, the need for more tests. Vaccines  Your health care provider may recommend certain vaccines, such as: Influenza vaccine. This is recommended every year. Tetanus, diphtheria, and acellular pertussis (Tdap, Td) vaccine. You may need a Td booster every 10 years. Zoster vaccine. You may need this after age 101. Pneumococcal 13-valent conjugate (PCV13) vaccine. One dose is recommended after age 56. Pneumococcal polysaccharide (PPSV23) vaccine. One dose is recommended after age 78. Talk to your health care provider about which screenings and vaccines you need and how often you need them. This information is not intended to replace advice given to you by your health care provider. Make sure you discuss any questions you have with your health care provider. Document Released: 02/17/2015 Document Revised: 10/11/2015 Document Reviewed: 11/22/2014 Elsevier Interactive Patient Education  2017 [place] Prevention in the Home Falls can cause injuries. They can happen to people of all ages. There are many things you can do to make your home safe and to help prevent falls. What can I do on the outside of my home? Regularly fix the edges of walkways and driveways and fix any cracks. Remove anything that might make you trip as you walk through a door, such as a raised step or threshold. Trim any bushes or trees on the path to your home. Use bright outdoor lighting. Clear any walking paths of anything that might make someone trip, such as rocks or tools. Regularly check to see if handrails are loose or broken. Make sure that both sides of any steps have handrails. Any raised decks and porches should have guardrails on the edges. Have any leaves, snow, or  ice cleared regularly. Use sand or salt on walking paths during winter. Clean up any spills in your garage right away. This includes oil or grease spills. What can I do in the bathroom? Use night lights. Install grab bars by the toilet and in the tub and shower. Do not use towel bars as grab bars. Use non-skid mats or decals in the tub or shower. If you need to sit down in the shower, use a plastic, non-slip stool. Keep the floor dry. Clean up any water that spills on the floor as soon as it happens. Remove soap buildup in the tub or shower regularly. Attach bath mats securely with double-sided non-slip rug tape. Do not have throw rugs and other things on the floor that can make you trip. What can I do in the bedroom? Use night lights. Make sure that you have a light by your bed that is easy to reach. Do not use any sheets or blankets that are too big for your bed. They should not hang down onto the floor. Have a firm chair that has side arms. You can use this for support while you get dressed. Do not have throw  rugs and other things on the floor that can make you trip. What can I do in the kitchen? Clean up any spills right away. Avoid walking on wet floors. Keep items that you use a lot in easy-to-reach places. If you need to reach something above you, use a strong step stool that has a grab bar. Keep electrical cords out of the way. Do not use floor polish or wax that makes floors slippery. If you must use wax, use non-skid floor wax. Do not have throw rugs and other things on the floor that can make you trip. What can I do with my stairs? Do not leave any items on the stairs. Make sure that there are handrails on both sides of the stairs and use them. Fix handrails that are broken or loose. Make sure that handrails are as long as the stairways. Check any carpeting to make sure that it is firmly attached to the stairs. Fix any carpet that is loose or worn. Avoid having throw rugs at  the top or bottom of the stairs. If you do have throw rugs, attach them to the floor with carpet tape. Make sure that you have a light switch at the top of the stairs and the bottom of the stairs. If you do not have them, ask someone to add them for you. What else can I do to help prevent falls? Wear shoes that: Do not have high heels. Have rubber bottoms. Are comfortable and fit you well. Are closed at the toe. Do not wear sandals. If you use a stepladder: Make sure that it is fully opened. Do not climb a closed stepladder. Make sure that both sides of the stepladder are locked into place. Ask someone to hold it for you, if possible. Clearly mark and make sure that you can see: Any grab bars or handrails. First and last steps. Where the edge of each step is. Use tools that help you move around (mobility aids) if they are needed. These include: Canes. Walkers. Scooters. Crutches. Turn on the lights when you go into a dark area. Replace any light bulbs as soon as they burn out. Set up your furniture so you have a clear path. Avoid moving your furniture around. If any of your floors are uneven, fix them. If there are any pets around you, be aware of where they are. Review your medicines with your doctor. Some medicines can make you feel dizzy. This can increase your chance of falling. Ask your doctor what other things that you can do to help prevent falls. This information is not intended to replace advice given to you by your health care provider. Make sure you discuss any questions you have with your health care provider. Document Released: 11/17/2008 Document Revised: 06/29/2015 Document Reviewed: 02/25/2014 Elsevier Interactive Patient Education  2017 [employer].

## 2022-07-16 NOTE — Progress Notes (Deleted)
    SUBJECTIVE:   CHIEF COMPLAINT / HPI:   Pamala Hayman is a 82 y.o. female who presents to the [facility] clinic today to discuss the following concerns:   ***  PERTINENT  PMH / PSH: ***  OBJECTIVE:   There were no vitals taken for this visit.   General: NAD, pleasant, able to participate in exam Cardiac: RRR, no murmurs. Respiratory: CTAB, normal effort, No wheezes, rales or rhonchi Abdomen: Bowel sounds present, nontender, nondistended, no hepatosplenomegaly. Extremities: no edema or cyanosis. Skin: warm and dry, no rashes noted Neuro: alert, no obvious focal deficits Psych: Normal affect and mood  ASSESSMENT/PLAN:   No problem-specific Assessment & Plan notes found for this encounter.     Sabino Dick, DO [facility]

## 2022-07-19 ENCOUNTER — Ambulatory Visit: Payer: BC Managed Care – PPO | Admitting: Family Medicine

## 2022-07-24 ENCOUNTER — Other Ambulatory Visit: Payer: Self-pay | Admitting: Family Medicine

## 2022-08-27 ENCOUNTER — Ambulatory Visit (INDEPENDENT_AMBULATORY_CARE_PROVIDER_SITE_OTHER): Payer: PPO | Admitting: Student

## 2022-08-27 VITALS — BP 129/69 | HR 81 | Ht 62.0 in | Wt 185.2 lb

## 2022-08-27 DIAGNOSIS — R110 Nausea: Secondary | ICD-10-CM | POA: Diagnosis not present

## 2022-08-27 DIAGNOSIS — D72820 Lymphocytosis (symptomatic): Secondary | ICD-10-CM | POA: Diagnosis not present

## 2022-08-27 LAB — POC SOFIA SARS ANTIGEN FIA: SARS Coronavirus 2 Ag: NEGATIVE

## 2022-08-27 NOTE — Patient Instructions (Addendum)
It was great to see you today! Thank you for choosing [facility] for your primary care. Amber [name] was seen for follow up.  Today we addressed: Please call the hematologist to discuss your blood  [facility] Address: [address] [place], [place], [place] [address] Phone: [phone] 2. I will call with COVID results  3. Stay hydrated!! Please drink water routinely and eat as able  4. Return in 2 weeks   If you haven't already, sign up for My Chart to have easy access to your labs results, and communication with your primary care physician.  I recommend that you always bring your medications to each appointment as this makes it easy to ensure you are on the correct medications and helps [name] not miss refills when you need them. Call the clinic at [phone] if your symptoms worsen or you have any concerns.  You should return to our clinic Return in about 2 weeks (around 09/10/2022). Please arrive 15 minutes before your appointment to ensure smooth check in process.  We appreciate your efforts in making this happen.  Thank you for allowing me to participate in your care, Alfredo Martinez, MD 08/27/2022, 4:34 PM PGY-3, [facility]

## 2022-08-27 NOTE — Progress Notes (Unsigned)
  SUBJECTIVE:   CHIEF COMPLAINT / HPI:   Dizziness  Stomachache  Getting cold and hot during the day  Intermittent dizziness over the last 2 weeks  No syncope or head trauma  Was around one sick contact but not for long No vomiting, but yes to nausea  No dyspnea or chest pain  No heart palpitations  Worsening dizziness when getting up really quickly  Cannot elicit symptoms with quick head movements   Documented history of CLL per last note, has yet to see Heme for this.   PERTINENT  PMH / PSH:  CLL HTN GERD DM2  Patient Care Team: Ivery Quale, MD as PCP - General Eber Jones, MD as Referring Physician (Ophthalmology) Luane School, OD (Optometry) OBJECTIVE:  BP 129/69   Pulse 81   Ht 5\' 2"  (1.575 m)   Wt 185 lb 3.2 oz (84 kg)   SpO2 97%   BMI 33.87 kg/m  Physical Exam  General: Alert and oriented in no apparent distress Heart: Regular rate and rhythm with no murmurs appreciated Lungs: CTA bilaterally, no wheezing Abdomen: Bowel sounds present, no abdominal pain Skin: Warm and dry Extremities: No lower extremity edema   ASSESSMENT/PLAN:  Nausea Assessment & Plan: In addition to dizziness with reassuring physical examination and negative orthostatics.  I am concerned however, given patient's age and history.  It does not seem that she has any cardiac etiology to cause the symptoms.  Given her presentation, stated she is dehydrated and may have a transient illness.  However, I would like her to follow-up with hematology given her history of CLL.  She does have some nausea but no vomiting and is able to keep down appropriate amounts of food and hydrate.  Discussed fall precautions as well as the imperative nature of her follow-up.  Ordered COVID testing as well.  I would like her to follow-up in 2 weeks if symptoms persist for further assessment.  Lab was closed so unable to obtain hemoglobin today, would recommend this at 2-week follow-up if continues to be symptomatic.  Of note, no known hypoglycemic episodes as well.   Orders: -     POC SOFIA Antigen FIA  Atypical lymphocytosis Assessment & Plan: Has been lost to follow up on multiple occasions with heme, provided information and expressed that it is imperative she follow up with them.     Return in about 2 weeks (around 09/10/2022). Alfredo Martinez, MD 08/28/2022, 10:45 AM PGY-3, [facility]

## 2022-08-28 ENCOUNTER — Encounter: Payer: Self-pay | Admitting: Student

## 2022-08-28 NOTE — Assessment & Plan Note (Signed)
Has been lost to follow up on multiple occasions with heme, provided information and expressed that it is imperative she follow up with them.

## 2022-08-28 NOTE — Assessment & Plan Note (Signed)
In addition to dizziness with reassuring physical examination and negative orthostatics.  I am concerned however, given patient's age and history.  It does not seem that she has any cardiac etiology to cause the symptoms.  Given her presentation, stated she is dehydrated and may have a transient illness.  However, I would like her to follow-up with hematology given her history of CLL.  She does have some nausea but no vomiting and is able to keep down appropriate amounts of food and hydrate.  Discussed fall precautions as well as the imperative nature of her follow-up.  Ordered COVID testing as well.  I would like her to follow-up in 2 weeks if symptoms persist for further assessment.  Lab was closed so unable to obtain hemoglobin today, would recommend this at 2-week follow-up if continues to be symptomatic. Of note, no known hypoglycemic episodes as well.

## 2022-09-06 DIAGNOSIS — H548 Legal blindness, as defined in USA: Secondary | ICD-10-CM | POA: Diagnosis not present

## 2022-09-06 DIAGNOSIS — H524 Presbyopia: Secondary | ICD-10-CM | POA: Diagnosis not present

## 2022-09-06 DIAGNOSIS — H40053 Ocular hypertension, bilateral: Secondary | ICD-10-CM | POA: Diagnosis not present

## 2022-09-06 DIAGNOSIS — H5202 Hypermetropia, left eye: Secondary | ICD-10-CM | POA: Diagnosis not present

## 2022-09-06 DIAGNOSIS — H52202 Unspecified astigmatism, left eye: Secondary | ICD-10-CM | POA: Diagnosis not present

## 2022-09-09 NOTE — Progress Notes (Deleted)
    SUBJECTIVE:   CHIEF COMPLAINT / HPI:   Dizziness  Nausea  PERTINENT  PMH / PSH: ***  OBJECTIVE:   There were no vitals taken for this visit.  ***  ASSESSMENT/PLAN:   No problem-specific Assessment & Plan notes found for this encounter.   Heme follow up?   Ivery Quale, MD [facility] [place]

## 2022-09-10 ENCOUNTER — Ambulatory Visit: Payer: Self-pay | Admitting: Family Medicine

## 2022-09-16 ENCOUNTER — Ambulatory Visit (INDEPENDENT_AMBULATORY_CARE_PROVIDER_SITE_OTHER): Payer: PPO | Admitting: Family Medicine

## 2022-09-16 ENCOUNTER — Encounter: Payer: Self-pay | Admitting: Family Medicine

## 2022-09-16 VITALS — BP 144/73 | HR 76 | Ht 62.0 in | Wt 184.4 lb

## 2022-09-16 DIAGNOSIS — K219 Gastro-esophageal reflux disease without esophagitis: Secondary | ICD-10-CM

## 2022-09-16 DIAGNOSIS — E113299 Type 2 diabetes mellitus with mild nonproliferative diabetic retinopathy without macular edema, unspecified eye: Secondary | ICD-10-CM | POA: Diagnosis not present

## 2022-09-16 DIAGNOSIS — R1013 Epigastric pain: Secondary | ICD-10-CM | POA: Diagnosis not present

## 2022-09-16 LAB — POCT GLYCOSYLATED HEMOGLOBIN (HGB A1C): HbA1c, POC (controlled diabetic range): 5.9 % (ref 0.0–7.0)

## 2022-09-16 MED ORDER — OMEPRAZOLE 20 MG PO CPDR: 20.0000 mg | DELAYED_RELEASE_CAPSULE | Freq: Every day | ORAL | 3 refills | Status: DC

## 2022-09-16 NOTE — Progress Notes (Cosign Needed Addendum)
    SUBJECTIVE:   CHIEF COMPLAINT / HPI:   GERD Patient has been experiencing reflux symptoms about 2-3 times a week. She feels the pain in the substernal epigastric region, with episodes lasting around 30 minutes. Pain is alleviated somewhat from TUMS, worsened by spicy foods. No recent vomiting. Patient was formerly taking prilosec over a year ago, but was unable to continue due to cost. She found the prilosec to be helpful with her symptoms.   Nausea At last clinic visit, patient was experiencing nausea. This has resolved on its own. No lightheadedness, no dizziness.   PERTINENT  PMH / PSH: GERD, CLL, HTN, DM2  OBJECTIVE:   BP (!) 144/73   Pulse 76   Ht 5\' 2"  (1.575 m)   Wt 184 lb 6 oz (83.6 kg)   SpO2 99%   BMI 33.72 kg/m   General: Well-appearing, no acute distress. CV: Normal S1/S2. No extra heart sounds. Warm and well-perfused. Pulm: Breathing comfortably on room air. CTAB. No increased WOB.  Abd: Some tenderness to palpation of epigastric region. Soft, non-distended overall. No rebound or guarding. Skin: Warm, dry. Psych: Pleasant and appropriate.  ASSESSMENT/PLAN:   GERD - Restart Prilosec. Patient to contact us if there are issues with insurance coverage. - Red flag symptoms included in AVS  Health Maintenance - HgbA1c 5.9 today - cont metformin as prescribed  - BP within goal range today - cont BP meds as prescribed  Patient to follow up in clinic in 1 month. Patient also given contact information for [facility] to make follow up appointment with her hematologist.    Ivery Quale, MD [facility]

## 2022-09-16 NOTE — Assessment & Plan Note (Deleted)
-   HgbA1c 5.9 today - Cont metformin as prescribed

## 2022-09-16 NOTE — Patient Instructions (Addendum)
Thank you for visiting clinic today - it was great to see you!  Today we discussed the reflux symptoms you've been having. We have sent a prescription for Prilosec to your pharmacy. Please take once a day to help with your symptoms. Reasons to return to clinic or seek more immediate care: uncontrollable vomiting, vomiting blood or coffee grounds, or having dark, sticky stools.   Your HgbA1c was reassuring today. Please continue to take your diabetes medications as per usual.   Please schedule a clinic appointment in 1 month to follow up on your symptoms.   Reach out any time with questions or concerns - we are here for you.  Ivery Quale, MD

## 2022-09-16 NOTE — Assessment & Plan Note (Signed)
-   Restart Prilosec. Patient to contact us if there are issues with insurance coverage. - Red flag symptoms included in AVS

## 2022-09-24 ENCOUNTER — Ambulatory Visit: Payer: BC Managed Care – PPO | Admitting: Internal Medicine

## 2022-10-18 ENCOUNTER — Ambulatory Visit (INDEPENDENT_AMBULATORY_CARE_PROVIDER_SITE_OTHER): Payer: PPO | Admitting: Family Medicine

## 2022-10-18 VITALS — BP 150/73 | HR 70 | Temp 98.1°F | Ht 62.0 in | Wt 180.0 lb

## 2022-10-18 DIAGNOSIS — R0981 Nasal congestion: Secondary | ICD-10-CM

## 2022-10-18 MED ORDER — LORATADINE 10 MG PO TABS
10.0000 mg | ORAL_TABLET | Freq: Every day | ORAL | 11 refills | Status: AC
Start: 2022-10-18 — End: ?

## 2022-10-18 MED ORDER — OXYMETAZOLINE HCL 0.05 % NA SOLN
1.0000 | Freq: Two times a day (BID) | NASAL | 0 refills | Status: AC
Start: 2022-10-18 — End: ?

## 2022-10-18 NOTE — Patient Instructions (Addendum)
It was great to see you! Thank you for allowing me to participate in your care!  Our plans for today:  -I believe you have seasonal allergies are flared up.  Please continue taking your Flonase daily, please start taking Claritin allergy medication once daily. -You may also take Afrin for up to 3 days to help with your congestion.   Please arrive 15 minutes PRIOR to your next scheduled appointment time! If you do not, this affects OTHER patients' care.  Take care and seek immediate care sooner if you develop any concerns.   Celine Mans, MD, PGY-2 [facility] 10:40 AM 10/18/2022  [facility]

## 2022-10-18 NOTE — Progress Notes (Signed)
    SUBJECTIVE:   CHIEF COMPLAINT / HPI: head congestion  -4 days of congestion -Not getting better -Mostly in nose -No cough -Having to breath out of mouth -No fevers or head ache -No nausea, vomiting, diarrhea -No sore throat -Tylenol and Nasal spray (Flonase) not helping -No known sick contacts -Whitish congestion -States eyes have been red for a while and goes to eye doctor -had negative covid test -no eye itching -no ear pain or drainage -appetite decreased -drinking lots of fluids  PERTINENT  PMH / PSH: Hypertension, type 2 diabetes, HLD, GERD  OBJECTIVE:   BP (!) 150/73   Pulse 70   Temp 98.1 F (36.7 C)   Ht 5\' 2"  (1.575 m)   Wt 180 lb (81.6 kg)   SpO2 96%   BMI 32.92 kg/m   General: NAD  Neuro: A&O Cardiovascular: RRR, no murmurs, no peripheral edema Respiratory: normal WOB on RA, CTAB, no wheezes, ronchi or rales Extremities: Moving all 4 extremities equally HEENT: Pupils equal round and reactive, moist mucous membranes, no posterior oropharyngeal erythema, no signs of tonsillar adenitis or exudates, bilateral nasal turbinates blue tinged, noninflamed, no rhinorrhea, external auditory canals normal, bilateral TMs visible, no erythema, bilateral clear cone of light   ASSESSMENT/PLAN:   Assessment & Plan Nasal congestion Suspect patient has flare of allergic rhinitis versus viral sinusitis.  As she does not have other symptoms of viral upper respiratory infection, will treat as allergic rhinitis.  Discussed addition of Claritin to once daily Flonase.  Also instructed to take Afrin for up to 3 days to help with her congestion.  If not improving over the next week please return to care.  Discussed plan with patient who is agreeable. Return if symptoms worsen or fail to improve.  Celine Mans, MD [facility]

## 2022-10-25 ENCOUNTER — Ambulatory Visit: Payer: Self-pay | Admitting: Family Medicine

## 2022-10-25 NOTE — Progress Notes (Deleted)
    SUBJECTIVE:   CHIEF COMPLAINT / HPI:   Follow up epigastric pain  PERTINENT  PMH / PSH: ***  OBJECTIVE:   There were no vitals taken for this visit.  ***  ASSESSMENT/PLAN:   No problem-specific Assessment & Plan notes found for this encounter.     Ivery Quale, MD [facility]

## 2023-01-06 ENCOUNTER — Encounter: Payer: Self-pay | Admitting: Family Medicine

## 2023-01-06 ENCOUNTER — Ambulatory Visit (INDEPENDENT_AMBULATORY_CARE_PROVIDER_SITE_OTHER): Payer: PPO | Admitting: Family Medicine

## 2023-01-06 VITALS — BP 161/70 | HR 70 | Ht 62.0 in | Wt 179.6 lb

## 2023-01-06 DIAGNOSIS — I10 Essential (primary) hypertension: Secondary | ICD-10-CM

## 2023-01-06 DIAGNOSIS — E785 Hyperlipidemia, unspecified: Secondary | ICD-10-CM | POA: Diagnosis not present

## 2023-01-06 DIAGNOSIS — E1169 Type 2 diabetes mellitus with other specified complication: Secondary | ICD-10-CM | POA: Diagnosis not present

## 2023-01-06 DIAGNOSIS — D509 Iron deficiency anemia, unspecified: Secondary | ICD-10-CM | POA: Diagnosis not present

## 2023-01-06 DIAGNOSIS — E113299 Type 2 diabetes mellitus with mild nonproliferative diabetic retinopathy without macular edema, unspecified eye: Secondary | ICD-10-CM

## 2023-01-06 LAB — POCT GLYCOSYLATED HEMOGLOBIN (HGB A1C): HbA1c, POC (controlled diabetic range): 5.7 % (ref 0.0–7.0)

## 2023-01-06 MED ORDER — LOSARTAN POTASSIUM-HCTZ 100-25 MG PO TABS: 1.0000 | ORAL_TABLET | Freq: Every day | ORAL | 2 refills | Status: DC

## 2023-01-06 NOTE — Progress Notes (Signed)
    SUBJECTIVE:   CHIEF COMPLAINT / HPI:   T2DM Patient presenting to follow up on A1c. Last A1c of 5.9 in October. Home regimen includes metformin 500 BID. Checks BG daily in the mornings before eating, usually in the 90s. No recent high BG values. Eats a balanced diet with active lifestyle.   HTN Current regimen includes Hyzaar 100-12.5 daily, and Imdur daily (though prescribed for chest pain). Patient has not had any recent headaches, vision changes, chest pain, difficulty breathing, or extremity swelling. She follows with an eye doctor for her eye health.  Hx IDA Patient notes a history of iron deficiency anemia. She was previously taking iron supplements but stopped over a year ago due to constipation. She has no recent bleeding symptoms - no hematuria, hemoptysis, bloody stools/melena, or new bruising. She does take a daily multivitamin that may have iron in it.   PERTINENT  PMH / PSH: T2DM, HTN, HLD, IDA  OBJECTIVE:   BP (!) 169/89   Pulse 70   Ht 5\' 2"  (1.575 m)   Wt 179 lb 9.6 oz (81.5 kg)   SpO2 99%   BMI 32.85 kg/m   General: Well-appearing. Resting comfortably in room. CV: Normal S1/S2. No extra heart sounds. Warm and well-perfused. Normal radial and distal pulses bilaterally. Pulm: Breathing comfortably on room air. CTAB. No increased WOB. Abd: Soft, non-tender, non-distended. Ext: No LE edema. Nontender to palpation.  Skin:  Warm, dry. Psych: Pleasant and appropriate.    ASSESSMENT/PLAN:   T2DM A1c 5.6 in clinic today. Last lipid panel with LDL 103 about 1 year ago.  - Discussed stopping metformin at this time with A1c follow up in 4-6 months - Lipid panel ordered   HTN BP elevated in clinic today x 2. 169/89 > 161/70.  - Increase Hyzaar to 100-25 mg  - Fu BP in 1 month   Hx IDA No active bleeding at this time. Stopped taking iron over a year ago. Last Hgb borderline at 11.0 in May 2024.  - CBC ordered - Iron, TIBC, Ferritin ordered  Return to clinic  in 1 month for BP fu. Return to clinic in 4-6 mo for diabetes fu.   Amber Quale, MD [facility]

## 2023-01-06 NOTE — Patient Instructions (Addendum)
Thank you for visiting clinic today - it is always our pleasure to care for you.  Today we discussed your diabetes and blood pressure. - Your A1c today was great. Let's try to stop taking metformin and see how you do. Hopefully you will have one less medication to take! - Your blood pressure today has been high so we increased your Hyzaar medication dose.  - We collected some labs today to check in on your cholesterol and history of anemia. I will contact you with the results.   Please schedule an appointment in 1 month to follow up on your blood pressure. Please schedule an appointment in 4-6 months to follow up on your diabetes.    Reach out any time with any questions or concerns you may have - we are here for you!  Amber Quale, MD [facility] [phone]

## 2023-01-07 LAB — CBC
Hematocrit: 34.8 % (ref 34.0–46.6)
Hemoglobin: 10.9 g/dL — ABNORMAL LOW (ref 11.1–15.9)
MCH: 25.3 pg — ABNORMAL LOW (ref 26.6–33.0)
MCHC: 31.3 g/dL — ABNORMAL LOW (ref 31.5–35.7)
MCV: 81 fL (ref 79–97)
Platelets: 297 10*3/uL (ref 150–450)
RBC: 4.3 x10E6/uL (ref 3.77–5.28)
RDW: 13.5 % (ref 11.7–15.4)
WBC: 8.8 10*3/uL (ref 3.4–10.8)

## 2023-01-07 LAB — IRON,TIBC AND FERRITIN PANEL
Ferritin: 54 ng/mL (ref 15–150)
Iron Saturation: 18 % (ref 15–55)
Iron: 52 ug/dL (ref 27–139)
Total Iron Binding Capacity: 292 ug/dL (ref 250–450)
UIBC: 240 ug/dL (ref 118–369)

## 2023-01-07 LAB — LIPID PANEL
Chol/HDL Ratio: 3.9 {ratio} (ref 0.0–4.4)
Cholesterol, Total: 187 mg/dL (ref 100–199)
HDL: 48 mg/dL (ref >39)
LDL Chol Calc (NIH): 113 mg/dL — ABNORMAL HIGH (ref 0–99)
Triglycerides: 147 mg/dL (ref 0–149)
VLDL Cholesterol Cal: 26 mg/dL (ref 5–40)

## 2023-01-08 ENCOUNTER — Telehealth: Payer: Self-pay | Admitting: Family Medicine

## 2023-01-08 DIAGNOSIS — D649 Anemia, unspecified: Secondary | ICD-10-CM

## 2023-01-08 NOTE — Telephone Encounter (Signed)
Spoke with patient regarding recent clinic visit lab results. Discussed normal iron panel with mildly decreased Hemoglobin. Discussed plans for colonoscopy given no obvious signs of bleeding. Discussed elevated LDL and continuing course of pravastatin as prescribed. Patient expressed understanding.

## 2023-01-20 ENCOUNTER — Telehealth: Payer: Self-pay

## 2023-01-20 NOTE — Telephone Encounter (Signed)
Attempted to return patient call regarding home blood sugars. Unfortunately unable to reach patient and was not given an option to leave a voicemail. Will try to call again tomorrow.

## 2023-01-20 NOTE — Telephone Encounter (Signed)
Patient calls nurse line requesting to speak with PCP.   She reports since starting Losartan-Potassium 100-25mg  her sugars have been elevated.   She reports fasting cbgs have been ~130s and after she eats 150s-160s. She denies any symptoms of hyperglycemia. She reports checking her sugars 2x per day.   She reports she was told to call and inform PCP if new medication elevated her blood sugars.   Will forward to PCP.   Precautions discussed.

## 2023-01-21 ENCOUNTER — Telehealth: Payer: Self-pay | Admitting: Family Medicine

## 2023-01-21 NOTE — Telephone Encounter (Signed)
Called and spoke with patient regarding most recent patient call inquiring about blood pressure medication and blood sugars.  Discussed that her blood pressure medication (losartan) should not have an effect on her blood sugars.  Patient expressed understanding and would like to continue the blood pressure medication.  Discussed how metformin was stopped at patient's last clinic visit due to longstanding stable A1c.  Will recheck A1c in a couple months to reassess. Patient reports no recent blood sugars above 200, with fasting BG in the 120s and nonfasting in the mid 100s.  Discussed reasonable fasting and nonfasting blood sugar levels at home.  Patient would like to continue holding off on metformin at this time.  Patient expressed understanding throughout conversation and questions were addressed.

## 2023-02-14 ENCOUNTER — Ambulatory Visit: Payer: Self-pay | Admitting: Family Medicine

## 2023-02-27 ENCOUNTER — Ambulatory Visit (INDEPENDENT_AMBULATORY_CARE_PROVIDER_SITE_OTHER): Payer: PPO | Admitting: Family Medicine

## 2023-02-27 ENCOUNTER — Encounter: Payer: Self-pay | Admitting: Family Medicine

## 2023-02-27 VITALS — BP 140/63 | HR 72 | Ht 62.0 in | Wt 179.2 lb

## 2023-02-27 DIAGNOSIS — I10 Essential (primary) hypertension: Secondary | ICD-10-CM

## 2023-02-27 NOTE — Patient Instructions (Signed)
Thank you for visiting clinic today - it is always our pleasure to care for you.  Your blood pressure was in a pretty good spot today! Please continue taking your medication as prescribed (Hyzaar 100-25). We are also checking a lab for you today and I will let you know of the results.   Please schedule an appointment in 2 months to check in on your diabetes care.   Reach out any time with any questions or concerns you may have - we are here for you!  Ivery Quale, MD [facility] [phone]

## 2023-02-27 NOTE — Progress Notes (Signed)
    SUBJECTIVE:   CHIEF COMPLAINT / HPI:   HTN Patient presenting today to follow-up on blood pressure.  At her last visit in December, we increased her Hyzaar to 100-25 for continued elevated blood pressures.  Since this change in medication, patient reports doing well.  No dizziness or lightheadedness.  No chest pain or difficulty breathing.  No headache or new vision changes.  No extremity swelling. Does also take Imdur 30 daily which was previously prescribed for chest pain symptoms.  PERTINENT  PMH / PSH: HTN, T2DM, HLD  OBJECTIVE:   BP (!) 153/63   Pulse 72   Ht 5\' 2"  (1.575 m)   Wt 179 lb 3.2 oz (81.3 kg)   SpO2 100%   BMI 32.78 kg/m   General: Well-appearing. Resting comfortably in room. CV: Normal S1/S2. No extra heart sounds. Warm and well-perfused. Pulm: Breathing comfortably on room air. CTAB. No increased WOB. Abd: Soft, non-tender, non-distended. Skin/Ext:  Warm, dry. No LE edema.  Psych: Pleasant and appropriate.   Assessment & Plan Essential hypertension Blood pressure in clinic today 152/63 > 140/63.  No concerning symptoms of hypo/hypertension identified today. Overall appears stable at this time. -Continue Hyzaar at 100-25 daily -Continue Imdur 30 daily as previously prescribed for chest pain symptoms -BMP today to assess kidney function, electrolytes   Patient to return to clinic in 2 months for follow-up on A1c and diabetes care.  Ivery Quale, MD [facility]

## 2023-02-28 LAB — BASIC METABOLIC PANEL WITH GFR
BUN/Creatinine Ratio: 18 (ref 12–28)
BUN: 19 mg/dL (ref 8–27)
CO2: 29 mmol/L (ref 20–29)
Calcium: 10.1 mg/dL (ref 8.7–10.3)
Chloride: 100 mmol/L (ref 96–106)
Creatinine, Ser: 1.05 mg/dL — ABNORMAL HIGH (ref 0.57–1.00)
Glucose: 92 mg/dL (ref 70–99)
Potassium: 4.1 mmol/L (ref 3.5–5.2)
Sodium: 143 mmol/L (ref 134–144)
eGFR: 53 mL/min/1.73 — ABNORMAL LOW

## 2023-03-04 ENCOUNTER — Telehealth: Payer: Self-pay | Admitting: Family Medicine

## 2023-03-04 ENCOUNTER — Other Ambulatory Visit: Payer: Self-pay | Admitting: Family Medicine

## 2023-03-04 DIAGNOSIS — R079 Chest pain, unspecified: Secondary | ICD-10-CM

## 2023-03-04 MED ORDER — ISOSORBIDE MONONITRATE ER 30 MG PO TB24: 30.0000 mg | ORAL_TABLET | Freq: Every day | ORAL | 3 refills | Status: DC

## 2023-03-04 NOTE — Telephone Encounter (Signed)
Called patient to discuss recent lab results following clinic visit. Discussed stable kidney function and continuing Hyzaar at current dosage.  Patient expressed understanding throughout conversation.

## 2023-03-04 NOTE — Progress Notes (Signed)
During recent phone call with patient, patient mentioned she is out of Imdur and tried to recently refill it. Reviewed chart and refilled rx.

## 2023-03-12 ENCOUNTER — Telehealth: Payer: Self-pay

## 2023-03-12 NOTE — Telephone Encounter (Signed)
 Patient calls nurse line regarding concerns with elevated blood sugar levels for the last week. She reports CBG of 168 yesterday, however, has been as high as 206.   Today CBG was 124.   Reports intermittent episodes of nausea and dizziness.   Scheduled patient follow up appointment for tomorrow morning for further evaluation.   ED precautions discussed.   Chiquita JAYSON English, RN

## 2023-03-13 ENCOUNTER — Encounter: Payer: Self-pay | Admitting: Family Medicine

## 2023-03-13 ENCOUNTER — Ambulatory Visit (INDEPENDENT_AMBULATORY_CARE_PROVIDER_SITE_OTHER): Payer: PPO | Admitting: Family Medicine

## 2023-03-13 VITALS — BP 115/72 | HR 86 | Ht 62.0 in | Wt 180.0 lb

## 2023-03-13 DIAGNOSIS — M791 Myalgia, unspecified site: Secondary | ICD-10-CM | POA: Diagnosis not present

## 2023-03-13 DIAGNOSIS — J069 Acute upper respiratory infection, unspecified: Secondary | ICD-10-CM | POA: Diagnosis not present

## 2023-03-13 LAB — POC SOFIA 2 FLU + SARS ANTIGEN FIA
Influenza A, POC: NEGATIVE
Influenza B, POC: NEGATIVE
SARS Coronavirus 2 Ag: NEGATIVE

## 2023-03-13 NOTE — Progress Notes (Signed)
    SUBJECTIVE:   CHIEF COMPLAINT / HPI:   Cold symptoms Patient started feeling cold symptoms on Tuesday. Reports feeling weak and tired overall. Some L forearm weakness in particular, now resolved. Reports congestion and nonproductive cough. It's been harder to breathe with exertion. No difficulty breathing at rest. No sharp chest pain. Some lightheadedness when getting up at times, no fainting or falls. Some nausea, no vomiting. No diarrhea. Has not been eating as much though still tolerating fluids well. No known fever, though has felt hot and cold. No known sick contacts.    PERTINENT  PMH / PSH: T2DM, HLD, HTN  OBJECTIVE:   BP 115/72   Pulse 86   Ht 5' 2 (1.575 m)   Wt 180 lb (81.6 kg)   SpO2 100%   BMI 32.92 kg/m   General: No acute distress.  HEENT: MMM. Shotty cervical lymphadenopathy bilaterally. Erythematous nasal turbinates bilaterally. MMM. Normal appearing oropharynx without tonsillar enlargement or exudate. No tenderness to palpation of sinuses. Nonbulging, nonerythematous TM bilaterally. No post-auricular erythema or tenderness bilaterally.  CV: Normal S1/S2. No extra heart sounds. Warm and well-perfused. Pulm: Breathing comfortably on room air. CTAB. No increased WOB. Abd: Soft, non-tender, non-distended. Skin/Ext:  Warm, dry. Bilateral UE without erythema or swelling.  Neuro: Alert and oriented x4. Equal sensation and 5/5 strength of upper and lower extremities bilaterally.  CN2: no reported vision changes CN3,4,6: PERRLA. EOMI CN5: Sensation intact BL CN7: Facial expressions limited by dental pain CN8: Hearing intact BL CN9: palate symmetric  CN10: regular speech CN11: turns head against resistance Psych: Pleasant and appropriate.    ASSESSMENT/PLAN:   Assessment & Plan Upper respiratory tract infection, unspecified type Symptoms overall most consistent with viral URI. Reassuring pulm and neuro exam today. Negative flu/covid test in clinic.  - Discussed  supportive care at home - Tylenol /ibuprofen  as needed  - ED precautions discussed and provided in AVS  Return to clinic in 1 month to complete annual physical.   Damien Cassis, MD [facility]

## 2023-03-13 NOTE — Patient Instructions (Addendum)
 Thank you for visiting clinic today - it is always our pleasure to care for you.  You tested negative for Flu and Covid today. You most likely have another viral illness causing your symptoms.   - Please take tylenol  and/or ibuprofen  as needed for symptom relief.  - Please stay hydrated and get plenty of rest.  - Ginger may help with your nausea.   Symptoms typically peak at 2-3 days of illness and then gradually improve over 10-14 days. However, a cough may last 2-4 weeks.   If you notice any of the following, please seek more immediate care at the ED: - Unstoppable vomiting  - Unable to drink fluids  - Sharp chest pain  Please schedule an appointment in 1 month to complete an annual physical. We will check in on your diabetes at that time as well.   Reach out any time with any questions or concerns you may have - we are here for you!  Damien Cassis, MD [facility] [phone]

## 2023-04-30 ENCOUNTER — Ambulatory Visit

## 2023-05-07 NOTE — Progress Notes (Deleted)
    SUBJECTIVE:   CHIEF COMPLAINT / HPI:   ***  PERTINENT  PMH / PSH: HTN, mitral valve prolapse, GERD, T2DM, HLD, IDA, CLL  OBJECTIVE:   There were no vitals taken for this visit.  ***  ASSESSMENT/PLAN:   Assessment & Plan      Para March, DO [facility]

## 2023-05-08 ENCOUNTER — Ambulatory Visit: Admitting: Family Medicine

## 2023-06-03 ENCOUNTER — Other Ambulatory Visit: Payer: Self-pay

## 2023-06-03 ENCOUNTER — Emergency Department (HOSPITAL_COMMUNITY)
Admission: EM | Admit: 2023-06-03 | Discharge: 2023-06-03 | Discharge status: Against Medical Advice | Attending: Emergency Medicine | Admitting: Emergency Medicine

## 2023-06-03 ENCOUNTER — Encounter: Payer: Self-pay | Admitting: Student

## 2023-06-03 ENCOUNTER — Emergency Department (HOSPITAL_COMMUNITY)

## 2023-06-03 ENCOUNTER — Ambulatory Visit: Admitting: Student

## 2023-06-03 ENCOUNTER — Encounter (HOSPITAL_COMMUNITY): Payer: Self-pay | Admitting: Emergency Medicine

## 2023-06-03 VITALS — BP 130/74 | HR 72 | Ht 62.0 in | Wt 183.6 lb

## 2023-06-03 DIAGNOSIS — Z79899 Other long term (current) drug therapy: Secondary | ICD-10-CM | POA: Insufficient documentation

## 2023-06-03 DIAGNOSIS — Z7982 Long term (current) use of aspirin: Secondary | ICD-10-CM | POA: Diagnosis not present

## 2023-06-03 DIAGNOSIS — R42 Dizziness and giddiness: Secondary | ICD-10-CM | POA: Insufficient documentation

## 2023-06-03 DIAGNOSIS — E785 Hyperlipidemia, unspecified: Secondary | ICD-10-CM | POA: Insufficient documentation

## 2023-06-03 DIAGNOSIS — I10 Essential (primary) hypertension: Secondary | ICD-10-CM | POA: Diagnosis not present

## 2023-06-03 DIAGNOSIS — E1165 Type 2 diabetes mellitus with hyperglycemia: Secondary | ICD-10-CM | POA: Diagnosis not present

## 2023-06-03 DIAGNOSIS — Z856 Personal history of leukemia: Secondary | ICD-10-CM | POA: Diagnosis not present

## 2023-06-03 DIAGNOSIS — R9431 Abnormal electrocardiogram [ECG] [EKG]: Secondary | ICD-10-CM | POA: Insufficient documentation

## 2023-06-03 DIAGNOSIS — Z5329 Procedure and treatment not carried out because of patient's decision for other reasons: Secondary | ICD-10-CM | POA: Insufficient documentation

## 2023-06-03 LAB — COMPREHENSIVE METABOLIC PANEL WITH GFR
ALT: 16 U/L (ref 0–44)
AST: 21 U/L (ref 15–41)
Albumin: 3.6 g/dL (ref 3.5–5.0)
Alkaline Phosphatase: 48 U/L (ref 38–126)
Anion gap: 10 (ref 5–15)
BUN: 19 mg/dL (ref 8–23)
CO2: 27 mmol/L (ref 22–32)
Calcium: 9.6 mg/dL (ref 8.9–10.3)
Chloride: 103 mmol/L (ref 98–111)
Creatinine, Ser: 1.23 mg/dL — ABNORMAL HIGH (ref 0.44–1.00)
GFR, Estimated: 44 mL/min — ABNORMAL LOW (ref >60)
Glucose, Bld: 98 mg/dL (ref 70–99)
Potassium: 3.6 mmol/L (ref 3.5–5.1)
Sodium: 140 mmol/L (ref 135–145)
Total Bilirubin: 0.5 mg/dL (ref 0.0–1.2)
Total Protein: 7.6 g/dL (ref 6.5–8.1)

## 2023-06-03 LAB — PROTIME-INR
INR: 1 (ref 0.8–1.2)
Prothrombin Time: 13.6 s (ref 11.4–15.2)

## 2023-06-03 LAB — CBC WITH DIFFERENTIAL/PLATELET
Abs Immature Granulocytes: 0.01 10*3/uL (ref 0.00–0.07)
Basophils Absolute: 0 10*3/uL (ref 0.0–0.1)
Basophils Relative: 0 %
Eosinophils Absolute: 0.3 10*3/uL (ref 0.0–0.5)
Eosinophils Relative: 3 %
HCT: 37.6 % (ref 36.0–46.0)
Hemoglobin: 11.7 g/dL — ABNORMAL LOW (ref 12.0–15.0)
Immature Granulocytes: 0 %
Lymphocytes Relative: 62 %
Lymphs Abs: 5.5 10*3/uL — ABNORMAL HIGH (ref 0.7–4.0)
MCH: 25.6 pg — ABNORMAL LOW (ref 26.0–34.0)
MCHC: 31.1 g/dL (ref 30.0–36.0)
MCV: 82.3 fL (ref 80.0–100.0)
Monocytes Absolute: 0.8 10*3/uL (ref 0.1–1.0)
Monocytes Relative: 9 %
Neutro Abs: 2.4 10*3/uL (ref 1.7–7.7)
Neutrophils Relative %: 26 %
Platelets: 285 10*3/uL (ref 150–400)
RBC: 4.57 MIL/uL (ref 3.87–5.11)
RDW: 14.3 % (ref 11.5–15.5)
WBC: 9.1 10*3/uL (ref 4.0–10.5)
nRBC: 0 % (ref 0.0–0.2)

## 2023-06-03 LAB — URINALYSIS, ROUTINE W REFLEX MICROSCOPIC
Bacteria, UA: NONE SEEN
Bilirubin Urine: NEGATIVE
Glucose, UA: NEGATIVE mg/dL
Hgb urine dipstick: NEGATIVE
Ketones, ur: NEGATIVE mg/dL
Nitrite: NEGATIVE
Protein, ur: NEGATIVE mg/dL
Specific Gravity, Urine: 1.015 (ref 1.005–1.030)
pH: 5 (ref 5.0–8.0)

## 2023-06-03 LAB — CBG MONITORING, ED: Glucose-Capillary: 103 mg/dL — ABNORMAL HIGH (ref 70–99)

## 2023-06-03 NOTE — Discharge Instructions (Addendum)
 Thank you for letting us  evaluate you today.  Your lab work was notable for mild anemia and mild chronic kidney disease per your baseline.  Your CT scan of your head did not show any bleeding.  Unfortunately, we were unable to complete your MRI of your head and are unable to determine whether you have or having a stroke as you have decided to leave AGAINST MEDICAL ADVICE.  You are taking all responsibility on to yourself to include up to death  Return to Emergency Department if you experience significant worsening symptoms, numbness weakness on one side your body, altered mentation, seizures, loss of consciousness, worsening dizziness

## 2023-06-03 NOTE — Progress Notes (Signed)
    SUBJECTIVE:   CHIEF COMPLAINT / HPI:   Amber [name] is a 83 y.o. female presenting for 3 days of increased dizziness that started suddenly.  Patient reports that symptoms have been ongoing and have not resolved.  They do not seem to be worse with changes in position and are constant.  She has not fallen however she feels very unsteady when she walks.  She has a diabetic and her A1c has been well-controlled.  She is currently on aspirin  and pravastatin .  Denies any prior history of stroke or heart attack.  PERTINENT  PMH / PSH: reviewed and updated.  OBJECTIVE:   BP 130/74   Pulse 72   Ht 5\' 2"  (1.575 m)   Wt 183 lb 9.6 oz (83.3 kg)   SpO2 99%   BMI 33.58 kg/m   Well-appearing, no acute distress Cardio: Regular rate, regular rhythm, no murmurs on exam. Pulm: Clear, no wheezing, no crackles. No increased work of breathing   Neuro: CN II: PERRL CN III, IV,VI: EOMI CV V: Normal sensation in V1, V2, V3 CVII: Symmetric smile and brow raise CN VIII: Normal hearing CN IX,X: Symmetric palate raise  CN XI: 5/5 shoulder shrug CN XII: Symmetric tongue protrusion  UE and LE strength 5/5 2+ UE and LE reflexes  Normal sensation in UE and LE bilaterally  No ataxia with finger to nose, normal heel to shin  Positive Rhomberg    ASSESSMENT/PLAN:   Assessment & Plan Dizziness With her acute presentation and risk factors of diabetes I am concerned about an acute cerebellar stroke.  Unfortunately patient is 3 days out from symptom onset so she does not qualify for code stroke.  However she is at a high risk of having another events since she is within the 2 weeks of symptom onset.  I recommend ED referral for head imaging.  Patient is not driving and has her sister here for transportation.  Patient is in agreement with plan will present to [facility] emergency room.  Will call and notify ED triage of patient arrival.     Clem Currier, DO [facility]

## 2023-06-03 NOTE — ED Triage Notes (Signed)
 Pt sent from PCP for CT to rule out stroke. Pt complains of dizziness x 3 days. Pt feels unbalanced walking but no other stroke like symptoms.

## 2023-06-03 NOTE — ED Provider Triage Note (Cosign Needed)
 Emergency Medicine Provider Triage Evaluation Note  Amber [name] , a 83 y.o. female  was evaluated in triage.  Pt complains of dizziness.  Pt sent to Ed ofr stroke evaluation by primary.  Symptoms began 3 days ago  Review of Systems  Positive: Weakness dizziness Negative: Vision or hearing cahnge  Physical Exam  BP (!) 160/88   Pulse 63   Temp 98.3 F (36.8 C)   Resp 15   Ht 5\' 2"  (1.575 m)   Wt 83 kg   SpO2 99%   BMI 33.47 kg/m  Gen:   Awake, no distress   Resp:  Normal effort  MSK:   Moves extremities without difficulty  Other:    Medical Decision Making  Medically screening exam initiated at 4:25 PM.  Appropriate orders placed.  Nichole Barker Giammarino was informed that the remainder of the evaluation will be completed by another provider, this initial triage assessment does not replace that evaluation, and the importance of remaining in the ED until their evaluation is complete.     Sandi Crosby, [place] 06/03/23 1626

## 2023-06-03 NOTE — ED Notes (Signed)
 EDP at bedside speaking with patient regarding risks of leaving AMA. Pt verbalizes understanding and would still like to leave AMA. Pt alert and oriented x4.

## 2023-06-03 NOTE — ED Notes (Signed)
 This RN explained importance of ordered MRI scheduled for pt. Pt states she would like to do the MRI tomorrow and says she doesn't feel like it. EDP notified.

## 2023-06-03 NOTE — Patient Instructions (Signed)
 I am concerned about a cerebellar stroke.  I am sending you to the emergency room to have imaging of your head.  Since your symptoms started 3 days ago you are outside of the window for Antithrombin lytic or clot busting therapy.  However with you being within a 2-week window of having the symptoms the chances of you having another stroke or other event is very high.  Please do not drive until you have been cleared.

## 2023-06-03 NOTE — ED Provider Notes (Signed)
 [place] EMERGENCY DEPARTMENT AT [facility] Provider Note   CSN: [id number] Arrival date & time: 06/03/23  1550     History  Chief Complaint  Patient presents with   Dizziness    Amber [name] is a 83 y.o. female with past medical history of T2DM, HLD, IDA, HTN, mitral valve prolapse, CLL presents emergency department for evaluation of dizziness and off-balance gait that has been occurring over the past 3 days.  She reports she has had dizziness in the past but this feels different due to the off-balance gait.  She reports that the dizziness and off-balance has been "constant" however does not currently feel dizzy or off-balance and reports that it typically worsens with movement. She has not fallen recent, no HA, nor thinners.  She was originally evaluated by her PCP this morning who recommended ED evaluation to rule out a stroke as patient reported constant nonpostural dizziness. Sister drove patient to ED for further evaluation  Currently, patient has no complaints of dizziness. Ambulates without difficulty nor feelings of being off balance. She denies complaints prior to when dizziness started. No CP, SHOB, NVD, urinary symptoms, HA, nor fevers.   Dizziness     Home Medications Prior to Admission medications   Medication Sig Start Date End Date Taking? Authorizing Provider  acetaminophen  (TYLENOL ) 500 MG tablet Take 1 tablet (500 mg total) by mouth every 6 (six) hours as needed. 03/15/18   Fawze, Mina A, PA-C  EQ ASPIRIN  ADULT LOW DOSE 81 MG EC tablet Take 1 tablet by mouth once daily 07/17/20   Cresenzo, John V, MD  fluticasone  (FLONASE ) 50 MCG/ACT nasal spray Place 2 sprays into both nostrils daily. 05/14/22   Espinoza, Alejandra, DO  isosorbide  mononitrate (IMDUR ) 30 MG 24 hr tablet Take 1 tablet (30 mg total) by mouth daily. 03/04/23   Carey Chapman, MD  loratadine  (CLARITIN ) 10 MG tablet Take 1 tablet (10 mg total) by mouth daily. 10/18/22   Quillen, Michael, MD   losartan -hydrochlorothiazide  (HYZAAR) 100-25 MG tablet Take 1 tablet by mouth daily. 01/06/23   Carey Chapman, MD  Multiple Vitamins-Minerals (WOMENS ONE DAILY) TABS Take 1 tablet by mouth daily.    [provider]  Olopatadine  HCl 0.2 % SOLN Apply 1 drop to eye daily. Patient not taking: Reported on 01/06/2023 05/14/22   Espinoza, Alejandra, DO  omeprazole  (PRILOSEC) 20 MG capsule Take 1 capsule (20 mg total) by mouth daily. 09/16/22   Carey Chapman, MD  oxymetazoline  (AFRIN NASAL SPRAY) 0.05 % nasal spray Place 1 spray into both nostrils 2 (two) times daily. Do not take for more than 3 days in a row. Patient not taking: Reported on 01/06/2023 10/18/22   Ivin Marrow, MD  polyethylene glycol powder (GLYCOLAX /MIRALAX ) 17 GM/SCOOP powder Take 17 g by mouth 2 (two) times daily as needed. 01/11/20   Cresenzo, John V, MD  pravastatin  (PRAVACHOL ) 40 MG tablet Take 40 mg by mouth daily.    [provider]  timolol  (BETIMOL ) 0.5 % ophthalmic solution Place 1 drop into the right eye 2 (two) times daily. 04/12/13   Clyde Darling, DO      Allergies    Amoxicillin, Sulfamethoxazole, Sulfonamide derivatives, Codeine, and Penicillins    Review of Systems   Review of Systems  Neurological:  Positive for dizziness.    Physical Exam Updated Vital Signs BP 135/70   Pulse 65   Temp 98.3 F (36.8 C)   Resp 18   Ht 5\' 2"  (1.575 m)  Wt 83 kg   SpO2 97%   BMI 33.47 kg/m  Physical Exam Vitals and nursing note reviewed.  Constitutional:      General: She is not in acute distress.    Appearance: Normal appearance. She is not ill-appearing.  HENT:     Head: Normocephalic and atraumatic.     Right Ear: Hearing, tympanic membrane, ear canal and external ear normal.     Left Ear: Hearing, tympanic membrane, ear canal and external ear normal.  Eyes:     General: No scleral icterus.       Right eye: No discharge.        Left eye: No discharge.     Extraocular Movements:     Right eye:  Normal extraocular motion and no nystagmus.     Left eye: Normal extraocular motion and no nystagmus.     Conjunctiva/sclera: Conjunctivae normal.     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 Reciprocal pupillary changes.  Cardiovascular:     Rate and Rhythm: Normal rate.     Pulses: Normal pulses.  Pulmonary:     Effort: Pulmonary effort is normal. No respiratory distress.     Breath sounds: Normal breath sounds. No stridor. No wheezing or rhonchi.  Chest:     Chest wall: No tenderness.  Abdominal:     General: There is no distension.     Palpations: Abdomen is soft. There is no mass.     Tenderness: There is no abdominal tenderness. There is no guarding.  Musculoskeletal:     Cervical back: Normal range of motion and neck supple. No rigidity or tenderness.     Right lower leg: No edema.     Left lower leg: No edema.  Lymphadenopathy:     Cervical: No cervical adenopathy.  Skin:    General: Skin is warm.     Capillary Refill: Capillary refill takes less than 2 seconds.     Coloration: Skin is not jaundiced or pale.  Neurological:     Mental Status: She is alert and oriented to person, place, and time. Mental status is at baseline.     GCS: GCS eye subscore is 4. GCS verbal subscore is 5. GCS motor subscore is 6.     Cranial Nerves: Cranial nerves 2-12 are intact. No dysarthria or facial asymmetry.     Motor: No tremor, abnormal muscle tone, seizure activity or pronator drift.     Coordination: Heel to Shin Test normal.     Gait: Gait is intact.     Comments: Personally observed ambulation. No unsteady gait, abnormal gait. Denies dizziness with position changes and while ambulating. Following commands appropriately.     ED Results / Procedures / Treatments   Labs (all labs ordered are listed, but only abnormal results are displayed) Labs Reviewed  URINALYSIS, ROUTINE W REFLEX MICROSCOPIC - Abnormal; Notable for the following components:      Result Value   Leukocytes,Ua  TRACE (*)    All other components within normal limits  CBC WITH DIFFERENTIAL/PLATELET - Abnormal; Notable for the following components:   Hemoglobin 11.7 (*)    MCH 25.6 (*)    Lymphs Abs 5.5 (*)    All other components within normal limits  COMPREHENSIVE METABOLIC PANEL WITH GFR - Abnormal; Notable for the following components:   Creatinine, Ser 1.23 (*)    GFR, Estimated 44 (*)    All other components within normal limits  CBG MONITORING, ED - Abnormal; Notable for  the following components:   Glucose-Capillary 103 (*)    All other components within normal limits  PROTIME-INR    EKG None  Radiology CT Head Wo Contrast Result Date: 06/03/2023 CLINICAL DATA:  Vertigo, central EXAM: CT HEAD WITHOUT CONTRAST TECHNIQUE: Contiguous axial images were obtained from the base of the skull through the vertex without intravenous contrast. RADIATION DOSE REDUCTION: This exam was performed according to the departmental dose-optimization program which includes automated exposure control, adjustment of the mA and/or kV according to patient size and/or use of iterative reconstruction technique. COMPARISON:  CT head 03/20/2014, MRI head 03/20/2014 FINDINGS: Brain: Patchy and confluent areas of decreased attenuation are noted throughout the deep and periventricular white matter of the cerebral hemispheres bilaterally, compatible with chronic microvascular ischemic disease. No evidence of large-territorial acute infarction. Redemonstration of chronic left frontal hyperdensity consistent with known DVA-better evaluated on MRI head 03/20/2014. No parenchymal hemorrhage. No mass lesion. No extra-axial collection. No mass effect or midline shift. No hydrocephalus. Basilar cisterns are patent. Vascular: No hyperdense vessel. Atherosclerotic calcifications are present within the cavernous internal carotid arteries. Skull: No acute fracture or focal lesion. Sinuses/Orbits: Paranasal sinuses and mastoid air cells are  clear. The orbits are unremarkable. Other: None. IMPRESSION: No acute intracranial abnormality. Electronically Signed   By: Morgane  Naveau M.D.   On: 06/03/2023 17:09    Procedures Procedures    Medications Ordered in ED Medications - No data to display  ED Course/ Medical Decision Making/ A&P                                 Medical Decision Making Amount and/or Complexity of Data Reviewed Labs: ordered. Radiology: ordered.   Patient presents to the ED for concern of dizziness feeling off balance, this involves an extensive number of treatment options, and is a complaint that carries with it a high risk of complications and morbidity.  The differential diagnosis includes CVA, TIA, UTI, electrolyte abnormality, ICH, encephalopathy, trauma, vertigo   Co morbidities that complicate the patient evaluation  See HPI   Additional history obtained:  Additional history obtained from Family, Nursing, and Outside Medical Records   External records from outside source obtained and reviewed including triage RN note, sister at bedside, PCP note from today   Lab Tests:  I Ordered, and personally interpreted labs.  The pertinent results include:   Creatinine 1.23 (baseline 1.0-1.11 over past three yrs) Hemoglobin 11.7 (baseline 10.3-11.5 over past 3 years) UA with trace leukocytes   Imaging Studies ordered:  I ordered imaging studies including CT head without contrast I independently visualized and interpreted imaging which showed no acute intracranial abnormality I agree with the radiologist interpretation   Cardiac Monitoring:  The patient was maintained on a cardiac monitor.  I personally viewed and interpreted the cardiac monitored which showed an underlying rhythm of: NSR similar to previous EKGs     Consultations Obtained:  I requested consultation with neurology Dr. Bonnita Buttner,  and discussed lab and imaging findings as well as pertinent plan - they recommend: MRI. If  negative can f/u outpatient. If + admission   Problem List / ED Course:  Dizziness Conflicting stories. On one hand she reports that dizzy is "constant" however then she reports that it worsens with posture changes and she does not have dizziness at rest.  While I ambulated her and while in ED, she has no complaints of dizziness nor unsteady gait.  Neurologically intact per baseline. Is blind in right eye and has been for the past year.  She follows ophthalmology for this with recent appointment this month Labs so far are reassuring.  UA negative Has not had any dizziness nor off-balance feeling since being in ED Vital signs WNL CT negative for ICH While waiting for MRI scan, patient wishes to leave AMA as she is tired and does not feel like waiting any longer. I discussed AMA and our recommendations.  She expressed understanding with the plan.  She is fully medically competent to make this decision on her own   Reevaluation:  After the interventions noted above, I reevaluated the patient and found that they have :improved    Dispostion:  Date: 06/03/2023 Patient: Amber [name] Admitted: 06/03/2023  4:07 PM Attending Provider: Sallyanne Creamer, DO  Amber [name] or her authorized caregiver has made the decision for the patient to leave the emergency department against the advice of Sallyanne Creamer, DO.  She or her authorized caregiver has been informed and understands the inherent risks, including death.  She or her authorized caregiver has decided to accept the responsibility for this decision. Amber [name] and all necessary parties have been advised that she may return for further evaluation or treatment. Her condition at time of discharge was Stable.  Amber [name] had current vital signs as follows:  Blood pressure 135/70, pulse 65, temperature 98.3 F (36.8 C), resp. rate 18, height 5\' 2"  (1.575 m), weight 83 kg, SpO2 97%.   Amber [name] or  her authorized caregiver has signed the Leaving Against Medical Advice form prior to leaving the department.  Amber [name] 06/03/2023   Final Clinical Impression(s) / ED Diagnoses Final diagnoses:  Dizziness    Rx / DC Orders ED Discharge Orders     None         Amber Cords, PA 06/03/23 1952    Sallyanne Creamer, DO 06/09/23 1146

## 2023-06-04 ENCOUNTER — Emergency Department (HOSPITAL_COMMUNITY)
Admission: EM | Admit: 2023-06-04 | Discharge: 2023-06-04 | Disposition: A | Discharge status: Home / Self Care | Attending: Emergency Medicine | Admitting: Emergency Medicine

## 2023-06-04 ENCOUNTER — Emergency Department (HOSPITAL_COMMUNITY)

## 2023-06-04 DIAGNOSIS — R42 Dizziness and giddiness: Secondary | ICD-10-CM | POA: Diagnosis present

## 2023-06-04 MED ORDER — MECLIZINE HCL 25 MG PO TABS: 25.0000 mg | ORAL_TABLET | Freq: Three times a day (TID) | ORAL | 0 refills | Status: AC | PRN

## 2023-06-04 NOTE — ED Triage Notes (Signed)
 Pt was evaluated yesterday for dizziness. Pt states they wanted her to have and MRI but she left AMA and said she would come back today. Pt states her head feels full.

## 2023-06-04 NOTE — ED Provider Notes (Signed)
 [place] EMERGENCY DEPARTMENT AT [facility] Provider Note   CSN: [id number] Arrival date & time: 06/04/23  1332     History  Chief Complaint  Patient presents with   Dizziness    Amber [name] is a 83 y.o. female.  Patient is a 83 year old female who presents with dizziness.  She was seen here yesterday and was supposed to get an MRI but she did not want to wait any longer and left.  She was still concerned so came back today to get the MRI.  She reports dizziness for the last 3 days.  She says it is somewhat worse with head movement and sometimes she has a spinning sensation but also sometimes worse when she stands up.  She denies any numbness or weakness to her extremities.  At times she feels off balance.  She denies any vision changes or speech deficits.  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  This is chronic and unchanged from baseline.  She says her dizziness seems to be a little bit better than it was yesterday but she still gets dizzy at times when she puts her head to a certain position.  She denies any recent illnesses.  No fevers, cough or cold symptoms.  No recent vomiting or diarrhea.       Home Medications Prior to Admission medications   Medication Sig Start Date End Date Taking? Authorizing Provider  meclizine  (ANTIVERT ) 25 MG tablet Take 1 tablet (25 mg total) by mouth 3 (three) times daily as needed for dizziness. 06/04/23  Yes Hershel Los, MD  acetaminophen  (TYLENOL ) 500 MG tablet Take 1 tablet (500 mg total) by mouth every 6 (six) hours as needed. 03/15/18   Fawze, Mina A, PA-C  EQ ASPIRIN  ADULT LOW DOSE 81 MG EC tablet Take 1 tablet by mouth once daily 07/17/20   Cresenzo, John V, MD  fluticasone  (FLONASE ) 50 MCG/ACT nasal spray Place 2 sprays into both nostrils daily. 05/14/22   Espinoza, Alejandra, DO  isosorbide  mononitrate (IMDUR ) 30 MG 24 hr tablet Take 1 tablet (30 mg total) by mouth daily. 03/04/23   Carey Chapman, MD  loratadine  (CLARITIN )  10 MG tablet Take 1 tablet (10 mg total) by mouth daily. 10/18/22   Quillen, Michael, MD  losartan -hydrochlorothiazide  (HYZAAR) 100-25 MG tablet Take 1 tablet by mouth daily. 01/06/23   Carey Chapman, MD  Multiple Vitamins-Minerals (WOMENS ONE DAILY) TABS Take 1 tablet by mouth daily.    [provider]  Olopatadine  HCl 0.2 % SOLN Apply 1 drop to eye daily. Patient not taking: Reported on 01/06/2023 05/14/22   Espinoza, Alejandra, DO  omeprazole  (PRILOSEC) 20 MG capsule Take 1 capsule (20 mg total) by mouth daily. 09/16/22   Carey Chapman, MD  oxymetazoline  (AFRIN NASAL SPRAY) 0.05 % nasal spray Place 1 spray into both nostrils 2 (two) times daily. Do not take for more than 3 days in a row. Patient not taking: Reported on 01/06/2023 10/18/22   Ivin Marrow, MD  polyethylene glycol powder (GLYCOLAX /MIRALAX ) 17 GM/SCOOP powder Take 17 g by mouth 2 (two) times daily as needed. 01/11/20   Cresenzo, John V, MD  pravastatin  (PRAVACHOL ) 40 MG tablet Take 40 mg by mouth daily.    [provider]  timolol  (BETIMOL ) 0.5 % ophthalmic solution Place 1 drop into the right eye 2 (two) times daily. 04/12/13   Clyde Darling, DO      Allergies    Amoxicillin, Sulfamethoxazole, Sulfonamide derivatives, Codeine, and Penicillins  Review of Systems   Review of Systems  Constitutional:  Negative for chills, diaphoresis, fatigue and fever.  HENT:  Negative for congestion, rhinorrhea and sneezing.   Eyes: Negative.   Respiratory:  Negative for cough, chest tightness and shortness of breath.   Cardiovascular:  Negative for chest pain and leg swelling.  Gastrointestinal:  Negative for abdominal pain, blood in stool, diarrhea, nausea and vomiting.  Genitourinary:  Negative for difficulty urinating, flank pain, frequency and hematuria.  Musculoskeletal:  Negative for arthralgias and back pain.  Skin:  Negative for rash.  Neurological:  Positive for dizziness. Negative for speech difficulty, weakness,  numbness and headaches.    Physical Exam Updated Vital Signs BP 136/72 (BP Location: Right Arm)   Pulse 67   Temp 98.1 F (36.7 C) (Oral)   Resp 18   Wt 83 kg   SpO2 100%   BMI 33.47 kg/m  Physical Exam Constitutional:      Appearance: She is well-developed.  HENT:     Head: Normocephalic and atraumatic.  Eyes:     Pupils: Pupils are equal, round, and reactive to light.  Cardiovascular:     Rate and Rhythm: Normal rate and regular rhythm.     Heart sounds: Normal heart sounds.  Pulmonary:     Effort: Pulmonary effort is normal. No respiratory distress.     Breath sounds: Normal breath sounds. No wheezing or rales.  Chest:     Chest wall: No tenderness.  Abdominal:     General: Bowel sounds are normal.     Palpations: Abdomen is soft.     Tenderness: There is no abdominal tenderness. There is no guarding or rebound.  Musculoskeletal:        General: Normal range of motion.     Cervical back: Normal range of motion and neck supple.  Lymphadenopathy:     Cervical: No cervical adenopathy.  Skin:    General: Skin is warm and dry.     Findings: No rash.  Neurological:     Mental Status: She is alert and oriented to person, place, and time.     Comments: Motor 5/5 all extremities Sensation grossly intact to LT all extremities Finger to Nose intact, no pronator drift CN II-XII grossly intact, blindness to right eye Gait normal      ED Results / Procedures / Treatments   Labs (all labs ordered are listed, but only abnormal results are displayed) Labs Reviewed - No data to display  EKG EKG Interpretation Date/Time:  Wednesday June 04 2023 13:55:04 EDT Ventricular Rate:  71 PR Interval:  166 QRS Duration:  94 QT Interval:  410 QTC Calculation: 445 R Axis:   69  Text Interpretation: Normal sinus rhythm Possible Anterior infarct , age undetermined Abnormal ECG When compared with ECG of 03-Jun-2023 16:12, PREVIOUS ECG IS PRESENT since last tracing no significant  change Confirmed by Hershel Los [phone]) on 06/04/2023 4:45:32 PM  Radiology MR BRAIN WO CONTRAST Result Date: 06/04/2023 CLINICAL DATA:  Initial evaluation for acute neuro deficit, stroke suspected. EXAM: MRI HEAD WITHOUT CONTRAST TECHNIQUE: Multiplanar, multiecho pulse sequences of the brain and surrounding structures were obtained without intravenous contrast. COMPARISON:  CT from 06/03/2023 and MRI from 03/20/2014. FINDINGS: Brain: Mild age-related cerebral volume loss. Patchy T2/FLAIR hyperintensity involving the periventricular and deep white matter both cerebral hemispheres, consistent chronic small vessel ischemic disease, mild for age. Small remote lacunar infarct noted at the right thalamus. Slight/incidental left frontal DVA noted. Surrounding T2/FLAIR signal intensity  consistent with associated steal phenomena. No evidence for acute or subacute infarct. No areas of chronic cortical infarction. No acute or chronic intracranial blood products. No mass lesion, midline shift or mass effect. No hydrocephalus or extra-axial fluid collection. Pituitary gland within normal limits. Vascular: Major intracranial vascular flow voids are maintained. Skull and upper cervical spine: Craniocervical junction within normal limits. Bone marrow signal intensity normal. Moderate spondylosis noted within the visualized upper cervical spine. No scalp soft tissue abnormality. Sinuses/Orbits: Globes orbital soft tissues within normal limits. Paranasal sinuses are largely clear. No mastoid effusion. Other: None. IMPRESSION: 1. No acute intracranial abnormality. 2. Mild age-related cerebral volume loss with chronic small vessel ischemic disease, with small remote lacunar infarct at the right thalamus. 3. 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 Electronically Signed   By: Virgia Griffins M.D.   On: 06/04/2023 18:16   CT Head Wo Contrast Result Date: 06/03/2023 CLINICAL DATA:  Vertigo, central EXAM:  CT HEAD WITHOUT CONTRAST TECHNIQUE: Contiguous axial images were obtained from the base of the skull through the vertex without intravenous contrast. RADIATION DOSE REDUCTION: This exam was performed according to the departmental dose-optimization program which includes automated exposure control, adjustment of the mA and/or kV according to patient size and/or use of iterative reconstruction technique. COMPARISON:  CT head 03/20/2014, MRI head 03/20/2014 FINDINGS: Brain: Patchy and confluent areas of decreased attenuation are noted throughout the deep and periventricular white matter of the cerebral hemispheres bilaterally, compatible with chronic microvascular ischemic disease. No evidence of large-territorial acute infarction. Redemonstration of chronic left frontal hyperdensity consistent with known DVA-better evaluated on MRI head 03/20/2014. No parenchymal hemorrhage. No mass lesion. No extra-axial collection. No mass effect or midline shift. No hydrocephalus. Basilar cisterns are patent. Vascular: No hyperdense vessel. Atherosclerotic calcifications are present within the cavernous internal carotid arteries. Skull: No acute fracture or focal lesion. Sinuses/Orbits: Paranasal sinuses and mastoid air cells are clear. The orbits are unremarkable. Other: None. IMPRESSION: No acute intracranial abnormality. Electronically Signed   By: Morgane  Naveau M.D.   On: 06/03/2023 17:09    Procedures Procedures    Medications Ordered in ED Medications - No data to display  ED Course/ Medical Decision Making/ A&P                                 Medical Decision Making Amount and/or Complexity of Data Reviewed Radiology: ordered.   Patient is a 83 year old who presents with dizziness.  She had labs yesterday.  I reviewed these.  Did not look like anything was concerning.  Her urine did not look infected.  She was supposed to get an MRI but left and came back today.  MRI was performed which does not show any  acute abnormalities.  No evidence of acute stroke.  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  She is currently asymptomatic.  She is able to ambulate without ataxia.  She does report some intermittent ongoing dizziness.  Will try meclizine .  Encouraged her to have close follow-up with her PCP to ensure improvement.  Return precautions were given.  Final Clinical Impression(s) / ED Diagnoses Final diagnoses:  Dizziness    Rx / DC Orders ED Discharge Orders          Ordered    meclizine  (ANTIVERT ) 25 MG tablet  3 times daily PRN        06/04/23 1828  Hershel Los, MD 06/04/23 [phone]

## 2023-06-26 ENCOUNTER — Ambulatory Visit: Admitting: Family Medicine

## 2023-07-01 ENCOUNTER — Ambulatory Visit (INDEPENDENT_AMBULATORY_CARE_PROVIDER_SITE_OTHER): Admitting: Family Medicine

## 2023-07-01 ENCOUNTER — Encounter: Payer: Self-pay | Admitting: Family Medicine

## 2023-07-01 VITALS — BP 136/78 | HR 70 | Ht 62.0 in | Wt 187.4 lb

## 2023-07-01 DIAGNOSIS — R42 Dizziness and giddiness: Secondary | ICD-10-CM

## 2023-07-01 DIAGNOSIS — I10 Essential (primary) hypertension: Secondary | ICD-10-CM

## 2023-07-01 DIAGNOSIS — M542 Cervicalgia: Secondary | ICD-10-CM | POA: Diagnosis not present

## 2023-07-01 NOTE — Assessment & Plan Note (Signed)
 Mildly elevated Cr on last BMP. Will recheck today. Discussed scheduling visit with PCP for follow up of HTN and other chronic conditions

## 2023-07-01 NOTE — Patient Instructions (Signed)
 It was wonderful to see you today.  Please bring ALL of your medications with you to every visit.   Today we talked about:  Im so glad your dizziness is better! If you experience this again, please call our office or present to the emergency room if you are considered.  For your neck pain, I think you likely just pulled a muscle. You can try IcyHot or Voltaren  gel for this pain. Massage that into the muscle twice a day.   Thank you for choosing [facility].   Please call [phone] with any questions about today's appointment.  Please arrive at least 15 minutes prior to your scheduled appointments.   If you had blood work today, I will send you a MyChart message or a letter if results are normal. Otherwise, I will give you a call.    Johnella Naas, MD Family Medicine

## 2023-07-01 NOTE — Progress Notes (Cosign Needed Addendum)
    SUBJECTIVE:   CHIEF COMPLAINT / HPI:   Patient presents for follow up of dizziness. Picked up pill for dizziness (meclizine ). Last week took one. Denies any more dizziness. Feels this has resolved completely. No falls at home.   Has a crick in her neck that started last weekend. Has tried rubbing alcohol on it. Only hurts to turn neck and at night when she sleeps. Has tried tylenol  and it didn't help.   PERTINENT  PMH / PSH:  HTN DMII HLD   OBJECTIVE:   BP 136/78   Pulse 70   Ht 5\' 2"  (1.575 m)   Wt 187 lb 6 oz (85 kg)   SpO2 100%   BMI 34.27 kg/m   General: A&O, NAD HEENT: No sign of trauma, EOM grossly intact. Head with full ROM. Muscle tightness palpated along SCM. Mildly TTP along neck. No LDA noted, no obvious masses.  Cardiac: RRR, no m/r/g Respiratory: CTAB, normal WOB GI: Soft, NTTP Extremities: NTTP, no peripheral edema. Neuro: Normal gait, moves all four extremities appropriately. No gross abnormalities noted. Negative rhomberg  Psych: Appropriate mood and affect   ASSESSMENT/PLAN:   Assessment & Plan Essential hypertension, benign Mildly elevated Cr on last BMP. Will recheck today. Discussed scheduling visit with PCP for follow up of HTN and other chronic conditions  Dizziness Reports this is resolved. No acte intracranial abnormalities noted on imaging from patients visit to ED. No concerning findings on physical exam. Discussed return precautions.  Neck pain, musculoskeletal Likely MSK related. Discussed using IcyHot and massaging area as well as using supportive pillow at night.    Johnella Naas, MD [facility]

## 2023-07-02 LAB — BASIC METABOLIC PANEL WITH GFR
BUN/Creatinine Ratio: 19 (ref 12–28)
BUN: 19 mg/dL (ref 8–27)
CO2: 24 mmol/L (ref 20–29)
Calcium: 9.7 mg/dL (ref 8.7–10.3)
Chloride: 102 mmol/L (ref 96–106)
Creatinine, Ser: 1.02 mg/dL — ABNORMAL HIGH (ref 0.57–1.00)
Glucose: 166 mg/dL — ABNORMAL HIGH (ref 70–99)
Potassium: 3.9 mmol/L (ref 3.5–5.2)
Sodium: 142 mmol/L (ref 134–144)
eGFR: 55 mL/min/{1.73_m2} — ABNORMAL LOW (ref >59)

## 2023-07-03 ENCOUNTER — Other Ambulatory Visit: Payer: Self-pay | Admitting: Family Medicine

## 2023-07-03 NOTE — Telephone Encounter (Signed)
 Chart reviewed. Rx refilled.

## 2023-07-03 NOTE — Progress Notes (Signed)
 Amber [name]

## 2023-07-04 ENCOUNTER — Ambulatory Visit: Payer: Self-pay | Admitting: Family Medicine

## 2023-07-04 NOTE — Telephone Encounter (Signed)
Called patient to discuss lab results. All questions answered.

## 2023-07-10 ENCOUNTER — Emergency Department (HOSPITAL_COMMUNITY)

## 2023-07-10 ENCOUNTER — Emergency Department (HOSPITAL_COMMUNITY)
Admission: EM | Admit: 2023-07-10 | Discharge: 2023-07-10 | Disposition: A | Discharge status: Home / Self Care | Attending: Emergency Medicine | Admitting: Emergency Medicine

## 2023-07-10 ENCOUNTER — Other Ambulatory Visit: Payer: Self-pay

## 2023-07-10 ENCOUNTER — Encounter (HOSPITAL_COMMUNITY): Payer: Self-pay | Admitting: *Deleted

## 2023-07-10 DIAGNOSIS — Z7982 Long term (current) use of aspirin: Secondary | ICD-10-CM | POA: Diagnosis not present

## 2023-07-10 DIAGNOSIS — K219 Gastro-esophageal reflux disease without esophagitis: Secondary | ICD-10-CM | POA: Diagnosis not present

## 2023-07-10 DIAGNOSIS — R079 Chest pain, unspecified: Secondary | ICD-10-CM | POA: Diagnosis present

## 2023-07-10 LAB — CBC
HCT: 37.9 % (ref 36.0–46.0)
Hemoglobin: 11.5 g/dL — ABNORMAL LOW (ref 12.0–15.0)
MCH: 25.2 pg — ABNORMAL LOW (ref 26.0–34.0)
MCHC: 30.3 g/dL (ref 30.0–36.0)
MCV: 82.9 fL (ref 80.0–100.0)
Platelets: 281 10*3/uL (ref 150–400)
RBC: 4.57 MIL/uL (ref 3.87–5.11)
RDW: 14.4 % (ref 11.5–15.5)
WBC: 7.5 10*3/uL (ref 4.0–10.5)
nRBC: 0 % (ref 0.0–0.2)

## 2023-07-10 LAB — HEPATIC FUNCTION PANEL
ALT: 18 U/L (ref 0–44)
AST: 25 U/L (ref 15–41)
Albumin: 3.7 g/dL (ref 3.5–5.0)
Alkaline Phosphatase: 51 U/L (ref 38–126)
Bilirubin, Direct: <0.1 mg/dL (ref 0.0–0.2)
Total Bilirubin: 0.6 mg/dL (ref 0.0–1.2)
Total Protein: 8 g/dL (ref 6.5–8.1)

## 2023-07-10 LAB — URINALYSIS, ROUTINE W REFLEX MICROSCOPIC
Bilirubin Urine: NEGATIVE
Glucose, UA: NEGATIVE mg/dL
Hgb urine dipstick: NEGATIVE
Ketones, ur: NEGATIVE mg/dL
Leukocytes,Ua: NEGATIVE
Nitrite: NEGATIVE
Protein, ur: NEGATIVE mg/dL
Specific Gravity, Urine: 1.008 (ref 1.005–1.030)
pH: 7 (ref 5.0–8.0)

## 2023-07-10 LAB — BASIC METABOLIC PANEL WITH GFR
Anion gap: 7 (ref 5–15)
BUN: 16 mg/dL (ref 8–23)
CO2: 29 mmol/L (ref 22–32)
Calcium: 9.9 mg/dL (ref 8.9–10.3)
Chloride: 104 mmol/L (ref 98–111)
Creatinine, Ser: 1.05 mg/dL — ABNORMAL HIGH (ref 0.44–1.00)
GFR, Estimated: 53 mL/min — ABNORMAL LOW (ref >60)
Glucose, Bld: 189 mg/dL — ABNORMAL HIGH (ref 70–99)
Potassium: 3.9 mmol/L (ref 3.5–5.1)
Sodium: 140 mmol/L (ref 135–145)

## 2023-07-10 LAB — TROPONIN I (HIGH SENSITIVITY)
Troponin I (High Sensitivity): 5 ng/L (ref <18)
Troponin I (High Sensitivity): 8 ng/L (ref <18)

## 2023-07-10 LAB — LIPASE, BLOOD: Lipase: 34 U/L (ref 11–51)

## 2023-07-10 MED ORDER — IOHEXOL 350 MG/ML SOLN
75.0000 mL | Freq: Once | INTRAVENOUS | Status: AC | PRN
  Administered 2023-07-10: 75 mL via INTRAVENOUS

## 2023-07-10 MED ORDER — PANTOPRAZOLE SODIUM 40 MG PO TBEC: 40.0000 mg | DELAYED_RELEASE_TABLET | Freq: Every day | ORAL | 0 refills | Status: AC

## 2023-07-10 MED ORDER — PANTOPRAZOLE SODIUM 40 MG PO TBEC
40.0000 mg | DELAYED_RELEASE_TABLET | Freq: Once | ORAL | Status: AC
  Administered 2023-07-10: 40 mg via ORAL
  Filled 2023-07-10: qty 1

## 2023-07-10 MED ORDER — ALUM & MAG HYDROXIDE-SIMETH 200-200-20 MG/5ML PO SUSP
30.0000 mL | Freq: Once | ORAL | Status: AC
  Administered 2023-07-10: 30 mL via ORAL
  Filled 2023-07-10: qty 30

## 2023-07-10 MED ORDER — SUCRALFATE 1 GM/10ML PO SUSP: 1.0000 g | Freq: Three times a day (TID) | ORAL | 0 refills | Status: AC

## 2023-07-10 NOTE — ED Provider Notes (Signed)
 [place] EMERGENCY DEPARTMENT AT [facility] Provider Note   CSN: [id number] Arrival date & time: 07/10/23  0944     History  Chief Complaint  Patient presents with   Chest Pain    Amber [name] is a 83 y.o. female.  HPI 83 year old female presents with left-sided pain.  The pain is up under her breast.  Feels like a burning sensation.  Started yesterday and has come and gone.  Typically lasts around 30 minutes at a time.  Nothing specific sets it off though she did notice that eating this morning made it worse and so she stopped eating.  No vomiting or bloody/black stools.  No shortness of breath, diaphoresis, back pain.  The pain does not radiate.  No exertional component.  She thought it was reflux, which she has had once before but not as bad and tried Tums but it did not help.  Home Medications Prior to Admission medications   Medication Sig Start Date End Date Taking? Authorizing Provider  pantoprazole  (PROTONIX ) 40 MG tablet Take 1 tablet (40 mg total) by mouth daily. 07/10/23  Yes Jerilynn Montenegro, MD  sucralfate (CARAFATE) 1 GM/10ML suspension Take 10 mLs (1 g total) by mouth 3 (three) times daily with meals. 07/10/23  Yes Jerilynn Montenegro, MD  acetaminophen  (TYLENOL ) 500 MG tablet Take 1 tablet (500 mg total) by mouth every 6 (six) hours as needed. 03/15/18   Fawze, Mina A, PA-C  EQ ASPIRIN  ADULT LOW DOSE 81 MG EC tablet Take 1 tablet by mouth once daily 07/17/20   Cresenzo, John V, MD  fluticasone  (FLONASE ) 50 MCG/ACT nasal spray Place 2 sprays into both nostrils daily. 05/14/22   Espinoza, Alejandra, DO  isosorbide  mononitrate (IMDUR ) 30 MG 24 hr tablet Take 1 tablet (30 mg total) by mouth daily. Patient not taking: Reported on 07/01/2023 03/04/23   Carey Chapman, MD  loratadine  (CLARITIN ) 10 MG tablet Take 1 tablet (10 mg total) by mouth daily. Patient not taking: Reported on 07/01/2023 10/18/22   Ivin Marrow, MD  losartan -hydrochlorothiazide  (HYZAAR) 100-25 MG  tablet TAKE 1 TABLET BY MOUTH DAILY. 07/03/23   Carey Chapman, MD  meclizine  (ANTIVERT ) 25 MG tablet Take 1 tablet (25 mg total) by mouth 3 (three) times daily as needed for dizziness. 06/04/23   Hershel Los, MD  Multiple Vitamins-Minerals (WOMENS ONE DAILY) TABS Take 1 tablet by mouth daily.    [provider]  Olopatadine  HCl 0.2 % SOLN Apply 1 drop to eye daily. Patient not taking: Reported on 01/06/2023 05/14/22   Espinoza, Alejandra, DO  oxymetazoline  (AFRIN NASAL SPRAY) 0.05 % nasal spray Place 1 spray into both nostrils 2 (two) times daily. Do not take for more than 3 days in a row. Patient not taking: Reported on 07/01/2023 10/18/22   Ivin Marrow, MD  polyethylene glycol powder (GLYCOLAX /MIRALAX ) 17 GM/SCOOP powder Take 17 g by mouth 2 (two) times daily as needed. Patient not taking: Reported on 07/01/2023 01/11/20   Cresenzo, John V, MD  pravastatin  (PRAVACHOL ) 40 MG tablet Take 40 mg by mouth daily.    [provider]  timolol  (BETIMOL ) 0.5 % ophthalmic solution Place 1 drop into the right eye 2 (two) times daily. 04/12/13   Clyde Darling, DO      Allergies    Amoxicillin, Sulfamethoxazole, Sulfonamide derivatives, Codeine, and Penicillins    Review of Systems   Review of Systems  Respiratory:  Negative for shortness of breath.   Cardiovascular:  Positive for chest pain.  Gastrointestinal:  Negative for blood in stool and vomiting.  Musculoskeletal:  Negative for back pain.    Physical Exam Updated Vital Signs BP 128/60   Pulse 63   Temp 98 F (36.7 C) (Oral)   Resp 19   SpO2 99%  Physical Exam Vitals and nursing note reviewed.  Constitutional:      Appearance: She is well-developed.  HENT:     Head: Normocephalic and atraumatic.  Cardiovascular:     Rate and Rhythm: Normal rate and regular rhythm.     Heart sounds: Normal heart sounds.  Pulmonary:     Effort: Pulmonary effort is normal.     Breath sounds: Normal breath sounds.  Abdominal:      Palpations: Abdomen is soft.     Tenderness: There is abdominal tenderness in the epigastric area and left upper quadrant.  Skin:    General: Skin is warm and dry.  Neurological:     Mental Status: She is alert.     ED Results / Procedures / Treatments   Labs (all labs ordered are listed, but only abnormal results are displayed) Labs Reviewed  BASIC METABOLIC PANEL WITH GFR - Abnormal; Notable for the following components:      Result Value   Glucose, Bld 189 (*)    Creatinine, Ser 1.05 (*)    GFR, Estimated 53 (*)    All other components within normal limits  CBC - Abnormal; Notable for the following components:   Hemoglobin 11.5 (*)    MCH 25.2 (*)    All other components within normal limits  HEPATIC FUNCTION PANEL  LIPASE, BLOOD  URINALYSIS, ROUTINE W REFLEX MICROSCOPIC  TROPONIN I (HIGH SENSITIVITY)  TROPONIN I (HIGH SENSITIVITY)    EKG EKG Interpretation Date/Time:  Thursday July 10 2023 11:41:51 EDT Ventricular Rate:  66 PR Interval:  169 QRS Duration:  101 QT Interval:  428 QTC Calculation: 449 R Axis:   -33  Text Interpretation: Sinus rhythm Left axis deviation nonspecific ST changes similar to earlier in the day Confirmed by Jerilynn Montenegro [phone]) on 07/10/2023 1:21:56 PM  Radiology CT ABDOMEN PELVIS W CONTRAST Result Date: 07/10/2023 CLINICAL DATA:  LUQ abdominal pain EXAM: CT ABDOMEN AND PELVIS WITH CONTRAST TECHNIQUE: Multidetector CT imaging of the abdomen and pelvis was performed using the standard protocol following bolus administration of intravenous contrast. RADIATION DOSE REDUCTION: This exam was performed according to the departmental dose-optimization program which includes automated exposure control, adjustment of the mA and/or kV according to patient size and/or use of iterative reconstruction technique. CONTRAST:  75mL OMNIPAQUE  IOHEXOL  350 MG/ML SOLN COMPARISON:  CT abdomen/pelvis dated 06/17/2022. FINDINGS: Lower chest: No acute abnormality.  Hepatobiliary: Stable subcentimeter focal hypodensities in the right hepatic lobe are not significantly changed since prior exam in 2017, most compatible with a benign etiology. No new suspicious focal hepatic lesion. Gallbladder is unremarkable. No biliary dilatation. Pancreas: Unremarkable. No pancreatic ductal dilatation or surrounding inflammatory changes. Spleen: Normal in size without focal abnormality. Adrenals/Urinary Tract: Adrenal glands are unremarkable. No suspicious focal lesion. No urolithiasis or hydronephrosis. Mild circumferential bladder wall thickening. Stomach/Bowel: Stomach is within normal limits bowel is grossly unremarkable. No evidence of obstruction. The appendix is not clearly identified. No focal inflammatory changes at the level of the cecum or elsewhere. Diffuse colonic diverticulosis, most pronounced in the descending and sigmoid colon without evidence of acute diverticulitis. Vascular/Lymphatic: Abdominal aorta is normal in caliber with atherosclerotic calcification. No enlarged abdominal or pelvic lymph nodes. Reproductive: Status post hysterectomy.  No adnexal masses. Other: Small fat containing umbilical hernia. No abdominopelvic ascites. No intraperitoneal free air. Musculoskeletal: No acute osseous abnormality. No suspicious osseous lesion. Disc height loss and desiccation at L4-L5 with similar degenerative mild grade 1 anterolisthesis of L4 on L5. Degenerative changes of the pubic symphysis. IMPRESSION: 1. Mild circumferential bladder wall thickening may be due to underdistention or cystitis. Recommend correlation with urinalysis. 2. Colonic diverticulosis without evidence of acute diverticulitis. 3. Aortic Atherosclerosis (ICD10-I70.0). Electronically Signed   By: Mannie Seek M.D.   On: 07/10/2023 12:31   DG Chest 2 View Result Date: 07/10/2023 CLINICAL DATA:  Chest pain. EXAM: CHEST - 2 VIEW COMPARISON:  April 03, 2019. FINDINGS: The heart size and mediastinal  contours are within normal limits. Both lungs are clear. The visualized skeletal structures are unremarkable. IMPRESSION: No active cardiopulmonary disease. Electronically Signed   By: Rosalene Colon M.D.   On: 07/10/2023 10:34    Procedures Procedures    Medications Ordered in ED Medications  alum & mag hydroxide-simeth (MAALOX/MYLANTA) 200-200-20 MG/5ML suspension 30 mL (30 mLs Oral Given 07/10/23 1220)  iohexol  (OMNIPAQUE ) 350 MG/ML injection 75 mL (75 mLs Intravenous Contrast Given 07/10/23 1203)  pantoprazole  (PROTONIX ) EC tablet 40 mg (40 mg Oral Given 07/10/23 1408)    ED Course/ Medical Decision Making/ A&P                                 Medical Decision Making Amount and/or Complexity of Data Reviewed Labs: ordered.    Details: Normal troponins x 2 Radiology: ordered and independent interpretation performed.    Details: No bowel obstruction ECG/medicine tests: ordered and independent interpretation performed.    Details: No ischemia compared to baseline  Risk OTC drugs. Prescription drug management.   Patient's pain is more epigastric than chest.  CT is unremarkable.  Gallbladder appears normal.  LFTs and lipase are unremarkable.  Troponins were sent but are unremarkable as well.  However this sounds more like GERD rather than ACS.  She feels a lot better with the GI cocktail.  Will put her on Protonix  and Carafate, but I think this is less likely to be cardiac and I think she is stable for outpatient follow-up with PCP.  No signs or symptoms of bleeding.  Will discharge home with return precautions.        Final Clinical Impression(s) / ED Diagnoses Final diagnoses:  Gastroesophageal reflux disease without esophagitis    Rx / DC Orders ED Discharge Orders          Ordered    pantoprazole  (PROTONIX ) 40 MG tablet  Daily        07/10/23 1518    sucralfate (CARAFATE) 1 GM/10ML suspension  3 times daily with meals        07/10/23 1518               Jerilynn Montenegro, MD 07/10/23 1520

## 2023-07-10 NOTE — Discharge Instructions (Signed)
 If you develop worsening, continued, or recurrent abdominal pain, uncontrolled vomiting, fever, chest or back pain, or any other new/concerning symptoms then return to the ER for evaluation.

## 2023-07-10 NOTE — ED Triage Notes (Signed)
 Left sided chest pain which began yesterday am.  Pt initially thought it was indigestion and took tums which gave her temporary relief but then the pain returned.  No sob, no nausea or diaphoresis with this.

## 2023-07-10 NOTE — ED Notes (Signed)
 Called [facility]

## 2023-07-17 ENCOUNTER — Ambulatory Visit: Admitting: Family Medicine

## 2023-08-12 ENCOUNTER — Other Ambulatory Visit: Payer: Self-pay

## 2023-08-12 DIAGNOSIS — J302 Other seasonal allergic rhinitis: Secondary | ICD-10-CM

## 2023-08-12 MED ORDER — FLUTICASONE PROPIONATE 50 MCG/ACT NA SUSP: 2.0000 | Freq: Every day | NASAL | 6 refills | Status: AC

## 2023-08-12 NOTE — Telephone Encounter (Signed)
 Chart reviewed. Rx refilled.

## 2023-09-05 ENCOUNTER — Ambulatory Visit (INDEPENDENT_AMBULATORY_CARE_PROVIDER_SITE_OTHER): Admitting: Family Medicine

## 2023-09-05 VITALS — BP 152/85 | HR 76 | Ht 62.0 in | Wt 189.0 lb

## 2023-09-05 DIAGNOSIS — I10 Essential (primary) hypertension: Secondary | ICD-10-CM | POA: Diagnosis not present

## 2023-09-05 DIAGNOSIS — E113299 Type 2 diabetes mellitus with mild nonproliferative diabetic retinopathy without macular edema, unspecified eye: Secondary | ICD-10-CM | POA: Diagnosis not present

## 2023-09-05 DIAGNOSIS — Z23 Encounter for immunization: Secondary | ICD-10-CM | POA: Diagnosis not present

## 2023-09-05 LAB — POCT GLYCOSYLATED HEMOGLOBIN (HGB A1C): HbA1c, POC (controlled diabetic range): 6.8 % (ref 0.0–7.0)

## 2023-09-05 MED ORDER — AMLODIPINE BESYLATE 5 MG PO TABS: 5.0000 mg | ORAL_TABLET | Freq: Every day | ORAL | 3 refills | Status: AC

## 2023-09-05 MED ORDER — SHINGRIX 50 MCG/0.5ML IM SUSR: INTRAMUSCULAR | 1 refills | Status: AC

## 2023-09-05 NOTE — Assessment & Plan Note (Signed)
 Uncontrolled On losartan -HCTZ 100-25 Start amlodipine 5 mg daily Follow-up in 2 to 6 weeks Consider switching to triple combo pill if well-controlled Advised to try and keep BP log at home if possible Discussed precautions regarding hypotension

## 2023-09-05 NOTE — Assessment & Plan Note (Signed)
 A1c 6.8 today, well-controlled for her age Reviewed diet and lifestyle/exercise Continue diet control, routine follow-up

## 2023-09-05 NOTE — Progress Notes (Signed)
    SUBJECTIVE:   CHIEF COMPLAINT / HPI:   T2DM -Current medication regimen: none -Home CBGs: low 100s -Denies polyuria, polydipsia, abdominal pain, chest pain, shortness of breath -Foot exam: UTD -Eye exam: Reports done last month  Lab Results  Component Value Date   HGBA1C 6.8 09/05/2023   HGBA1C 5.7 01/06/2023   HGBA1C 5.9 09/16/2022    HTN On Hyzaar 100-25 daily Unable to check BP at home Denies HA, vision changes, CP/SOB Denies recent falls, dizziness, lightheadedness  PERTINENT  PMH / PSH: T2DM, HLD  OBJECTIVE:   BP (!) 152/85   Pulse 76   Ht 5' 2 (1.575 m)   Wt 189 lb (85.7 kg)   SpO2 98%   BMI 34.57 kg/m    General: NAD, pleasant, able to participate in exam CV: RRR no murmur appreciated Respiratory: No respiratory distress, CTAB Skin: warm and dry, no rashes noted Psych: Normal affect and mood  Feet: No notable skin breakdown or wounds.  Sensation intact to sharp and dull sensation in multiple locations on the toes, dorsum of foot, sole, and above the ankle.  Proprioception intact.  Able to bear weight.  ASSESSMENT/PLAN:   Assessment & Plan DM type 2 with diabetic background retinopathy (HCC) A1c 6.8 today, well-controlled for her age Reviewed diet and lifestyle/exercise Continue diet control, routine follow-up Need for shingles vaccine Provided Rx Essential hypertension, benign Uncontrolled On losartan -HCTZ 100-25 Start amlodipine 5 mg daily Follow-up in 2 to 6 weeks Consider switching to triple combo pill if well-controlled Advised to try and keep BP log at home if possible Discussed precautions regarding hypotension   Payton Coward, MD [facility]

## 2023-09-05 NOTE — Patient Instructions (Addendum)
 Start amlodipine 5 mg daily   Try to get a BP cuff and keep a log of your daily blood pressures (Omron is the brand I recommend)  Continue your other current medications  Be sure to get regular eye exams   Take the prescription to your pharmacy to get a shingles vaccine   Blood Pressure Record Sheet To take your blood pressure, you will need a blood pressure machine. You can buy a blood pressure machine (blood pressure monitor) at your clinic, drug store, or online. When choosing one, consider: An automatic monitor that has an arm cuff. A cuff that wraps snugly around your upper arm. You should be able to fit only one finger between your arm and the cuff. A device that stores blood pressure reading results. Do not choose a monitor that measures your blood pressure from your wrist or finger. Follow your health care provider's instructions for how to take your blood pressure. To use this form: Take your blood pressure medications every day These measurements should be taken when you have been at rest for at least 10-15 min Take at least 2 readings with each blood pressure check. This makes sure the results are correct. Wait 1-2 minutes between measurements. Write down the results in the spaces on this form. Keep in mind it should always be recorded systolic over diastolic. Both numbers are important.  Repeat this every day for 2-3 weeks, or as told by your health care provider.  Make a follow-up appointment with your health care provider to discuss the results.  Blood Pressure Log Date Medications taken? (Y/N) Blood Pressure Time of Day

## 2023-09-07 ENCOUNTER — Ambulatory Visit (HOSPITAL_COMMUNITY): Payer: Self-pay | Admitting: Family Medicine

## 2023-09-07 LAB — MICROALBUMIN / CREATININE URINE RATIO
Creatinine, Urine: 95.1 mg/dL
Microalb/Creat Ratio: 4 mg/g{creat} (ref 0–29)
Microalbumin, Urine: 3.7 ug/mL

## 2023-09-10 ENCOUNTER — Encounter: Payer: Self-pay | Admitting: Family Medicine

## 2023-09-10 ENCOUNTER — Telehealth: Payer: Self-pay | Admitting: Student

## 2023-09-10 NOTE — Telephone Encounter (Signed)
**  After Hours/ Emergency Line Call**  Received a page to call [phone]) - [phone].  Patient: Amber [name]  Caller: Self  Confirmed name & DOB of patient with caller  Subjective:  Burning pain in the right shoulder, from neck to breast. Started this morning (8/6). Daughter does not see rash. No fevers, no NVD. No trouble breathing. Had GERD symptoms that resolved now. No SOB. No history of shingles.  Objective:  Observations: NAD,   Assessment & Plan  Amber [name] is a 83 y.o. female with PMHx s/f osteoarthritis, chronic left shoulder pain, paresthesias, type diabetes who calls with the following complaints and concerns: Burning pain from the right shoulder from neck to breast. Differential is broad and includes: Shingles, radiculopathy.  Lower concern for ACS but cannot complete rule out over the phone.   Recommendations:  Appointment scheduled for tomorrow and our access to care Strict ED precautions  -- Will forward to PCP.  Gladis Church, DO [facility], PGY-3

## 2023-09-11 ENCOUNTER — Ambulatory Visit (INDEPENDENT_AMBULATORY_CARE_PROVIDER_SITE_OTHER): Admitting: Family Medicine

## 2023-09-11 VITALS — BP 136/64 | HR 64 | Wt 185.0 lb

## 2023-09-11 DIAGNOSIS — M25511 Pain in right shoulder: Secondary | ICD-10-CM

## 2023-09-11 NOTE — Patient Instructions (Addendum)
 Thank you for coming in today! Here is a summary of what we discussed:  - Lets just keep an eye on your shoulder pain for now.  You can continue taking Tylenol  as needed. You could also use Voltaren  ointment to help with pain, this is over the counter.  If the pain continues or worsens or you develop new symptoms, please let us  know and we can see you again.  If you develop a very painful rash that would be reason to come into our office or go to the emergency room if it is unbearable.   Please call the clinic at [phone] if your symptoms worsen or you have any concerns.  Best, Dr Adele

## 2023-09-11 NOTE — Progress Notes (Signed)
    SUBJECTIVE:   CHIEF COMPLAINT / HPI:   Burning pain from right shoulder/neck to breast -- Called after-hours line yesterday, stated she had burning pain that started in the morning.  Denied fevers, nausea, vomiting, diarrhea, dyspnea.  Had GERD symptoms at the time that had resolved.  No history of shingles --burning, pulling sensation --no injuries, falls or trauma --no rashes --no fevers --neosporin and Tylenol  500mg  (helped)  PERTINENT  PMH / PSH: HTN, GERD, DM2, HLD, OA, history of CLL (diagnosed 2017-2018)  OBJECTIVE:   BP 136/64   Pulse 64   Wt 185 lb (83.9 kg)   SpO2 98%   BMI 33.84 kg/m   General: Awake and conversant, no acute distress Respiratory: Normal work of breathing on room air, speaking in full sentences Skin: No lesions or rash on back or right arm MSK: Normal ROM to BUE, nontender to palpation of right posterior shoulder or arm Psych: Appropriate mood and affect  ASSESSMENT/PLAN:   Assessment & Plan Acute pain of right shoulder Burning/pulling sensation to right posterior shoulder area that started 1 day ago.  No signs of rash or other abnormality on exam.  Possible muscle strain versus minor nerve inflammation.  Pain severity does not sound consistent with prodrome of shingles.  Advised symptomatic management with Tylenol , Voltaren .  Reviewed return precautions.  Also advised shingles vaccination.     Rea Raring, MD [facility]

## 2023-09-24 ENCOUNTER — Ambulatory Visit (INDEPENDENT_AMBULATORY_CARE_PROVIDER_SITE_OTHER): Admitting: Family Medicine

## 2023-09-24 VITALS — BP 120/66 | HR 74 | Ht 62.0 in | Wt 186.6 lb

## 2023-09-24 DIAGNOSIS — M25511 Pain in right shoulder: Secondary | ICD-10-CM

## 2023-09-24 NOTE — Patient Instructions (Signed)
 Thank you for visiting clinic today and allowing us  to participate in your care!  So glad you're feeling better! If your shoulder starts bothering you again, please try using a lidocaine  patch over the area for pain relief.   Please go to your local pharmacy to get your Shingles vaccine.   Please schedule an appointment in 3 months or sooner as needed.   Reach out any time with any questions or concerns you may have - we are here for you!  Damien Cassis, MD [facility] [phone]

## 2023-09-24 NOTE — Progress Notes (Signed)
    SUBJECTIVE:   CHIEF COMPLAINT / HPI:   Shoulder pain  -Following up on shoulder pain today, was seen in clinic 2 weeks prior for burning/pulling sensation of R posterior shoulder area -Symptoms now resolved, reports using neosporin and hydrocortisone cream -Reports normal ROM   OBJECTIVE:   BP 120/66   Pulse 74   Ht 5' 2 (1.575 m)   Wt 186 lb 9.6 oz (84.6 kg)   SpO2 97%   BMI 34.13 kg/m   General: Well-appearing. Resting comfortably in room. CV: Normal S1/S2. No extra heart sounds. Warm and well-perfused. Pulm: Breathing comfortably on room air. CTAB. No increased WOB. Skin:  Warm, dry. No rash or lesion of posterior shoulder.  Ext: Normal ROM of bilateral shoulders. R shoulder nontender to palpation.  Psych: Pleasant and appropriate.    ASSESSMENT/PLAN:   Assessment & Plan Acute pain of right shoulder Now resolved. Normal exam findings today.  - Discussed lidocaine  patches as needed if pain returns - Discussed obtaining shingles vaccine at local pharmacy    RTC in 3 months for chronic disease management or sooner as needed.   Damien Cassis, MD [facility]

## 2023-09-29 ENCOUNTER — Ambulatory Visit (INDEPENDENT_AMBULATORY_CARE_PROVIDER_SITE_OTHER)

## 2023-09-29 VITALS — Ht 62.0 in | Wt 189.0 lb

## 2023-09-29 DIAGNOSIS — Z0000 Encounter for general adult medical examination without abnormal findings: Secondary | ICD-10-CM

## 2023-09-29 NOTE — Progress Notes (Signed)
 Because this visit was a virtual/telehealth visit,  certain criteria was not obtained, such a blood pressure, CBG if applicable, and timed get up and go. Any medications not marked as taking were not mentioned during the medication reconciliation part of the visit. Any vitals not documented were not able to be obtained due to this being a telehealth visit or patient was unable to self-report a recent blood pressure reading due to a lack of equipment at home via telehealth. Vitals that have been documented are verbally provided by the patient.   Subjective:   Amber [name] is a 83 y.o. who presents for a Medicare Wellness preventive visit.  As a reminder, Annual Wellness Visits don't include a physical exam, and some assessments may be limited, especially if this visit is performed virtually. We may recommend an in-person follow-up visit with your provider if needed.  Visit Complete: Virtual I connected with  Amber [name] on 09/29/23 by a audio enabled telemedicine application and verified that I am speaking with the correct person using two identifiers.  Patient Location: Home  Provider Location: Home Office  I discussed the limitations of evaluation and management by telemedicine. The patient expressed understanding and agreed to proceed.  Vital Signs: Because this visit was a virtual/telehealth visit, some criteria may be missing or patient reported. Any vitals not documented were not able to be obtained and vitals that have been documented are patient reported.  VideoDeclined- This patient declined Librarian, academic. Therefore the visit was completed with audio only.  Persons Participating in Visit: Patient.  AWV Questionnaire: No: Patient Medicare AWV questionnaire was not completed prior to this visit.  Cardiac Risk Factors include: advanced age (>55men, >72 women);hypertension;family history of premature cardiovascular disease;obesity  (BMI >30kg/m2);sedentary lifestyle     Objective:    Today's Vitals   09/29/23 1625  Weight: 189 lb (85.7 kg)  Height: 5' 2 (1.575 m)  PainSc: 0-No pain   Body mass index is 34.57 kg/m.     09/29/2023    4:37 PM 09/11/2023    1:50 PM 09/05/2023    2:23 PM 07/10/2023   10:09 AM 07/01/2023    3:31 PM 06/03/2023    4:17 PM 03/13/2023    9:06 AM  Advanced Directives  Does Patient Have a Medical Advance Directive? No No No No No Yes No  Type of Careers adviser;Living will   Would patient like information on creating a medical advance directive? No - Patient declined No - Patient declined No - Patient declined  No - Patient declined  No - Patient declined    Current Medications (verified) Outpatient Encounter Medications as of 09/29/2023  Medication Sig   acetaminophen  (TYLENOL ) 500 MG tablet Take 1 tablet (500 mg total) by mouth every 6 (six) hours as needed.   amLODipine  (NORVASC ) 5 MG tablet Take 1 tablet (5 mg total) by mouth at bedtime.   EQ ASPIRIN  ADULT LOW DOSE 81 MG EC tablet Take 1 tablet by mouth once daily   fluticasone  (FLONASE ) 50 MCG/ACT nasal spray Place 2 sprays into both nostrils daily.   isosorbide  mononitrate (IMDUR ) 30 MG 24 hr tablet Take 1 tablet (30 mg total) by mouth daily. (Patient not taking: Reported on 07/01/2023)   loratadine  (CLARITIN ) 10 MG tablet Take 1 tablet (10 mg total) by mouth daily. (Patient not taking: Reported on 07/01/2023)   losartan -hydrochlorothiazide  (HYZAAR) 100-25 MG tablet TAKE 1 TABLET BY MOUTH  DAILY.   meclizine  (ANTIVERT ) 25 MG tablet Take 1 tablet (25 mg total) by mouth 3 (three) times daily as needed for dizziness.   Multiple Vitamins-Minerals (WOMENS ONE DAILY) TABS Take 1 tablet by mouth daily.   Olopatadine  HCl 0.2 % SOLN Apply 1 drop to eye daily. (Patient not taking: Reported on 01/06/2023)   oxymetazoline  (AFRIN NASAL SPRAY) 0.05 % nasal spray Place 1 spray into both nostrils 2 (two) times daily. Do  not take for more than 3 days in a row. (Patient not taking: Reported on 07/01/2023)   pantoprazole  (PROTONIX ) 40 MG tablet Take 1 tablet (40 mg total) by mouth daily.   polyethylene glycol powder (GLYCOLAX /MIRALAX ) 17 GM/SCOOP powder Take 17 g by mouth 2 (two) times daily as needed. (Patient not taking: Reported on 07/01/2023)   pravastatin  (PRAVACHOL ) 40 MG tablet Take 40 mg by mouth daily.   sucralfate  (CARAFATE ) 1 GM/10ML suspension Take 10 mLs (1 g total) by mouth 3 (three) times daily with meals.   timolol  (BETIMOL ) 0.5 % ophthalmic solution Place 1 drop into the right eye 2 (two) times daily.   Zoster Vaccine Adjuvanted (SHINGRIX ) injection Administer Shingrix  vaccination now and repeat in two months   No facility-administered encounter medications on file as of 09/29/2023.    Allergies (verified) Amoxicillin, Sulfamethoxazole, Sulfonamide derivatives, Codeine, and Penicillins   History: Past Medical History:  Diagnosis Date   Anemia    Arthritis    Back pain    Blood transfusion without reported diagnosis    CLL (chronic lymphocytic leukemia) (HCC) 09/21/2015   Diabetes mellitus without complication (HCC)    Dizziness 12/27/2014   H/O echocardiogram    a. 2015: echo showing a preserved EF of 60-65%, Grade 1 DD and mild MR.   History of cardiac cath    a. normal cors by cath in 03/2010   Hyperlipidemia    Hypertension    Palpitations    Past Surgical History:  Procedure Laterality Date   ABDOMINAL HYSTERECTOMY     COLON SURGERY     Family History  Problem Relation Age of Onset   Pancreatic cancer Sister        3 sisters died of it   Heart attack Father 82       Massive MI   Diabetes Mother    Cancer Mother    Hypertension Son    Congestive Heart Failure Son    Diabetes Son    Hypertension Daughter    Social History   Socioeconomic History   Marital status: Widowed    Spouse name: Not on file   Number of children: 5   Years of education: Not on file    Highest education level: Not on file  Occupational History   Not on file  Tobacco Use   Smoking status: Never    Passive exposure: Never   Smokeless tobacco: Former    Quit date: 02/05/1983  Vaping Use   Vaping status: Never Used  Substance and Sexual Activity   Alcohol use: No   Drug use: No   Sexual activity: Never  Other Topics Concern   Not on file  Social History Narrative   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  5 children, lives alone.   Pt exercises by walking every other day.         Social Drivers of Health   Financial Resource Strain: Low Risk  (09/29/2023)   Overall  Financial Resource Strain (CARDIA)    Difficulty of Paying Living Expenses: Not hard at all  Food Insecurity: No Food Insecurity (09/29/2023)   Hunger Vital Sign    Worried About Running Out of Food in the Last Year: Never true    Ran Out of Food in the Last Year: Never true  Transportation Needs: No Transportation Needs (09/29/2023)   PRAPARE - Administrator, Civil Service (Medical): No    Lack of Transportation (Non-Medical): No  Physical Activity: Sufficiently Active (09/29/2023)   Exercise Vital Sign    Days of Exercise per Week: 5 days    Minutes of Exercise per Session: 30 min  Stress: No Stress Concern Present (09/29/2023)   [employer] of Occupational Health - Occupational Stress Questionnaire    Feeling of Stress: Not at all  Social Connections: Moderately Isolated (09/29/2023)   Social Connection and Isolation Panel    Frequency of Communication with Friends and Family: More than three times a week    Frequency of Social Gatherings with Friends and Family: Three times a week    Attends Religious Services: More than 4 times per year    Active Member of Clubs or Organizations: No    Attends Banker Meetings: Never    Marital Status: Widowed    Tobacco Counseling Counseling given: Not  Answered    Clinical Intake:  Pre-visit preparation completed: Yes  Pain : No/denies pain Pain Score: 0-No pain     BMI - recorded: 34.57 Nutritional Status: BMI > 30  Obese Nutritional Risks: None Diabetes: No  Lab Results  Component Value Date   HGBA1C 6.8 09/05/2023   HGBA1C 5.7 01/06/2023   HGBA1C 5.9 09/16/2022     How often do you need to have someone help you when you read instructions, pamphlets, or other written materials from your doctor or pharmacy?: 1 - Never  Interpreter Needed?: No  Information entered by :: Gracee Ratterree N. Eulises Kijowski, LPN.   Activities of Daily Living     09/29/2023    4:28 PM  In your present state of health, do you have any difficulty performing the following activities:  Hearing? 0  Vision? 0  Difficulty concentrating or making decisions? 0  Comment BSE: READ BIBLE  Walking or climbing stairs? 0  Dressing or bathing? 0  Doing errands, shopping? 0  Preparing Food and eating ? N  Using the Toilet? N  In the past six months, have you accidently leaked urine? N  Do you have problems with loss of bowel control? N  Managing your Medications? N  Managing your Finances? N  Housekeeping or managing your Housekeeping? N    Patient Care Team: Diona Perkins, MD as PCP - General (Family Medicine) Maree Paticia BRAVO, MD as Referring Physician (Ophthalmology) Leila Bound, OD (Optometry)  I have updated your Care Teams any recent Medical Services you may have received from other providers in the past year.     Assessment:   This is a routine wellness examination for Amber [name].  Hearing/Vision screen Hearing Screening - Comments:: Patient has adequate hearing.  No hearing aids. Vision Screening - Comments:: Wears rx glasses - up to date with routine eye exams with Dr. Paticia Maree with [facility] [address]    Goals Addressed             This Visit's Progress    09/29/2023: To maintain my health, stay independent  and active around my  home.  Depression Screen     09/29/2023    4:29 PM 09/24/2023   10:00 AM 09/11/2023    1:50 PM 07/01/2023    3:31 PM 06/03/2023    3:09 PM 03/13/2023    9:14 AM 02/27/2023    3:50 PM  PHQ 2/9 Scores  PHQ - 2 Score 0 0 0      PHQ- 9 Score 0 0 0      Exception Documentation    Patient refusal Patient refusal Patient refusal Patient refusal    Fall Risk     09/29/2023    4:28 PM 09/24/2023    9:49 AM 09/11/2023    1:51 PM 07/01/2023    3:30 PM 06/03/2023    3:09 PM  Fall Risk   Falls in the past year? 0 0 0 0 0  Number falls in past yr: 0  0 0   Injury with Fall? 0  0 0   Risk for fall due to : No Fall Risks  No Fall Risks    Follow up Falls evaluation completed  Falls prevention discussed      MEDICARE RISK AT HOME:  Medicare Risk at Home Any stairs in or around the home?: No If so, are there any without handrails?: No Home free of loose throw rugs in walkways, pet beds, electrical cords, etc?: Yes Adequate lighting in your home to reduce risk of falls?: Yes Life alert?: No Use of a cane, walker or w/c?: No Grab bars in the bathroom?: Yes (TUB AND TOILET) Shower chair or bench in shower?: No Elevated toilet seat or a handicapped toilet?: No  TIMED UP AND GO:  Was the test performed?  No  Cognitive Function: Declined/Normal: No cognitive concerns noted by patient or family. Patient alert, oriented, able to answer questions appropriately and recall recent events. No signs of memory loss or confusion.    09/29/2023    4:29 PM  MMSE - Mini Mental State Exam  Not completed: Unable to complete        09/29/2023    4:26 PM 07/12/2022   11:04 AM  6CIT Screen  What Year? 0 points 0 points  What month? 0 points 0 points  What time? 0 points 0 points  Count back from 20 0 points 0 points  Months in reverse 0 points 0 points  Repeat phrase 0 points 0 points  Total Score 0 points 0 points    Immunizations Immunization History  Administered Date(s) Administered   Fluad  Quad(high Dose 65+) 12/02/2019, 03/14/2021   Influenza Split 12/21/2010, 12/11/2011   Influenza Whole 01/09/2007, 02/11/2008, 11/29/2008, 12/14/2009   Influenza,inj,Quad PF,6+ Mos 04/12/2013, 11/08/2013, 12/27/2014, 11/28/2015, 01/10/2017, 01/29/2018, 11/13/2018   PFIZER(Purple Top)SARS-COV-2 Vaccination 05/20/2019, 06/14/2019, 01/11/2020   Pneumococcal Conjugate-13 11/08/2013   Pneumococcal Polysaccharide-23 07/06/1999, 01/22/2007   Td 04/04/2001   Tdap 06/05/2011    Screening Tests Health Maintenance  Topic Date Due   Zoster Vaccines- Shingrix  (1 of 2) Never done   DTaP/Tdap/Td (3 - Td or Tdap) 06/04/2021   COVID-19 Vaccine (4 - 2024-25 season) 10/06/2022   OPHTHALMOLOGY EXAM  04/03/2023   INFLUENZA VACCINE  09/05/2023   HEMOGLOBIN A1C  03/07/2024   Diabetic kidney evaluation - eGFR measurement  07/09/2024   Diabetic kidney evaluation - Urine ACR  09/04/2024   FOOT EXAM  09/04/2024   Medicare Annual Wellness (AWV)  09/28/2024   Pneumococcal Vaccine: 50+ Years  Completed   DEXA SCAN  Completed   HPV VACCINES  Aged Out  Meningococcal B Vaccine  Aged Out    Health Maintenance  Health Maintenance Due  Topic Date Due   Zoster Vaccines- Shingrix  (1 of 2) Never done   DTaP/Tdap/Td (3 - Td or Tdap) 06/04/2021   COVID-19 Vaccine (4 - 2024-25 season) 10/06/2022   OPHTHALMOLOGY EXAM  04/03/2023   INFLUENZA VACCINE  09/05/2023   Health Maintenance Items Addressed: Yes Patient aware of current care gaps.  Additional Screening:  Vision Screening: Recommended annual ophthalmology exams for early detection of glaucoma and other disorders of the eye. Would you like a referral to an eye doctor? No    Dental Screening: Recommended annual dental exams for proper oral hygiene  Community Resource Referral / Chronic Care Management: CRR required this visit?  No   CCM required this visit?  No   Plan:    I have personally reviewed and noted the following in the patient's chart:    Medical and social history Use of alcohol, tobacco or illicit drugs  Current medications and supplements including opioid prescriptions. Patient is not currently taking opioid prescriptions. Functional ability and status Nutritional status Physical activity Advanced directives List of other physicians Hospitalizations, surgeries, and ER visits in previous 12 months Vitals Screenings to include cognitive, depression, and falls Referrals and appointments  In addition, I have reviewed and discussed with patient certain preventive protocols, quality metrics, and best practice recommendations. A written personalized care plan for preventive services as well as general preventive health recommendations were provided to patient.   Roz LOISE Fuller, LPN   [id number]   After Visit Summary: (Declined) Due to this being a telephonic visit, with patients personalized plan was offered to patient but patient Declined AVS at this time   Notes: Nothing significant to report at this time.

## 2023-09-29 NOTE — Patient Instructions (Signed)
 Amber [name] , Thank you for taking time out of your busy schedule to complete your Annual Wellness Visit with me. I enjoyed our conversation and look forward to speaking with you again next year. I, as well as your care team,  appreciate your ongoing commitment to your health goals. Please review the following plan we discussed and let me know if I can assist you in the future. Your Game plan/ To Do List    Referrals: If you haven't heard from the office you've been referred to, please reach out to them at the phone provided.   Follow up Visits: We will see or speak with you next year for your Next Medicare AWV with our clinical staff Have you seen your provider in the last 6 months (3 months if uncontrolled diabetes)? Yes  Clinician Recommendations:  Aim for 30 minutes of exercise or brisk walking, 6-8 glasses of water, and 5 servings of fruits and vegetables each day.       This is a list of the screenings recommended for you:  Health Maintenance  Topic Date Due   Zoster (Shingles) Vaccine (1 of 2) Never done   DTaP/Tdap/Td vaccine (3 - Td or Tdap) 06/04/2021   COVID-19 Vaccine (4 - 2024-25 season) 10/06/2022   Eye exam for diabetics  04/03/2023   Flu Shot  09/05/2023   Hemoglobin A1C  03/07/2024   Yearly kidney function blood test for diabetes  07/09/2024   Yearly kidney health urinalysis for diabetes  09/04/2024   Complete foot exam   09/04/2024   Medicare Annual Wellness Visit  09/28/2024   Pneumococcal Vaccine for age over 48  Completed   DEXA scan (bone density measurement)  Completed   HPV Vaccine  Aged Out   Meningitis B Vaccine  Aged Out    Advanced directives: (Declined) Advance directive discussed with you today. Even though you declined this today, please call our office should you change your mind, and we can give you the proper paperwork for you to fill out. Advance Care Planning is important because it:  [x]  Makes sure you receive the medical care that is consistent  with your values, goals, and preferences  [x]  It provides guidance to your family and loved ones and reduces their decisional burden about whether or not they are making the right decisions based on your wishes.  Follow the link provided in your after visit summary or read over the paperwork we have mailed to you to help you started getting your Advance Directives in place. If you need assistance in completing these, please reach out to us  so that we can help you!  See attachments for Preventive Care and Fall Prevention Tips.

## 2023-11-04 ENCOUNTER — Ambulatory Visit: Admitting: Family Medicine

## 2023-11-04 ENCOUNTER — Encounter: Payer: Self-pay | Admitting: Family Medicine

## 2023-11-04 VITALS — BP 132/70 | HR 76 | Ht 62.0 in | Wt 187.0 lb

## 2023-11-04 DIAGNOSIS — R200 Anesthesia of skin: Secondary | ICD-10-CM

## 2023-11-04 DIAGNOSIS — R6889 Other general symptoms and signs: Secondary | ICD-10-CM

## 2023-11-04 DIAGNOSIS — R350 Frequency of micturition: Secondary | ICD-10-CM | POA: Diagnosis not present

## 2023-11-04 DIAGNOSIS — M79604 Pain in right leg: Secondary | ICD-10-CM | POA: Diagnosis not present

## 2023-11-04 DIAGNOSIS — R202 Paresthesia of skin: Secondary | ICD-10-CM

## 2023-11-04 DIAGNOSIS — M79605 Pain in left leg: Secondary | ICD-10-CM

## 2023-11-04 LAB — POCT URINALYSIS DIP (MANUAL ENTRY)
Bilirubin, UA: NEGATIVE
Blood, UA: NEGATIVE
Glucose, UA: NEGATIVE mg/dL
Ketones, POC UA: NEGATIVE mg/dL
Leukocytes, UA: NEGATIVE
Nitrite, UA: NEGATIVE
Protein Ur, POC: NEGATIVE mg/dL
Spec Grav, UA: <=1.005 — AB (ref 1.010–1.025)
Urobilinogen, UA: 0.2 U/dL
pH, UA: 5.5 (ref 5.0–8.0)

## 2023-11-04 LAB — POC SOFIA 2 FLU + SARS ANTIGEN FIA
Influenza A, POC: NEGATIVE
Influenza B, POC: NEGATIVE
SARS Coronavirus 2 Ag: NEGATIVE

## 2023-11-04 MED ORDER — DICLOFENAC SODIUM 1 % EX GEL: 2.0000 g | Freq: Four times a day (QID) | CUTANEOUS | 1 refills | Status: AC | PRN

## 2023-11-04 NOTE — Patient Instructions (Addendum)
 Thank you for visiting clinic today and allowing us  to participate in your care!  Sorry you have not been feeling well! You tested negative for flu and covid today. Your urine test looked okay as well.   You may have a cold. Please try get plenty of rest and stay well-hydrated.   For your knees, try using voltaren  gel or lidocaine  patches to help with pain and inflammation.   Plan to see your ophthalmologist and dentist soon.   Please schedule an appointment in 3-4 weeks for follow up. If your symptoms are not improving over the next few days, please let us  know. Hope you're feeling better soon!  Reach out any time with any questions or concerns you may have - we are here for you!  Damien Cassis, MD [facility] [phone]

## 2023-11-04 NOTE — Progress Notes (Signed)
    SUBJECTIVE:   CHIEF COMPLAINT / HPI:   Knee pain Cold symptoms   Increased urinary frequency Since Saturday, patient has noticed that her legs feel intermittently numb/tingly, painful around her knees.  Not numb or tingly right now.  No recent falls or trauma.  In the past few days, she has also had a headache and some cough productive of white phlegm.  No fevers, chills, congestion, N/V/D.  Her appetite has been reduced some.  Has felt dizzy at times, no dizziness right now.  She has noticed increased urinary frequency and burning, no hematuria noted.  Feels like she cannot control her urine at times.  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 Resting comfortably in room. CV: Normal S1/S2. No extra heart sounds. Warm and well-perfused. Pulm: Breathing comfortably on room air. CTAB. No increased WOB. Abd: Soft, non-tender, non-distended. Skin:  Warm, dry. Psych: Pleasant and appropriate.  MSK: Bilateral lower extremities with normal ROM bilaterally, no significant swelling or deformity, nontender to palpation, ambulates independently. Neuro: Alert and interactive.  Normal strength and sensation of upper and lower extremities.  No saddle paresthesia.  Ambulates independently. CN3,4,6: L pupil reactive to light.  Known legal blindness in right eye. EOM grossly intact.  CN5: Sensation intact BL CN7: Facial expressions symmetric CN8: Hearing intact BL CN9: palate symmetric  CN10: regular speech CN11: turns head against resistance CN12: tongue midline   ASSESSMENT/PLAN:   Assessment & Plan Pain in both lower extremities Numbness and tingling of both lower extremities Reassuring neuro and leg exam today.  No recent falls or trauma.  Suspect possible component of  osteoarthritis given history, age, weight.  Suspect intermittent numbness and tingling may be secondary to known lumbar stenosis.  -Voltaren  gel, lidocaine  patch for pain relief -CTM Increased urinary frequency Unclear etiology at this time.  Recent A1c at goal last month.  UA today noninfectious.  May be secondary to viral illness. Will continue to monitor.  If symptoms persist, may consider urinary incontinence. Flu-like symptoms Tested negative for flu and COVID today.  Patient afebrile with reassuring cardio/pulm exam today without focal findings.  Suspect possible viral component contributing to symptoms. -Discussed importance of hydration, supportive care at home   RTC in 3-4 weeks.  Damien Cassis, MD [facility]

## 2023-11-28 ENCOUNTER — Other Ambulatory Visit: Payer: Self-pay | Admitting: Family Medicine

## 2023-11-28 DIAGNOSIS — R079 Chest pain, unspecified: Secondary | ICD-10-CM

## 2023-12-01 NOTE — Telephone Encounter (Signed)
 Chart reviewed. Rx refilled.

## 2024-02-08 ENCOUNTER — Other Ambulatory Visit: Payer: Self-pay

## 2024-02-08 ENCOUNTER — Encounter (HOSPITAL_COMMUNITY): Payer: Self-pay

## 2024-02-08 ENCOUNTER — Emergency Department (HOSPITAL_COMMUNITY)

## 2024-02-08 ENCOUNTER — Emergency Department (HOSPITAL_COMMUNITY): Admission: EM | Admit: 2024-02-08 | Discharge: 2024-02-08 | Disposition: A | Discharge status: Home / Self Care

## 2024-02-08 DIAGNOSIS — Z79899 Other long term (current) drug therapy: Secondary | ICD-10-CM | POA: Diagnosis not present

## 2024-02-08 DIAGNOSIS — E119 Type 2 diabetes mellitus without complications: Secondary | ICD-10-CM | POA: Insufficient documentation

## 2024-02-08 DIAGNOSIS — I10 Essential (primary) hypertension: Secondary | ICD-10-CM | POA: Insufficient documentation

## 2024-02-08 DIAGNOSIS — R059 Cough, unspecified: Secondary | ICD-10-CM | POA: Diagnosis present

## 2024-02-08 DIAGNOSIS — J329 Chronic sinusitis, unspecified: Secondary | ICD-10-CM | POA: Diagnosis not present

## 2024-02-08 DIAGNOSIS — R519 Headache, unspecified: Secondary | ICD-10-CM | POA: Diagnosis present

## 2024-02-08 LAB — BASIC METABOLIC PANEL WITH GFR
Anion gap: 10 (ref 5–15)
BUN: 23 mg/dL (ref 8–23)
CO2: 27 mmol/L (ref 22–32)
Calcium: 9.5 mg/dL (ref 8.9–10.3)
Chloride: 100 mmol/L (ref 98–111)
Creatinine, Ser: 1.15 mg/dL — ABNORMAL HIGH (ref 0.44–1.00)
GFR, Estimated: 47 mL/min — ABNORMAL LOW
Glucose, Bld: 163 mg/dL — ABNORMAL HIGH (ref 70–99)
Potassium: 3.7 mmol/L (ref 3.5–5.1)
Sodium: 137 mmol/L (ref 135–145)

## 2024-02-08 LAB — RESP PANEL BY RT-PCR (RSV, FLU A&B, COVID)  RVPGX2
Influenza A by PCR: NEGATIVE
Influenza B by PCR: NEGATIVE
Resp Syncytial Virus by PCR: NEGATIVE
SARS Coronavirus 2 by RT PCR: NEGATIVE

## 2024-02-08 LAB — CBC
HCT: 36.1 % (ref 36.0–46.0)
Hemoglobin: 11.1 g/dL — ABNORMAL LOW (ref 12.0–15.0)
MCH: 25.9 pg — ABNORMAL LOW (ref 26.0–34.0)
MCHC: 30.7 g/dL (ref 30.0–36.0)
MCV: 84.3 fL (ref 80.0–100.0)
Platelets: 273 K/uL (ref 150–400)
RBC: 4.28 MIL/uL (ref 3.87–5.11)
RDW: 14.5 % (ref 11.5–15.5)
WBC: 10.1 K/uL (ref 4.0–10.5)
nRBC: 0 % (ref 0.0–0.2)

## 2024-02-08 MED ORDER — DOXYCYCLINE HYCLATE 100 MG PO CAPS: 100.0000 mg | ORAL_CAPSULE | Freq: Two times a day (BID) | ORAL | 0 refills | Status: AC

## 2024-02-08 MED ORDER — DOXYCYCLINE HYCLATE 100 MG PO TABS
100.0000 mg | ORAL_TABLET | Freq: Once | ORAL | Status: AC
  Administered 2024-02-08: 100 mg via ORAL
  Filled 2024-02-08: qty 1

## 2024-02-08 MED ORDER — ACETAMINOPHEN 10 MG/ML IV SOLN
1000.0000 mg | Freq: Once | INTRAVENOUS | Status: AC
  Administered 2024-02-08: 1000 mg via INTRAVENOUS
  Filled 2024-02-08: qty 100

## 2024-02-08 NOTE — ED Provider Triage Note (Signed)
 Emergency Medicine Provider Triage Evaluation Note  Kashish Yglesias , a 84 y.o. female  was evaluated in triage.  Pt complains of dry cough, sore throat, headache, and chills at home for the past 4 days.  Reports nasal congestion as well.  Denies any shortness of breath.  No abdominal pain, nausea, vomiting, or diarrhea.  Review of Systems  Positive: As above Negative: As above  Physical Exam  BP (!) 154/64   Pulse 81   Temp 97.9 F (36.6 C) (Oral)   Resp 20   Ht 5' 2 (1.575 m)   Wt 81.6 kg   SpO2 97%   BMI 32.92 kg/m  Gen:   Awake, no distress   Resp:  Normal effort  MSK:   Moves extremities without difficulty  Other:  Sounds congested  Medical Decision Making  Medically screening exam initiated at 3:27 PM.  Appropriate orders placed.  Naomie Rosalynn Mumby was informed that the remainder of the evaluation will be completed by another provider, this initial triage assessment does not replace that evaluation, and the importance of remaining in the ED until their evaluation is complete.     Veta Palma, PA-C 02/08/24 1527

## 2024-02-08 NOTE — ED Triage Notes (Signed)
 Pt c/o HA, sore throat, dry coughx4d.

## 2024-02-08 NOTE — Discharge Instructions (Signed)
 I think you have a sinus infection.  Please take the doxycycline  twice daily.  Take Tylenol  every 6-8 hours as needed for headache.  Do not take more than 4000 mg of Tylenol  in 1 day.  Please follow-up with your doctor and return to the ER for worsening symptoms.

## 2024-02-08 NOTE — ED Notes (Signed)
 CCMD called by this RN

## 2024-02-08 NOTE — ED Provider Notes (Signed)
 " [facility] Provider Note   CSN: [id number] Arrival date & time: 02/08/24  1515     Patient presents with: Headache and Cough   Amber [name] is a 84 y.o. female.   84 year old female with past medical history of diabetes, hypertension, and hyperlipidemia as well as CML in remission presenting to the emergency department today with nasal congestion, headache, and cough.  Patient states she has been feeling unwell since Wednesday.  States that she has had a persistent nonproductive cough.  Denies any associated fevers.  States she is having a lot of nasal congestion and frontal sinus headache.  She denies any visual changes or pain with mastication or temporal pain.  She came to the ER today for further evaluation regarding this.   Headache Associated symptoms: cough   Cough Associated symptoms: headaches        Prior to Admission medications  Medication Sig Start Date End Date Taking? Authorizing Provider  doxycycline  (VIBRAMYCIN ) 100 MG capsule Take 1 capsule (100 mg total) by mouth 2 (two) times daily. 02/08/24  Yes Ula Prentice SAUNDERS, MD  acetaminophen  (TYLENOL ) 500 MG tablet Take 1 tablet (500 mg total) by mouth every 6 (six) hours as needed. 03/15/18   Fawze, Mina A, PA-C  amLODipine  (NORVASC ) 5 MG tablet Take 1 tablet (5 mg total) by mouth at bedtime. 09/05/23   Romelle Booty, MD  diclofenac  Sodium (VOLTAREN ) 1 % GEL Apply 2 g topically 4 (four) times daily as needed (pain). 11/04/23   Diona Perkins, MD  EQ ASPIRIN  ADULT LOW DOSE 81 MG EC tablet Take 1 tablet by mouth once daily 07/17/20   Cresenzo, John V, MD  fluticasone  (FLONASE ) 50 MCG/ACT nasal spray Place 2 sprays into both nostrils daily. 08/12/23   Diona Perkins, MD  isosorbide  mononitrate (IMDUR ) 30 MG 24 hr tablet TAKE 1 TABLET (30 MG TOTAL) BY MOUTH DAILY. 12/01/23   Diona Perkins, MD  loratadine  (CLARITIN ) 10 MG tablet Take 1 tablet (10 mg total) by mouth daily. Patient not taking:  Reported on 07/01/2023 10/18/22   Alba Sharper, MD  losartan -hydrochlorothiazide  (HYZAAR) 100-25 MG tablet TAKE 1 TABLET BY MOUTH DAILY. 07/03/23   Diona Perkins, MD  meclizine  (ANTIVERT ) 25 MG tablet Take 1 tablet (25 mg total) by mouth 3 (three) times daily as needed for dizziness. 06/04/23   Lenor Hollering, MD  Multiple Vitamins-Minerals (WOMENS ONE DAILY) TABS Take 1 tablet by mouth daily.    [provider]  Olopatadine  HCl 0.2 % SOLN Apply 1 drop to eye daily. Patient not taking: Reported on 01/06/2023 05/14/22   Espinoza, Alejandra, DO  oxymetazoline  (AFRIN NASAL SPRAY) 0.05 % nasal spray Place 1 spray into both nostrils 2 (two) times daily. Do not take for more than 3 days in a row. Patient not taking: Reported on 07/01/2023 10/18/22   Alba Sharper, MD  pantoprazole  (PROTONIX ) 40 MG tablet Take 1 tablet (40 mg total) by mouth daily. 07/10/23   Freddi Hamilton, MD  polyethylene glycol powder (GLYCOLAX /MIRALAX ) 17 GM/SCOOP powder Take 17 g by mouth 2 (two) times daily as needed. Patient not taking: Reported on 07/01/2023 01/11/20   Cresenzo, John V, MD  pravastatin  (PRAVACHOL ) 40 MG tablet Take 40 mg by mouth daily.    [provider]  sucralfate  (CARAFATE ) 1 GM/10ML suspension Take 10 mLs (1 g total) by mouth 3 (three) times daily with meals. 07/10/23   Freddi Hamilton, MD  timolol  (BETIMOL ) 0.5 % ophthalmic solution Place  1 drop into the right eye 2 (two) times daily. 04/12/13   Marquette Ozell BIRCH, DO  Zoster Vaccine Adjuvanted (SHINGRIX ) injection Administer Shingrix  vaccination now and repeat in two months 09/05/23   Romelle Booty, MD    Allergies: Amoxicillin, Sulfamethoxazole, Sulfonamide derivatives, Codeine, and Penicillins    Review of Systems  Respiratory:  Positive for cough.   Neurological:  Positive for headaches.  All other systems reviewed and are negative.   Updated Vital Signs BP (!) 131/58   Pulse 73   Temp 97.9 F (36.6 C) (Oral)   Resp 16   Ht 5' 2 (1.575  m)   Wt 81.6 kg   SpO2 98%   BMI 32.92 kg/m   Physical Exam Vitals and nursing note reviewed.   Gen: NAD Eyes: PERRL, EOMI HEENT: no oropharyngeal swelling, frontal sinus tenderness noted bilaterally Neck: trachea midline Resp: clear to auscultation bilaterally, diminished over left lung base Card: RRR, no murmurs, rubs, or gallops Abd: nontender, nondistended Extremities: no calf tenderness, no edema Vascular: 2+ radial pulses bilaterally, 2+ DP pulses bilaterally Neuro: Equal strength sensation throughout bilateral upper and lower extremities Skin: no rashes Psyc: acting appropriately   (all labs ordered are listed, but only abnormal results are displayed) Labs Reviewed  BASIC METABOLIC PANEL WITH GFR - Abnormal; Notable for the following components:      Result Value   Glucose, Bld 163 (*)    Creatinine, Ser 1.15 (*)    GFR, Estimated 47 (*)    All other components within normal limits  CBC - Abnormal; Notable for the following components:   Hemoglobin 11.1 (*)    MCH 25.9 (*)    All other components within normal limits  RESP PANEL BY RT-PCR (RSV, FLU A&B, COVID)  [id number]    EKG: EKG Interpretation Date/Time:  Sunday February 08 2024 17:41:15 EST Ventricular Rate:  78 PR Interval:  168 QRS Duration:  98 QT Interval:  392 QTC Calculation: 447 R Axis:   172  Text Interpretation: Sinus rhythm Right axis deviation Confirmed by Ula Barter [phone]) on 02/08/2024 5:53:32 PM  Radiology: [facility] Chest 2 View Result Date: 02/08/2024 CLINICAL DATA:  Cough EXAM: CHEST - 2 VIEW COMPARISON:  07/10/2023 FINDINGS: The heart size and mediastinal contours are within normal limits. Both lungs are clear. Degenerative changes of the spine. Aortic atherosclerosis IMPRESSION: No active cardiopulmonary disease. Electronically Signed   By: Luke Bun M.D.   On: 02/08/2024 16:13     Procedures   Medications Ordered in the ED  doxycycline  (VIBRA -TABS) tablet 100 mg (has no administration  in time range)  acetaminophen  (OFIRMEV ) IV 1,000 mg (0 mg Intravenous Stopped 02/08/24 1817)                                    Medical Decision Making 84 year old female with past medical history of diabetes, hypertension, and hyperlipidemia presenting to the emergency department today with cough and nasal congestion that is getting worse associated with headache.  This does sound more like a flulike illness but the patient's flu test from triage is negative.  CT scan of her head ordered at triage is also negative.  I will obtain basic labs here.  Will give her ofirmev  for her headache.  Given the cough and URI symptoms I suspect that this is likely a sinus headache.  As this does seem to be worsening is been lasting for 5  days we will treat the patient for bacterial sinusitis and for labs are reassuring.  Based on description of her symptoms and with the URI symptoms suspicion for temporal arteritis is low at this time.  Headache has been worsening gradually so suspicion for subarachnoid hemorrhage is also low at this time.  The patient's labs are reassuring.  She was feeling better after the ofirmev .  She is given a dose of doxycycline  here.  Will treat her with a course of antibiotics for sinusitis.  She is discharged with return precautions.  Amount and/or Complexity of Data Reviewed Labs: ordered.  Risk Prescription drug management.        Final diagnoses:  Sinusitis, unspecified chronicity, unspecified location    ED Discharge Orders          Ordered    doxycycline  (VIBRAMYCIN ) 100 MG capsule  2 times daily        02/08/24 1844               Ula Prentice SAUNDERS, MD 02/08/24 1849  "

## 2024-02-08 NOTE — ED Notes (Signed)
 Awaiting patient from lobby.
